# Patient Record
Sex: Female | Born: 1950
Health system: Southern US, Community
[De-identification: ages and names within clinical notes are randomized; demographics above are authoritative.]

## PROBLEM LIST (undated history)

## (undated) DIAGNOSIS — E7800 Pure hypercholesterolemia, unspecified: Secondary | ICD-10-CM

## (undated) DIAGNOSIS — M869 Osteomyelitis, unspecified: Secondary | ICD-10-CM

## (undated) DIAGNOSIS — Z9289 Personal history of other medical treatment: Secondary | ICD-10-CM

## (undated) DIAGNOSIS — R188 Other ascites: Secondary | ICD-10-CM

## (undated) DIAGNOSIS — I219 Acute myocardial infarction, unspecified: Secondary | ICD-10-CM

## (undated) DIAGNOSIS — B958 Unspecified staphylococcus as the cause of diseases classified elsewhere: Secondary | ICD-10-CM

## (undated) DIAGNOSIS — I739 Peripheral vascular disease, unspecified: Secondary | ICD-10-CM

## (undated) DIAGNOSIS — K219 Gastro-esophageal reflux disease without esophagitis: Secondary | ICD-10-CM

## (undated) DIAGNOSIS — I499 Cardiac arrhythmia, unspecified: Secondary | ICD-10-CM

## (undated) DIAGNOSIS — S42309A Unspecified fracture of shaft of humerus, unspecified arm, initial encounter for closed fracture: Secondary | ICD-10-CM

## (undated) DIAGNOSIS — M1990 Unspecified osteoarthritis, unspecified site: Secondary | ICD-10-CM

## (undated) DIAGNOSIS — K709 Alcoholic liver disease, unspecified: Secondary | ICD-10-CM

## (undated) DIAGNOSIS — K859 Acute pancreatitis without necrosis or infection, unspecified: Secondary | ICD-10-CM

## (undated) DIAGNOSIS — I10 Essential (primary) hypertension: Secondary | ICD-10-CM

## (undated) HISTORY — PX: ARTHROSCOPIC REPAIR ACL: SUR80

## (undated) HISTORY — PX: AMPUTATION: SHX166

## (undated) HISTORY — PX: LEG SURGERY: SHX1003

## (undated) HISTORY — PX: BUNIONECTOMY: SHX129

## (undated) HISTORY — DX: Unspecified fracture of shaft of humerus, unspecified arm, initial encounter for closed fracture: S42.309A

## (undated) HISTORY — DX: Acute pancreatitis without necrosis or infection, unspecified: K85.90

## (undated) HISTORY — PX: JOINT REPLACEMENT: SHX530

## (undated) HISTORY — PX: TOTAL HIP ARTHROPLASTY: SHX124

---

## 1978-04-18 HISTORY — PX: TUBAL LIGATION: SHX77

## 2000-04-20 ENCOUNTER — Emergency Department (HOSPITAL_COMMUNITY): Admission: EM | Admit: 2000-04-20 | Discharge: 2000-04-20 | Payer: Self-pay | Admitting: Emergency Medicine

## 2001-11-06 ENCOUNTER — Emergency Department (HOSPITAL_COMMUNITY): Admission: EM | Admit: 2001-11-06 | Discharge: 2001-11-07 | Payer: Self-pay | Admitting: Emergency Medicine

## 2002-08-16 ENCOUNTER — Encounter: Payer: Self-pay | Admitting: Orthopedic Surgery

## 2002-08-21 ENCOUNTER — Encounter: Payer: Self-pay | Admitting: Orthopedic Surgery

## 2002-08-21 ENCOUNTER — Inpatient Hospital Stay (HOSPITAL_COMMUNITY): Admission: RE | Admit: 2002-08-21 | Discharge: 2002-08-30 | Payer: Self-pay | Admitting: Orthopedic Surgery

## 2003-07-10 ENCOUNTER — Encounter: Admission: RE | Admit: 2003-07-10 | Discharge: 2003-07-10 | Payer: Self-pay | Admitting: *Deleted

## 2004-03-25 ENCOUNTER — Ambulatory Visit (HOSPITAL_BASED_OUTPATIENT_CLINIC_OR_DEPARTMENT_OTHER): Admission: RE | Admit: 2004-03-25 | Discharge: 2004-03-25 | Payer: Self-pay | Admitting: Orthopedic Surgery

## 2004-03-25 ENCOUNTER — Ambulatory Visit (HOSPITAL_COMMUNITY): Admission: RE | Admit: 2004-03-25 | Discharge: 2004-03-25 | Payer: Self-pay | Admitting: Orthopedic Surgery

## 2004-04-22 ENCOUNTER — Emergency Department (HOSPITAL_COMMUNITY): Admission: EM | Admit: 2004-04-22 | Discharge: 2004-04-22 | Payer: Self-pay | Admitting: Family Medicine

## 2005-09-05 ENCOUNTER — Ambulatory Visit: Payer: Self-pay | Admitting: Internal Medicine

## 2005-09-08 ENCOUNTER — Ambulatory Visit: Payer: Self-pay | Admitting: Internal Medicine

## 2005-09-08 ENCOUNTER — Other Ambulatory Visit: Admission: RE | Admit: 2005-09-08 | Discharge: 2005-09-08 | Payer: Self-pay | Admitting: Internal Medicine

## 2005-09-08 ENCOUNTER — Encounter: Payer: Self-pay | Admitting: Internal Medicine

## 2005-09-21 ENCOUNTER — Ambulatory Visit: Payer: Self-pay | Admitting: Gastroenterology

## 2005-10-03 ENCOUNTER — Ambulatory Visit: Payer: Self-pay

## 2005-10-03 ENCOUNTER — Encounter: Payer: Self-pay | Admitting: Cardiology

## 2005-10-03 ENCOUNTER — Ambulatory Visit: Payer: Self-pay | Admitting: Internal Medicine

## 2005-10-04 ENCOUNTER — Encounter: Payer: Self-pay | Admitting: Internal Medicine

## 2005-10-05 ENCOUNTER — Ambulatory Visit: Payer: Self-pay | Admitting: Gastroenterology

## 2005-10-05 ENCOUNTER — Encounter (INDEPENDENT_AMBULATORY_CARE_PROVIDER_SITE_OTHER): Payer: Self-pay | Admitting: Specialist

## 2005-10-05 ENCOUNTER — Encounter: Payer: Self-pay | Admitting: Internal Medicine

## 2005-11-23 ENCOUNTER — Encounter: Admission: RE | Admit: 2005-11-23 | Discharge: 2005-11-23 | Payer: Self-pay | Admitting: Orthopedic Surgery

## 2006-05-09 ENCOUNTER — Encounter: Admission: RE | Admit: 2006-05-09 | Discharge: 2006-05-09 | Payer: Self-pay | Admitting: Orthopedic Surgery

## 2006-06-19 ENCOUNTER — Encounter: Admission: RE | Admit: 2006-06-19 | Discharge: 2006-06-19 | Payer: Self-pay | Admitting: Orthopedic Surgery

## 2006-08-10 ENCOUNTER — Ambulatory Visit: Payer: Self-pay | Admitting: Internal Medicine

## 2007-01-02 ENCOUNTER — Encounter: Payer: Self-pay | Admitting: Internal Medicine

## 2007-01-05 ENCOUNTER — Encounter: Payer: Self-pay | Admitting: Internal Medicine

## 2007-01-23 ENCOUNTER — Encounter: Payer: Self-pay | Admitting: Internal Medicine

## 2007-02-07 ENCOUNTER — Ambulatory Visit: Payer: Self-pay | Admitting: Internal Medicine

## 2007-02-07 DIAGNOSIS — M1990 Unspecified osteoarthritis, unspecified site: Secondary | ICD-10-CM | POA: Insufficient documentation

## 2007-02-07 DIAGNOSIS — M810 Age-related osteoporosis without current pathological fracture: Secondary | ICD-10-CM

## 2007-02-07 DIAGNOSIS — E785 Hyperlipidemia, unspecified: Secondary | ICD-10-CM

## 2007-02-07 DIAGNOSIS — K219 Gastro-esophageal reflux disease without esophagitis: Secondary | ICD-10-CM

## 2007-02-07 DIAGNOSIS — R945 Abnormal results of liver function studies: Secondary | ICD-10-CM

## 2007-02-13 ENCOUNTER — Encounter: Payer: Self-pay | Admitting: Internal Medicine

## 2007-02-21 ENCOUNTER — Other Ambulatory Visit: Admission: RE | Admit: 2007-02-21 | Discharge: 2007-02-21 | Payer: Self-pay | Admitting: Internal Medicine

## 2007-02-21 ENCOUNTER — Encounter: Payer: Self-pay | Admitting: Internal Medicine

## 2007-02-21 ENCOUNTER — Ambulatory Visit: Payer: Self-pay | Admitting: Internal Medicine

## 2007-02-22 ENCOUNTER — Ambulatory Visit: Payer: Self-pay | Admitting: Cardiology

## 2007-03-01 LAB — CONVERTED CEMR LAB: Calcium, Total (PTH): 9.8 mg/dL (ref 8.4–10.5)

## 2007-03-02 ENCOUNTER — Encounter (INDEPENDENT_AMBULATORY_CARE_PROVIDER_SITE_OTHER): Payer: Self-pay | Admitting: *Deleted

## 2007-03-02 LAB — CONVERTED CEMR LAB
ALT: 51 units/L — ABNORMAL HIGH (ref 0–35)
AST: 178 units/L — ABNORMAL HIGH (ref 0–37)
BUN: 9 mg/dL (ref 6–23)
Basophils Relative: 0.3 % (ref 0.0–1.0)
CO2: 30 meq/L (ref 19–32)
Chloride: 102 meq/L (ref 96–112)
Creatinine, Ser: 0.6 mg/dL (ref 0.4–1.2)
Eosinophils Absolute: 0.1 10*3/uL (ref 0.0–0.6)
Eosinophils Relative: 2.2 % (ref 0.0–5.0)
GFR calc Af Amer: 134 mL/min
HCT: 42.1 % (ref 36.0–46.0)
HDL: 128.6 mg/dL (ref >39.0)
Lymphocytes Relative: 16.4 % (ref 12.0–46.0)
MCV: 104.3 fL — ABNORMAL HIGH (ref 78.0–100.0)
Monocytes Relative: 9.1 % (ref 3.0–11.0)
Neutrophils Relative %: 72 % (ref 43.0–77.0)
Platelets: 143 10*3/uL — ABNORMAL LOW (ref 150–400)
RBC: 4.04 M/uL (ref 3.87–5.11)
Sodium: 143 meq/L (ref 135–145)
Total CHOL/HDL Ratio: 2.7
WBC: 4.5 10*3/uL (ref 4.5–10.5)

## 2007-03-06 ENCOUNTER — Encounter: Payer: Self-pay | Admitting: Cardiology

## 2007-03-06 ENCOUNTER — Ambulatory Visit: Payer: Self-pay

## 2007-03-12 ENCOUNTER — Ambulatory Visit: Payer: Self-pay | Admitting: Cardiology

## 2007-03-20 ENCOUNTER — Ambulatory Visit: Payer: Self-pay

## 2007-03-20 LAB — CONVERTED CEMR LAB
AST: 60 units/L — ABNORMAL HIGH (ref 0–37)
Albumin: 3.8 g/dL (ref 3.5–5.2)
Alkaline Phosphatase: 99 units/L (ref 39–117)
Bilirubin, Direct: 0.1 mg/dL (ref 0.0–0.3)
Total Bilirubin: 0.7 mg/dL (ref 0.3–1.2)

## 2007-03-23 ENCOUNTER — Ambulatory Visit: Payer: Self-pay | Admitting: Cardiology

## 2007-03-23 LAB — CONVERTED CEMR LAB
ALT: 42 units/L — ABNORMAL HIGH (ref 0–35)
AST: 76 units/L — ABNORMAL HIGH (ref 0–37)
Alkaline Phosphatase: 115 units/L (ref 39–117)
Total Bilirubin: 0.7 mg/dL (ref 0.3–1.2)
Total Protein: 7.9 g/dL (ref 6.0–8.3)

## 2007-06-14 ENCOUNTER — Telehealth: Payer: Self-pay | Admitting: Internal Medicine

## 2007-07-13 ENCOUNTER — Encounter: Payer: Self-pay | Admitting: Internal Medicine

## 2007-11-07 ENCOUNTER — Telehealth: Payer: Self-pay | Admitting: Internal Medicine

## 2008-01-26 ENCOUNTER — Inpatient Hospital Stay (HOSPITAL_COMMUNITY): Admission: EM | Admit: 2008-01-26 | Discharge: 2008-01-30 | Payer: Self-pay | Admitting: Emergency Medicine

## 2008-01-26 ENCOUNTER — Ambulatory Visit: Payer: Self-pay | Admitting: Critical Care Medicine

## 2008-01-29 ENCOUNTER — Ambulatory Visit: Payer: Self-pay | Admitting: Infectious Diseases

## 2008-02-08 ENCOUNTER — Encounter: Payer: Self-pay | Admitting: Internal Medicine

## 2008-02-12 ENCOUNTER — Ambulatory Visit: Payer: Self-pay | Admitting: Internal Medicine

## 2008-02-13 ENCOUNTER — Ambulatory Visit: Payer: Self-pay | Admitting: Pulmonary Disease

## 2008-02-13 ENCOUNTER — Ambulatory Visit: Payer: Self-pay | Admitting: Internal Medicine

## 2008-02-19 ENCOUNTER — Telehealth (INDEPENDENT_AMBULATORY_CARE_PROVIDER_SITE_OTHER): Payer: Self-pay | Admitting: *Deleted

## 2008-02-19 LAB — CONVERTED CEMR LAB
Basophils Absolute: 0.1 10*3/uL (ref 0.0–0.1)
Basophils Relative: 2 % (ref 0.0–3.0)
Eosinophils Absolute: 0.1 10*3/uL (ref 0.0–0.7)
Lymphocytes Relative: 25.5 % (ref 12.0–46.0)
Monocytes Absolute: 2.5 10*3/uL — ABNORMAL HIGH (ref 0.1–1.0)
Monocytes Relative: 55.1 % — ABNORMAL HIGH (ref 3.0–12.0)
RBC: 3.67 M/uL — ABNORMAL LOW (ref 3.87–5.11)
RDW: 13.2 % (ref 11.5–14.6)

## 2008-03-10 ENCOUNTER — Ambulatory Visit: Payer: Self-pay | Admitting: Internal Medicine

## 2008-03-10 LAB — CONVERTED CEMR LAB
Bilirubin Urine: NEGATIVE
Glucose, Urine, Semiquant: NEGATIVE
Protein, U semiquant: 100
Specific Gravity, Urine: 1.015
Urobilinogen, UA: >=8
pH: 6.5

## 2008-03-11 ENCOUNTER — Ambulatory Visit: Payer: Self-pay | Admitting: Internal Medicine

## 2008-03-11 ENCOUNTER — Telehealth (INDEPENDENT_AMBULATORY_CARE_PROVIDER_SITE_OTHER): Payer: Self-pay | Admitting: *Deleted

## 2008-03-11 LAB — CONVERTED CEMR LAB
ALT: 16 units/L (ref 0–35)
Albumin: 3.7 g/dL (ref 3.5–5.2)
Amylase: 418 units/L — ABNORMAL HIGH (ref 27–131)
BUN: 14 mg/dL (ref 6–23)
Basophils Relative: 0 % (ref 0.0–3.0)
Eosinophils Absolute: 0 10*3/uL (ref 0.0–0.7)
Eosinophils Relative: 0.5 % (ref 0.0–5.0)
HCT: 39.8 % (ref 36.0–46.0)
Hemoglobin: 13.4 g/dL (ref 12.0–15.0)
Lipase: 204 units/L — ABNORMAL HIGH (ref 11.0–59.0)
MCV: 107.9 fL — ABNORMAL HIGH (ref 78.0–100.0)
Monocytes Absolute: 0.8 10*3/uL (ref 0.1–1.0)
Monocytes Relative: 9.2 % (ref 3.0–12.0)
Neutro Abs: 6.4 10*3/uL (ref 1.4–7.7)
Platelets: 141 10*3/uL — ABNORMAL LOW (ref 150–400)
Total Protein: 8.4 g/dL — ABNORMAL HIGH (ref 6.0–8.3)
WBC: 8.2 10*3/uL (ref 4.5–10.5)

## 2008-03-12 ENCOUNTER — Ambulatory Visit: Payer: Self-pay | Admitting: Cardiovascular Disease

## 2008-03-12 ENCOUNTER — Ambulatory Visit (HOSPITAL_COMMUNITY): Admission: RE | Admit: 2008-03-12 | Discharge: 2008-03-12 | Payer: Self-pay | Admitting: Internal Medicine

## 2008-03-12 ENCOUNTER — Telehealth: Payer: Self-pay | Admitting: Internal Medicine

## 2008-03-17 ENCOUNTER — Telehealth: Payer: Self-pay | Admitting: Internal Medicine

## 2008-03-17 ENCOUNTER — Ambulatory Visit: Payer: Self-pay | Admitting: Internal Medicine

## 2008-03-17 ENCOUNTER — Telehealth (INDEPENDENT_AMBULATORY_CARE_PROVIDER_SITE_OTHER): Payer: Self-pay | Admitting: *Deleted

## 2008-03-17 DIAGNOSIS — K859 Acute pancreatitis without necrosis or infection, unspecified: Secondary | ICD-10-CM | POA: Insufficient documentation

## 2008-03-17 LAB — CONVERTED CEMR LAB
Bilirubin Urine: NEGATIVE
Nitrite: NEGATIVE
Protein, U semiquant: NEGATIVE
Urobilinogen, UA: NEGATIVE

## 2008-03-18 ENCOUNTER — Encounter: Payer: Self-pay | Admitting: Internal Medicine

## 2008-03-18 ENCOUNTER — Encounter (INDEPENDENT_AMBULATORY_CARE_PROVIDER_SITE_OTHER): Payer: Self-pay | Admitting: *Deleted

## 2008-03-18 LAB — CONVERTED CEMR LAB
Bilirubin Urine: NEGATIVE
Hemoglobin, Urine: NEGATIVE
Ketones, ur: NEGATIVE mg/dL
Specific Gravity, Urine: 1.022 (ref 1.005–1.03)
Urobilinogen, UA: 1 (ref 0.0–1.0)

## 2008-03-21 ENCOUNTER — Telehealth (INDEPENDENT_AMBULATORY_CARE_PROVIDER_SITE_OTHER): Payer: Self-pay | Admitting: *Deleted

## 2008-03-21 LAB — CONVERTED CEMR LAB
ALT: 25 units/L (ref 0–35)
AST: 69 units/L — ABNORMAL HIGH (ref 0–37)
Amylase: 202 units/L — ABNORMAL HIGH (ref 27–131)
Total Bilirubin: 0.5 mg/dL (ref 0.3–1.2)
Total Protein: 7.5 g/dL (ref 6.0–8.3)

## 2008-04-03 ENCOUNTER — Ambulatory Visit: Payer: Self-pay | Admitting: Gastroenterology

## 2008-04-04 LAB — CONVERTED CEMR LAB
AST: 113 units/L — ABNORMAL HIGH (ref 0–37)
Albumin: 4 g/dL (ref 3.5–5.2)
Alkaline Phosphatase: 104 units/L (ref 39–117)
Cholesterol: 309 mg/dL (ref 0–200)
Direct LDL: 178 mg/dL
Total Protein: 8.1 g/dL (ref 6.0–8.3)
Triglycerides: 145 mg/dL (ref 0–149)
VLDL: 29 mg/dL (ref 0–40)

## 2008-04-08 ENCOUNTER — Telehealth (INDEPENDENT_AMBULATORY_CARE_PROVIDER_SITE_OTHER): Payer: Self-pay | Admitting: *Deleted

## 2008-04-25 ENCOUNTER — Telehealth: Payer: Self-pay | Admitting: Internal Medicine

## 2008-04-26 ENCOUNTER — Ambulatory Visit: Payer: Self-pay | Admitting: Internal Medicine

## 2008-04-26 ENCOUNTER — Inpatient Hospital Stay (HOSPITAL_COMMUNITY): Admission: EM | Admit: 2008-04-26 | Discharge: 2008-05-02 | Payer: Self-pay | Admitting: Emergency Medicine

## 2008-05-06 ENCOUNTER — Ambulatory Visit: Payer: Self-pay | Admitting: Internal Medicine

## 2008-05-20 ENCOUNTER — Ambulatory Visit: Payer: Self-pay | Admitting: Gastroenterology

## 2008-06-05 ENCOUNTER — Telehealth: Payer: Self-pay | Admitting: Gastroenterology

## 2008-06-10 ENCOUNTER — Telehealth (INDEPENDENT_AMBULATORY_CARE_PROVIDER_SITE_OTHER): Payer: Self-pay | Admitting: *Deleted

## 2008-06-12 ENCOUNTER — Ambulatory Visit: Payer: Self-pay | Admitting: Internal Medicine

## 2008-06-12 DIAGNOSIS — G5690 Unspecified mononeuropathy of unspecified upper limb: Secondary | ICD-10-CM | POA: Insufficient documentation

## 2008-06-17 ENCOUNTER — Telehealth (INDEPENDENT_AMBULATORY_CARE_PROVIDER_SITE_OTHER): Payer: Self-pay | Admitting: *Deleted

## 2008-06-17 LAB — CONVERTED CEMR LAB
ALT: 60 units/L — ABNORMAL HIGH (ref 0–35)
AST: 90 units/L — ABNORMAL HIGH (ref 0–37)
Alkaline Phosphatase: 108 units/L (ref 39–117)
Bilirubin, Direct: 0.1 mg/dL (ref 0.0–0.3)
Total Bilirubin: 0.9 mg/dL (ref 0.3–1.2)

## 2008-09-03 ENCOUNTER — Encounter: Payer: Self-pay | Admitting: Internal Medicine

## 2009-03-03 ENCOUNTER — Telehealth (INDEPENDENT_AMBULATORY_CARE_PROVIDER_SITE_OTHER): Payer: Self-pay | Admitting: *Deleted

## 2009-06-11 ENCOUNTER — Encounter (INDEPENDENT_AMBULATORY_CARE_PROVIDER_SITE_OTHER): Payer: Self-pay | Admitting: *Deleted

## 2009-07-23 ENCOUNTER — Telehealth (INDEPENDENT_AMBULATORY_CARE_PROVIDER_SITE_OTHER): Payer: Self-pay | Admitting: *Deleted

## 2009-08-17 ENCOUNTER — Ambulatory Visit: Payer: Self-pay | Admitting: Internal Medicine

## 2009-08-17 DIAGNOSIS — M4120 Other idiopathic scoliosis, site unspecified: Secondary | ICD-10-CM | POA: Insufficient documentation

## 2009-08-20 ENCOUNTER — Encounter: Payer: Self-pay | Admitting: Internal Medicine

## 2009-09-16 ENCOUNTER — Other Ambulatory Visit: Admission: RE | Admit: 2009-09-16 | Discharge: 2009-09-16 | Payer: Self-pay | Admitting: Internal Medicine

## 2009-09-16 ENCOUNTER — Ambulatory Visit: Payer: Self-pay | Admitting: Internal Medicine

## 2009-09-16 DIAGNOSIS — I10 Essential (primary) hypertension: Secondary | ICD-10-CM | POA: Insufficient documentation

## 2009-09-16 LAB — CONVERTED CEMR LAB
ALT: 23 units/L (ref 0–35)
AST: 53 units/L — ABNORMAL HIGH (ref 0–37)
Albumin: 3.8 g/dL (ref 3.5–5.2)
Alkaline Phosphatase: 189 units/L — ABNORMAL HIGH (ref 39–117)
BUN: 10 mg/dL (ref 6–23)
Basophils Absolute: 0.1 10*3/uL (ref 0.0–0.1)
Basophils Relative: 1.9 % (ref 0.0–3.0)
Bilirubin, Direct: 0.5 mg/dL — ABNORMAL HIGH (ref 0.0–0.3)
CO2: 30 meq/L (ref 19–32)
Calcium: 9.1 mg/dL (ref 8.4–10.5)
Chloride: 103 meq/L (ref 96–112)
Cholesterol: 242 mg/dL — ABNORMAL HIGH (ref 0–200)
Creatinine, Ser: 0.5 mg/dL (ref 0.4–1.2)
Direct LDL: 148.6 mg/dL
Eosinophils Absolute: 0.1 10*3/uL (ref 0.0–0.7)
Eosinophils Relative: 1.6 % (ref 0.0–5.0)
GFR calc non Af Amer: 131.91 mL/min (ref >60)
Glucose, Bld: 84 mg/dL (ref 70–99)
HCT: 41.1 % (ref 36.0–46.0)
HDL: 90.1 mg/dL (ref >39.00)
Hemoglobin: 14.2 g/dL (ref 12.0–15.0)
Lymphocytes Relative: 27.2 % (ref 12.0–46.0)
Lymphs Abs: 1.4 10*3/uL (ref 0.7–4.0)
MCHC: 34.6 g/dL (ref 30.0–36.0)
MCV: 116 fL — ABNORMAL HIGH (ref 78.0–100.0)
Monocytes Absolute: 1.5 10*3/uL — ABNORMAL HIGH (ref 0.1–1.0)
Monocytes Relative: 29.5 % — ABNORMAL HIGH (ref 3.0–12.0)
Neutro Abs: 2.1 10*3/uL (ref 1.4–7.7)
Neutrophils Relative %: 39.8 % — ABNORMAL LOW (ref 43.0–77.0)
Platelets: 166 10*3/uL (ref 150.0–400.0)
Potassium: 2.9 meq/L — ABNORMAL LOW (ref 3.5–5.1)
RBC: 3.55 M/uL — ABNORMAL LOW (ref 3.87–5.11)
RDW: 13.5 % (ref 11.5–14.6)
Sodium: 141 meq/L (ref 135–145)
TSH: 1.63 microintl units/mL (ref 0.35–5.50)
Total Bilirubin: 1.7 mg/dL — ABNORMAL HIGH (ref 0.3–1.2)
Total CHOL/HDL Ratio: 3
Total Protein: 7.7 g/dL (ref 6.0–8.3)
Triglycerides: 109 mg/dL (ref 0.0–149.0)
VLDL: 21.8 mg/dL (ref 0.0–40.0)
WBC: 5.2 10*3/uL (ref 4.5–10.5)

## 2009-09-18 ENCOUNTER — Telehealth (INDEPENDENT_AMBULATORY_CARE_PROVIDER_SITE_OTHER): Payer: Self-pay | Admitting: *Deleted

## 2009-09-21 ENCOUNTER — Ambulatory Visit: Payer: Self-pay | Admitting: Internal Medicine

## 2009-09-22 ENCOUNTER — Telehealth (INDEPENDENT_AMBULATORY_CARE_PROVIDER_SITE_OTHER): Payer: Self-pay | Admitting: *Deleted

## 2009-09-23 ENCOUNTER — Encounter: Payer: Self-pay | Admitting: Internal Medicine

## 2009-09-25 ENCOUNTER — Encounter (INDEPENDENT_AMBULATORY_CARE_PROVIDER_SITE_OTHER): Payer: Self-pay | Admitting: *Deleted

## 2009-09-25 LAB — CONVERTED CEMR LAB: Pap Smear: NEGATIVE

## 2009-10-02 ENCOUNTER — Encounter: Admission: RE | Admit: 2009-10-02 | Discharge: 2009-10-02 | Payer: Self-pay | Admitting: Internal Medicine

## 2009-10-08 ENCOUNTER — Telehealth (INDEPENDENT_AMBULATORY_CARE_PROVIDER_SITE_OTHER): Payer: Self-pay | Admitting: *Deleted

## 2009-10-26 ENCOUNTER — Telehealth (INDEPENDENT_AMBULATORY_CARE_PROVIDER_SITE_OTHER): Payer: Self-pay | Admitting: *Deleted

## 2009-11-30 ENCOUNTER — Encounter: Payer: Self-pay | Admitting: Internal Medicine

## 2009-11-30 ENCOUNTER — Encounter (INDEPENDENT_AMBULATORY_CARE_PROVIDER_SITE_OTHER): Payer: Self-pay | Admitting: *Deleted

## 2009-11-30 LAB — CONVERTED CEMR LAB
Glucose, Bld: 97 mg/dL
MCV: 107.9 fL
Platelets: 104 10*3/uL
WBC, blood: 5.7 10*3/uL

## 2009-12-03 ENCOUNTER — Encounter: Payer: Self-pay | Admitting: Internal Medicine

## 2010-01-08 ENCOUNTER — Encounter: Payer: Self-pay | Admitting: Internal Medicine

## 2010-01-22 ENCOUNTER — Encounter (INDEPENDENT_AMBULATORY_CARE_PROVIDER_SITE_OTHER): Payer: Self-pay | Admitting: *Deleted

## 2010-05-20 NOTE — Progress Notes (Signed)
Summary: Paperwork Status  Phone Note Other Incoming   Caller: Wilhemina Cash from [employer] of Call: Aurea Graff is calling checking on papework status for Aariah's power wheel chair Initial call taken by: Barnie Mort,  July 23, 2009 1:49 PM  Follow-up for Phone Call        Spoke with Aurea Graff informed the status is' pt has to have office visit and is scheduled on 08/10/09 with Dr Drue Novel .Kandice Hams  July 23, 2009 3:38 PM  Follow-up by: Kandice Hams,  July 23, 2009 3:38 PM

## 2010-05-20 NOTE — Letter (Signed)
Summary: Appointment - Missed  [facility]     [place], [place]    Phone:   Fax:      June 11, 2009 MRN: [id number]   [facility] [address] [place], [place]  [address]   Dear Ms. Motyka,  Our records indicate you missed your appointment on  06-09-2009  with  Dr. Antoine Poche  It is very important that we reach you to reschedule this appointment. We look forward to participating in your health care needs. Please contact us at the number listed above at your earliest convenience to reschedule this appointment.     Sincerely,       Lorne Skeens  [facility] Scheduling Team

## 2010-05-20 NOTE — Letter (Signed)
Summary: Cancer Screening/[employer] Personalized Risk Profile  Cancer Screening/[employer] Personalized Risk Profile   Imported By: Lanelle Bal 09/24/2009 12:27:31  _____________________________________________________________________  External Attachment:    Type:   Image     Comment:   External Document

## 2010-05-20 NOTE — Progress Notes (Signed)
Summary: abnormal labs, low vitamin D. Needs MRI  Phone Note Outgoing Call   Summary of Call:   LFTs slightly elevated in a obstructive pattern , different from previous increased LFTs chart is reviewed, she had a CT 04/2008: IMPRESSION:  Inflammatory changes and stranding in the central abdomen and surrounding the pancreas.  Findings are most consistent with acute  pancreatitis.    Patchy areas of low density within the hepatic parenchyma, probably  representing geographic fat infiltration.  There are focal areas of low density with a tubular configuration in the right hepatic lobe  and adjacent to the gallbladder.  The findings could represent additional areas of fat infiltration or focal biliary duct  dilatation.  These findings are indeterminate.  These liver  findings could be better evaluated with MRI.  PLAN, after an informal discussion with GI, I recommend: --schedule a MRI of abdomen and a MRCP , dx increased LFTs --  add a " carbohydrate deficient transferrin(CDT

## 2010-05-20 NOTE — Letter (Signed)
Summary: [facility]  [facility]   Imported By: Sherian Rein 12/25/2009 07:23:11  _____________________________________________________________________  External Attachment:    Type:   Image     Comment:   External Document  Appended Document: [facility] osteoprosis-- needs agresive RX, Forteo? also has abnormal, they were supposed to be faxed to [place] please get labs    Appended Document: [facility] Left message with [facility] to have labs faxed to [place].  Appended Document: [facility]  Waiting for labs from [facility], do you want [place] to rx forteo now or wait until you review labs? Army Fossa CMA  January 06, 2010 9:53 AM  wait until we can see the  labs  [place] E. Paz MD  January 11, 2010 11:41 AM   I called a requested labs from [facility] again. Army Fossa CMA  January 11, 2010 12:08 PM Labs on your ledge to review. Army Fossa CMA  January 11, 2010 2:22 PM  Labs reviewed CBC essentially at baseline Continue with the slight increase alkaline phosphate, recent MRI of the abdomen showed Smooth narrowing of the common duct, Pancreas was normal. Recommend: Recheck LFTs in 3 months Forteo prescription per [facility] let patient know  Jose E. Paz MD  January 21, 2010 1:55 PM      New/Updated Medications: FORTEO 600 MCG/2.4ML SOLN (TERIPARATIDE (RECOMBINANT)) daily  Pt states that she has been on the [facility] for 5 weeks now. Army Fossa CMA  January 21, 2010 2:32 PM

## 2010-05-20 NOTE — Miscellaneous (Signed)
Summary: labs from [facility] ortho   Clinical Lists Changes  Observations: Added new observation of ALK PHOS: 167 units/L (11/30/2009 11:07) Added new observation of SGOT (AST): 45 units/L (11/30/2009 11:07) Added new observation of GLUCOSE SER: 97 mg/dL (04/54/0981 19:14) Added new observation of CREATININE: 0.50 mg/dL (78/29/5621 30:86) Added new observation of SODIUM: 134 mmol/L (11/30/2009 11:07) Added new observation of PLATELETS: 104 10*3/mm3 (11/30/2009 11:07) Added new observation of MCV: 107.9 fL (11/30/2009 11:07) Added new observation of HGB: 15.9 g/dL (57/84/6962 95:28) Added new observation of WBC: 5.7 10*3/mm3 (11/30/2009 11:07)

## 2010-05-20 NOTE — Progress Notes (Signed)
Summary: labs  Phone Note Outgoing Call   Summary of Call: potassium is low, she is not on any medication that may account for that. she is asymptomatic Please call patient:  come back Monday for  a BMP--- Dx  hypokalemia Jose E. Paz MD  September 18, 2009 5:05 PM   Follow-up for Phone Call        spoke w/ patient aware of labs and appt scheduled for recheck on mon. also informed to eat plenty of potassium rich foods over the week........Marland KitchenDoristine Devoid  September 18, 2009 5:10 PM

## 2010-05-20 NOTE — Letter (Signed)
Summary: Revised Paperwork for [employer]  Revised Paperwork for [employer]   Imported By: Lanelle Bal 08/25/2009 12:07:46  _____________________________________________________________________  External Attachment:    Type:   Image     Comment:   External Document

## 2010-05-20 NOTE — Assessment & Plan Note (Signed)
Summary: CPX ---WILL EAT BREAKFAST BY 6AM, SO NEEDS FASTING LABS////SPH   Vital Signs:  Patient profile:   60 year old female Height:      60.5 inches Weight:      112 pounds Temp:     99.0 degrees F oral Pulse rate:   87 / minute Resp:     14 per minute BP sitting:   140 / 90  (left arm)  Vitals Entered By: Jeremy Johann CMA (September 16, 2009 1:44 PM) CC: yearly Comments --Fasting REVIEWED MED LIST, PATIENT AGREED DOSE AND INSTRUCTION CORRECT    History of Present Illness: yearly  , chart reviewed   Osteoporosis-- chart reviewed, last DEXA 2008, not on actonel (d/c by ortho, patient reports was d/c b/c it didn't help her osteoporosis)  Osteoarthritis-- no major problems w/ pain, scooter helps a lot   Hyperlipidemia-- on diet only   hypertension-- off BP meds x 5 days   Allergies (verified): No Known Drug Allergies  Past History:  Past Medical History: Reviewed history from 08/17/2009 and no changes required. Scoliosis Osteoporosis Osteoarthritis Hyperlipidemia GERD Pasteurella sepsis 10-09 pancreatitis 11-09  and 04-2008, suspect due to ETOH hypertension  Past Surgical History: Reviewed history from 08/17/2009 and no changes required. Tubal ligation born w/ a "dislocated hip" R hip surgery at 60 y/o L knee surgery at age 89 Hip replacement 2003 R 2 toes amputattion L foot metacarpal removal bunion surgery B R knee arthroscopy 2010  Family History: DM--no CAD-- bro colon ca--no breast ca--no ETOH: bro Lung ca-- M liver ca--bro kidney ca-- bro skin ca-- F Prostate Cancer: Father Stomach Cancer: Father    Social History: daughter of Baker Kogler, one of my patients Married (x 34 years in 2008) 2 kids Patient has never smoked.  Alcohol Use - yes,  "a little" Illicit Drug Use - no Patient gets regular exercise (swimming)  Review of Systems CV:  Denies chest pain or discomfort and swelling of feet. Resp:  Denies cough and shortness of  breath. GI:  Denies bloody stools, nausea, and vomiting. GU:  does SBE---> neg no vag d/c or bleed . Psych:  (+) stress , mom ill (cancer).  Physical Exam  General:  alert, well-developed, and well-nourished.   Neck:  no masses and no thyromegaly.   Breasts:  No mass, nodules, thickening, tenderness, bulging, retraction, inflamation, nipple discharge or skin changes noted.  no axillary adenopathy Lungs:  normal respiratory effort, no intercostal retractions, no accessory muscle use, and normal breath sounds.   Heart:  normal rate, regular rhythm, and no murmur.   Abdomen:  soft, non-tender, no distention, no masses, no guarding, and no rigidity.   Genitalia:  Normal introitus for age, no external lesions, clitoris unchanged from last exam no vaginal discharge, mucosa pink and moist, no vaginal or cervical lesions, no vaginal atrophy, no friaility or hemorrhage, small uterus size , normal position, no adnexal masses or tenderness Extremities:  no lower extremity edema   Impression & Recommendations:  Problem # 1:  HEALTH SCREENING (ICD-V70.0) Td -- aprox 2006 per patient  Cscope 10-2005--- normal MMG 5-10, neg ; due for a MMG, will wait for her to be notified  PAP 2008 neg, clitoris had an abnormal appearence, was refered toi gyn, they check and was told she was ok sending a Pap smear today Recommend self breast exam monthly    Problem # 2:  OSTEOPOROSIS (ICD-733.00)  chart reviewed, last DEXA 2008 not on actonel (d/c by ortho,  patient reports was d/c b/c it didn't help her osteoporosis) plan: recheck a DEXA, referal to rheumatology? Her updated medication list for this problem includes:    Calcium 600/vitamin D 600-400 Mg-unit Tabs (Calcium carbonate-vitamin d) .Marland Kitchen... Take 1 tablet by mouth two times a day  Orders: TLB-TSH (Thyroid Stimulating Hormone) (84443-TSH) T-Vitamin D (25-Hydroxy) (52841-32440) Radiology Referral (Radiology)  Problem # 3:  HYPERLIPIDEMIA  (ICD-272.4) labs . diet discussed  Labs Reviewed: SGOT: 90 (06/12/2008)   SGPT: 60 (06/12/2008)   HDL:110.0 (04/03/2008), 128.6 (02/21/2007)  LDL:DEL (04/03/2008), DEL (02/21/2007)  Chol:309 (04/03/2008), 344 (02/21/2007)  Trig:145 (04/03/2008), 140 (02/21/2007)  Orders: TLB-Lipid Panel (80061-LIPID)  Problem # 4:  LIVER FUNCTION TESTS, ABNORMAL (ICD-794.8) Assessment: Unchanged recheck  Orders: Venipuncture ([address]272) TLB-Hepatic/Liver Function Pnl (80076-HEPATIC)  Problem # 5:  ESSENTIAL HYPERTENSION (ICD-401.9) run out of medications 5 days ago RF meds Her updated medication list for this problem includes:    Metoprolol Succinate 25 Mg Xr24h-tab (Metoprolol succinate) .Marland Kitchen... 1 by mouth daily  BP today: 140/90 Prior BP: 124/78 (08/17/2009)  Labs Reviewed: K+: 3.6 (02/12/2008) Creat: : 0.6 (03/11/2008)   Chol: 309 (04/03/2008)   HDL: 110.0 (04/03/2008)   LDL: DEL (04/03/2008)   TG: 145 (04/03/2008)  Orders: TLB-BMP (Basic Metabolic Panel-BMET) (80048-METABOL) TLB-CBC Platelet - w/Differential (85025-CBCD)  Complete Medication List: 1)  Metoprolol Succinate 25 Mg Xr24h-tab (Metoprolol succinate) .Marland Kitchen.. 1 by mouth daily 2)  Calcium 600/vitamin D 600-400 Mg-unit Tabs (Calcium carbonate-vitamin d) .... Take 1 tablet by mouth two times a day 3)  Methocarbamol 500 Mg Tabs (Methocarbamol) .... Take 1 tab every 6 to 8 hours as needed  Patient Instructions: 1)  Please schedule a follow-up appointment in 6 months .  Prescriptions: METOPROLOL SUCCINATE 25 MG XR24H-TAB (METOPROLOL SUCCINATE) 1 by mouth daily  #90 x 3   Entered and Authorized by:   Nolon Rod. Teran Knittle MD   Signed by:   Nolon Rod. Cadan Maggart MD on 09/16/2009   Method used:   Electronically to        [facility]  [address] [id number]       [address]       [place], [place]  [address]       Ph: [phone] or [phone]       Fax: [phone]   RxID:   [id number]

## 2010-05-20 NOTE — Letter (Signed)
Summary: [employer]   Imported By: Lanelle Bal 08/24/2009 14:08:55  _____________________________________________________________________  External Attachment:    Type:   Image     Comment:   External Document

## 2010-05-20 NOTE — Progress Notes (Signed)
Summary: need labs (no answer 7/11)  Phone Note Outgoing Call   Summary of Call: advised patient-- the patient is still need labs LFTs ( full LFTs not just AST ALT) and GGT  dx abnormal liver function tests  Jose E. Paz MD  October 26, 2009 8:40 AM    Follow-up for Phone Call        Tried calling pt,  no answer. Army Fossa CMA  October 26, 2009 10:41 AM   Additional Follow-up for Phone Call Additional follow up Details #1::        Tried to call patient but her voicemail can not receive messages Additional Follow-up by: Lavell Islam,  October 27, 2009 2:08 PM    Additional Follow-up for Phone Call Additional follow up Details #2::    mailed letter.Karoline Caldwell Negrete  October 30, 2009 8:59 AM

## 2010-05-20 NOTE — Letter (Signed)
Summary: Results Follow up Letter   at [place]  [address] [place], [place] [address]   Phone: [phone]  Fax: [phone]    09/25/2009 MRN: [id number]  [facility] [address] [place], [place]  [address]  Dear Ms. Distler,  The following are the results of your recent test(s):  Test         Result    Pap Smear:        Normal __X___  Not Normal _____ Comments: ______________________________________________________ Cholesterol: LDL(Bad cholesterol):         Your goal is less than:         HDL (Good cholesterol):       Your goal is more than: Comments:  ______________________________________________________ Mammogram:        Normal _____  Not Normal _____ Comments:  ___________________________________________________________________ Hemoccult:        Normal _____  Not normal _______ Comments:    _____________________________________________________________________ Other Tests:Please see attached normal pap smear    We routinely do not discuss normal results over the telephone.  If you desire a copy of the results, or you have any questions about this information we can discuss them at your next office visit.   Sincerely,

## 2010-05-20 NOTE — Progress Notes (Signed)
Summary: MRI result, repeat lab  Phone Note Outgoing Call   Call placed by: [employer] CMA,  October 08, 2009 5:30 PM Details for Reason: MRI discuss with radiology The narrowing of the common duct is very subtle, not really obstructive. no need for further MRIs unless something changes Plan: Advised patient, MRI is essentially okay please come back next week for a repeated LFTs ( full LFTs not just AST ALT) and GGT   Summary of Call: Tried to call pt mail box full tried work # was told wrong # will try home # again tomorrow......Marland KitchenFelecia Deloach CMA  October 08, 2009 5:32 PM   Follow-up for Phone Call        Advised pt of MRI result.  Lab appt scheduled for 10/12/09 @ 10:20.  Mervin Kung CMA  October 09, 2009 8:51 AM

## 2010-05-20 NOTE — Assessment & Plan Note (Signed)
Summary: ROB--TO GO PAPER WORK//PH   Vital Signs:  Patient profile:   60 year old female Height:      61 inches Weight:      113.4 pounds BMI:     21.50 Pulse rate:   64 / minute BP sitting:   124 / 78  Vitals Entered By: Shary Decamp (Aug 17, 2009 2:57 PM) CC: needs paperwork completed for mobility assistive equipment   History of Present Illness: needs paperwork for a scooter    Current Medications (verified): 1)  Metoprolol Succinate 25 Mg Xr24h-Tab (Metoprolol Succinate) .Marland Kitchen.. 1 By Mouth Daily 2)  Calcium 600/vitamin D 600-400 Mg-Unit Tabs (Calcium Carbonate-Vitamin D) .... Take 1 Tablet By Mouth Two Times A Day 3)  Hydrocodone .... Per Ortho 4)  Muscle Relaxer .... Per Ortho  Allergies (verified): No Known Drug Allergies  Past History:  Past Medical History: Scoliosis Osteoporosis Osteoarthritis Hyperlipidemia GERD Pasteurella sepsis 10-09 pancreatitis 11-09  and 04-2008, suspect due to ETOH hypertension  Past Surgical History: Tubal ligation born w/ a "dislocated hip" R hip surgery at 60 y/o L knee surgery at age 58 Hip replacement 2003 R 2 toes amputattion L foot metacarpal removal bunion surgery B R knee arthroscopy 2010  Social History: Reviewed history from 05/20/2008 and no changes required. Married (x 34 years in 2008) 2 kids Patient has never smoked.  Alcohol Use - yes  occasional social Illicit Drug Use - no Patient gets regular exercise.  Review of Systems       the patient has a long history for scoliosis, also OA, status-post multiple orthopedic procedures. Most recently she is having R knee pain and left leg numbness. She was prescribed a brace for her left leg but it  is very uncomfortable to use. until recently, her husband was helping her quite a bit but due to his own health issues he is not able to help her that much anymore.   She is unable to walk but a few steps before she is to stop due to pain,  this is limiting her  ADL  Physical Exam  General:  alert, well-developed, and well-nourished.   Lungs:  normal respiratory effort, no intercostal retractions, no accessory muscle use, and normal breath sounds.   Heart:  normal rate, regular rhythm, and no murmur.   Msk:  moderate to severe scoliosis noted, gait difficultted by scoliosis Extremities:  no edema   Impression & Recommendations:  Problem # 1:  SCOLIOSIS (ICD-737.30) I think she will benefit from a scooter due to her  scoliosis, OA paper work completed  Problem # 2:  OSTEOARTHRITIS (ICD-715.90) see #1  Problem # 3:   face-to-face more than 15  minutes completing a detailed  paperwork with the assistance of the patient  Complete Medication List: 1)  Metoprolol Succinate 25 Mg Xr24h-tab (Metoprolol succinate) .Marland Kitchen.. 1 by mouth daily 2)  Calcium 600/vitamin D 600-400 Mg-unit Tabs (Calcium carbonate-vitamin d) .... Take 1 tablet by mouth two times a day 3)  Hydrocodone  .... Per ortho 4)  Muscle Relaxer  .... Per ortho  Patient Instructions: 1)  came back for your physical at your convinience

## 2010-08-02 LAB — DIFFERENTIAL
Basophils Relative: 0 % (ref 0–1)
Eosinophils Absolute: 0 10*3/uL (ref 0.0–0.7)
Eosinophils Absolute: 0 10*3/uL (ref 0.0–0.7)
Eosinophils Relative: 0 % (ref 0–5)
Eosinophils Relative: 1 % (ref 0–5)
Lymphocytes Relative: 13 % (ref 12–46)
Lymphs Abs: 0.7 10*3/uL (ref 0.7–4.0)
Monocytes Absolute: 0.4 10*3/uL (ref 0.1–1.0)
Monocytes Relative: 8 % (ref 3–12)
Monocytes Relative: 9 % (ref 3–12)
Neutrophils Relative %: 80 % — ABNORMAL HIGH (ref 43–77)

## 2010-08-02 LAB — POCT I-STAT, CHEM 8
HCT: 43 % (ref 36.0–46.0)
Hemoglobin: 14.6 g/dL (ref 12.0–15.0)
Potassium: 3.2 mEq/L — ABNORMAL LOW (ref 3.5–5.1)
Sodium: 136 mEq/L (ref 135–145)
TCO2: 23 mmol/L (ref 0–100)

## 2010-08-02 LAB — COMPREHENSIVE METABOLIC PANEL
ALT: 20 U/L (ref 0–35)
AST: 44 U/L — ABNORMAL HIGH (ref 0–37)
Albumin: 2.7 g/dL — ABNORMAL LOW (ref 3.5–5.2)
Albumin: 3.3 g/dL — ABNORMAL LOW (ref 3.5–5.2)
Albumin: 3.5 g/dL (ref 3.5–5.2)
Alkaline Phosphatase: 86 U/L (ref 39–117)
BUN: 14 mg/dL (ref 6–23)
BUN: 7 mg/dL (ref 6–23)
CO2: 23 mEq/L (ref 19–32)
Calcium: 8.5 mg/dL (ref 8.4–10.5)
Calcium: 9 mg/dL (ref 8.4–10.5)
Calcium: 9.4 mg/dL (ref 8.4–10.5)
Chloride: 98 mEq/L (ref 96–112)
Creatinine, Ser: 0.57 mg/dL (ref 0.4–1.2)
Creatinine, Ser: 0.59 mg/dL (ref 0.4–1.2)
Creatinine, Ser: 0.64 mg/dL (ref 0.4–1.2)
GFR calc Af Amer: >60 mL/min (ref >60)
GFR calc non Af Amer: >60 mL/min (ref >60)
Glucose, Bld: 50 mg/dL — ABNORMAL LOW (ref 70–99)
Potassium: 3.7 mEq/L (ref 3.5–5.1)
Sodium: 138 mEq/L (ref 135–145)
Total Protein: 6 g/dL (ref 6.0–8.3)

## 2010-08-02 LAB — CBC
HCT: 36.2 % (ref 36.0–46.0)
HCT: 38.7 % (ref 36.0–46.0)
Hemoglobin: 12.4 g/dL (ref 12.0–15.0)
MCHC: 33.9 g/dL (ref 30.0–36.0)
MCHC: 34 g/dL (ref 30.0–36.0)
MCHC: 34.3 g/dL (ref 30.0–36.0)
MCV: 108.7 fL — ABNORMAL HIGH (ref 78.0–100.0)
MCV: 109.1 fL — ABNORMAL HIGH (ref 78.0–100.0)
Platelets: 80 10*3/uL — ABNORMAL LOW (ref 150–400)
Platelets: 83 10*3/uL — ABNORMAL LOW (ref 150–400)
Platelets: 92 10*3/uL — ABNORMAL LOW (ref 150–400)
RBC: 3.8 MIL/uL — ABNORMAL LOW (ref 3.87–5.11)
RDW: 17.7 % — ABNORMAL HIGH (ref 11.5–15.5)
WBC: 5.1 10*3/uL (ref 4.0–10.5)
WBC: 6.3 10*3/uL (ref 4.0–10.5)

## 2010-08-02 LAB — AMYLASE: Amylase: 149 U/L — ABNORMAL HIGH (ref 27–131)

## 2010-08-02 LAB — POCT CARDIAC MARKERS
CKMB, poc: <1 ng/mL — ABNORMAL LOW (ref 1.0–8.0)
Myoglobin, poc: 41 ng/mL (ref 12–200)

## 2010-08-02 LAB — LIPASE, BLOOD
Lipase: 1112 U/L — ABNORMAL HIGH (ref 11–59)
Lipase: 87 U/L — ABNORMAL HIGH (ref 11–59)

## 2010-08-31 NOTE — Discharge Summary (Signed)
[name], Kathryn [name]                   ACCOUNT NO.:  [id number]   MEDICAL RECORD NO.:  [id number]          PATIENT TYPE:  INP   LOCATION:  3705                         FACILITY:  [facility]   PHYSICIAN:  Valerie A. Felicity Coyer, MDDATE OF BIRTH:  07-27-1950   DATE OF ADMISSION:  04/26/2008  DATE OF DISCHARGE:  05/02/2008                               DISCHARGE SUMMARY   PRIMARY CARE PHYSICIAN:  Willow Ora, MD   DISCHARGE DIAGNOSIS:  Acute pancreatitis likely secondary to ethyl  alcohol use with no evidence of gallstones on CT.   HISTORY OF PRESENT ILLNESS:  [name] is a 60 year old white female  with past medical history of hypertension, hypertriglycerides, and daily  EtOH use who presented to [facility] on day of admission with  complaints of back pain, abdominal pain, and nausea and vomiting.  Of  note, the patient has been seen frequently by primary care physician of  recent due to similar symptoms and thought secondary to pancreatitis.  The patient does report she drinks approximately 2 drinks of vodka every  night.  Evaluation in the ER revealed a lipase of 1112.  The patient was  admitted at that time for further evaluation and treatment.   PAST MEDICAL HISTORY:  1. Hypertension.  2. 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  3. BTL in 1982.  4. Amputation of third left toe in 1998.  5. Daily EtOH use.  6. Hypertriglyceridemia.  7. Fatty liver disease.  8. Osteoporosis.  9. Osteoarthritis.  10.GERD.   COURSE OF HOSPITALIZATION:  Acute pancreatitis.  Again, the patient's  symptoms thought likely secondary to daily EtOH use, although the  patient does deny problem and refuses any help stating that it had been  a problem for her to quit drinking.  A CT of the abdomen and pelvis was  obtained consistent with acute pancreatitis, negative for free fluid in  the pelvis and negative for any evidence of gallstones.  The patient has  been advanced from clear  diet to regular diet without any recurrent  nausea or vomiting.  She does have some mild abdominal pain, however,  well controlled with narcotic pain medications.  The patient is to  continue PPI at the time of discharge as well as continue bland diet  until complete resolution of symptoms in regards to daily EtOH use.  The  patient has been instructed to abstain completely from alcohol.  She  showed no signs of withdrawal during this hospitalization.   MEDICATIONS AT THE TIME OF DISCHARGE:  1. Omeprazole 20 mg p.o. daily.  2. Oxycodone/APAP 5/325 one tablet p.o. q.6 h. p.r.n. pain.  3. Phenergan 12.5 mg 1 tablet p.o. q.8 h. p.r.n. nausea.  4. Metoprolol 12.5 mg p.o. daily.  5. Calcium 2 tablets p.o. daily.   DISPOSITION:  The patient felt medically stable for discharge home at  this time, as she is tolerating regular diet.  The patient is instructed  to follow up her primary care physician, Dr. Willow Ora on Tuesday May 06, 2008, at 10:40 a.m. for further discussion  on alcohol cessation as  well as possible referral to Gastroenterology.      Cordelia Pen, NP      Raenette Rover. Felicity Coyer, MD  Electronically Signed    LE/MEDQ  D:  05/02/2008  T:  05/02/2008  Job:  [id number]   cc:   Willow Ora, MD

## 2010-08-31 NOTE — Assessment & Plan Note (Signed)
[facility]                               LIPID CLINIC NOTE   NAME:[name], [name]                          MRN:          [id number]  DATE:03/12/2007                            DOB:          06-Mar-1951    The patient is seen in the lipid clinic for further evaluation and  medication titration associated with hyperlipidemia in the setting of  elevated liver enzymes.   PAST MEDICAL HISTORY:  1. Fatty liver disease.  2. Osteoporosis.  3. Osteoarthritis.  4. Hyperlipidemia.  5. Gastroesophageal reflux disease.   She recently received a prescription for simvastatin 40 mg one daily at  bedtime which she only took for one evening before she received a call  from Dr. Leta Jungling office that she should discontinue due to her increased  LFTs.  As a result currently her only medication is calcium with D 600  mg twice daily.   The patient has no known drug allergies.   SOCIAL HISTORY:  Pertinent for only 2nd hand smoke.  No previous use of  tobacco.  She states that she drinks on average 2 alcoholic beverages  each week to assist her with sleep.  Her diet is pretty good with  selections of lean meats that are often grilled, lots of salads, every  once in a while she will go to the [employer] for fettuccini with  cream sauce or fast food but she uses Smart Balance.  She uses oils only  sparingly and selects low fat milk, juice, or flavored water for  beverages.  She does some upper body exercise on a regular basis but has  been instructed by Dr. Drue Novel in the past to limit impact due to her hip  replacement and pain as well as her multiple lower extremity surgeries  and toe amputations.  She states that she does enjoy swimming but pool  access is limited and unaffordable at this time as she is on disability  and her husband is unemployed.  It appears that the patient's financial  barriers are her most significant impairment at this time.   FAMILY HISTORY:  With a  1st degree relative being her brother who had an  MI 27 years ago.   VITAL SIGNS:  Her weight today is 116 pounds.  Blood pressure is 146/82.  Her heart rate is 88.  Her respirations are 16.   LABORATORY:  On February 21, 2007, revealed a total cholesterol 344,  triglycerides 140, HDL 128.6, LDL 190.4.  ALT elevated at 51, AST  elevated at 178, alkaline phosphatase slightly elevated at 123.   It is of note that the patient has seen Dr. Jarold Motto in the past who  diagnosed fatty liver.   ASSESSMENT:  The patient is a 60 year old female referred for  hyperlipidemia in the setting of primary prevention.  Certainly she has  very significantly elevated AST and chronic liver abnormalities noted in  her standard panel.   PLAN:  1. I have spoken on the telephone with Dr. Vania Rea. Jarold Motto, who has  suggested that a thiazolidinedione such as Actos is appropriate in      this patient.  That there are studies that have been published most      recently in series in Gastroenterology that have demonstrated LFTs      improvement and histologic improvement associated with use of      thiazolidinedione in a fatty liver.  I have discussed this with the      patient and have agreed that I will obtain the citation, review it,      and then make a recommendation from there.  2. In the meantime, I have asked the patient to keep a 3-day food      diary, work to decrease her milk content from 2% to 1%, decrease      her breakfast meats, and switch from ground beef to low fat ground      [place].  3. I have asked her to discontinue her alcohol use completely in the      period as we begin to follow up on her liver function tests.  4. She will consider [employer] membership for pool access.  5. She will follow up with labs on December 1,2 008, and follow up      with me on March 23, 2007.   I appreciate the opportunity to see this pleasant patient.      Shelby Dubin, PharmD, BCPS, CPP  Electronically  Signed      Rollene Rotunda, MD, [facility]  Electronically Signed   MP/MedQ  DD: 03/13/2007  DT: 03/13/2007  Job #: [id number]   cc:   Willow Ora, MD  Rollene Rotunda, MD, [facility]

## 2010-08-31 NOTE — Discharge Summary (Signed)
NAMEIlean, Kathryn Kathryn                   ACCOUNT NO.:  [id number]   MEDICAL RECORD NO.:  [id number]          PATIENT TYPE:  INP   LOCATION:  5038                         FACILITY:  [facility]   PHYSICIAN:  Oretha Milch, MD      DATE OF BIRTH:  03-Sep-1950   DATE OF ADMISSION:  01/26/2008  DATE OF DISCHARGE:  01/30/2008                               DISCHARGE SUMMARY   DISCHARGE DIAGNOSES:  1. Pasturella bacteremia and uvulitis.  2. Hypokalemia.   HISTORY OF PRESENT ILLNESS:  Ms. Kathryn Kathryn is a 60 year old white female  who presented to the office and was seen by nurse practitioner, Tammy  Parrett, with sore throat and swollen uvula. She was admitted for  further evaluation and treatment of suspected uvulitis and rule out  retropharyngeal abscess.   LABORATORY DATA:  Is currently not available. Of note, her potassium was  noted to be 3.3, and she required potassium supplement prior to  discharge.   HOSPITAL COURSE BY DISCHARGE DIAGNOSES:  1. Pasturella bacteremia. Pasturella bacteremia was on blood cultures.      She had an ID consult with Dr. Sampson Goon. She was treated      initially with IV antibiotics and was changed to ciprofloxacin 500      mg b.i.d. at discharge to complete 10 days of antimicrobial      therapy. Her uvulitis improved. Her neck area was thoroughly      examined by Dr. Lazarus Salines with no further interventions. He will      follow up with the outpatient dose. She did have a second set of      blood cultures drawn prior to discharge.  2. Hypokalemia. It was repleted with 40 mEq prior to discharge.   DISCHARGE MEDICATIONS:  1. Metoprolol 12.5 mg 2 times a day.  2. Ibuprofen 800 mg q.i.d. p.r.n.  3. Ciprofloxacin 500 mg 1 tablet 2 times a day until gone.   She has got a follow-up appointment with Rubye Oaks, nurse  practitioner, on October 28 at 10 a.m. She was also instructed to follow  up with Dr. Lazarus Salines to continue examination of her throat. She should be  on a  heart health regular diet. She is being discharged in improved  condition.      Devra Dopp, MSN, ACNP      Oretha Milch, MD  Electronically Signed    SM/MEDQ  D:  01/31/2008  T:  01/31/2008  Job:  [id number]   cc:   Gloris Manchester. Lazarus Salines, M.D.  Mick Sell, MD

## 2010-08-31 NOTE — H&P (Signed)
NAMEMalaijah, [name]                   ACCOUNT NO.:  [id number]   MEDICAL RECORD NO.:  [id number]          PATIENT TYPE:  INP   LOCATION:  1827                         FACILITY:  [facility]   PHYSICIAN:  Donalynn Furlong, MD      DATE OF BIRTH:  07/13/1950   DATE OF ADMISSION:  04/26/2008  DATE OF DISCHARGE:                              HISTORY & PHYSICAL   PRIMARY CARE Aubryana Vittorio:  Dr. Willow Ora with  HealthCare   CHIEF COMPLAINT:  Nausea, vomiting, abdominal pain in epigastric region  with radiation to the back.   HISTORY OF PRESENT ILLNESS:  Ms. Burrough is a 60 year old Caucasian female  who lives in [place] with her husband.  She presented to [facility] tonight with a complaint of back pain which started about 2  days ago in the thoracic vertebral region.  She also complained that  initial back pain converted into the abdominal pain starting from her  lower chest wall to upper part of her abdomen in the epigastric region.  This abdominal pain is constant.  It is deep and associated with nausea  and vomiting which started today in the morning at 3:00 a.m.  It was  persistent for about 6 hours; then, the patient went to sleep, woke up  at around noontime.  She had food which again she threw up.  She decided  to present to the ER because she continued to have these symptoms.  She  denied any fever, chills, diarrhea, constipation, blood in the stool,  cough, sputum production, chest pain.  No history of coronary artery  disease or any sore throat, malaise, headache, urinary complaints at  this time.  Interestingly, the patient does have a history of alcohol  use every night.  She drinks 2 drinks of vodka every night.  She does  not smoke, and she does not have any illicit drug use.   PAST MEDICAL HISTORY:  1. Controlled hypertension on metoprolol.  2. Right hip replacement due to a history of congenital dysplasia of      right hip.  3. Bilateral tubal ligation in 1982.  4.  Amputation of third left toe in 1998.  5. Left knee surgery in 1965.  6. Left lower extremity neuropathy.  7. Daily alcohol use.  8. Hypertriglyceridemia.  9. Fatty liver disease.  10.Osteoporosis.  11.Osteoarthritis.  12.Gastroesophageal reflux disease.   PAST SURGICAL HISTORY:  As above.   FAMILY HISTORY:  She lives with her husband, and she does have daily  alcohol use, no tobacco use, but she is a second-hand smoker.  She has 2  grandsons, and she is disabled due to her hip dysplasia.   FAMILY HISTORY:  Father died at age 54 due to gastric cancer with  metastasis.  She has a brother who died at age 65 due to liver and  kidney cancer.   REVIEW OF SYSTEMS:  Positive as per HPI, otherwise negative.  Review of  systems done for 14 systems.   PHYSICAL EXAMINATION:  VITAL SIGNS:  Blood pressure 122/89, pulse 96,  respiration  15, temperature 98.7 on presentation in the ER with normal  oxygen saturation on room air.  GENERAL EXAM:  Alert, oriented x3, not in acute distress, sitting up in  the bed.  CARDIOVASCULAR:  S1-S2 regular.  No murmur, rub or gallop.  LUNGS:  Clear to auscultation bilaterally.  No wheezing, rhonchi,  crackles.  ABDOMEN:  There is severe tenderness in epigastric and bilateral upper  quadrants and in the periumbilical area.  She does have some tenderness  over the back also on both sides.  Due to tenderness, I could not  palpate any deep organs, no rebound or guarding, no abdominal muscle  rigidity noted on exam.  EXTREMITIES:  No clubbing, cyanosis or edema.  Pulses palpable in all 4  extremities.  HIPS:  The patient does have a right hip arthroplasty incision and  healed wound present.  LOWER EXTREMITIES:  She does have missing toes on her left foot due to  the previous surgery.  HEAD:  Normocephalic, nontraumatic.  Eyes:  Pupils are equally reactive  to light and accommodation.  Extraocular muscles are intact.  No  jaundice.  NECK:  No thyromegaly or  JVD.  ORAL CAVITY:  Oral mucosa moist.  No thrush noted.  The patient does  have dental caries.  No sore throat noted.  SKIN:  No rash or bruits.  NEUROLOGICAL:  Exam shows intact cranial nerves, muscular strength and  sensation.   LABORATORIES:  EKG with lateral T-wave abnormalities.  One set of  troponin is negative.  Chest x-ray, portable, is without any remarkable  cardiopulmonary disease.  Lipase 1112.  Comprehensive metabolic panel  normal except potassium 3.2, chloride 99, glucose 104, albumin 44, total  bilirubin 2.2.  CBC with differential shows platelet of 92, hemoglobin  4.1, WBC 5.1.  CT scan of abdomen and pelvis with and without contrast  is pending.   ASSESSMENT AND PLAN:  1. Acute pancreatitis most likely secondary to daily alcohol use      and/or hypertriglyceridemia (on recent lipid profile) and less      likely gallstone.  2. Hypokalemia  3. Controlled hypertension.  4. History of daily alcohol use.  5. Hypertriglyceridemia.  6. Gastroesophageal reflux disease.  7. Osteoporosis.  8. Congenital hip dysplasia on right side with right total hip      arthroplasty.   PLAN:  Will admit the patient on telemetry bed with a diagnosis of acute  pancreatitis under Dr. Rene Paci.  Primary care Ramatoulaye Pack is Dr.  Willow Ora.  The patient is full code.  The patient will be n.p.o. except  water, ice chips and medicines.  Will check vitals, input/output every 8  hours.  The patient will get CBC with differential, CMP, amylase, lipase  in the morning.  Will start IV normal saline at 75 mL per hour.  We will  replace her potassium with IV potassium 40 mEq over 4 hours.  Will give  her p.r.n. IV morphine for pain, p.r.n. IV Zofran and p.o. Phenergan for  nausea, vomiting if needed.  The patient will get CT abdomen and pelvis  with contrast at this time with antinausea medicine prior to the  contrast drinking.  We will continue home metoprolol at 12.5 mg p.o.  b.i.d. doses.   The patient is advised to quit drinking alcohol as it may  exacerbate her pancreatitis due to the alcohol use.  The  patient may need gastroenterology workup, but right now, the patient is  stable, and we will  continue to follow her.  The patient does not  require any other interventions like antibiotics at this time.  We will  provide her GI prophylaxis with Nexium p.o. and SCDs on the legs for DVT  prophylaxis.      Donalynn Furlong, MD  Electronically Signed     TVP/MEDQ  D:  04/26/2008  T:  04/26/2008  Job:  [id number]   cc:   Vikki Ports A. Felicity Coyer, MD  Willow Ora, MD

## 2010-08-31 NOTE — H&P (Signed)
NAMEAnaid, Haney Lara                   ACCOUNT NO.:  [id number]   MEDICAL RECORD NO.:  [id number]          PATIENT TYPE:  INP   LOCATION:  1824                         FACILITY:  [facility]   PHYSICIAN:  Charlcie Cradle. Delford Field, MD, FCCPDATE OF BIRTH:  April 18, 1951   DATE OF ADMISSION:  01/26/2008  DATE OF DISCHARGE:                              HISTORY & PHYSICAL   CHIEF COMPLAINT:  Can't swallow.   HISTORY OF PRESENT ILLNESS:  This is a 60 year old white female, a  primary care patient of Dr. Drue Novel, with known history of degenerative  joint disease and controlled hypertension who presented to the emergency  room this morning for a sudden onset of severe sore throat and  difficulty swallowing.  The patient reports that she woke up suddenly  about 2 a.m. and had a severe sore throat with difficulty swallowing,  felt like that she has secretions that were pooling and has associated  nausea and has to frequently spit up foamy secretions.  The patient  reports she was in good state of health yesterday prior to going to bed  without any known illness.  She denied any recent fever, rash, neck  pain, stiff neck, or dyspnea.  The patient reports she did have a  pneumonia, which she was treated with outpatient antibiotics and reports  that she recovered nicely.  She has had no recent illness that she knows  of.  The patient's husband drove her to the emergency room this morning.  The patient was found to have a substantially swollen uvula.  Critical  Care was called for consultation due to the patient's potential for  upper airway involvement.  She will be admitted to ICU for further  evaluation.   Past medical history is severe degenerative joint disease with previous  dysplastic hip with leg length discrepancy.  She did undergo surgery in  1955, with previous revisions in the past.  Also had a left mid toe  amputation secondary to osteomyelitis in 1998.  Reports she has some  left leg neuropathy secondary  to a previous hip surgeries and is  currently disabled due to this.  She has had a tubal ligation.  Some  mild anemia.  Hypertension with some palpitations and tachycardia,  controlled on metoprolol.   CURRENT MEDICATIONS:  Metoprolol 25 mg twice daily.   DRUG ALLERGIES:  No known drug allergies.   SOCIAL HISTORY:  The patient is a never smoker.  She drinks alcohol  daily.  Two vodka drinks.  She is married.  She has two grandsons.  She  is currently disabled at home due to her hip dysplasia.   FAMILY HISTORY:  Father died at age 57 due to gastric cancer with  metastatic disease.  She had a brother who died at age 48 due to liver  and kidney cancer.   REVIEW OF SYSTEMS:  Negative for rash, neck stiffness, recent illness,  abdominal pain, chest pain, or leg swelling.  No recent travel.  No  known exposing to flu virus and last antibiotic was 3 months ago for  pneumonia.  PHYSICAL EXAMINATION:  GENERAL:  The patient is a well-developed, well-  nourished female.  VITAL SIGNS:  Temperature 101.4, heart rate 100, blood pressure 174/98,  and O2 saturation 100% on room air.  HEENT:  Normocephalic and nontraumatic.  PERRLA.  TMs are normal.  EACs  are clear.  Nasal mucosa is pale with some clear nasal discharge.  Posterior pharynx has a large edematous, erythematous uvula with tilt to  the left.  Breath has a foul odor.  NECK:  Supple with some bilateral lymphadenopathy.  LUNGS:  Sounds are clear to auscultation bilaterally.  CARDIAC:  S1 and S2 without murmur, rub, or gallop.  ABDOMEN:  Soft and nontender.  Bowel sounds are positive throughout all  four quadrants.  No guarding or rebound noted.  EXTREMITIES:  Warm  without any calf tenderness, cyanosis, clubbing, or edema.   LABORATORY DATA:  Blood cultures are pending.  Soft tissue of neck shows  a mild-to-moderate prevertebral soft tissue swelling with mild amount of  ill-defined lucency with suspected probable tonsillitis and  possible  retropharyngeal abscess.   IMPRESSION AND PLAN:  Uvulitis suspected infectious source.  Questionable epiglottitis versus tonsillitis versus retropharyngeal  abscess.  Currently, the patient's airway is stable and the patient is  able to handle her secretions.  We will begin broad-spectrum antibiotics  with clindamycin and Rocephin IV.  She may have Zofran for nausea.  ENT  has been contacted and has agreed to evaluate this patient.  Her labs  with blood cultures are pending.      Rubye Oaks, NP      Charlcie Cradle Delford Field, MD, [facility]  Electronically Signed    TP/MEDQ  D:  01/26/2008  T:  01/26/2008  Job:  [id number]

## 2010-08-31 NOTE — Assessment & Plan Note (Signed)
[facility]                            CARDIOLOGY OFFICE NOTE   NAME:Kathryn [name], Kathryn [name]                          MRN:          [id number]  DATE:02/22/2007                            DOB:          [birth year]    REASON FOR CONSULTATION:  Evaluate patient with tachycardia.   HISTORY OF PRESENT ILLNESS:  The patient is a pleasant, 60 year old  white female without a prior cardiac history.  She was noted, over the  last year, to have rapid heart rate.  This has apparently been sinus  tachycardia.  She does not think that prior to this she had this.  She  has had multiple surgeries and was never told during any of those that  she had tachycardia.  She does not feel this.  In particular, she does  not have palpitations, presyncope, or syncope.  She is active.  She can  swim.  She has not been doing walking because of orthopedic problems.  However, with her level of activity which includes lots of swimming in  the summer, she has not noticed any decline in her exercise tolerance,  excessive heart rate, palpitations, presyncope, chest discomfort, neck  or arm discomfort.  She does not have any shortness of breath.  She  denies any PND or orthopnea.  She did have labs drawn recently.  These  are still pending.   Of note, the patient did have an echocardiogram in 2007 that  demonstrated no significant abnormalities except some mild focal basal  septal hypertrophy.   PAST MEDICAL HISTORY:  1. Multiple orthopedic surgeries (hip replacement, toe amputation,      bunion surgery, metacarpal bone removal).  2. Hyperlipidemia (an LDL drawn yesterday was 190.4).   ALLERGIES:  None.   MEDICATIONS:  1. Actonel.  2. Calcium.  3. Ibuprofen.   SOCIAL HISTORY:  The patient is disabled.  She has been married for 34  years.  She has 2 children, 2 grandchildren, and 1 on the way.  She does  not smoke cigarettes or drink alcohol.   FAMILY HISTORY:  Contributory for a  brother with a myocardial infarction  at age 4.  He had exacerbating factors, apparently.  He died at 68 of  cancer.   REVIEW OF SYSTEMS:  As stated in the HPI and otherwise negative for  other systems except for chronic hip, leg, and foot pain.   PHYSICAL EXAMINATION:  The patient is thin and in no distress.  Blood pressure 140/100, heart rate 103 and regular, weight 115 pounds,  body mass index 22.  HEENT:  Eyelids unremarkable, pupils are equal, round, and reactive to  light, fundi not visualized, there is some questionable exophthalmos.  Oral mucosa unremarkable.  NECK:  No jugular venous distension at 45 degrees, carotid upstroke  brisk and symmetrical.  No bruit.  No thyromegaly.  LYMPHATICS:  No cervical, axillary, inguinal adenopathy.  LUNGS:  Clear to auscultation bilaterally.  BACK:  No costovertebral angle tenderness.  CHEST:  Unremarkable.  HEART:  PMI not displaced or sustained.  S1 and S2 are within normal  limits.  No S3, no S4.  No clicks, no rubs, no murmurs.  ABDOMEN:  Flat, positive bowel sounds, normal in frequency and pitch.  No bruits, no rebound, no guarding.  No midline pulsatile mass.  No  hepatomegaly, no splenomegaly.  SKIN:  No rashes, no nodules.  EXTREMITIES:  2+ pulses throughout, no edema.  No cyanosis, no clubbing.  NEURO:  Oriented to person, place, and time.  Cranial nerves II-XII  grossly intact, motor grossly intact.   EKG sinus rhythm, right axis deviation, early transitional lead, V1 and  V2, anterior T-wave inversions consistent with ischemia but no old EKGs  for comparison, mild QT prolongation.   ASSESSMENT AND PLAN:  1. Tachycardia:  The patient does have a sinus tachycardia.  However,      she is not having any symptoms related to this.  She does not have      any symptoms consistent with hyperthyroidism, but she may have some      exophthalmos.  Her TSH is pending and I will await these results.      I am going to get an  echocardiogram to make sure she has a      structurally normal heart which I suspect by her physical exam.  I      am going to apply a 24-hour Holter monitor to make sure she has a      normal diurnal variation with her heart rate.  2. Hypertension:  Blood pressure is borderline.  She is going to get a      blood pressure cuff and I encouraged this.  She will keep a blood      pressure diary and I instructed her on this.  3. Dyslipidemia.  I have taken the liberty of prescribing simvastatin      40 mg daily.  She will need liver and lipids in 8 weeks.  We      discussed diet and she does think she keeps a pretty reasonable      diet.  4. Followup:  I will see the patient back after the results of the      above.     Rollene Rotunda, MD, [facility]  Electronically Signed    JH/MedQ  DD: 02/22/2007  DT: 02/23/2007  Job #: [id number]   cc:   Willow Ora, MD

## 2010-08-31 NOTE — Assessment & Plan Note (Signed)
[facility]                            CARDIOLOGY OFFICE NOTE   NAME:[name], [name]                          MRN:          [id number]  DATE:03/23/2007                            DOB:          March 19, 1951    PRIMARY CARE PHYSICIAN:  Willow Ora, M.D.   REASON FOR PRESENTATION:  Evaluate patient with tachycardia.   HISTORY OF PRESENT ILLNESS:  This is the second appointment for this  patient with tachycardia.  She wore a Holter monitor and was noted to  have a persistent tachycardia with rates of about 100.  She also had  some episodes of a premature ectopic complex bigeminy, but predominantly  seemed to be just a persistent tachycardia that did not demonstrate the  normal diurnal variation.  Her baseline EKG shows a mildly abnormal P-  wave axis.  It is not entirely clear that this is a sinus tachycardia or  perhaps a re-entrant tachycardia.  I discussed it with Dr. Graciela Husbands.   The patient does not notice any tachy palpitations.  She knows her heart  rate is high.  She does not have any shortness of breath, denies any PND  or orthopnea.  She has no chest discomfort, neck or arm discomfort.  Of  note, she did have an echocardiogram, which demonstrated some mild focal  septal hypertrophy, but was otherwise normal.   Patient has had some mildly elevated liver enzymes.  She was being  followed by the lipid clinic and was told to stop her lipid therapy.  She does drink alcohol and was actually told to stop this, as well.  She  is going to have liver enzymes repeated.   PAST MEDICAL HISTORY:  Multiple orthopedic surgeries (hip replacement,  toe amputation, bunion surgery, metacarpal bone removal), mildly  elevated liver enzymes, dyslipidemia (LDL was 190.4), tachycardia.   ALLERGIES:  None.   CURRENT MEDICATIONS:  Calcium, Actonel.   REVIEW OF SYSTEMS:  As stated in the HPI, and otherwise negative for  other systems.   PHYSICAL EXAMINATION:  The patient is  in no acute distress.  Blood  pressure 144/105, heart rate 94 and regular.  Weight 115 pounds, body  mass index 22.  HEENT:  Eyelids unremarkable.  Pupils equal, round and reactive to  light.  Fundi not visualized.  Oral mucosa unremarkable.  NECK:  No jugular venous distention, wave form within normal limits.  Carotid upstroke brisk and symmetric.  No bruits, no thyromegaly.  LYMPHATICS:  No adenopathy.  LUNGS:  Clear to auscultation bilaterally.  BACK:  No costovertebral angle tenderness.  CHEST:  Unremarkable.  HEART:  PMI not displaced or sustained.  S1 and S2 within normal limits.  No S3, no S4, no clicks, no rubs, no murmurs.  ABDOMEN:  Flat, positive bowel sounds, normal in frequency and pitch.  No bruits, no rebound, no guarding, no midline pulsatile mass, no  hepatomegaly, no splenomegaly.  SKIN:  No rashes, no nodules.  EXTREMITIES:  Two-plus pulses, no edema, no cyanosis, no clubbing.  NEUROLOGIC:  Oriented to person, place and time.  Cranial nerves  II  through XII grossly intact.  Motor grossly intact.   ASSESSMENT AND PLAN:  1. Tachycardia:  Today, I hooked her up and did a rhythm strip with      deep, exaggerated respiration.  She does have respiratory      variation, which would argue against this being a re-entrant      arrhythmia.  At this point, I think we can watch this and, again, I      have discussed this with Dr. Graciela Husbands.  We will draw a TSH, as we have      not gotten one from Dr. Drue Novel.  We will be on the look-out for      tachycardia-induced cardiomyopathy, however, though I do not      suspect this will be a problem.  2. Hypertension:  Today I am going to tell her she has hypertension,      as two readings have been elevated.  I am going to give her      actually Toprol 25 mg daily, extended release.  This will help with      her heart rate a little bit, as well as her blood pressure.  3. Dyslipidemia:  She is off the statin right now.  We are going to       follow liver enzymes and reinitiate therapy when we are able.  4. Followup:  We will see her back in six months, at which point I      will probably get another echocardiogram.  She will also continue      to be followed in the lipid clinic.     Rollene Rotunda, MD, [facility]  Electronically Signed    JH/MedQ  DD: 03/23/2007  DT: 03/23/2007  Job #: [id number]   cc:   Willow Ora, MD

## 2010-08-31 NOTE — Consult Note (Signed)
NAMEJoyell, Emami Dajanay                   ACCOUNT NO.:  [id number]   MEDICAL RECORD NO.:  [id number]          PATIENT TYPE:  INP   LOCATION:  1824                         FACILITY:  [facility]   PHYSICIAN:  Karol T. Lazarus Salines, M.D. DATE OF BIRTH:  [birth year]   DATE OF CONSULTATION:  01/26/2008  DATE OF DISCHARGE:                                 CONSULTATION   CHIEF COMPLAINT:  Severe sore throat.   HISTORY:  A 60 year old white female felt fine yesterday.  She awakened  in the wee hours this morning with fever, severe sore throat, and then  developed a sense of something in her throat and strong gagging.  She  threw up multiple times.  She was brought to the emergency room for  evaluation.  Dr. Oletta Lamas diagnosed uvulitis and began Rocephin and  clindamycin.  She has never had anything like this before.  She is not  taking ACE inhibitors.  She is unable to swallow because of the gag.  She is breathing okay thus far and her voice is also okay.  She does not  smoke.   In the emergency room, white count was 6400.  A lateral soft tissue of  the neck was read as showing swollen uvula, possible retropharyngeal  abscess.  I reviewed these films and the uvula is definitely swollen.  The epiglottis is normal.  She has significant cervical osteophytes.  She has some thickening of the prevertebral shadow below the level of  the larynx which I feel is consistent with the osteophytes.  She has  essentially no significant swelling in the upper retropharyngeal space.  She has normal cervical lordosis.   PAST MEDICAL HISTORY:  No known allergies.  She takes metoprolol for  blood pressure.  She has significant osteoarthritis.   SOCIAL HISTORY:  Not reviewed.  She does not smoke, but does drink  alcohol.   FAMILY HISTORY:  Noncontributory.   REVIEW OF SYSTEMS:  No chest pain.  No diarrhea.  She has not been  exposed to anyone with flu to her knowledge.  She has had her  appropriate adult vaccinations.   PHYSICAL EXAMINATION:  GENERAL:  This is a thin, middle-aged white  female appearing older than the stated age of 36.  She is slightly  distressed appearing.  She is gagging and coughing/vomiting with some  frequency.  Mental status is otherwise basically appropriate.  She hears  well in conversational speech.  Voice is phonatory and clear.  RESPIRATORY:  Respirations unlabored through the nose and the mouth.  HEENT:  The head is atraumatic and neck supple.  Cranial nerves intact.  Ear canals are clear with normal aerated drums.  Anterior nose is moist  and non-congested.  Oral cavity is moist with teeth in fair-to-good  repair and no lesions.  Oropharynx shows a fiery red uvula which is  substantially swollen.  No evidence of abscess or asymmetry.  The  remainder of the oropharynx does not look to be bulging or with exudate  or erythema.  NECK:  Tender in the left jugulodigastric region, but no discrete  adenopathy.   Following 5 mL of 1% viscous Xylocaine to the nose, the flexible  laryngoscope was introduced through the left side.  The nasopharynx is  clear with normal eustachian tori.  Oropharynx shows the posterior  surface of the uvula to be once again fiery red, but no evidence of  abscess or exudate.  The base of tongue is clear.  The supraglottic  larynx including epiglottis shows slight swelling in the left arytenoid  region.  No pooling in valleculae or piriformis.  Vocal cords are normal  and mobile.  Airway is good.   IMPRESSION:  Acute uvulitis.  This is not epiglottitis.  I do not think  she has a retropharyngeal abscess.  Given the rapid onset, this could be  Haemophilus influenzae with a lesser possibility of streptococcus or  staphylococcus.  I am not sufficiently familiar with H1N1 to comment as  to whether this could be a possible etiology.  Incidentally, her MCV on  admission is 107, and she may be B12 deficient.   PLAN:  I discussed this with Ms. Clent Ridges, nurse  practitioner on the  critical care team.  I sent a throat smear for culture and Gram stain.  I agree with the coverage with Rocephin and clindamycin.  I will give  her a single dose of Decadron 8 mg.  I think she is okay to have an  occasional ice chip, and we will also use Chloraseptic Spray to attempt  to blunt the gag reflex.  I anticipate that she will have a fairly quick  clinical recovery.  I agree with admission to the stepdown unit or the  intensive care unit for observation for possible airway deterioration,  although I doubt this will occur.      Gloris Manchester. Lazarus Salines, M.D.  Electronically Signed     KTW/MEDQ  D:  01/26/2008  T:  01/26/2008  Job:  [id number]   cc:   Charlcie Cradle. Delford Field, MD, FCCP

## 2010-09-03 NOTE — Op Note (Signed)
NAME:  Kathryn [name], Kathryn [name]                             ACCOUNT NO.:  [id number]   MEDICAL RECORD NO.:  [id number]                   PATIENT TYPE:  INP   LOCATION:  5040                                 FACILITY:  [facility]   PHYSICIAN:  Thera Flake., M.D.             DATE OF BIRTH:  1950-06-14   DATE OF PROCEDURE:  08/21/2002  DATE OF DISCHARGE:                                 OPERATIVE REPORT   PREOPERATIVE DIAGNOSES:  1. Severe osteoarthritis of right hip with previous history of congenital     hip dislocation.  2. Flexion contracture of hip.   POSTOPERATIVE DIAGNOSES:  1. Severe osteoarthritis of right hip with previous history of congenital     hip dislocation.  2. Flexion contracture of hip.   PROCEDURE:  1. Right total hip replacement (S-ROM stem 16 x 11 with 30 standard neck,     16B large proximal stem, acetabulum with the DePuy Pinnacle 48 mm     acetabulum with a 28 mm head size, a 10 degree liner, Pinnacle).  2. Release of capsule and iliopsoas tendon.   SURGEON:  Dyke Brackett, M.D.   ASSISTANTS:  Jerolyn Shin. Tresa Res, M.D., and Legrand Pitts. Duffy, P.A.   ESTIMATED BLOOD LOSS:  Approximately 750.   DESCRIPTION OF PROCEDURE:  A posterior approach to the hip was made with  careful protection of the sciatic nerve.  The patient had a significant  contracture relative to previous congenital hip problems.  Care was made not  to over-lengthen, really the minimal amount of lengthening was done to be  able to allow the prosthesis to be placed.  The native acetabulum was  deficient, particularly anteriorly, with a very large oblong acetabulum and  a very malformed head.  The head was essentially cut at the level of the  lesser trochanter with care being made to find the central area of the  canal, the canal progressively reamed up initially for an AML stem and then  changed due to anteversion issues to the S-ROM.  The acetabulum was exposed  and deepened.  An attempt was made to place  the acetabular orientation in a  little bit more normal anatomic alignment relative to anteversion.  Fortunately, the posterior wall was not deficient.  A very thick inner  table, and it required reaming up to a 47 mm reamer to accept a 48 mm cup.  The trial cup was placed, followed by the real cup with the 10 degree liner.  Attention was next directed at the femur.  It was obvious due to length  issues that we would have to use the S-ROM system as well as the  anteversion.  The shortest standard neck length of 30 was used with the 16  stem and a 16B large proximal sleeve that was placed just short of the  uppermost portion of the femur.  Trial reduction was  carried out with about  25 degrees of anteversion.  The stability was deemed to be excellent.  There  was some slight tendency for impingement anteriorly, so the anterior bone  along the acetabular edge was trimmed back, which relieved the impingement  nicely.  The hip was moderately tight due to the congential issues, and this  required extensive capsulectomy as well as complete release of the  iliopsoas.  The iliopsoas was exerting a tethering band influence relative  to keeping the hip flexed.  There was probably about a 25-30 degree flexion  contracture.  Again, the final components were inserted with the appropriate  amount of anteversion referenced above.  Stability was deemed to be  excellent and copious  irrigation then carried out.  Closure of the capsule was effected with #1  Ethibond, #1 running Ethibond and a follow-up on the closure of the  subcutaneous tissue with 0 and 2-0 Vicryl, skin clips, Marcaine with  epinephrine in the skin, a lightly sterile dressing applied.  Back to the  recovery room.                                               Thera Flake., M.D.    WDC/MEDQ  D:  08/21/2002  T:  08/21/2002  Job:  [id number]

## 2010-09-03 NOTE — Discharge Summary (Signed)
NAME:  Kathryn [name], Kathryn [name]                             ACCOUNT NO.:  [id number]   MEDICAL RECORD NO.:  [id number]                   PATIENT TYPE:  INP   LOCATION:  5040                                 FACILITY:  [facility]   PHYSICIAN:  Dyke Brackett, M.D.                 DATE OF BIRTH:  [birth year]   DATE OF ADMISSION:  08/21/2002  DATE OF DISCHARGE:  08/30/2002                                 DISCHARGE SUMMARY   ADMISSION DIAGNOSES:  1. Right hip dysplasia with osteoarthritis.  2. History of left middle toe amputation due to osteomyelitis.   DISCHARGE DIAGNOSES:  1. Osteoarthritis due to right hip dysplasia status post right total hip     arthroplasty.  2. Acute blood loss anemia secondary to surgery.  3. Hematoma/cellulitis right hip.  4. Constipation, now resolved.  5. History of left middle toe amputation due to osteomyelitis.   SURGICAL PROCEDURE:  On Aug 21, 2002 she underwent a right total hip  arthroplasty by Dr. Marcie Mowers, assisted by Dr. Vear Clock and  Arnoldo Morale, P.A.-C.  She had a Pinnacle 100-series acetabular cup placed  size 48 mm; a Pinnacle acetabular lip liner internal diameter 28 mm, outside  diameter 48 mm; an Apex full eliminator; a femoral stem size standard, 16 x  11 x 150, 30 standard neck; an S-ROM femoral head 28 mm + 0 1113 cone; and  then an S-ROM total hip system 16B large porous coated, fits a 16 x 11 stem.   COMPLICATIONS:  None.   CONSULTS:  1. Pharmacy consult for Coumadin therapy Aug 21, 2002.  2. Physical therapy, occupational therapy consult, and case management     consult Aug 22, 2002.   HISTORY OF PRESENT ILLNESS:  This 60 year old white female patient was born  with a dysplastic right hip.  She did have surgery for it in 1955.  She did  well until five years prior to admission when she developed pain in her hip.  The pain is now constant and pressure-like and is getting progressively  worse.  The hip pops and clicks, sometimes catches  and causes her to fall.  She cannot walk any long distances or climb stairs.  Conservative measures  have failed to relieve her pain and because of that she is presenting for a  right hip replacement.   HOSPITAL COURSE:  The patient tolerated her surgical procedure well without  immediate postoperative complications.  She was subsequent transferred to  5000.  On postoperative day #1 complained of mild lightheadedness.  Tmax was  101.1, pulse 108, blood pressure 90/55.  Her hemoglobin was 7.4 with  hematocrit of 21.1 and she was subsequent transfused with 2 units of packed  red blood cells with Lasix between the units.  She had received 2 units of  blood intraoperatively and postoperatively due to a large EBL.  The patient  was started on therapy per protocol.   Postoperative day #2, Tmax was 102.1, vital signs were stable.  Right hip  incision well approximated with staples.  Her hemoglobin improved to 10.2  with hematocrit of 29.  PT was 20.3 with an INR of 1.9.  She was switched to  p.o. pain medications.  Mild hypokalemia was corrected with p.o. potassium  and this was monitored.   Postoperative day #3, Tmax was 100.3.  Hemoglobin 9.2, hematocrit 27.  She  did have a large amount of serosanguinous drainage from her right hip wound;  also, tape blisters were noted.  This was treated with Neosporin, K-pad to  the hip, and she was started on Keflex due to the increased drainage and  development of a hematoma.   Over the next several days she continued to have a large amount of drainage  from her hip.  The area was very red.  She was continued on antibiotics and  her discharge was held to monitor this.  Her Coumadin was also held for  several days because her INR got up to 3.  It was felt this might be  contributing to the hematoma.   She did extremely well with therapy over the next several days.  Her  drainage and temperature seemed to decrease.  Finally, on Aug 30, 2002 it is   decreased to just a minimal amount of serous drainage.  Tmax was 98.8 and  vital signs are stable.  She is ready for discharge home and will be  discharged home later today.   DIET:  She can resume her regular prehospitalization diet.   MEDICATIONS:  She can resume her preoperative medications with the exception  of the Vicodin.  This includes ferrous sulfate 325 mg one tablet p.o. [name].i.d.   Additional medications include:  1. Coumadin - she is to take as directed by the home healthcare pharmacy.  2. Keflex 500 mg one tablet p.o. q.i.d. for five days #20 with no refill.  3. OxyContin 10 mg two tablets p.o. q.12h. for five days and then one tablet     p.o. q.12h. for seven days, #34 with no refill.  4. Percocet 5/325 mg one to two p.o. q.4h. p.r.n. for pain, #45 with no     refill.  5. Robaxin 500 mg one to two p.o. q.6h. p.r.n. for spasms, #40 with no     refill.   ACTIVITY:  She is to be out of bed, touchdown weightbearing on the right leg  with the use of the walker.  This is to be continued for four to six weeks.  Please see the blue total hip replacement posterior discharge instruction  sheet that was sent home with the patient for further directions on  activity.   WOUND CARE:  She needs to have a dry dressing change to that right hip daily  and p.r.n. any drainage from the hip.  It can be cleansed with Betadine once  a day.  Home health nurse will teach her and her husband how to do that.   SPECIAL INSTRUCTIONS:  She is to notify Dr. Madelon Lips of temperature greater  than 101.5, chills, pain unrelieved by pain medicine, or foul-smelling  drainage from the wound.   FOLLOW-UP:  She needs to follow up with Dr. Madelon Lips in our office on  Tuesday, Sep 03, 2002 and needs to call [phone] for that appointment.   Please see the blue preprinted discharge sheet for any further discharge  instructions.  LABORATORY DATA:  Chest x-ray taken August 16, 2002 showed no evidence of acute  disease.  X-ray taken of her pelvis and right hip on May 5 showed good  position and alignment following right hip replacement.   On April 30 hemoglobin was 12.7, hematocrit 36.9.  On May 5 at 11:30  hemoglobin was 7, hematocrit 20.  On May 6 hemoglobin was 7.4, hematocrit  21.1, platelets 112.  On May 7 hemoglobin was 10.2, hematocrit 29, platelets  125.  On May 8 hemoglobin was 9.2, hematocrit 27, and platelets 128.  On May  13 hemoglobin was 9.8, hematocrit 29, and platelets 320.   On April 30 PT was 12.1, INR 0.8.  On May 10 PT was 26.6, INR 3.0.  On May  13 PT was 20.4, INR 1.9.  On May 14 PT was 19.2, INR 1.8.   May 5 potassium was 3.2 with glucose 117.  May 6 BUN was 4, creatinine 0.7,  calcium 7.8.  On May 7 sodium was 134, potassium 3.2, glucose 118, BUN 4,  creatinine 0.6, calcium 8.2.  On May 8 glucose was 121, BUN 3, creatinine  0.6, calcium 8.  All other laboratory studies were within normal limits.     Legrand Pitts Marshall Cork, M.D.    KED/MEDQ  D:  08/30/2002  T:  08/31/2002  Job:  [id number]

## 2010-09-03 NOTE — Op Note (Signed)
NAMEHanako [name], Kathryn [name]                   ACCOUNT NO.:  [id number]   MEDICAL RECORD NO.:  [id number]          PATIENT TYPE:  AMB   LOCATION:  DSC                          FACILITY:  [facility]   PHYSICIAN:  Nadara Mustard, MD     DATE OF BIRTH:  [birth year]   DATE OF PROCEDURE:  03/25/2004  DATE OF DISCHARGE:                                 OPERATIVE REPORT   PREOPERATIVE DIAGNOSIS:  1.  Clawing of the left foot second and fourth toes.  2.  Hallux valgus deformity, left great toe.   POSTOPERATIVE DIAGNOSIS:  1.  Clawing of the left foot second and fourth toes.  2.  Hallux valgus deformity, left great toe.   PROCEDURE PERFORMED:  1.  Chevron osteotomy, first metatarsal.  2.  Aiken osteotomy, first proximal phalanx.  3.  Wilde osteotomy, second metatarsal.  4.  Wilde osteotomy, four metatarsal.   SURGEON:  Nadara Mustard, MD   ANESTHESIA:  Popliteal block plus LMA.   ESTIMATED BLOOD LOSS:  Minimal.   ANTIBIOTICS:  One gram Kefzol.   TOURNIQUET TIME:  Esmarch at the ankle for approximately 45 minutes.   DISPOSITION:  PACU in stable condition.   INDICATIONS FOR PROCEDURE:  The patient is a 60 year old woman who is status  post a third toe amputation, has had fixed clawing of the second and fourth  toes.  She has also developed a progressive hallux valgus deformity and  presents at this time for surgical intervention after failure of  conservative care.  The risks and benefits were discussed including  infection, neurovascular injury, persistent pain, recurrence of deformity,  need for additional surgery.  The patient states he understands and wishes  to proceed at this time.   PROCEDURE IN DETAIL:  The patient was brought to OR room 1 after undergoing  a popliteal block.  The patient then underwent an LMA general anesthetic.  After adequate level of anesthesia was obtained the patient's left lower  extremity was prepped using DuraPrep and draped into a sterile field.  The  leg was  elevated and the Esmarch was wrapped around the ankle for tourniquet  control.  A longitudinal midline incision was made over the great toe  MCP  joint.  This was carried down through the retinaculum in a football shape of  the retinaculum with ellipse out plantarly.  An ostectomy was performed off  the medial imminence.  A Chevron osteotomy was then performed and the  metatarsal head was shifted laterally 4 mm.  This construct was stable, was  held secure with a 0.625 K-wire and again, the medial eminence was resected.  The patient still had persistent valgus deformity of the proximal phalanx  and a Aiken osteotomy was performed at the base of the proximal phalanx.  This straightened the toe and this was again held stable with a 0.625 K-  wire.  The wound was irrigated and the retinaculum was closed using running  2-0 Vicryl.   Attention was then focused on the second toe.  A dorsal incision was made.  The extensor tendons  were cut.  A Wilde osteotomy was performed and this was  held stable with a 0.265 K-wire.  The patient still had persistent  dislocation of the proximal phalanx dorsal to the metatarsal head of the  second toe and the base of the second toe proximal phalanx was resected.  This stabilized and aligned the second toe.   Attention was then focused on the fourth toe.  A dorsal incision was made  over the MTP joint of the fourth toe.  This was carried down and the  extensor tendons were released.  A Wilde osteotomy was performed and both  Wilde osteotomies were held stable with a 12 mm spin screw.  Both the second  toe and fourth toe had good alignment after the Wilde osteotomies.  The  space at the third toe amputation site was closed down and there was no over-  lapping of the toes.  The Esmarch was released after 45 minutes.  Hemostasis  was obtained.  The skin was then closed using a 3-0 nylon vertical mattress  suture.  There was no tension on the skin.  The wounds were  covered with  Adaptic orthopedic sponges, sterile Webril and Coban dressing.  Pin caps  were applied on the two 0.625 K-wires.   The patient was taken to PACU in stable condition.  Plans for postoperative  shoe, crutches, nonweight-bearing on the left.  Prescription for Vicodin.  Followup in the office in two weeks.      [name]   MVD/MEDQ  D:  03/25/2004  T:  03/26/2004  Job:  [id number]

## 2010-09-03 NOTE — H&P (Signed)
NAME:  Kathryn [name], Kathryn [name]                             ACCOUNT NO.:  [id number]   MEDICAL RECORD NO.:  [id number]                   PATIENT TYPE:  INP   LOCATION:  NA                                   FACILITY:  [facility]   PHYSICIAN:  Dyke Brackett, M.D.                 DATE OF BIRTH:  27-Jan-1951   DATE OF ADMISSION:  08/21/2002  DATE OF DISCHARGE:                                HISTORY & PHYSICAL   CHIEF COMPLAINT:  Right hip pain.   HISTORY OF PRESENT ILLNESS:  The patient is a 60 year old white female born  with dysplastic right hip.  The patient did very well with right hip until  five years ago when she developed pain in the hips.  Is now with constant  pain that is pressure-like in nature.  The pain is getting worse.  She has  popping and clicking sensation in the hip.  Sometimes the hip catches and  causes her to fall because it gives way.  She is unable to walk long  distance or climb stairs due to her hip pain.  When she sits for a long  period of time she has a great deal of pain.  Presently she is taking  Vicodin and some ibuprofen which gives her moderate pain relief.  She has  used a heel lift on the right in the past that makes up for a 1-3/4 inch leg  length discrepancy.  Now she walks on her tiptoes to compensate for this  discrepancy.  X-rays show severe degenerative changes and a dysplastic hip.   ALLERGIES:  No known drug allergies.   CURRENT MEDICATIONS:  1. Ferrous sulfate, enteric coated, 325 mg two daily.  2. Hydrocodone/APAP 750/7.5 mg one to two tablets q.4-6h. p.r.n. pain.  3. __________  q.h.s.   PAST MEDICAL HISTORY:  Right hip dysplasia.  Third left toe amputation due  to osteomyelitis.   PAST SURGICAL HISTORY:  1. Right hip surgery to try to correct a dysplastic hip in 1955.  2. Left knee surgery to stop growth in left leg to help compensate for leg     length discrepancy in 1965.  3. Tubal ligation 1982.  4. Left middle toe amputation due to infection,  osteomyelitis in 1998.     Patient states she had no problems with anesthesia with any of the above     procedures and she required no blood transfusions.   SOCIAL HISTORY:  The patient denies any tobacco use.  She drinks socially.  She is married and has two grown children.  She lives in a one story home to  which there is a ramp.  She is currently disabled due to her right hip  degenerative changes and dysplasia.  She has no primary care physician  assigned, however, she is currently seen at the [facility] in  [place].   FAMILY  HISTORY:  Mother is alive at age 76 and the only known health problem  is thyroid disease.  Her father is deceased age 64 due to gastric cancer  with metastatic disease.  The patient has two living brothers ages 41 and 15  and her brothers are in good health.  Patient has one brother who died at  the age 6 due to liver and kidney cancer.   REVIEW OF SYMPTOMS:  The patient has a partial of the right medial upper  incisor.  She wears glasses.  She denies any shortness of breath, paroxysmal  nocturnal dyspnea, orthopnea or angina.  Her review of systems is otherwise  negative or noncontributory except for right hip pain as indicated in the  history of present illness.   PHYSICAL EXAMINATION:  GENERAL:  Well-developed, well-nourished female who  walks with a limp on the right and antalgic gait.  She actually walks on her  toes to make up for her leg length discrepancy.  The patient's mood and  affect are appropriate.  She talks easily with the examiner.  VITAL SIGNS: Height 5', 1.5.  Weight 108 pounds.  Temperature 98.2, pulse  64, respiratory rate 16, blood pressure 120/80.  CARDIAC:  Regular rate and rhythm, no murmurs, rubs or gallops noted.  RESPIRATORY:  Lungs clear to auscultation bilaterally . No wheezing and no  rhonchi noted.  ABDOMEN:  Positive bowel sounds in all four quadrants.  The abdomen is soft  and nontender.  No hepatomegaly,  splenomegaly noted.  HEENT:  Normocephalic, atraumatic without frontal or maxillary tenderness to  palpation.  Conjunctiva are pink bilaterally.  PERRLA. Extraocular muscles  intact bilaterally.  There are no physical external ear deformities.  Tympanic membranes are pearly and gray bilaterally.  Nose and nasal septum  midline.  Nasal mucosa moist and pink with no polyps.  Buccal mucosa is pink  and moist.  Good dentition.  Pharynx without erythema or exudate.  Tongue  and uvula are midline.  NECK:  Trachea midline.  No lymphadenopathy noted.  Carotids are 2+  bilaterally without bruits.  There is no cervical spine tenderness.  She has  full range of motion of the cervical spine.  BACK:  Thoracic and lumbar spine are nontender to palpation.  The patient  has full range of motion of the lower back.  BREASTS/GENITOURINARY/RECTAL:  Examinations are deferred at this time.  NEUROLOGICAL:  Alert and oriented X3. Cranial nerves II-XII grossly intact.  Upper and lower extremity strength 5/5 bilaterally.  Deep tendon reflexes 2+  bilaterally upper and lower extremities except for on the left lower  extremity, reflex is not elicited.  This dates back to patient having  decreased sensation in the left lower extremity from her left knee surgery  at 60 years old.  MUSCULOSKELETAL:  Upper extremities have full range of motion, 2+ radial  pulses bilaterally.  Lower extremities- right hip internal and external  rotation very limited due to pain.  Left hip internal and external rotation,  full range of motion.  Straight leg raise on the left is negative.  Straight  leg raise on the right is not done due to the patient's pain.  Left and  right knee have full range of motion, no crepitus appreciated.  McMurray's  is negative on the left and on the right is deferred due to pain.  Pedal  pulses are bilaterally 1+, equal and symmetric.  There is no lower extremity edema noted.  EHL and FHL intact bilaterally.  Hammer toe of the left second  toe.  The patient has bilateral bunions.  The left middle toe is amputated  due to osteomyelitis, well healed.   PLAN:  Patient will be admitted to [facility] on Aug 21, 2002.  At that time she will undergo a total hip arthroplasty and possible  leg lengthening on the right.  The patient has given autologous blood if any  blood transfusion is required.     Richardean Canal, Arnetha Courser, M.D.    GC/MEDQ  D:  08/13/2002  T:  08/13/2002  Job:  [id number]

## 2010-09-03 NOTE — Consult Note (Signed)
[place]. [facility]  Patient:    ROSALIND, GUIDO Visit Number: [id number] MRN: [id number]          Service Type: EMS Location: [facility] Attending Physician:  Cathren Laine Dictated by:   Georgia Lopes, D.M.D. Proc. Date: 11/06/01 Admit Date:  11/06/2001 Discharge Date: 11/07/2001                            Consultation Report  DATE OF PROCEDURE:  November 06, 2001.  HISTORY OF PRESENT ILLNESS:  The patient is a 60 year old white female who slipped and fell earlier this evening and injured her mouth and avulsed one of her front teeth.  She had no loss of consciousness and [facility] and was then transferred to [facility] Emergency Room for evaluation and treatment.  DENTAL EXAMINATION:  Minor laceration of the upper gingiva at tooth #9.  The tooth was avulsed.  There was moderate periodontitis throughout the mouth but tooth #7, #8, #10, and #11, the surrounding teeth, were not level.  IMPRESSION:  Avulsed tooth #9.  PLAN:  Reimplant the tooth and stabilize.  PROCEDURE:  Lidocaine 2% 1:100,000 epinephrine was administrated with infiltration technique buccally and palatal a total of 3.6 cc.  The tooth was then replaced in the socket however, it was not stable and continued to pop out.  There was by palpation lack of buccal bone.  Attempt was made with a curette to explore the socket and verify the integrity of the socket but it felt as though some fragments of bone were displaced onto the palatal wall of the socket.  These fragments were repositioned buccally and the tooth was again replaced in the socket and acid etch composite splint was then placed from teeth #7-10 and a wire was luted into place with the composite material. The tooth was then stable and in satisfactory position.  There was a normal diastema which had been present preoperatively between her anterior teeth and adjacent to the other front teeth.  DISCHARGE DISPOSITION AND  INSTRUCTIONS:  The patient was discharged to home, given a prescription for Pen-Vee K 500 mg one q.6h. for 7 days and Vicodin 5/500 a total of 20 tabs one to two every 4-6 hours p.r.n. pain.  She was told to come to my office in 1 week for followup. Dictated by:   Georgia Lopes, D.M.D. Attending Physician:  Cathren Laine DD:  11/06/01 TD:  11/10/01 Job: [id number]

## 2010-09-23 ENCOUNTER — Other Ambulatory Visit: Payer: Self-pay | Admitting: Internal Medicine

## 2010-12-12 ENCOUNTER — Other Ambulatory Visit: Payer: Self-pay | Admitting: Internal Medicine

## 2011-01-17 LAB — ETHANOL: Alcohol, Ethyl (B): <5

## 2011-01-17 LAB — COMPREHENSIVE METABOLIC PANEL
ALT: 13
BUN: 6
CO2: 21
CO2: 26
Calcium: 7.4 — ABNORMAL LOW
Calcium: 8.8
Chloride: 106
Creatinine, Ser: 0.71
Creatinine, Ser: 0.75
GFR calc non Af Amer: >60
GFR calc non Af Amer: >60
Glucose, Bld: 91
Sodium: 137
Total Bilirubin: 0.9
Total Protein: 6.3

## 2011-01-17 LAB — COMPREHENSIVE METABOLIC PANEL WITH GFR
ALT: 20
AST: 55 — ABNORMAL HIGH
Albumin: 3 — ABNORMAL LOW
Alkaline Phosphatase: 86
GFR calc Af Amer: >60
Glucose, Bld: 116 — ABNORMAL HIGH
Potassium: 3.1 — ABNORMAL LOW
Sodium: 141
Total Protein: 6.4

## 2011-01-17 LAB — GLUCOSE, CAPILLARY
Glucose-Capillary: 157 — ABNORMAL HIGH
Glucose-Capillary: 93

## 2011-01-17 LAB — BASIC METABOLIC PANEL
CO2: 23
Calcium: 7.1 — ABNORMAL LOW
Calcium: 7.4 — ABNORMAL LOW
Calcium: 8.1 — ABNORMAL LOW
Chloride: 102
Chloride: 111
Creatinine, Ser: 0.48
Creatinine, Ser: 0.53
GFR calc Af Amer: >60
GFR calc Af Amer: >60
GFR calc Af Amer: >60
GFR calc Af Amer: >60
GFR calc non Af Amer: >60
GFR calc non Af Amer: >60
Glucose, Bld: 112 — ABNORMAL HIGH
Glucose, Bld: 92
Potassium: 3 — ABNORMAL LOW
Potassium: 3.1 — ABNORMAL LOW
Potassium: 3.9
Sodium: 135
Sodium: 136
Sodium: 141
Sodium: 141

## 2011-01-17 LAB — CULTURE, RESPIRATORY W GRAM STAIN
Culture: NORMAL
Gram Stain: NONE SEEN

## 2011-01-17 LAB — URINALYSIS, ROUTINE W REFLEX MICROSCOPIC
Bilirubin Urine: NEGATIVE
Glucose, UA: NEGATIVE
Hgb urine dipstick: NEGATIVE
Ketones, ur: NEGATIVE
Nitrite: NEGATIVE
Protein, ur: NEGATIVE
Specific Gravity, Urine: 1.016
Urobilinogen, UA: 0.2
pH: 6.5

## 2011-01-17 LAB — CULTURE, BLOOD (ROUTINE X 2)

## 2011-01-17 LAB — DIFFERENTIAL
Basophils Absolute: 0
Basophils Relative: 0
Eosinophils Absolute: 0
Eosinophils Absolute: 0
Eosinophils Relative: 0
Lymphocytes Relative: 5 — ABNORMAL LOW
Lymphocytes Relative: 9 — ABNORMAL LOW
Lymphs Abs: 0.3 — ABNORMAL LOW
Lymphs Abs: 0.8
Monocytes Absolute: 0.3
Monocytes Relative: 4
Monocytes Relative: 5
Neutro Abs: 5.8
Neutro Abs: 7.8 — ABNORMAL HIGH
Neutrophils Relative %: 87 — ABNORMAL HIGH
Neutrophils Relative %: 90 — ABNORMAL HIGH

## 2011-01-17 LAB — BASIC METABOLIC PANEL WITH GFR
BUN: 7
Calcium: 9.3
GFR calc non Af Amer: >60
Glucose, Bld: 150 — ABNORMAL HIGH

## 2011-01-17 LAB — FOLATE: Folate: 4.8

## 2011-01-17 LAB — MAGNESIUM
Magnesium: 0.6 — CL
Magnesium: 1.7
Magnesium: 2.1
Magnesium: 3.2 — ABNORMAL HIGH

## 2011-01-17 LAB — CBC
HCT: 36
Hemoglobin: 11.6 — ABNORMAL LOW
Hemoglobin: 12.3
MCHC: 33.8
MCHC: 34.1
MCV: 107.3 — ABNORMAL HIGH
MCV: 108.8 — ABNORMAL HIGH
Platelets: 109 — ABNORMAL LOW
RBC: 3.36 — ABNORMAL LOW
RDW: 14.6
RDW: 14.8
WBC: 6.4

## 2011-01-17 LAB — VITAMIN B12: Vitamin B-12: 304 (ref 211–911)

## 2011-01-17 LAB — RAPID STREP SCREEN (MED CTR MEBANE ONLY): Streptococcus, Group A Screen (Direct): NEGATIVE

## 2011-01-17 LAB — PHOSPHORUS
Phosphorus: 2 — ABNORMAL LOW
Phosphorus: 2.5

## 2011-01-17 LAB — H1N1 SCREEN (PCR): H1N1 Virus Scrn: NOT DETECTED

## 2011-01-17 LAB — CULTURE, BLOOD (SINGLE)

## 2011-02-04 ENCOUNTER — Encounter: Payer: Self-pay | Admitting: Internal Medicine

## 2011-03-11 ENCOUNTER — Other Ambulatory Visit: Payer: Self-pay | Admitting: Internal Medicine

## 2011-03-13 NOTE — Telephone Encounter (Signed)
No further RF w/o OV

## 2011-04-19 HISTORY — PX: FRACTURE SURGERY: SHX138

## 2011-06-11 ENCOUNTER — Other Ambulatory Visit: Payer: Self-pay | Admitting: Internal Medicine

## 2011-06-13 NOTE — Telephone Encounter (Signed)
Refill request for Toprol-XL 25mg . Pt has not been seen recently. OK to refill?

## 2011-06-29 NOTE — Telephone Encounter (Signed)
Refill: Metoprolol succ er 25mg  tab. #60. Take 1 tablet by mouth daily. *Need office visit for more*

## 2011-06-29 NOTE — Telephone Encounter (Signed)
Phoned pt to inform her that she needs an OV before she can receive more refills but did not get an answer.

## 2011-07-18 DIAGNOSIS — Z9289 Personal history of other medical treatment: Secondary | ICD-10-CM

## 2011-07-18 DIAGNOSIS — S42309A Unspecified fracture of shaft of humerus, unspecified arm, initial encounter for closed fracture: Secondary | ICD-10-CM

## 2011-07-18 HISTORY — DX: Unspecified fracture of shaft of humerus, unspecified arm, initial encounter for closed fracture: S42.309A

## 2011-07-18 HISTORY — DX: Personal history of other medical treatment: Z92.89

## 2011-07-23 ENCOUNTER — Emergency Department (HOSPITAL_COMMUNITY): Payer: Self-pay

## 2011-07-23 ENCOUNTER — Encounter (HOSPITAL_COMMUNITY): Payer: Self-pay | Admitting: *Deleted

## 2011-07-23 ENCOUNTER — Emergency Department (HOSPITAL_COMMUNITY)
Admission: EM | Admit: 2011-07-23 | Discharge: 2011-07-23 | Disposition: A | Discharge status: Home / Self Care | Payer: Self-pay | Attending: Emergency Medicine | Admitting: Emergency Medicine

## 2011-07-23 DIAGNOSIS — M79609 Pain in unspecified limb: Secondary | ICD-10-CM | POA: Insufficient documentation

## 2011-07-23 DIAGNOSIS — I10 Essential (primary) hypertension: Secondary | ICD-10-CM | POA: Insufficient documentation

## 2011-07-23 DIAGNOSIS — Y92009 Unspecified place in unspecified non-institutional (private) residence as the place of occurrence of the external cause: Secondary | ICD-10-CM | POA: Insufficient documentation

## 2011-07-23 DIAGNOSIS — S42309A Unspecified fracture of shaft of humerus, unspecified arm, initial encounter for closed fracture: Secondary | ICD-10-CM

## 2011-07-23 DIAGNOSIS — S42413A Displaced simple supracondylar fracture without intercondylar fracture of unspecified humerus, initial encounter for closed fracture: Secondary | ICD-10-CM | POA: Insufficient documentation

## 2011-07-23 DIAGNOSIS — Z79899 Other long term (current) drug therapy: Secondary | ICD-10-CM | POA: Insufficient documentation

## 2011-07-23 DIAGNOSIS — M25529 Pain in unspecified elbow: Secondary | ICD-10-CM | POA: Insufficient documentation

## 2011-07-23 DIAGNOSIS — M25429 Effusion, unspecified elbow: Secondary | ICD-10-CM | POA: Insufficient documentation

## 2011-07-23 DIAGNOSIS — W010XXA Fall on same level from slipping, tripping and stumbling without subsequent striking against object, initial encounter: Secondary | ICD-10-CM | POA: Insufficient documentation

## 2011-07-23 HISTORY — DX: Essential (primary) hypertension: I10

## 2011-07-23 LAB — COMPREHENSIVE METABOLIC PANEL
ALT: 31 U/L (ref 0–35)
AST: 176 U/L — ABNORMAL HIGH (ref 0–37)
CO2: 21 mEq/L (ref 19–32)
Chloride: 98 mEq/L (ref 96–112)
Creatinine, Ser: 0.36 mg/dL — ABNORMAL LOW (ref 0.50–1.10)
GFR calc non Af Amer: >90 mL/min (ref >90)
Glucose, Bld: 162 mg/dL — ABNORMAL HIGH (ref 70–99)
Sodium: 138 mEq/L (ref 135–145)
Total Bilirubin: 2.6 mg/dL — ABNORMAL HIGH (ref 0.3–1.2)

## 2011-07-23 LAB — DIFFERENTIAL
Basophils Absolute: 0 10*3/uL (ref 0.0–0.1)
Lymphocytes Relative: 7 % — ABNORMAL LOW (ref 12–46)
Lymphs Abs: 0.7 10*3/uL (ref 0.7–4.0)
Monocytes Absolute: 0.9 10*3/uL (ref 0.1–1.0)
Neutro Abs: 8.7 10*3/uL — ABNORMAL HIGH (ref 1.7–7.7)

## 2011-07-23 LAB — CBC
HCT: 30 % — ABNORMAL LOW (ref 36.0–46.0)
RBC: 2.7 MIL/uL — ABNORMAL LOW (ref 3.87–5.11)
RDW: 13.7 % (ref 11.5–15.5)
WBC: 10.4 10*3/uL (ref 4.0–10.5)

## 2011-07-23 MED ORDER — ONDANSETRON HCL 4 MG/2ML IJ SOLN
4.0000 mg | Freq: Once | INTRAMUSCULAR | Status: AC
  Administered 2011-07-23: 4 mg via INTRAVENOUS
  Filled 2011-07-23: qty 2

## 2011-07-23 MED ORDER — HYDROMORPHONE HCL PF 1 MG/ML IJ SOLN
1.0000 mg | Freq: Once | INTRAMUSCULAR | Status: AC
  Administered 2011-07-23: 1 mg via INTRAVENOUS
  Filled 2011-07-23: qty 1

## 2011-07-23 MED ORDER — ONDANSETRON 4 MG PO TBDP: ORAL_TABLET | ORAL | Status: DC

## 2011-07-23 MED ORDER — OXYCODONE-ACETAMINOPHEN 5-325 MG PO TABS: 2.0000 | ORAL_TABLET | ORAL | Status: DC | PRN

## 2011-07-23 NOTE — ED Notes (Signed)
EDP reporting pt can drink, pt provided ginger ale

## 2011-07-23 NOTE — ED Notes (Signed)
Patient waiting on ortho tech to apply splint.

## 2011-07-23 NOTE — ED Notes (Signed)
To ed for eval after falling last night. Right arm/elbow with pain and swelling. Right radial pulse strong and regular and cap refill wnl.

## 2011-07-23 NOTE — ED Notes (Signed)
Lab tubes were handed to Irma, phlebotomist.

## 2011-07-23 NOTE — Discharge Instructions (Signed)
Follow up with dr. Christian Mate Monday.  Return if problems

## 2011-07-23 NOTE — ED Provider Notes (Signed)
History     CSN: [id number]  Arrival date & time 07/23/11  1008   First MD Initiated Contact with Patient 07/23/11 1042      Chief Complaint  Patient presents with  . Arm Pain  . Fall    (Consider location/radiation/quality/duration/timing/severity/associated sxs/prior treatment) Patient is a 61 y.o. female presenting with arm pain and fall. The history is provided by the patient (The patient states that she fell last night on the right arm. Complains of pain in the elbow.). No language interpreter was used.  Arm Pain This is a new problem. The current episode started 6 to 12 hours ago. The problem occurs constantly. The problem has not changed since onset.Pertinent negatives include no chest pain, no abdominal pain and no headaches. The symptoms are aggravated by twisting. The symptoms are relieved by nothing. She has tried nothing for the symptoms. The treatment provided no relief.  Fall Pertinent negatives include no abdominal pain, no hematuria and no headaches.    Past Medical History  Diagnosis Date  . Hypertension     Past Surgical History  Procedure Date  . Joint replacement   . Fracture surgery     History reviewed. No pertinent family history.  History  Substance Use Topics  . Smoking status: Never Smoker   . Smokeless tobacco: Not on file  . Alcohol Use: Yes    OB History    Grav Para Term Preterm Abortions TAB SAB Ect Mult Living                  Review of Systems  Constitutional: Negative for fatigue.  HENT: Negative for congestion, sinus pressure and ear discharge.   Eyes: Negative for discharge.  Respiratory: Negative for cough.   Cardiovascular: Negative for chest pain.  Gastrointestinal: Negative for abdominal pain and diarrhea.  Genitourinary: Negative for frequency and hematuria.  Musculoskeletal: Negative for back pain.       Right arm pain  Skin: Negative for rash.  Neurological: Negative for seizures and headaches.  Hematological:  Negative.   Psychiatric/Behavioral: Negative for hallucinations.    Allergies  Review of patient's allergies indicates no known allergies.  Home Medications   Current Outpatient Rx  Name Route Sig Dispense Refill  . CALCIUM PO Oral Take 1,600 mg by mouth 2 (two) times daily.    Marland Kitchen METOPROLOL SUCCINATE ER 25 MG PO TB24 Oral Take 25 mg by mouth every morning.    . OXYCODONE-ACETAMINOPHEN 5-325 MG PO TABS Oral Take 2 tablets by mouth every 4 (four) hours as needed for pain. 20 tablet 0    BP 119/85  Pulse 94  Temp(Src) 98.8 F (37.1 C) (Oral)  Resp 20  SpO2 98%  Physical Exam  Constitutional: She is oriented to person, place, and time. She appears well-developed.  HENT:  Head: Normocephalic.  Eyes: Conjunctivae are normal.  Neck: No tracheal deviation present.  Cardiovascular:  No murmur heard. Musculoskeletal: She exhibits tenderness.       Tender swollen right elbow. Patient has normal radial pulses and neuro vascular exam normal  Neurological: She is oriented to person, place, and time.  Skin: Skin is warm.  Psychiatric: She has a normal mood and affect.    ED Course  Procedures (including critical care time)  Labs Reviewed  CBC - Abnormal; Notable for the following:    RBC 2.70 (*)    Hemoglobin 10.5 (*)    HCT 30.0 (*)    MCV 111.1 (*)    [facility]  38.9 (*)    All other components within normal limits  DIFFERENTIAL - Abnormal; Notable for the following:    Neutrophils Relative 84 (*)    Neutro Abs 8.7 (*)    Lymphocytes Relative 7 (*)    All other components within normal limits  COMPREHENSIVE METABOLIC PANEL - Abnormal; Notable for the following:    Glucose, Bld 162 (*)    Creatinine, Ser 0.36 (*)    Albumin 3.2 (*)    AST 176 (*)    Alkaline Phosphatase 236 (*)    Total Bilirubin 2.6 (*)    All other components within normal limits   Dg Elbow 2 Views Right  07/23/2011  *RADIOLOGY REPORT*  Clinical Data: Pain post fall  RIGHT ELBOW - 2 VIEW  Comparison:  None.  Findings: Two views of the right elbow submitted.  There is oblique displaced supracondylar fracture in distal right humerus. Posterior fat pad sign is noted probable due to joint effusion.  IMPRESSION: Oblique displaced fracture in distal right humerus.  Original Report Authenticated By: Natasha Mead, M.D.     1. Humerus fracture      I spoke with Dr. Madelon Lips. He stated to be arm and a long arm splint. He will see the patient Monday. Exam of the hand and arm after the splint was applied. Patient was neuro vascularly intact. I talked to the patient about her liver enzymes being elevated and she stated that is not new and she will followup about that MDM  Humerus fx        Benny Lennert, MD 07/23/11 1310

## 2011-07-23 NOTE — ED Notes (Signed)
Patient reports she fell last night in her bedroom after accidentally tripping on the bed sheets.  Patient fell on right arm which is currently swollen and possibly deformed.  Radial pulses strong, right arm is not cyanotic or cool to touch; patient unable to move arm without severe pain.

## 2011-07-25 ENCOUNTER — Other Ambulatory Visit: Payer: Self-pay | Admitting: Orthopedic Surgery

## 2011-07-25 ENCOUNTER — Encounter (HOSPITAL_COMMUNITY): Payer: Self-pay

## 2011-07-25 ENCOUNTER — Ambulatory Visit
Admission: RE | Admit: 2011-07-25 | Discharge: 2011-07-25 | Disposition: A | Discharge status: Home / Self Care | Payer: Medicare Other | Source: Ambulatory Visit | Attending: Orthopedic Surgery | Admitting: Orthopedic Surgery

## 2011-07-25 DIAGNOSIS — T148XXA Other injury of unspecified body region, initial encounter: Secondary | ICD-10-CM

## 2011-07-25 DIAGNOSIS — R52 Pain, unspecified: Secondary | ICD-10-CM

## 2011-07-25 NOTE — Progress Notes (Signed)
Left voice message with Dr. Magdalene Patricia office, requesting preop orders for surgery 07/26/11

## 2011-07-25 NOTE — H&P (Signed)
[facility] Chief Complaint: Fall with R distal humerus fracture HPI: 61 y/o RHD  Female sustained fall on 07/22/2011, presented to ED on 07/23/2011 and was found to have comminuted R distal humerus fracture. Pt initially f/u with Dr. Madelon Lips today on 07/25/2011 but due to the complexity of the case she was referred to the OTS for definitive treatment. Dr. Madelon Lips felt that this particular injury would be best managed by a fellowship trained orthopaedic traumatologist.  As such we were able to see the pt in the office today, 07/25/2011, for initial evaluation.  After review of the plain films and extensive discussion with the pt, all parties involved agreed that surgery was the best option.    Past Medical History  Diagnosis Date  . Hypertension   . Dysrhythmia   . Arthritis   . Osteoporosis     Past Surgical History  Procedure Date  . Fracture surgery   . Amputation     two toes on left foot  . Bunionectomy     both feet  . Arthroscopic repair acl     right knee  . Joint replacement     right hip   . Tubal ligation     History reviewed. No pertinent family history. Social History:  reports that she has never smoked. She does not have any smokeless tobacco history on file. She reports that she drinks alcohol. She reports that she does not use illicit drugs. Pt states that she drinks one drink a day  Allergies: No Known Allergies  No current facility-administered medications on file as of .   Medications Prior to Admission  Medication Sig Dispense Refill  . CALCIUM PO Take 1,600 mg by mouth 2 (two) times daily.      . metoprolol succinate (TOPROL-XL) 25 MG 24 hr tablet Take 25 mg by mouth every morning.      . ondansetron (ZOFRAN ODT) 4 MG disintegrating tablet One every 6 hours for nauseau  20 tablet  0  . oxyCODONE-acetaminophen (PERCOCET) 5-325 MG per tablet Take 2 tablets by mouth every 4 (four) hours as needed for pain.  20 tablet  0  pt has also been on forteo  for 18 months for osteoporosis but has been off for several weeks now  BMET    Component Value Date/Time   NA 138 07/23/2011 1040   K 3.9 07/23/2011 1040   CL 98 07/23/2011 1040   CO2 21 07/23/2011 1040   GLUCOSE 162* 07/23/2011 1040   GLUCOSE 97 11/30/2009   BUN 9 07/23/2011 1040   CREATININE 0.36* 07/23/2011 1040   CALCIUM 8.6 07/23/2011 1040   CALCIUM 9.8 02/21/2007 2239   GFRNONAA >90 07/23/2011 1040   GFRAA >90 07/23/2011 1040   CBC    Component Value Date/Time   WBC 10.4 07/23/2011 1040   RBC 2.70* 07/23/2011 1040   HGB 10.5* 07/23/2011 1040   HCT 30.0* 07/23/2011 1040   PLT 185 07/23/2011 1040   MCV 111.1* 07/23/2011 1040   MCH 38.9* 07/23/2011 1040   MCHC 35.0 07/23/2011 1040   RDW 13.7 07/23/2011 1040   LYMPHSABS 0.7 07/23/2011 1040   MONOABS 0.9 07/23/2011 1040   EOSABS 0.0 07/23/2011 1040   BASOSABS 0.0 07/23/2011 1040      Review of Systems  Constitutional: Negative for fever and chills.  HENT: Negative for hearing loss and sore throat.   Eyes: Negative for blurred vision and pain.  Respiratory: Negative for cough and shortness of breath.  Cardiovascular: Negative for chest pain, palpitations and PND.  Gastrointestinal: Negative for nausea, vomiting and abdominal pain.  Genitourinary: Negative for dysuria.  Musculoskeletal:       R elbow pain  Neurological: Negative for dizziness, tingling and headaches.    Height 5\' 1"  (1.549 m), weight 48.988 kg (108 lb). Physical Exam  Constitutional: She is oriented to person, place, and time. She is cooperative. No distress.       Older appearing female, NAD, pleasant, carrying personal belongings in [place] bag  HENT:  Head: Normocephalic and atraumatic.  Eyes: Pupils are equal, round, and reactive to light.  Neck: Normal range of motion.  Cardiovascular: Regular rhythm, S1 normal and S2 normal.  Tachycardia present.   No murmur heard. Respiratory: Effort normal. She has decreased breath sounds in the right lower field and the left lower field.  She has no wheezes. She has no rhonchi.  GI: Soft. Bowel sounds are normal. She exhibits no distension. There is no tenderness.  Musculoskeletal:       Right Upper Extremity  Arm in splint + swelling distally Splint not taken down R/U/M/Ax sensation intact AIN, PIN motor intact Proximal R/U/M motor intact Extremity is warm, brisk cap refill    Neurological: She is alert and oriented to person, place, and time.  Skin: Skin is warm.  Psychiatric: She has a normal mood and affect.     Assessment/Plan 61 y/o female s/p fall with R distal humerus fracture  1. R distal humerus fracture  Plan for OR for ORIF   Splint immobilization post op for 2-3 days then remove splint to begin gentle passive motion  Plan for 2-3 day hospital stay for pain control and therapies  2. Elevated AST, alk phos and total bilirubin  Will recheck LFT's  Check GGT  Check bone specific fraction of alk phos. Possible that forteo is causing this elevation but does would not explain elevated bili  Will check amylase and lipase as well, eval for evidence of pancreatitis  Suspect pt not completely forthcoming with degree of etoh intake  Check hepatitis panel as well 3. dispo  OR for R distal humerus  F/u labs  Will also check bone profile labs as well  Will try to avoid APAP if possible    Mearl Latin, PA-C [facility] [phone] (P) 07/25/2011, 3:31 PM

## 2011-07-26 ENCOUNTER — Encounter (HOSPITAL_COMMUNITY): Payer: Self-pay | Admitting: Pharmacy Technician

## 2011-07-26 ENCOUNTER — Ambulatory Visit (HOSPITAL_COMMUNITY): Payer: Medicare Other | Admitting: Certified Registered Nurse Anesthetist

## 2011-07-26 ENCOUNTER — Inpatient Hospital Stay (HOSPITAL_COMMUNITY)
Admission: RE | Admit: 2011-07-26 | Discharge: 2011-07-29 | DRG: 493 | Disposition: A | Discharge status: Home Health | Payer: Medicare Other | Source: Ambulatory Visit | Attending: Orthopedic Surgery | Admitting: Orthopedic Surgery

## 2011-07-26 ENCOUNTER — Encounter (HOSPITAL_COMMUNITY): Payer: Self-pay | Admitting: *Deleted

## 2011-07-26 ENCOUNTER — Encounter (HOSPITAL_COMMUNITY): Payer: Self-pay | Admitting: Certified Registered Nurse Anesthetist

## 2011-07-26 ENCOUNTER — Encounter (HOSPITAL_COMMUNITY)
Admission: RE | Disposition: A | Discharge status: Home Health | Payer: Self-pay | Source: Ambulatory Visit | Attending: Orthopedic Surgery

## 2011-07-26 ENCOUNTER — Ambulatory Visit (HOSPITAL_COMMUNITY): Payer: Medicare Other

## 2011-07-26 ENCOUNTER — Encounter (HOSPITAL_COMMUNITY): Payer: Medicare Other

## 2011-07-26 DIAGNOSIS — K219 Gastro-esophageal reflux disease without esophagitis: Secondary | ICD-10-CM | POA: Insufficient documentation

## 2011-07-26 DIAGNOSIS — S42413A Displaced simple supracondylar fracture without intercondylar fracture of unspecified humerus, initial encounter for closed fracture: Principal | ICD-10-CM | POA: Diagnosis present

## 2011-07-26 DIAGNOSIS — M1990 Unspecified osteoarthritis, unspecified site: Secondary | ICD-10-CM

## 2011-07-26 DIAGNOSIS — G5690 Unspecified mononeuropathy of unspecified upper limb: Secondary | ICD-10-CM

## 2011-07-26 DIAGNOSIS — S42401A Unspecified fracture of lower end of right humerus, initial encounter for closed fracture: Secondary | ICD-10-CM

## 2011-07-26 DIAGNOSIS — M4120 Other idiopathic scoliosis, site unspecified: Secondary | ICD-10-CM

## 2011-07-26 DIAGNOSIS — K700 Alcoholic fatty liver: Secondary | ICD-10-CM | POA: Diagnosis present

## 2011-07-26 DIAGNOSIS — Y998 Other external cause status: Secondary | ICD-10-CM

## 2011-07-26 DIAGNOSIS — I10 Essential (primary) hypertension: Secondary | ICD-10-CM | POA: Insufficient documentation

## 2011-07-26 DIAGNOSIS — S98139A Complete traumatic amputation of one unspecified lesser toe, initial encounter: Secondary | ICD-10-CM

## 2011-07-26 DIAGNOSIS — K760 Fatty (change of) liver, not elsewhere classified: Secondary | ICD-10-CM

## 2011-07-26 DIAGNOSIS — M810 Age-related osteoporosis without current pathological fracture: Secondary | ICD-10-CM | POA: Insufficient documentation

## 2011-07-26 DIAGNOSIS — Z96649 Presence of unspecified artificial hip joint: Secondary | ICD-10-CM

## 2011-07-26 DIAGNOSIS — R296 Repeated falls: Secondary | ICD-10-CM | POA: Diagnosis present

## 2011-07-26 DIAGNOSIS — E871 Hypo-osmolality and hyponatremia: Secondary | ICD-10-CM

## 2011-07-26 DIAGNOSIS — F1020 Alcohol dependence, uncomplicated: Secondary | ICD-10-CM

## 2011-07-26 DIAGNOSIS — K859 Acute pancreatitis without necrosis or infection, unspecified: Secondary | ICD-10-CM

## 2011-07-26 DIAGNOSIS — D62 Acute posthemorrhagic anemia: Secondary | ICD-10-CM

## 2011-07-26 DIAGNOSIS — D696 Thrombocytopenia, unspecified: Secondary | ICD-10-CM | POA: Diagnosis present

## 2011-07-26 DIAGNOSIS — R945 Abnormal results of liver function studies: Secondary | ICD-10-CM | POA: Insufficient documentation

## 2011-07-26 DIAGNOSIS — E785 Hyperlipidemia, unspecified: Secondary | ICD-10-CM

## 2011-07-26 HISTORY — DX: Cardiac arrhythmia, unspecified: I49.9

## 2011-07-26 HISTORY — DX: Unspecified osteoarthritis, unspecified site: M19.90

## 2011-07-26 HISTORY — PX: ORIF HUMERUS FRACTURE: SHX2126

## 2011-07-26 LAB — URINALYSIS, ROUTINE W REFLEX MICROSCOPIC
Glucose, UA: NEGATIVE mg/dL
Hgb urine dipstick: NEGATIVE
Ketones, ur: 15 mg/dL — AB
Protein, ur: 30 mg/dL — AB
pH: 7 (ref 5.0–8.0)

## 2011-07-26 LAB — CBC
HCT: 32 % — ABNORMAL LOW (ref 36.0–46.0)
Hemoglobin: 10.6 g/dL — ABNORMAL LOW (ref 12.0–15.0)
MCHC: 33.1 g/dL (ref 30.0–36.0)
RBC: 2.77 MIL/uL — ABNORMAL LOW (ref 3.87–5.11)

## 2011-07-26 LAB — SURGICAL PCR SCREEN: Staphylococcus aureus: NEGATIVE

## 2011-07-26 LAB — COMPREHENSIVE METABOLIC PANEL
ALT: 23 U/L (ref 0–35)
Alkaline Phosphatase: 219 U/L — ABNORMAL HIGH (ref 39–117)
BUN: 10 mg/dL (ref 6–23)
CO2: 28 mEq/L (ref 19–32)
Chloride: 96 mEq/L (ref 96–112)
GFR calc Af Amer: >90 mL/min (ref >90)
GFR calc non Af Amer: >90 mL/min (ref >90)
Glucose, Bld: 118 mg/dL — ABNORMAL HIGH (ref 70–99)
Potassium: 3 mEq/L — ABNORMAL LOW (ref 3.5–5.1)
Total Bilirubin: 3.6 mg/dL — ABNORMAL HIGH (ref 0.3–1.2)

## 2011-07-26 LAB — URINE MICROSCOPIC-ADD ON

## 2011-07-26 LAB — DIFFERENTIAL
Basophils Relative: 0 % (ref 0–1)
Eosinophils Absolute: 0 10*3/uL (ref 0.0–0.7)
Eosinophils Relative: 0 % (ref 0–5)
Lymphocytes Relative: 6 % — ABNORMAL LOW (ref 12–46)
Monocytes Relative: 8 % (ref 3–12)
Neutro Abs: 9.4 10*3/uL — ABNORMAL HIGH (ref 1.7–7.7)

## 2011-07-26 LAB — HEMOGLOBIN A1C: Hgb A1c MFr Bld: 4.9 % (ref <5.7)

## 2011-07-26 LAB — LIPASE, BLOOD: Lipase: 13 U/L (ref 11–59)

## 2011-07-26 LAB — CALCIUM, IONIZED: Calcium, Ion: 1.08 mmol/L — ABNORMAL LOW (ref 1.12–1.32)

## 2011-07-26 LAB — PROTIME-INR: Prothrombin Time: 14.2 seconds (ref 11.6–15.2)

## 2011-07-26 SURGERY — OPEN REDUCTION INTERNAL FIXATION (ORIF) DISTAL HUMERUS FRACTURE
Anesthesia: General | Laterality: Right | Wound class: Clean

## 2011-07-26 MED ORDER — PHENYLEPHRINE HCL 10 MG/ML IJ SOLN
INTRAMUSCULAR | Status: DC | PRN
  Administered 2011-07-26: 80 ug via INTRAVENOUS
  Administered 2011-07-26: 40 ug via INTRAVENOUS
  Administered 2011-07-26: 80 ug via INTRAVENOUS
  Administered 2011-07-26: 40 ug via INTRAVENOUS
  Administered 2011-07-26 (×2): 80 ug via INTRAVENOUS
  Administered 2011-07-26 (×2): 40 ug via INTRAVENOUS
  Administered 2011-07-26 (×2): 80 ug via INTRAVENOUS

## 2011-07-26 MED ORDER — CEFAZOLIN SODIUM 1-5 GM-% IV SOLN
INTRAVENOUS | Status: AC
  Filled 2011-07-26: qty 50

## 2011-07-26 MED ORDER — ALBUMIN HUMAN 5 % IV SOLN
INTRAVENOUS | Status: DC | PRN
  Administered 2011-07-26 (×2): via INTRAVENOUS

## 2011-07-26 MED ORDER — PROPOFOL 10 MG/ML IV EMUL
INTRAVENOUS | Status: DC | PRN
  Administered 2011-07-26: 200 mg via INTRAVENOUS

## 2011-07-26 MED ORDER — MUPIROCIN 2 % EX OINT
TOPICAL_OINTMENT | Freq: Two times a day (BID) | CUTANEOUS | Status: DC
  Administered 2011-07-26: 1 via NASAL
  Administered 2011-07-27 – 2011-07-29 (×4): via NASAL
  Filled 2011-07-26 (×2): qty 22

## 2011-07-26 MED ORDER — ONDANSETRON HCL 4 MG/2ML IJ SOLN
INTRAMUSCULAR | Status: DC | PRN
  Administered 2011-07-26: 4 mg via INTRAVENOUS

## 2011-07-26 MED ORDER — EPHEDRINE SULFATE 50 MG/ML IJ SOLN
INTRAMUSCULAR | Status: DC | PRN
  Administered 2011-07-26 (×4): 10 mg via INTRAVENOUS

## 2011-07-26 MED ORDER — CEFAZOLIN SODIUM 1-5 GM-% IV SOLN
1.0000 g | INTRAVENOUS | Status: AC
  Administered 2011-07-26: 1 g via INTRAVENOUS

## 2011-07-26 MED ORDER — LACTATED RINGERS IV SOLN
INTRAVENOUS | Status: DC | PRN
  Administered 2011-07-26 (×3): via INTRAVENOUS

## 2011-07-26 MED ORDER — BUPIVACAINE-EPINEPHRINE PF 0.5-1:200000 % IJ SOLN
INTRAMUSCULAR | Status: DC | PRN
  Administered 2011-07-26: 10 mL

## 2011-07-26 MED ORDER — LIDOCAINE HCL 4 % MT SOLN
OROMUCOSAL | Status: DC | PRN
  Administered 2011-07-26: 4 mL via TOPICAL

## 2011-07-26 MED ORDER — LIDOCAINE HCL (CARDIAC) 20 MG/ML IV SOLN
INTRAVENOUS | Status: DC | PRN
  Administered 2011-07-26: 60 mg via INTRAVENOUS

## 2011-07-26 MED ORDER — FENTANYL CITRATE 0.05 MG/ML IJ SOLN
INTRAMUSCULAR | Status: DC | PRN
  Administered 2011-07-26 (×2): 50 ug via INTRAVENOUS
  Administered 2011-07-26: 25 ug via INTRAVENOUS

## 2011-07-26 MED ORDER — MUPIROCIN 2 % EX OINT
TOPICAL_OINTMENT | CUTANEOUS | Status: AC
  Administered 2011-07-26: 1 via NASAL
  Filled 2011-07-26: qty 22

## 2011-07-26 MED ORDER — METOPROLOL SUCCINATE ER 25 MG PO TB24
25.0000 mg | ORAL_TABLET | ORAL | Status: AC
  Administered 2011-07-26: 25 mg via ORAL
  Filled 2011-07-26: qty 1

## 2011-07-26 MED ORDER — SODIUM CHLORIDE 0.9 % IV SOLN
INTRAVENOUS | Status: DC | PRN
  Administered 2011-07-26: 23:00:00 via INTRAVENOUS

## 2011-07-26 MED ORDER — LACTATED RINGERS IV SOLN: INTRAVENOUS | Status: DC

## 2011-07-26 MED ORDER — MIDAZOLAM HCL 5 MG/5ML IJ SOLN
INTRAMUSCULAR | Status: DC | PRN
  Administered 2011-07-26: 1 mg via INTRAVENOUS

## 2011-07-26 MED ORDER — ROCURONIUM BROMIDE 100 MG/10ML IV SOLN
INTRAVENOUS | Status: DC | PRN
  Administered 2011-07-26: 30 mg via INTRAVENOUS

## 2011-07-26 SURGICAL SUPPLY — 89 items
2.7 Metaphyseal Screw 20mm ×2 IMPLANT
2.7 VA locking screw 26mm ×2 IMPLANT
2.7 VA locking screw 28mm ×2 IMPLANT
2.7 VA locking screw 34mm ×2 IMPLANT
2.7 VA locking screw 54mm ×2 IMPLANT
BANDAGE ELASTIC 4 VELCRO ST LF (GAUZE/BANDAGES/DRESSINGS) ×4 IMPLANT
BANDAGE GAUZE ELAST BULKY 4 IN (GAUZE/BANDAGES/DRESSINGS) ×2 IMPLANT
BENZOIN TINCTURE PRP APPL 2/3 (GAUZE/BANDAGES/DRESSINGS) ×4 IMPLANT
BIT DRILL 2.5X110 QC LCP DISP (BIT) ×2 IMPLANT
BIT DRILL 2.8 (BIT) ×1
BIT DRILL CANN QC 2.8X165 (BIT) ×1 IMPLANT
BIT DRILL LCP QC 2X140 (BIT) ×2 IMPLANT
BIT DRILL QC 3.5X110 (BIT) ×2 IMPLANT
BLADE AVERAGE 25X9 (BLADE) ×2 IMPLANT
BNDG ESMARK 4X9 LF (GAUZE/BANDAGES/DRESSINGS) ×2 IMPLANT
BRUSH SCRUB DISP (MISCELLANEOUS) ×4 IMPLANT
CLOTH BEACON ORANGE TIMEOUT ST (SAFETY) ×2 IMPLANT
CORDS BIPOLAR (ELECTRODE) ×2 IMPLANT
COVER SURGICAL LIGHT HANDLE (MISCELLANEOUS) ×4 IMPLANT
DRAIN PENROSE 1/4X12 LTX STRL (WOUND CARE) ×2 IMPLANT
DRAPE C-ARM 42X72 X-RAY (DRAPES) ×2 IMPLANT
DRAPE C-ARMOR (DRAPES) IMPLANT
DRAPE EXTREMITY T 121X128X90 (DRAPE) ×2 IMPLANT
DRAPE INCISE IOBAN 66X45 STRL (DRAPES) IMPLANT
DRAPE SURG 17X11 SM STRL (DRAPES) ×4 IMPLANT
DRAPE U-SHAPE 47X51 STRL (DRAPES) ×4 IMPLANT
DRILL BIT 2.8MM (BIT) ×1
DRSG ADAPTIC 3X8 NADH LF (GAUZE/BANDAGES/DRESSINGS) ×4 IMPLANT
DRSG PAD ABDOMINAL 8X10 ST (GAUZE/BANDAGES/DRESSINGS) ×4 IMPLANT
ELECT CAUTERY BLADE 6.4 (BLADE) ×2 IMPLANT
ELECT REM PT RETURN 9FT ADLT (ELECTROSURGICAL) ×2
ELECTRODE REM PT RTRN 9FT ADLT (ELECTROSURGICAL) ×1 IMPLANT
EVACUATOR 1/8 PVC DRAIN (DRAIN) IMPLANT
GLOVE BIO SURGEON STRL SZ7.5 (GLOVE) ×2 IMPLANT
GLOVE BIO SURGEON STRL SZ8 (GLOVE) ×2 IMPLANT
GLOVE BIOGEL PI IND STRL 7.5 (GLOVE) ×1 IMPLANT
GLOVE BIOGEL PI IND STRL 8 (GLOVE) ×1 IMPLANT
GLOVE BIOGEL PI INDICATOR 7.5 (GLOVE) ×1
GLOVE BIOGEL PI INDICATOR 8 (GLOVE) ×1
GOWN PREVENTION PLUS XLARGE (GOWN DISPOSABLE) ×2 IMPLANT
GOWN STRL NON-REIN LRG LVL3 (GOWN DISPOSABLE) ×4 IMPLANT
K-WIRE 1.6X150 (WIRE) ×4
KIT BASIN OR (CUSTOM PROCEDURE TRAY) ×2 IMPLANT
KIT ROOM TURNOVER OR (KITS) ×2 IMPLANT
KWIRE 1.6X150 (WIRE) ×2 IMPLANT
MANIFOLD NEPTUNE II (INSTRUMENTS) ×2 IMPLANT
NEEDLE HYPO 25X1 1.5 SAFETY (NEEDLE) ×2 IMPLANT
NS IRRIG 1000ML POUR BTL (IV SOLUTION) ×2 IMPLANT
PACK TOTAL JOINT (CUSTOM PROCEDURE TRAY) ×2 IMPLANT
PAD ARMBOARD 7.5X6 YLW CONV (MISCELLANEOUS) ×4 IMPLANT
PAD CAST 4YDX4 CTTN HI CHSV (CAST SUPPLIES) ×2 IMPLANT
PADDING CAST COTTON 4X4 STRL (CAST SUPPLIES) ×2
PLATE HUMERUS MED DISTAL (Plate) ×2 IMPLANT
Postlateral Distal Humerus Plate ×2 IMPLANT
SCREW CORTEX 3.5 20MM (Screw) ×3 IMPLANT
SCREW CORTEX 3.5 22MM (Screw) ×1 IMPLANT
SCREW CORTEX 3.5 24MM (Screw) ×2 IMPLANT
SCREW CORTEX 3.5 55MM (Screw) ×2 IMPLANT
SCREW LOCK CORT ST 3.5X20 (Screw) ×3 IMPLANT
SCREW LOCK CORT ST 3.5X22 (Screw) ×1 IMPLANT
SCREW LOCK CORT ST 3.5X24 (Screw) ×2 IMPLANT
SCREW LOCK T15 FT 20X3.5XST (Screw) ×1 IMPLANT
SCREW LOCK VA ST 2.7X14 (Screw) ×4 IMPLANT
SCREW LOCKING 3.5X20 (Screw) ×1 IMPLANT
SCREW LOCKING VA 2.7X30MM (Screw) ×2 IMPLANT
SCREW METAPHYSCAL 34MM (Screw) ×2 IMPLANT
SCREW METAPHYSCAL 46MM (Screw) ×2 IMPLANT
SPONGE GAUZE 4X4 12PLY (GAUZE/BANDAGES/DRESSINGS) ×2 IMPLANT
SPONGE LAP 18X18 X RAY DECT (DISPOSABLE) IMPLANT
STAPLER VISISTAT 35W (STAPLE) ×4 IMPLANT
STOCKINETTE IMPERVIOUS 9X36 MD (GAUZE/BANDAGES/DRESSINGS) IMPLANT
STRYKER SAGITTAL NARROW THIN EXTRA SHORTBLADE ×2 IMPLANT
SUCTION FRAZIER TIP 10 FR DISP (SUCTIONS) ×2 IMPLANT
SUT ETHIBOND 5 LR DA (SUTURE) ×2 IMPLANT
SUT PROLENE 1 CT (SUTURE) ×2 IMPLANT
SUT PROLENE 2 0 CT2 30 (SUTURE) ×2 IMPLANT
SUT VIC AB 0 CT1 27 (SUTURE) ×2
SUT VIC AB 0 CT1 27XBRD ANBCTR (SUTURE) ×2 IMPLANT
SUT VIC AB 1 CT1 27 (SUTURE) ×1
SUT VIC AB 1 CT1 27XBRD ANBCTR (SUTURE) ×1 IMPLANT
SUT VIC AB 2-0 CT1 27 (SUTURE) ×3
SUT VIC AB 2-0 CT1 TAPERPNT 27 (SUTURE) ×3 IMPLANT
SYR 5ML LL (SYRINGE) IMPLANT
SYR CONTROL 10ML LL (SYRINGE) ×2 IMPLANT
TOWEL OR 17X24 6PK STRL BLUE (TOWEL DISPOSABLE) ×2 IMPLANT
TOWEL OR 17X26 10 PK STRL BLUE (TOWEL DISPOSABLE) ×6 IMPLANT
TRAY FOLEY CATH 14FR (SET/KITS/TRAYS/PACK) ×2 IMPLANT
WATER STERILE IRR 1000ML POUR (IV SOLUTION) ×2 IMPLANT
YANKAUER SUCT BULB TIP NO VENT (SUCTIONS) ×2 IMPLANT

## 2011-07-26 NOTE — Anesthesia Procedure Notes (Addendum)
Anesthesia Regional Block:  Axillary brachial plexus block  Pre-Anesthetic Checklist: ,, timeout performed, Correct Patient, Correct Site, Correct Laterality, Correct Procedure, Correct Position, site marked, Risks and benefits discussed,  Surgical consent,  Pre-op evaluation,  At surgeon's request and post-op pain management  Laterality: Right  Prep: Maximum Sterile Barrier Precautions used and chloraprep       Needles:  Injection technique: Single-shot  Needle Type: Stimulator Needle - 80        Needle insertion depth: 6 cm   Additional Needles:  Procedures: nerve stimulator Axillary brachial plexus block  Nerve Stimulator or Paresthesia:  Response: 0.5 mA, 0.1 ms, 6 cm  Additional Responses:   Narrative:  Start time: 07/26/2011 6:50 PM End time: 07/26/2011 6:57 PM Injection made incrementally with aspirations every 5 mL.  Performed by: Personally  Anesthesiologist: Maren Beach MD  Additional Notes: 10cc 0.5% Marcaine w/ epi w/o difficulty or discomfort  GES   Procedure Name: Intubation Date/Time: 07/26/2011 7:25 PM Performed by: Rogelia Boga Pre-anesthesia Checklist: Patient identified, Emergency Drugs available, Suction available, Patient being monitored and Timeout performed Patient Re-evaluated:Patient Re-evaluated prior to inductionOxygen Delivery Method: Circle system utilized Preoxygenation: Pre-oxygenation with 100% oxygen Intubation Type: IV induction Ventilation: Mask ventilation without difficulty Laryngoscope Size: Mac and 4 Grade View: Grade I Tube type: Oral Tube size: 7.0 mm Number of attempts: 1 Airway Equipment and Method: Stylet and LTA kit utilized Placement Confirmation: ETT inserted through vocal cords under direct vision,  positive ETCO2 and breath sounds checked- equal and bilateral Secured at: 21 cm Tube secured with: Tape Dental Injury: Teeth and Oropharynx as per pre-operative assessment        Narrative:    Anesthesia  Regional Block:   Narrative:

## 2011-07-26 NOTE — Preoperative (Signed)
Beta Blockers   Reason not to administer Beta Blockers:Not Applicable 

## 2011-07-26 NOTE — H&P (Signed)
I have seen and examined the patient. I agree with the findings above. I discussed with the patient the risks and benefits of surgery, including the possibility of infection, nerve injury, possibility of transposition, vessel injury, wound breakdown, arthritis, symptomatic hardware, DVT/ PE, loss of motion, and need for further surgery among others.  We also specifically discussed her elevated LFT's and their possible effects on surgery.  Dr. Ezequiel Essex felt that no additional benefit would be likely within a short time frame from futher testing or work up, but picture consistent with alcoholism and that should be targeted for long term rehabilitation. Patient understood these risks and wished to proceed.  Budd Palmer, MD 07/26/2011 6:24 PM

## 2011-07-26 NOTE — Anesthesia Preprocedure Evaluation (Addendum)
Anesthesia Evaluation  Patient identified by MRN, date of birth, ID band Patient awake    Reviewed: Allergy & Precautions, H&P , NPO status , Patient's Chart, lab work & pertinent test results  History of Anesthesia Complications Negative for: history of anesthetic complications  Airway Mallampati: I TM Distance: >3 FB Neck ROM: full    Dental  (+) Dental Advisory Given   Pulmonary          Cardiovascular hypertension, Pt. on medications and Pt. on home beta blockers - dysrhythmias Rhythm:regular Rate:Normal     Neuro/Psych  Neuromuscular disease    GI/Hepatic GERD-  Controlled,  Endo/Other    Renal/GU      Musculoskeletal   Abdominal   Peds  Hematology   Anesthesia Other Findings   Reproductive/Obstetrics                          Anesthesia Physical Anesthesia Plan  ASA: II  Anesthesia Plan:    Post-op Pain Management:    Induction: Intravenous  Airway Management Planned: Oral ETT  Additional Equipment:   Intra-op Plan:   Post-operative Plan: Extubation in OR  Informed Consent: I have reviewed the patients History and Physical, chart, labs and discussed the procedure including the risks, benefits and alternatives for the proposed anesthesia with the patient or authorized representative who has indicated his/her understanding and acceptance.   Dental advisory given  Plan Discussed with: CRNA, Surgeon and Anesthesiologist  Anesthesia Plan Comments:         Anesthesia Quick Evaluation

## 2011-07-26 NOTE — Brief Op Note (Signed)
07/26/2011  11:40 PM  PATIENT:  Kathryn [name]  61 y.o. female  PRE-OPERATIVE DIAGNOSIS:  RIGHT DISTAL HUMERUS T intercondylar FRACTURE  POST-OPERATIVE DIAGNOSIS:  RIGHT DISTAL T intercondylar HUMERUS FRACTURE  PROCEDURE:  Procedure(s) (LRB): OPEN REDUCTION INTERNAL FIXATION (ORIF) DISTAL T intercondylar HUMERUS FRACTURE (Right)  SURGEON:  Surgeon(s) and Role:    * Budd Palmer, MD - Primary  PHYSICIAN ASSISTANT: Montez Morita, [facility]  ASSISTANTS: PA student   ANESTHESIA:   general with regional block  EBL:  Total I/O In: 3500 [I.V.:3000; IV Piggyback:500] Out: 1050 [Urine:900; Blood:150]  BLOOD ADMINISTERED:none  DRAINS: none   LOCAL MEDICATIONS USED:  Regional block  SPECIMEN:  No Specimen  DISPOSITION OF SPECIMEN:  N/A  COUNTS:  YES  TOURNIQUET:  * Missing tourniquet times found for documented tourniquets in log:  [id number] *  DICTATION: .Other Dictation: Dictation Number [id number]  PLAN OF CARE: Admit to inpatient   PATIENT DISPOSITION:  PACU - hemodynamically stable.   Delay start of Pharmacological VTE agent (>24hrs) due to surgical blood loss or risk of bleeding: no

## 2011-07-26 NOTE — Progress Notes (Addendum)
Patient belongings sent with patient to PACU. She states there are no valuables other than a cell phone and glasses. Patient offered for them to be locked in security. She states they will be ok in her bag. She was informed hospital is not responsible for belongings.   Patient attempted to void at 1330. Unable to void.

## 2011-07-27 ENCOUNTER — Ambulatory Visit (HOSPITAL_COMMUNITY): Payer: Medicare Other

## 2011-07-27 ENCOUNTER — Encounter (HOSPITAL_COMMUNITY): Payer: Self-pay | Admitting: Orthopedic Surgery

## 2011-07-27 ENCOUNTER — Inpatient Hospital Stay (HOSPITAL_COMMUNITY): Payer: Medicare Other

## 2011-07-27 DIAGNOSIS — D62 Acute posthemorrhagic anemia: Secondary | ICD-10-CM

## 2011-07-27 DIAGNOSIS — S42401A Unspecified fracture of lower end of right humerus, initial encounter for closed fracture: Secondary | ICD-10-CM

## 2011-07-27 DIAGNOSIS — F1020 Alcohol dependence, uncomplicated: Secondary | ICD-10-CM

## 2011-07-27 LAB — ALKALINE PHOSPHATASE, ISOENZYMES
Alk Phos Bone Fract: 129 U/L
Alk Phos: 213 U/L — ABNORMAL HIGH (ref 39–117)

## 2011-07-27 LAB — VITAMIN D 25 HYDROXY (VIT D DEFICIENCY, FRACTURES): Vit D, 25-Hydroxy: 25 ng/mL — ABNORMAL LOW (ref 30–89)

## 2011-07-27 LAB — BILIRUBIN, FRACTIONATED(TOT/DIR/INDIR)
Bilirubin, Direct: 2.3 mg/dL — ABNORMAL HIGH (ref 0.0–0.3)
Indirect Bilirubin: 1.5 mg/dL — ABNORMAL HIGH (ref 0.3–0.9)
Total Bilirubin: 3.8 mg/dL — ABNORMAL HIGH (ref 0.3–1.2)

## 2011-07-27 LAB — RETICULOCYTES
RBC.: 1.83 MIL/uL — ABNORMAL LOW (ref 3.87–5.11)
Retic Count, Absolute: 98.8 10*3/uL (ref 19.0–186.0)
Retic Ct Pct: 5.4 % — ABNORMAL HIGH (ref 0.4–3.1)

## 2011-07-27 LAB — PREALBUMIN: Prealbumin: 16.1 mg/dL — ABNORMAL LOW (ref 17.0–34.0)

## 2011-07-27 LAB — BASIC METABOLIC PANEL
BUN: 6 mg/dL (ref 6–23)
CO2: 24 mEq/L (ref 19–32)
Chloride: 103 mEq/L (ref 96–112)
GFR calc Af Amer: >90 mL/min (ref >90)
Potassium: 3.2 mEq/L — ABNORMAL LOW (ref 3.5–5.1)

## 2011-07-27 LAB — FOLATE: Folate: 2.5 ng/mL — ABNORMAL LOW

## 2011-07-27 LAB — CBC
HCT: 22.2 % — ABNORMAL LOW (ref 36.0–46.0)
RBC: 1.92 MIL/uL — ABNORMAL LOW (ref 3.87–5.11)
RDW: 14.2 % (ref 11.5–15.5)
WBC: 7.4 10*3/uL (ref 4.0–10.5)

## 2011-07-27 LAB — VITAMIN B12: Vitamin B-12: 377 pg/mL (ref 211–911)

## 2011-07-27 LAB — IRON AND TIBC
Saturation Ratios: 11 % — ABNORMAL LOW (ref 20–55)
UIBC: 120 ug/dL — ABNORMAL LOW (ref 125–400)

## 2011-07-27 MED ORDER — HYDROMORPHONE HCL PF 1 MG/ML IJ SOLN
0.2500 mg | INTRAMUSCULAR | Status: DC | PRN
  Administered 2011-07-27 (×3): 0.5 mg via INTRAVENOUS

## 2011-07-27 MED ORDER — MAGNESIUM SULFATE 40 MG/ML IJ SOLN
4.0000 g | Freq: Once | INTRAMUSCULAR | Status: AC
  Administered 2011-07-27: 4 g via INTRAVENOUS
  Filled 2011-07-27: qty 100

## 2011-07-27 MED ORDER — DIPHENHYDRAMINE HCL 25 MG PO CAPS
25.0000 mg | ORAL_CAPSULE | Freq: Once | ORAL | Status: AC
  Administered 2011-07-27: 25 mg via ORAL
  Filled 2011-07-27 (×2): qty 1

## 2011-07-27 MED ORDER — METOCLOPRAMIDE HCL 10 MG PO TABS: 5.0000 mg | ORAL_TABLET | Freq: Three times a day (TID) | ORAL | Status: DC | PRN

## 2011-07-27 MED ORDER — CEFAZOLIN SODIUM 1-5 GM-% IV SOLN
1.0000 g | Freq: Four times a day (QID) | INTRAVENOUS | Status: AC
  Administered 2011-07-27 (×3): 1 g via INTRAVENOUS
  Filled 2011-07-27 (×4): qty 50

## 2011-07-27 MED ORDER — METOCLOPRAMIDE HCL 5 MG/ML IJ SOLN: 5.0000 mg | Freq: Three times a day (TID) | INTRAMUSCULAR | Status: DC | PRN

## 2011-07-27 MED ORDER — NALOXONE HCL 0.4 MG/ML IJ SOLN: 0.4000 mg | INTRAMUSCULAR | Status: DC | PRN

## 2011-07-27 MED ORDER — METHOCARBAMOL 100 MG/ML IJ SOLN: 500.0000 mg | Freq: Four times a day (QID) | INTRAVENOUS | Status: DC | PRN

## 2011-07-27 MED ORDER — DIPHENHYDRAMINE HCL 50 MG/ML IJ SOLN: 12.5000 mg | Freq: Four times a day (QID) | INTRAMUSCULAR | Status: DC | PRN

## 2011-07-27 MED ORDER — SODIUM CHLORIDE 0.9 % IJ SOLN: 9.0000 mL | INTRAMUSCULAR | Status: DC | PRN

## 2011-07-27 MED ORDER — MAGNESIUM CITRATE PO SOLN: 1.0000 | Freq: Once | ORAL | Status: AC | PRN

## 2011-07-27 MED ORDER — METHOCARBAMOL 500 MG PO TABS
500.0000 mg | ORAL_TABLET | Freq: Four times a day (QID) | ORAL | Status: DC | PRN
  Administered 2011-07-27 – 2011-07-29 (×7): 500 mg via ORAL
  Filled 2011-07-27 (×7): qty 1

## 2011-07-27 MED ORDER — MORPHINE SULFATE (PF) 1 MG/ML IV SOLN
INTRAVENOUS | Status: DC
  Administered 2011-07-27: 01:00:00 via INTRAVENOUS
  Administered 2011-07-27: 1.5 mg via INTRAVENOUS
  Administered 2011-07-27: 08:00:00 via INTRAVENOUS

## 2011-07-27 MED ORDER — MORPHINE SULFATE 2 MG/ML IJ SOLN: 2.0000 mg | INTRAMUSCULAR | Status: DC | PRN

## 2011-07-27 MED ORDER — ONDANSETRON HCL 4 MG/2ML IJ SOLN
4.0000 mg | Freq: Four times a day (QID) | INTRAMUSCULAR | Status: DC | PRN
  Administered 2011-07-27: 4 mg via INTRAVENOUS
  Filled 2011-07-27: qty 2

## 2011-07-27 MED ORDER — ENOXAPARIN SODIUM 40 MG/0.4ML ~~LOC~~ SOLN
40.0000 mg | Freq: Every day | SUBCUTANEOUS | Status: DC
  Administered 2011-07-27 – 2011-07-29 (×3): 40 mg via SUBCUTANEOUS
  Filled 2011-07-27 (×3): qty 0.4

## 2011-07-27 MED ORDER — MORPHINE SULFATE (PF) 1 MG/ML IV SOLN
INTRAVENOUS | Status: AC
  Filled 2011-07-27: qty 25

## 2011-07-27 MED ORDER — ACETAMINOPHEN 325 MG PO TABS
650.0000 mg | ORAL_TABLET | Freq: Once | ORAL | Status: AC
  Administered 2011-07-27: 650 mg via ORAL
  Filled 2011-07-27 (×2): qty 2

## 2011-07-27 MED ORDER — OXYCODONE HCL 5 MG PO TABS
5.0000 mg | ORAL_TABLET | ORAL | Status: DC | PRN
  Administered 2011-07-27: 10 mg via ORAL
  Administered 2011-07-28 (×2): 5 mg via ORAL
  Administered 2011-07-28 – 2011-07-29 (×2): 10 mg via ORAL
  Administered 2011-07-29: 5 mg via ORAL
  Administered 2011-07-29 (×3): 10 mg via ORAL
  Filled 2011-07-27: qty 2
  Filled 2011-07-27: qty 1
  Filled 2011-07-27 (×5): qty 2
  Filled 2011-07-27 (×2): qty 1

## 2011-07-27 MED ORDER — BISACODYL 10 MG RE SUPP: 10.0000 mg | Freq: Every day | RECTAL | Status: DC | PRN

## 2011-07-27 MED ORDER — POTASSIUM CHLORIDE IN NACL 20-0.9 MEQ/L-% IV SOLN
INTRAVENOUS | Status: DC
  Administered 2011-07-27 – 2011-07-28 (×2): via INTRAVENOUS
  Filled 2011-07-27 (×3): qty 1000

## 2011-07-27 MED ORDER — SPIRITUS FRUMENTI
1.0000 | ORAL | Status: DC | PRN
  Filled 2011-07-27: qty 1

## 2011-07-27 MED ORDER — DOCUSATE SODIUM 100 MG PO CAPS
100.0000 mg | ORAL_CAPSULE | Freq: Two times a day (BID) | ORAL | Status: DC
  Administered 2011-07-27 – 2011-07-29 (×3): 100 mg via ORAL
  Filled 2011-07-27 (×7): qty 1

## 2011-07-27 MED ORDER — POLYETHYLENE GLYCOL 3350 17 G PO PACK
17.0000 g | PACK | Freq: Every day | ORAL | Status: DC
  Administered 2011-07-27 – 2011-07-29 (×2): 17 g via ORAL
  Filled 2011-07-27 (×3): qty 1

## 2011-07-27 MED ORDER — ONDANSETRON HCL 4 MG/2ML IJ SOLN: 4.0000 mg | Freq: Once | INTRAMUSCULAR | Status: DC | PRN

## 2011-07-27 MED ORDER — METOPROLOL SUCCINATE ER 25 MG PO TB24
25.0000 mg | ORAL_TABLET | Freq: Every day | ORAL | Status: DC
  Administered 2011-07-28 – 2011-07-29 (×2): 25 mg via ORAL
  Filled 2011-07-27 (×3): qty 1

## 2011-07-27 MED ORDER — DIPHENHYDRAMINE HCL 12.5 MG/5ML PO ELIX: 12.5000 mg | ORAL_SOLUTION | ORAL | Status: DC | PRN

## 2011-07-27 MED ORDER — ONDANSETRON HCL 4 MG/2ML IJ SOLN: 4.0000 mg | Freq: Four times a day (QID) | INTRAMUSCULAR | Status: DC | PRN

## 2011-07-27 MED ORDER — FUROSEMIDE 10 MG/ML IJ SOLN
20.0000 mg | Freq: Once | INTRAMUSCULAR | Status: AC
  Administered 2011-07-27: 20 mg via INTRAVENOUS
  Filled 2011-07-27: qty 2

## 2011-07-27 MED ORDER — TRAMADOL HCL 50 MG PO TABS
50.0000 mg | ORAL_TABLET | Freq: Four times a day (QID) | ORAL | Status: DC | PRN
  Administered 2011-07-28: 50 mg via ORAL
  Filled 2011-07-27: qty 1

## 2011-07-27 MED ORDER — DIPHENHYDRAMINE HCL 12.5 MG/5ML PO ELIX: 12.5000 mg | ORAL_SOLUTION | Freq: Four times a day (QID) | ORAL | Status: DC | PRN

## 2011-07-27 MED ORDER — FUROSEMIDE 10 MG/ML IJ SOLN
20.0000 mg | Freq: Once | INTRAMUSCULAR | Status: DC
  Filled 2011-07-27: qty 2

## 2011-07-27 MED ORDER — ONDANSETRON HCL 4 MG PO TABS: 4.0000 mg | ORAL_TABLET | Freq: Four times a day (QID) | ORAL | Status: DC | PRN

## 2011-07-27 NOTE — Progress Notes (Signed)
Starting BP for patient receiving blood transfusion was 83/55. Subsequent BP 15 minutes later was 78/52. Dr. Carola Frost was notified and advised me to continue blood transfusion and hold all pain medications.

## 2011-07-27 NOTE — Progress Notes (Signed)
Orthopedic Tech Progress Note Patient Details:  Kathryn [name] 02/13/1951 [id number]  Patient ID: [name], female   DOB: 07-17-1950, 61 y.o.   MRN: [id number] Applied frame to patient bed.  [name] T 07/27/2011, 4:59 PM

## 2011-07-27 NOTE — Anesthesia Postprocedure Evaluation (Signed)
  Anesthesia Post-op Note  Patient: Kathryn [name]  Procedure(s) Performed: Procedure(s) (LRB): OPEN REDUCTION INTERNAL FIXATION (ORIF) DISTAL HUMERUS FRACTURE (Right)  Patient Location: PACU  Anesthesia Type: GA combined with regional for post-op pain  Level of Consciousness: awake, oriented and patient cooperative  Airway and Oxygen Therapy: Patient Spontanous Breathing and Patient connected to nasal cannula oxygen  Post-op Pain: mild  Post-op Assessment: Post-op Vital signs reviewed, Patient's Cardiovascular Status Stable, Respiratory Function Stable, Patent Airway, No signs of Nausea or vomiting and Pain level controlled  Post-op Vital Signs: stable  Complications: No apparent anesthesia complications

## 2011-07-27 NOTE — Consult Note (Signed)
Reason for Consult: Abnormal LFTs and alcohol abuse Referring Physician: [name] is an 61 y.o. female.   HPI: [name] with past history of alcohol abuse, hypertension, arthritis who is admitted to hospital for humerus fracture- and preop labs showed elevated bilirubin and GGT along with AST. Patient reports drinking 5 drinks of vodka every day for long time now. Has been evaluated and counseled multiple times by her PCP for alcohol cessation. She hasn't seen her regular doctor for last 18 months. Last MRI abdomen in June 2011 shows Heterogeneous fatty infiltration of the liver with marked enlargement left hepatic lobe-but no cirrhosis. Also spleen and pancreas were normal. Her total bilirubin yesterday is 3.6-and has been never this high before. Also her GGT was 1459. Alkaline phosphatase is 219 along with AST 102. PT/INR within normal limits and total protein 7.4.  Past Medical History  Diagnosis Date  . Hypertension   . Dysrhythmia   . Arthritis   . Osteoporosis     Past Surgical History  Procedure Date  . Fracture surgery   . Amputation     two toes on left foot  . Bunionectomy     both feet  . Arthroscopic repair acl     right knee  . Joint replacement     right hip   . Tubal ligation   . Orif humerus fracture 07/26/2011    Procedure: OPEN REDUCTION INTERNAL FIXATION (ORIF) DISTAL HUMERUS FRACTURE;  Surgeon: Budd Palmer, MD;  Location: [facility] OR;  Service: Orthopedics;  Laterality: Right;    History reviewed. No pertinent family history.  Social History:  reports that she has never smoked. She does not have any smokeless tobacco history on file. She reports that she drinks alcohol. She reports that she does not use illicit drugs.  Allergies: No Known Allergies  Medications: I have reviewed the patient's current medications.  Results for orders placed during the hospital encounter of 07/26/11 (from the past 48 hour(s))  URINALYSIS,  ROUTINE W REFLEX MICROSCOPIC     Status: Abnormal   Collection Time   07/26/11  1:20 PM      Component Value Range Comment   Color, Urine AMBER (*) YELLOW  BIOCHEMICALS MAY BE AFFECTED BY COLOR   APPearance HAZY (*) CLEAR     Specific Gravity, Urine 1.034 (*) 1.005 - 1.030     pH 7.0  5.0 - 8.0     Glucose, UA NEGATIVE  NEGATIVE (mg/dL)    Hgb urine dipstick NEGATIVE  NEGATIVE     Bilirubin Urine LARGE (*) NEGATIVE     Ketones, ur 15 (*) NEGATIVE (mg/dL)    Protein, ur 30 (*) NEGATIVE (mg/dL)    Urobilinogen, UA >9.1 (*) 0.0 - 1.0 (mg/dL)    Nitrite POSITIVE (*) NEGATIVE     Leukocytes, UA MODERATE (*) NEGATIVE    URINE MICROSCOPIC-ADD ON     Status: Abnormal   Collection Time   07/26/11  1:20 PM      Component Value Range Comment   Squamous Epithelial / LPF FEW (*) RARE     WBC, UA 7-10  <3 (WBC/hpf)    Bacteria, UA RARE  RARE     Urine-Other MUCOUS PRESENT     GAMMA GT     Status: Abnormal   Collection Time   07/26/11  1:29 PM      Component Value Range Comment   GGT 1459 (*) 7 - 51 (U/L)  HEMOGLOBIN A1C     Status: Normal   Collection Time   07/26/11  1:29 PM      Component Value Range Comment   Hemoglobin A1C 4.9  <5.7 (%)    Mean Plasma Glucose 94  <117 (mg/dL)   PREALBUMIN     Status: Abnormal   Collection Time   07/26/11  1:29 PM      Component Value Range Comment   Prealbumin 16.1 (*) 17.0 - 34.0 (mg/dL)   VITAMIN D 25 HYDROXY     Status: Abnormal   Collection Time   07/26/11  1:29 PM      Component Value Range Comment   Vit D, 25-Hydroxy 25 (*) 30 - 89 (ng/mL)   PTH, INTACT AND CALCIUM     Status: Normal   Collection Time   07/26/11  1:29 PM      Component Value Range Comment   PTH 60.2  14.0 - 72.0 (pg/mL)    Calcium, Total (PTH) 8.4  8.4 - 10.5 (mg/dL)   CALCIUM, IONIZED     Status: Abnormal   Collection Time   07/26/11  1:29 PM      Component Value Range Comment   Calcium, Ion 1.08 (*) 1.12 - 1.32 (mmol/L)   LIPASE, BLOOD     Status: Normal   Collection Time    07/26/11  1:29 PM      Component Value Range Comment   Lipase 13  11 - 59 (U/L)   AMYLASE     Status: Normal   Collection Time   07/26/11  1:29 PM      Component Value Range Comment   Amylase 43  0 - 105 (U/L)   ALKALINE PHOSPHATASE, ISOENZYMES     Status: Abnormal   Collection Time   07/26/11  1:29 PM      Component Value Range Comment   Alk Phos 213 (*) 39 - 117 (U/L)    Alk Phos Bone Fract 129      ALP, Heat Stable (Liver) 84      Alk Phos Liver Fract 39     CBC     Status: Abnormal   Collection Time   07/26/11  1:29 PM      Component Value Range Comment   WBC 11.0 (*) 4.0 - 10.5 (K/uL)    RBC 2.77 (*) 3.87 - 5.11 (MIL/uL)    Hemoglobin 10.6 (*) 12.0 - 15.0 (g/dL)    HCT 16.1 (*) 09.6 - 46.0 (%)    MCV 115.5 (*) 78.0 - 100.0 (fL)    MCH 38.3 (*) 26.0 - 34.0 (pg)    MCHC 33.1  30.0 - 36.0 (g/dL)    RDW 04.5  40.9 - 81.1 (%)    Platelets 181  150 - 400 (K/uL)   DIFFERENTIAL     Status: Abnormal   Collection Time   07/26/11  1:29 PM      Component Value Range Comment   Neutrophils Relative 86 (*) 43 - 77 (%)    Lymphocytes Relative 6 (*) 12 - 46 (%)    Monocytes Relative 8  3 - 12 (%)    Eosinophils Relative 0  0 - 5 (%)    Basophils Relative 0  0 - 1 (%)    Neutro Abs 9.4 (*) 1.7 - 7.7 (K/uL)    Lymphs Abs 0.7  0.7 - 4.0 (K/uL)    Monocytes Absolute 0.9  0.1 - 1.0 (K/uL)    Eosinophils Absolute 0.0  0.0 - 0.7 (K/uL)    Basophils Absolute 0.0  0.0 - 0.1 (K/uL)    RBC Morphology POLYCHROMASIA PRESENT     COMPREHENSIVE METABOLIC PANEL     Status: Abnormal   Collection Time   07/26/11  1:29 PM      Component Value Range Comment   Sodium 138  135 - 145 (mEq/L)    Potassium 3.0 (*) 3.5 - 5.1 (mEq/L)    Chloride 96  96 - 112 (mEq/L)    CO2 28  19 - 32 (mEq/L)    Glucose, Bld 118 (*) 70 - 99 (mg/dL)    BUN 10  6 - 23 (mg/dL)    Creatinine, Ser 1.61 (*) 0.50 - 1.10 (mg/dL)    Calcium 8.8  8.4 - 10.5 (mg/dL)    Total Protein 7.4  6.0 - 8.3 (g/dL)    Albumin 3.2 (*) 3.5 - 5.2  (g/dL)    AST 096 (*) 0 - 37 (U/L)    ALT 23  0 - 35 (U/L)    Alkaline Phosphatase 219 (*) 39 - 117 (U/L)    Total Bilirubin 3.6 (*) 0.3 - 1.2 (mg/dL)    GFR calc non Af Amer >90  >90 (mL/min)    GFR calc Af Amer >90  >90 (mL/min)   PROTIME-INR     Status: Normal   Collection Time   07/26/11  1:29 PM      Component Value Range Comment   Prothrombin Time 14.2  11.6 - 15.2 (seconds)    INR 1.08  0.00 - 1.49    APTT     Status: Normal   Collection Time   07/26/11  1:29 PM      Component Value Range Comment   aPTT 33  24 - 37 (seconds)   SURGICAL PCR SCREEN     Status: Normal   Collection Time   07/26/11  1:30 PM      Component Value Range Comment   MRSA, PCR NEGATIVE  NEGATIVE     Staphylococcus aureus NEGATIVE  NEGATIVE    CBC     Status: Abnormal   Collection Time   07/27/11  6:05 AM      Component Value Range Comment   WBC 7.4  4.0 - 10.5 (K/uL)    RBC 1.92 (*) 3.87 - 5.11 (MIL/uL)    Hemoglobin 7.5 (*) 12.0 - 15.0 (g/dL)    HCT 04.5 (*) 40.9 - 46.0 (%)    MCV 115.6 (*) 78.0 - 100.0 (fL)    MCH 39.1 (*) 26.0 - 34.0 (pg)    MCHC 33.8  30.0 - 36.0 (g/dL)    RDW 81.1  91.4 - 78.2 (%)    Platelets 115 (*) 150 - 400 (K/uL)   BASIC METABOLIC PANEL     Status: Abnormal   Collection Time   07/27/11  6:05 AM      Component Value Range Comment   Sodium 138  135 - 145 (mEq/L)    Potassium 3.2 (*) 3.5 - 5.1 (mEq/L)    Chloride 103  96 - 112 (mEq/L)    CO2 24  19 - 32 (mEq/L)    Glucose, Bld 109 (*) 70 - 99 (mg/dL)    BUN 6  6 - 23 (mg/dL)    Creatinine, Ser 9.56 (*) 0.50 - 1.10 (mg/dL)    Calcium 7.5 (*) 8.4 - 10.5 (mg/dL)    GFR calc non Af Amer >90  >90 (mL/min)    GFR calc Af  Amer >90  >90 (mL/min)   TYPE AND SCREEN     Status: Normal   Collection Time   07/27/11 12:28 PM      Component Value Range Comment   ABO/RH(D) A NEG      Antibody Screen POS      Sample Expiration 07/30/2011      DAT, IgG NEG      Antibody Identification ANTI-K      PT AG Type NEGATIVE FOR KELL  ANTIGEN     PREPARE RBC (CROSSMATCH)     Status: Normal   Collection Time   07/27/11 12:28 PM      Component Value Range Comment   Order Confirmation ORDER PROCESSED BY BLOOD BANK       Dg Chest 2 View  07/26/2011  *RADIOLOGY REPORT*  Clinical Data: Preoperative chest radiograph.  CHEST - 2 VIEW  Comparison: 04/26/2008.  Findings: Artifact from sling projects over the left chest. Cardiopericardial silhouette and mediastinal contours are within normal limits.  No displaced rib fractures are identified.  Distal right humerus fracture is again noted.  Old left-sided rib deformity is present.  IMPRESSION: No active cardiopulmonary disease.  Old left-sided rib fractures and recent distal humerus fracture.  Original Report Authenticated By: Andreas Newport, M.D.   Dg Elbow 2 Views Right  07/27/2011  *RADIOLOGY REPORT*  Clinical Data: Postop.  RIGHT ELBOW - 2 VIEW  Comparison: 1 day prior  Findings: Plate screw fixation of the radial and ulnar side of the distal humerus.  Improved alignment of the distal humerus fracture. Plate screw fixation of the the olecranon. No acute hardware complication.  IMPRESSION: Interval fixation of distal humerus and olecranon.  Improved alignment, without acute hardware complication.  Original Report Authenticated By: Consuello Bossier, M.D.   Dg Elbow Complete Right  07/27/2011  *RADIOLOGY REPORT*  Clinical Data: ORIF right elbow fracture.  RIGHT ELBOW - COMPLETE 3+ VIEW  Comparison: 07/25/2011  Findings: Multiple intraoperative spot images demonstrate plate and screw fixation of the distal humerus and proximal ulna.  Near anatomic alignment.  No complicating feature.  IMPRESSION: ORIF distal humeral and proximal ulnar fractures.  Original Report Authenticated By: Cyndie Chime, M.D.   Ct Elbow Right W/o Cm  07/25/2011  *RADIOLOGY REPORT*  Clinical Data: Fall 07/23/2011.  Comminuted fracture of the elbow. Operation 07/23/2011.  Three-dimensional reconstructions requested.  CT OF  THE RIGHT ELBOW WITHOUT CONTRAST,3-DIMENSIONAL CT IMAGE RENDERING ON INDEPENDENT WORKSTATION  Technique:  Multidetector CT imaging was performed according to the standard protocol. Multiplanar CT image reconstructions were also generated.  Comparison: 07/23/2011.  Findings: Comminuted fracture of the distal humerus is present with intra-articular extension.  The olecranon and radius appear intact. Diffuse swelling is present around the elbow.  The major fracture plane is transversely oriented in the distal radial metaphysis.  There is a T-shaped fracture of the distal humerus, extending into the ulnar margin of the capitellum. Capitellar fragment is minimally displaced from the trochlear fragment.  There is a transverse/axially oriented fracture through the capitellum and trochlea.  There is there is 1.5 shaft width posterior displacement of the trochlea relative to the distal humeral metaphysis.  Comminution fragments are present within the posterior joint. Radiocapitellar joint is located at the time of imaging.  Common extensor origin appears remain intact.  Common flexor origin is difficult to evaluate due to soft tissue swelling around the epicondyle.  The transversely oriented fracture plane through the ulnar aspect of the epicondyle extends through the expected location  of the common flexor origin.  Grossly, triceps tendon appears to remain intact.  Biceps tendon also grossly appears intact.  IMPRESSION: Complex intra-articular distal humerus fracture with multiple fracture planes.  Posterior displacement of trochlear and capitellar fragment.  3-DIMENSIONAL CT IMAGE RENDERING AT INDEPENDENT WORKSTATION:  Technique:  3-dimensional CT images were rendered by post- processing of the original CT data at independent workstation.  The 3-dimensional CT images were interpreted, and findings were reported in the accompanying complete CT report for this study.  Findings:  Three-dimensional reconstructions confirm above  findings.  IMPRESSION: As above.  Original Report Authenticated By: Andreas Newport, M.D.   Ct 3d Independent Wkst  07/25/2011  *RADIOLOGY REPORT*  Clinical Data: Fall 07/23/2011.  Comminuted fracture of the elbow. Operation 07/23/2011.  Three-dimensional reconstructions requested.  CT OF THE RIGHT ELBOW WITHOUT CONTRAST,3-DIMENSIONAL CT IMAGE RENDERING ON INDEPENDENT WORKSTATION  Technique:  Multidetector CT imaging was performed according to the standard protocol. Multiplanar CT image reconstructions were also generated.  Comparison: 07/23/2011.  Findings: Comminuted fracture of the distal humerus is present with intra-articular extension.  The olecranon and radius appear intact. Diffuse swelling is present around the elbow.  The major fracture plane is transversely oriented in the distal radial metaphysis.  There is a T-shaped fracture of the distal humerus, extending into the ulnar margin of the capitellum. Capitellar fragment is minimally displaced from the trochlear fragment.  There is a transverse/axially oriented fracture through the capitellum and trochlea.  There is there is 1.5 shaft width posterior displacement of the trochlea relative to the distal humeral metaphysis.  Comminution fragments are present within the posterior joint. Radiocapitellar joint is located at the time of imaging.  Common extensor origin appears remain intact.  Common flexor origin is difficult to evaluate due to soft tissue swelling around the epicondyle.  The transversely oriented fracture plane through the ulnar aspect of the epicondyle extends through the expected location of the common flexor origin.  Grossly, triceps tendon appears to remain intact.  Biceps tendon also grossly appears intact.  IMPRESSION: Complex intra-articular distal humerus fracture with multiple fracture planes.  Posterior displacement of trochlear and capitellar fragment.  3-DIMENSIONAL CT IMAGE RENDERING AT INDEPENDENT WORKSTATION:  Technique:   3-dimensional CT images were rendered by post- processing of the original CT data at independent workstation.  The 3-dimensional CT images were interpreted, and findings were reported in the accompanying complete CT report for this study.  Findings:  Three-dimensional reconstructions confirm above findings.  IMPRESSION: As above.  Original Report Authenticated By: Andreas Newport, M.D.   Abdominal ultrasound reveals fatty infiltration of the liver.   ROS:  As stated above in the HPI otherwise negative.  Blood pressure 97/64, pulse 104, temperature 100.7 F (38.2 C), temperature source Oral, resp. rate 16, height 5\' 1"  (1.549 m), weight 48.988 kg (108 lb), SpO2 93.00%.  PE: Gen: NAD, Alert and Oriented HEENT:  Kathryn [name] /AT, EOMI Neck: Supple, no LAD Lungs: CTA Bilaterally CV: RRR without M/G/R ABM: Soft, NTND, +BS Ext:  No edema   Assessment/Plan:  # Abnormal liver enzymes, Fatty liver and alcoholism: Patient did not have cirrhosis in 2011. Is a heavy drinker for many years. Also has thrombocytopenia, macrocytic anemia and hypokalemia.Although did not follow up with PCP for past 18 months or so. Did not have any abdominal ultrasound in past. - Followup hepatitis panel. - Followup abdominal ultrasound results to start evaluation of liver damage from alcohol. - Will check fractionated bilirubin and magnesium. Unlikely to be gallstone/CBD pathology. -  Patient says will quit alcohol from now- which will be the key in further management of her liver disease.  # Thrombocytopenia: Most likely alcohol related. Unclear if patient has cirrhosis.  # Macrocytic anemia: We'll check anemia panel to exclude underlying iron deficiency anemia. Hb is 7.5 gm/dl-receiving PRB'c today. Last vitamin B 12 level 212 in February 2010. - Most likely has vitamin B 12 deficiency- will need replacement after anemia panel results. Patient seen and discussed with Dr. Lyn Hollingshead.  PATEL,RAVI 07/27/2011, 3:43 PM

## 2011-07-27 NOTE — Progress Notes (Signed)
Critical Lab Value: Magnesium: 0.9  Dr. Loreta Ave on call for Dr. Allena Katz with GI residency was notifed. I was advised to call pharmacy and consult about possible meds and administration needed to replete magnesium level. Dr. Loreta Ave was called back and told about pharmacy suggestion of 4g IV magnesium. Verbal order for med was placed.  Mauro Kaufmann, RN

## 2011-07-27 NOTE — Op Note (Signed)
NAMEMertha, Clyatt [name]                   ACCOUNT NO.:  [id number]  MEDICAL RECORD NO.:  [id number]  LOCATION:  5034                         FACILITY:  [facility]  PHYSICIAN:  Doralee Albino. Carola Frost, M.D. DATE OF BIRTH:  Jun 15, 1950  DATE OF PROCEDURE:  07/26/2011 DATE OF DISCHARGE:                              OPERATIVE REPORT   PREOPERATIVE DIAGNOSIS:  Right supracondylar intercondylar distal humerus fracture.  POSTOPERATIVE DIAGNOSIS:  Right supracondylar intercondylar distal humerus fracture.  PROCEDURE:  Open reduction and internal fixation of T-type intercondylar distal humerus fracture.  SURGEON:  Doralee Albino. Carola Frost, MD  ASSISTANT:  Mearl Latin, PA  ANESTHESIA:  General.  COMPLICATIONS:  None.  TOTAL TOURNIQUET TIME:  2 hours and 2 minutes.  ESTIMATED BLOOD LOSS:  200.  DISPOSITION:  To PACU.  CONDITION:  Stable.  BRIEF SUMMARY AND INDICATION OF PROCEDURE:  Kathryn [name] is a 61 year old female, right-hand dominant, who fell at a ground level, sustained direct impact, and injury to her right elbow.  Plain film and CT scan demonstrated a T-type intercondylar supracondylar humerus fracture. LFTs were elevated preoperatively and we did discuss these with the anesthesiologist as well as the patient who felt that she was safe to proceed as there was no significant deviation from previous test, but it did show a trend toward worsening liver function and that her alcoholism should be addressed from a medical perspective.  I did discuss this very frankly with the patient and her husband, who at this time do not wish to undertake any sort of behavioral change, but are willing to discuss the matter further with others postoperatively.  She understood the risks of surgery to include nerve injury, infection, loss of motion, symptomatic hardware, DVT, PE, heart attack, stroke, nonunion, malunion, and multiple others and did wish to proceed.  BRIEF DESCRIPTION OF PROCEDURE:  The patient was  taken to the operating room, where general anesthesia was induced.  She was positioned with the right side up and a bump under her chest.  Her right arm was brought across a radiolucent foam, and a standard posterior approach made after exsanguinating the extremity and elevating the arm.  The tourniquet was inflated to 250 mmHg.  The ulnar nerve was identified and retracted safely out of the cubital fossa for reconstruction of the distal humerus.  The triceps was identified and released medially and laterally to find the bare spot of the olecranon articular surface.  This was cut with a microsagittal saw followed by completion of the osteotomy with a osteotome.  Triceps was reflected.  Radial nerve protected.  The fracture sites cleaned with curettage.  Lag screw placed from lateral to medial to secure the intercondylar split.  This would also be backed up later with medial-to-lateral fixation.  Because of coronal shear fractures of both the medial and lateral columns, I was unable to use mini frag fixation to secure the reduction initially despite my best efforts to do so.  This greatly increased the difficulty of the procedure.  This was followed by use of plates applied to either side and then teasing the fracture together sequentially with use of 2 dental picks and the Baker.  Ultimately, I was able to secure an excellent reduction with a posterolateral plate and a medial plate from [employer]. I did check all screws to make sure that they were of proper trajectory and length with multiple C-arm images.  The osteotomy was repaired with an Acumed Spike plate.  Montez Morita, PA-C, assisted me throughout and was absolutely necessary for the safe and effective completion of the case. Tourniquet was deflated after the fracture reduction was completed, 2 hours and 2 minutes.  I did add additional screws after that point, but again the reduction had been completed.  Triceps was reapproximated  on the lateral side with a running and interrupted figure-of-eight #1 Vicryl, then 2-0 Vicryl, and then staples for the skin.  Sterile gently compressive dressing and splint was applied.  The patient awakened from anesthesia and transported to the PACU in stable condition.  PROGNOSIS:  [name] will be in a splint for comfort for the next 24-48 hours,  at which time we will remove this and begin her with motion using occupational and physical therapy.  We will plan to see her back in the office for a range of motion check within a week or so of discharge, and again, we will consult substance abuse counselors if available to discuss with Ms. Kagel and to arrange followup if she is at all willing to do so.  She does not wish to take medications at this time and would prefer to stay on alcohol consequently contrary to our expressed recommendations, but within our allowances, she has opted for alcohol with lunch and dinner.  She will continue to be monitored, however, for withdrawal symptoms regardless.  Because of her substance abuse, she is certainly at increased risk for multiple complications including repeat injury.     Doralee Albino. Carola Frost, M.D.     [facility]  D:  07/26/2011  T:  07/27/2011  Job:  [id number]

## 2011-07-27 NOTE — Progress Notes (Signed)
Subjective: 1 Day Post-Op Procedure(s) (LRB): OPEN REDUCTION INTERNAL FIXATION (ORIF) DISTAL HUMERUS FRACTURE (Right)  Pt doing well this am Pain is 4/10 when arm is still, increases to 7 with movement Pain meds help Pt denies CP, SOB No N,V, D Denies numbness or tingling in R arm Pt willing to talk to SW re drinking problem but I question if pt ready to quit  Objective: Current Vitals Blood pressure 110/73, pulse 99, temperature 98.9 F (37.2 C), temperature source Oral, resp. rate 20, height 5\' 1"  (1.549 m), weight 48.988 kg (108 lb), SpO2 99.00%. Vital signs in last 24 hours: Temp:  [96.8 F (36 C)-99.2 F (37.3 C)] 98.9 F (37.2 C) (04/10 0523) Pulse Rate:  [96-110] 99  (04/10 0523) Resp:  [17-26] 20  (04/10 0523) BP: (106-124)/(71-82) 110/73 mmHg (04/10 0523) SpO2:  [93 %-100 %] 99 % (04/10 0523)  Intake/Output from previous day: 04/09 0701 - 04/10 0700 In: 4570 [P.O.:120; I.V.:3950; IV Piggyback:500] Out: 1100 [Urine:950; Blood:150] Intake/Output      04/09 0701 - 04/10 0700 04/10 0701 - 04/11 0700   P.O. 120    I.V. (mL/kg) 3950 (80.6)    IV Piggyback 500    Total Intake(mL/kg) 4570 (93.3)    Urine (mL/kg/hr) 950 (0.8)    Blood 150    Total Output 1100    Net +3470           LABS  Basename 07/27/11 0605 07/26/11 1329  HGB 7.5* 10.6*    Basename 07/27/11 0605 07/26/11 1329  WBC 7.4 11.0*  RBC 1.92* 2.77*  HCT 22.2* 32.0*  PLT 115* 181    Basename 07/27/11 0605 07/26/11 1329  NA 138 138  K 3.2* 3.0*  CL 103 96  CO2 24 28  BUN 6 10  CREATININE 0.38* 0.44*  GLUCOSE 109* 118*  CALCIUM 7.5* 8.8    Basename 07/26/11 1329  LABPT --  INR 1.08    Physical Exam  ZOX:WRUEA, alert, NAD Lungs:clear anterior fields, decreased at bases, occ scattered crackles Cardiac:s1 and s2 Abd:+ BS, NT Ext: Right Upper Extremity  Splint fitting well  Swelling stable  R/U/M/Ax sensation intact  Radial, ulnar, median, AIN, PIN motor intact  Brisk cap refill  noted  Good active motion of digits   Assessment/Plan: 1 Day Post-Op Procedure(s) (LRB): OPEN REDUCTION INTERNAL FIXATION (ORIF) DISTAL HUMERUS FRACTURE (Right)  61 y/o female s/p ground level fall with R distal humerus fracture and abnormal liver enzymes on admission  1. R distal humerus fracture s/p ORIF POD #1   Continue with splint for another day  Will d/c upon discharge  Will need to arrange for HHOT/PT  Digit and shoulder motion as tolerated  Will be allowed active flexion and passive flexion/extension  No active extension due to olecranon osteotomy and repair  NWB thru R elbow  Continue with ice and elevation  2. HTN  Stable  Continue to monitor 3. ABL anemia  Think pt is tachy as she is on beta-blocker and HR is 99  Will give 2 units of PRBC's today 4. FEN  Continue IVF  Suspect pt dry on admission and poor PO intake given social history  Diet as tolerated 5. Abnormal LFT's  Pt admitted to drinking nearly everyday when she was in OR holding area  After initial consult in offices we were concerned that the pt was not completely forthcoming with amount of etoh use.  Upon review of labs from ED stay, I was concerned for acute liver  process.  Further labs were obtained pre-op to eval.  Labs look to be chronic in nature and it was felt to be safe to proceed with the ortho procedure.    Will obtain GI consult so pt can have long term follow up and eval true etiology of liver enzyme elevation 6. EtOH dependence  Will get SW consult  Pt doesn't have the best support at home in terms of quitting as husband is an alcoholic and pt doesn't have a drivers license so getting to and from meetings may be difficult  I am not completely convinced that the pt is ready to quit  Pt does not want pharmacologic withdrawal protocol implemented, therefore pt allowed 1 drink prn with meals 7. Metabolic bone disease/osteoporosis  Related to number 6, which likely contributes to poor diet 8.  DVT/PE prophylaxis  Lovenox while inpt  No pharmacologics at discharge 9. Pain  Transition to PO meds   D/c pca 10. Dispo   Therapies today  GI consult and SW consult  Possible d/c home tomorrow   Mearl Latin, PA-C [facility] [phone] (P) 07/27/2011, 9:37 AM

## 2011-07-27 NOTE — Transfer of Care (Signed)
Immediate Anesthesia Transfer of Care Note  Patient: Kathryn [name]  Procedure(s) Performed: Procedure(s) (LRB): OPEN REDUCTION INTERNAL FIXATION (ORIF) DISTAL HUMERUS FRACTURE (Right)  Patient Location: PACU  Anesthesia Type: General  Level of Consciousness: awake, alert , oriented and patient cooperative  Airway & Oxygen Therapy: Patient Spontanous Breathing and Patient connected to nasal cannula oxygen  Post-op Assessment: Report given to PACU RN, Post -op Vital signs reviewed and stable and Patient moving all extremities  Post vital signs: Reviewed and stable  Complications: No apparent anesthesia complications
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
## 2011-07-27 NOTE — Progress Notes (Signed)
CARE MANAGEMENT NOTE 07/27/2011   Patient:  Kathryn [name], Kathryn [name]   Account Number:  [id number]  Date Initiated:  07/27/2011  Documentation initiated by:  Vance Peper  Subjective/Objective Assessment:   61 yr old female s/p ORIF right distal femur fracture     Action/Plan:   Spoke with patient regarding Home Health needs. [facility] offered.   Anticipated DC Date:  07/28/2011   Anticipated DC Plan:  HOME W HOME HEALTH SERVICES      DC Planning Services  CM consult  CM consult      PAC Choice  DURABLE MEDICAL EQUIPMENT  HOME HEALTH   Choice offered to / List presented to:  C-1 Patient   DME arranged  [facility]        HH arranged  HH-3 OT  HH-2 PT      HH agency  [facility]   Status of service:  Completed, signed off   Discharge Disposition:  HOME W HOME HEALTH SERVICES
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
## 2011-07-28 ENCOUNTER — Encounter (HOSPITAL_COMMUNITY): Payer: Self-pay

## 2011-07-28 DIAGNOSIS — K760 Fatty (change of) liver, not elsewhere classified: Secondary | ICD-10-CM

## 2011-07-28 DIAGNOSIS — E871 Hypo-osmolality and hyponatremia: Secondary | ICD-10-CM

## 2011-07-28 LAB — TYPE AND SCREEN
ABO/RH(D): A NEG
Antibody Screen: POSITIVE
DAT, IgG: NEGATIVE
Donor AG Type: NEGATIVE
Unit division: 0

## 2011-07-28 LAB — CBC
Hemoglobin: 10.4 g/dL — ABNORMAL LOW (ref 12.0–15.0)
Hemoglobin: 10.6 g/dL — ABNORMAL LOW (ref 12.0–15.0)
MCH: 32.7 pg (ref 26.0–34.0)
MCHC: 35.1 g/dL (ref 30.0–36.0)
Platelets: 123 10*3/uL — ABNORMAL LOW (ref 150–400)
Platelets: 137 10*3/uL — ABNORMAL LOW (ref 150–400)
RBC: 3.18 MIL/uL — ABNORMAL LOW (ref 3.87–5.11)
RBC: 3.26 MIL/uL — ABNORMAL LOW (ref 3.87–5.11)
WBC: 11 10*3/uL — ABNORMAL HIGH (ref 4.0–10.5)

## 2011-07-28 LAB — BASIC METABOLIC PANEL
Calcium: 7.5 mg/dL — ABNORMAL LOW (ref 8.4–10.5)
GFR calc non Af Amer: >90 mL/min (ref >90)
Glucose, Bld: 117 mg/dL — ABNORMAL HIGH (ref 70–99)
Potassium: 3.2 mEq/L — ABNORMAL LOW (ref 3.5–5.1)
Sodium: 132 mEq/L — ABNORMAL LOW (ref 135–145)

## 2011-07-28 LAB — HEPATITIS PANEL, ACUTE: Hep B C IgM: NEGATIVE

## 2011-07-28 MED ORDER — TRAMADOL HCL 50 MG PO TABS: 50.0000 mg | ORAL_TABLET | Freq: Four times a day (QID) | ORAL | Status: AC | PRN

## 2011-07-28 MED ORDER — OXYCODONE HCL 5 MG PO TABS: 5.0000 mg | ORAL_TABLET | ORAL | Status: AC | PRN

## 2011-07-28 MED ORDER — FOLIC ACID 1 MG PO TABS
1.0000 mg | ORAL_TABLET | Freq: Every day | ORAL | Status: DC
  Administered 2011-07-28 – 2011-07-29 (×2): 1 mg via ORAL
  Filled 2011-07-28 (×2): qty 1

## 2011-07-28 NOTE — Progress Notes (Signed)
Physical Therapy Treatment Patient Details Name: Kathryn [name] MRN: [id number] DOB: 1950/11/17 Today's Date: 12-20-2011  PT Assessment/Plan  PT - Assessment/Plan Comments on Treatment Session: Pt admitted s/p fall with right elbow fracture.  Pt able to increase ambulation distance and independence today with hemi-walker on left.  Motivated. PT Plan: Discharge plan remains appropriate;Frequency remains appropriate PT Frequency: Min 5X/week Follow Up Recommendations: Home health PT Equipment Recommended: Other (comment) (Hemi-walker) PT Goals  Acute Rehab PT Goals PT Goal Formulation: With patient Time For Goal Achievement: 7 days PT Goal: Supine/Side to Sit - Progress: Progressing toward goal PT Goal: Sit to Supine/Side - Progress: Progressing toward goal PT Goal: Sit to Stand - Progress: Progressing toward goal PT Goal: Stand to Sit - Progress: Progressing toward goal PT Goal: Ambulate - Progress: Progressing toward goal  PT Treatment Precautions/Restrictions  Precautions Precautions: Fall Required Braces or Orthoses DO NOT USE: Yes Required Braces or Orthoses: Other Brace/Splint Other Brace/Splint: Right UE sling. Restrictions Weight Bearing Restrictions: Yes RUE Weight Bearing: Non weight bearing Other Position/Activity Restrictions: No active right elbow extension.  Passive right elbow extension allowed.  Active/Passive elbow flexion allowed. Pain 8/10 with session in right elbow.  Pt premedicated and repositioned. Mobility (including Balance) Bed Mobility Bed Mobility: Yes Supine to Sit: 5: Supervision;HOB flat Supine to Sit Details (indicate cue type and reason): Verbal cues for sequence for pt to maintain NWBing right UE. Sit to Supine: 5: Supervision;HOB flat Sit to Supine - Details (indicate cue type and reason): Verbal cues for sequence to pt to maintain NWBing right UE. Transfers Transfers: Yes Sit to Stand: 5: Supervision;With upper extremity assist;From bed Sit  to Stand Details (indicate cue type and reason): Verbal cues for safest hand placement. Stand to Sit: 5: Supervision;With upper extremity assist;To bed Stand to Sit Details: Verbal cues for safest hand placement. Ambulation/Gait Ambulation/Gait: Yes Ambulation/Gait Assistance: 4: Min assist (Min (guard)) Ambulation/Gait Assistance Details (indicate cue type and reason): Guarding for balance with cues for safe sequence with hemi-walker. Ambulation Distance (Feet): 80 Feet Assistive device: Hemi-walker Gait Pattern: Step-to pattern;Decreased stride length;Trunk flexed Gait velocity: Quicker, yet still guarded cadence. Stairs: No Wheelchair Mobility Wheelchair Mobility: No  Posture/Postural Control Posture/Postural Control: No significant limitations Balance Balance Assessed: No End of Session PT - End of Session Equipment Utilized During Treatment: Gait belt Activity Tolerance: Patient tolerated treatment well Patient left: in bed;with call bell in reach;with bed alarm set Nurse Communication: Mobility status for transfers;Mobility status for ambulation;Weight bearing status General Behavior During Session: [facility] for tasks performed Cognition: [facility] for tasks performed  Cephus Shelling 12-20-2011, 12:57 PM  12-20-2011 Cephus Shelling, PT, DPT [phone]

## 2011-07-28 NOTE — Progress Notes (Signed)
The patient has alcoholic liver disease.  Her macrocytic anemia and thrmobocytopenia are consistent with ETOH abuse.  The liver is infiltrated with fat.  No overt signs of cirrhosis.  Her AP elevation with the elevated GGT is from her ETOH liver disease.  The best thing for her to do is to stop drinking ETOH.  No further GI recommendations at this time.  Signing off.

## 2011-07-28 NOTE — Progress Notes (Signed)
Subjective: Since we last evaluated the patient she is doing fine. Has no new complaints. Denies any CP, SOB. Hb is stable after transfusion. Mag, K, and folate replaced.   Objective: Vital signs in last 24 hours: Temp:  [98.7 F (37.1 C)-100.9 F (38.3 C)] 99.6 F (37.6 C) (04/11 1300) Pulse Rate:  [93-105] 93  (04/11 1300) Resp:  [14-18] 18  (04/11 1300) BP: (78-112)/(52-78) 107/73 mmHg (04/11 1300) SpO2:  [94 %-97 %] 97 % (04/11 1300) Last BM Date: 07/25/11  Intake/Output from previous day: 04/10 0701 - 04/11 0700 In: 778.3 [I.V.:250; Blood:528.3] Out: 550 [Urine:550] Intake/Output this shift:   PE: Gen: NAD, Alert and Oriented  HEENT: [place]/AT, EOMI  Neck: Supple, no LAD  Lungs: CTA Bilaterally  CV: RRR without M/G/R  ABM: Soft, NTND, +BS  Ext: No edema      Lab Results:  Basename 07/28/11 1016 07/28/11 0655 07/27/11 0605  WBC 11.0* 10.2 7.4  HGB 10.6* 10.4* 7.5*  HCT 30.5* 29.6* 22.2*  PLT 137* 123* 115*   BMET  Basename 07/28/11 0655 07/27/11 0605 07/26/11 1329  NA 132* 138 138  K 3.2* 3.2* 3.0*  CL 100 103 96  CO2 24 24 28   GLUCOSE 117* 109* 118*  BUN 6 6 10   CREATININE 0.38* 0.38* 0.44*  CALCIUM 7.5* 7.5* 8.48.8   LFT  Basename 07/27/11 1709 07/26/11 1329  PROT -- 7.4  ALBUMIN -- 3.2*  AST -- 102*  ALT -- 23  ALKPHOS -- 219*  BILITOT 3.8* --  BILIDIR 2.3* --  IBILI 1.5* --   PT/INR  Basename 07/26/11 1329  LABPROT 14.2  INR 1.08   Hepatitis Panel  Basename 07/26/11 1329  HEPBSAG NEGATIVE  HCVAB NEGATIVE  HEPAIGM NEGATIVE  HEPBIGM NEGATIVE    Studies/Results: Dg Elbow 2 Views Right  07/27/2011  *RADIOLOGY REPORT*  Clinical Data: Postop.  RIGHT ELBOW - 2 VIEW  Comparison: 1 day prior  Findings: Plate screw fixation of the radial and ulnar side of the distal humerus.  Improved alignment of the distal humerus fracture. Plate screw fixation of the the olecranon. No acute hardware complication.  IMPRESSION: Interval fixation of  distal humerus and olecranon.  Improved alignment, without acute hardware complication.  Original Report Authenticated By: Consuello Bossier, M.D.   Dg Elbow Complete Right  07/27/2011  *RADIOLOGY REPORT*  Clinical Data: ORIF right elbow fracture.  RIGHT ELBOW - COMPLETE 3+ VIEW  Comparison: 07/25/2011  Findings: Multiple intraoperative spot images demonstrate plate and screw fixation of the distal humerus and proximal ulna.  Near anatomic alignment.  No complicating feature.  IMPRESSION: ORIF distal humeral and proximal ulnar fractures.  Original Report Authenticated By: Cyndie Chime, M.D.   US Abdomen Complete  07/27/2011  *RADIOLOGY REPORT*  Clinical Data:  Alcoholic liver disease.  COMPLETE ABDOMINAL ULTRASOUND  Comparison:  MRI of the abdomen 10/02/2009.  Findings:  Gallbladder:  Minimal layering sludge is present within the gallbladder at the neck.  No stones are present.  There is no significant wall thickening.  There is no sonographic Murphy's sign.  Common bile duct:  Normal in caliber. No biliary ductal dilation. The maximal diameter is 3.4 mm.  Liver:  Diffuse increased echogenicity is present throughout the liver.  There is loss of normal internal echotexture.  No focal lesions are evident.  Minimal fluid is suggested around the liver.  IVC:  Not visualized due to overlying bowel gas.  Pancreas:  Not visualized due to overlying bowel gas.  Spleen:  Normal size and echotexture without focal parenchymal abnormality. The maximal length is 8.4 cm.  Right Kidney:  No hydronephrosis.  Well-preserved cortex.  Normal size and parenchymal echotexture without focal abnormalities.  The maximal length is 10.3 cm, within normal limits.  Left Kidney:  No hydronephrosis.  Well-preserved cortex.  Normal size and parenchymal echotexture without focal abnormalities.  The maximal length is 10.9 cm.  Abdominal aorta:  The distal aorta is not visualized.  The more proximal aorta is within normal limits.  IMPRESSION:   1.  Diffuse fatty infiltration of liver. 2.  Minimal fluid surrounds the liver.  This may represent hepatic congestion.  No periportal edema is evident.  Original Report Authenticated By: Jamesetta Orleans. MATTERN, M.D.    Medications: I have reviewed the patient's current medications.  Assessment/Plan:  # Abnormal liver enzymes, Fatty liver and alcoholism: Patient doesn't have cirrhosis per [place]. Also Hepatitis panel negative. Has increased direct bilirubin- likely from intrahepatic congestion 2/2 fatty infiltration. No Gallstones or CBD dilation. - Recommend aggressive Alcohol cessation counseling. - Patient says will quit alcohol from now- which will be the key in further management of her liver disease.   # Thrombocytopenia: Most likely alcohol related. Unclear if patient has cirrhosis.   # Macrocytic anemia: We'll check anemia panel to exclude underlying iron deficiency anemia. Hb 10.6< 10.4< 7.5 s/p PRBC. Vit B12 normal. Low Folate- will start replacement- atleast 4 months- 1mg /day    LOS: 2 days   Kathryn [name] 07/28/2011, 3:28 PM

## 2011-07-28 NOTE — Progress Notes (Signed)
Subjective: 2 Days Post-Op Procedure(s) (LRB): OPEN REDUCTION INTERNAL FIXATION (ORIF) DISTAL HUMERUS FRACTURE (Right)  Doing okay Pain right arm Denies any chest pain, shortness of breath No nausea, vomiting, diarrhea No numbness or tingling Voiding well Tolerating diet Patient was seen and evaluated by GI yesterday for elevated liver enzymes Abdominal ultrasound was performed this showed diffuse fatty infiltration of the liver The patient received 2 units of packed red cells yesterday for acute blood loss anemia tolerated this well. Had some hypotension yesterday as well pain medicine was held but her pressures have responded and are in good parameters today. Patient also received magnesium placement due to low magnesium levels this is presumably chronic due to her alcoholism  Objective: Current Vitals Blood pressure 111/78, pulse 95, temperature 100 F (37.8 C), temperature source Oral, resp. rate 16, height 5\' 1"  (1.549 m), weight 48.988 kg (108 lb), SpO2 97.00%. Vital signs in last 24 hours: Temp:  [98.7 F (37.1 C)-100.9 F (38.3 C)] 100 F (37.8 C) (04/11 0506) Pulse Rate:  [95-105] 95  (04/11 1013) Resp:  [14-18] 16  (04/11 0506) BP: (78-112)/(52-78) 111/78 mmHg (04/11 1013) SpO2:  [93 %-97 %] 97 % (04/11 0506)  Intake/Output from previous day: 04/10 0701 - 04/11 0700 In: 778.3 [I.V.:250; Blood:528.3] Out: 550 [Urine:550] Intake/Output      04/10 0701 - 04/11 0700 04/11 0701 - 04/12 0700   P.O.     I.V. (mL/kg) 250 (5.1)    Blood 528.3    IV Piggyback     Total Intake(mL/kg) 778.3 (15.9)    Urine (mL/kg/hr) 550 (0.5)    Blood     Total Output 550    Net +228.3         Urine Occurrence 5 x       LABS  Basename 07/28/11 0655 07/27/11 0605 07/26/11 1329  HGB 10.4* 7.5* 10.6*    Basename 07/28/11 0655 07/27/11 0605  WBC 10.2 7.4  RBC 3.18* 1.92*  HCT 29.6* 22.2*  PLT 123* 115*    Basename 07/28/11 0655 07/27/11 0605  NA 132* 138  K 3.2* 3.2*  CL  100 103  CO2 24 24  BUN 6 6  CREATININE 0.38* 0.38*  GLUCOSE 117* 109*  CALCIUM 7.5* 7.5*    Basename 07/26/11 1329  LABPT --  INR 1.08    Magnesium: 0.9  Physical Exam  Gen: Appears well resting comfortably in bed Lungs: Decreased at bases otherwise clear Cardiac: Regular, S1 and S2 Abd: Positive bowel sounds nontender Ext: Right upper extremity  Dressing and splint were removed  Incision looks fantastic it is clean dry and intact  Signs of infection  Swelling is controlled, swelling of the hand noted  Radial, ulnar, median sensory functions are intact  Radial, ulnar, median, AI and, PIN motor function is intact  Extremity is warm with a palpable radial pulse  Patient demonstrates good passive shoulder motion as well as good active hand and wrist motion.     Assessment/Plan: 2 Days Post-Op Procedure(s) (LRB): OPEN REDUCTION INTERNAL FIXATION (ORIF) DISTAL HUMERUS FRACTURE (Right)  61 y/o female s/p ground level fall with R distal humerus fracture and abnormal liver enzymes on admission   1. R distal humerus fracture s/p ORIF POD #2   Dressing change and splint discontinued   Will order home health OT and PT   Digit and shoulder motion as tolerated   Will be allowed active flexion and passive flexion/extension   No active extension due to olecranon osteotomy and  repair  Shoulder pendulums   NWB thru R elbow   Continue with ice and elevation  2. HTN   Patient actually with bouts of hypotension but currently stable, responded well to 2 units of packed red cells.   Continue to monitor  3. ABL anemia  Good response to packed red cells  Also looks as if patient has chronic anemia based on her anemia panel and does have some iron deficiency. Vitamin B12 levels are normal.   4. FEN   Normal saline lock  Diet as tolerated  Patient with some hypomagnesemia and hyponatremia, currently being corrected  5. Abnormal LFT's   Appreciate GI consult for management to  them 6. EtOH dependence  Will get SW consult  Pt doesn't have the best support at home in terms of quitting as husband is an alcoholic and pt doesn't have a drivers license so getting to and from meetings may be difficult  I am not completely convinced that the pt is ready to quit  Pt does not want pharmacologic withdrawal protocol implemented, therefore pt allowed 1 drink prn with meals   7. Metabolic bone disease/osteoporosis   Related to number 6, which likely contributes to poor diet  8. DVT/PE prophylaxis   Lovenox while inpt   No pharmacologics at discharge  9. Pain    PO meds   Continue to monitor blood pressures  10. Dispo   Therapies today   Plan for DC home tomorrow  Mearl Latin, PA-C [facility] [phone] (P) 2020-11-2111, 10:24 AM
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
## 2011-07-29 NOTE — Progress Notes (Signed)
Occupational Therapy Treatment\ Late Entry for 07/28/11 - note did not populate Patient Details Name: LETANYA FROH MRN: [id number] DOB: 1950/07/11 Today's Date: 07/29/2011  OT Assessment/Plan  Pt was instructed in Pendulum exercises, shoulder and hand AROM.  Shoulder flexion WFL actively, ER limited to ~35 degrees.  Gentle PROM elbow flex/ext. Performed with pt. Achieving ~35 degree arc of motion - flex ~20; ext ~15 (starting at 90).   Pt. Husband not present due to "having to move stuff into apartment".   Pt. States he will be available 07/29/11 for education.  Pt now planning to go to sister in laws at discharge.  Recommend HHOT.   OT Goals  See Doc flow sheets for goal status and goals addressed 07/28/11  OT Treatment  Pendulum exercises; AROM shoulder and hand, Gentle PROM Rt. Elbow performed.  Reinforced need for elevation and icing for edema management Precautions/Restrictions  Restrictions:  AROM Rt. Shoulder and hand:  Gentle ROM elbow - NO ACTIVE EXTENSION; Pendulums Weight Bearing Restrictions: Yes RUE Weight Bearing: Non weight bearing   ADL   Mobility   Min guard assist for ambulation   Exercises    End of Session  Pt. Left in bed with call bell in reach and bed alarm set.  Jeani Hawking M  07/29/2011, 10:53 AM

## 2011-07-29 NOTE — Discharge Instructions (Signed)
Home Health to be provided by [facility] [phone] 

## 2011-07-29 NOTE — Progress Notes (Signed)
Physical Therapy Treatment Patient Details Name: Kathryn [name] MRN: [id number] DOB: 1950/07/29 Today's Date: 07/29/2011  PT Assessment/Plan  PT - Assessment/Plan Comments on Treatment Session: Pt admitted s/p fall with right elbow fracture.  S/p ORIF to distal humerus.  She was better able to mobilize with less safety cues and less assist needed overall.  She did well on the stairs with a rail.   PT Plan: Discharge plan remains appropriate;Frequency remains appropriate PT Frequency: Min 5X/week Follow Up Recommendations: Home health PT Equipment Recommended:  (patient has already had hemi walker delivered to room) PT Goals  Acute Rehab PT Goals Pt will go Supine/Side to Sit: with modified independence;with HOB 0 degrees PT Goal: Supine/Side to Sit - Progress: Updated due to goal met Pt will go Sit to Supine/Side: with modified independence;with HOB 0 degrees PT Goal: Sit to Supine/Side - Progress: Updated due to goal met PT Goal: Sit to Stand - Progress: Progressing toward goal PT Goal: Stand to Sit - Progress: Progressing toward goal PT Goal: Ambulate - Progress: Progressing toward goal  PT Treatment Precautions/Restrictions  Precautions Precautions: Fall Required Braces or Orthoses DO NOT USE: Yes Required Braces or Orthoses: Other Brace/Splint Other Brace/Splint: Right UE sling. Restrictions Weight Bearing Restrictions: Yes RUE Weight Bearing: Non weight bearing Other Position/Activity Restrictions: Gentle Flexion and extension of elbow.  NO Active extension.  Pendulums.  shoulder ROM Mobility (including Balance) Bed Mobility Supine to Sit: 6: Modified independent (Device/Increase time);HOB elevated (Comment degrees);With rails (HOB 45 degrees) Sit to Supine: 6: Modified independent (Device/Increase time);HOB elevated (comment degrees);With rail (45 degrees) Transfers Sit to Stand: 6: Modified independent (Device/Increase time);From bed Stand to Sit: 6: Modified independent  (Device/Increase time);To bed;With upper extremity assist Ambulation/Gait Ambulation/Gait Assistance: 5: Supervision Ambulation/Gait Assistance Details (indicate cue type and reason): supervision for safety with hemiwalker.  Verbal cues for safe use of hemiwalker.   Ambulation Distance (Feet): 150 Feet Assistive device: Hemi-walker Gait Pattern: Step-to pattern;Decreased stride length;Trunk flexed Stairs: Yes Stairs Assistance: 5: Supervision Stairs Assistance Details (indicate cue type and reason): supervision for safety Stair Management Technique: One rail Left;Forwards Number of Stairs: 6  Height of Stairs: 4     End of Session PT - End of Session Equipment Utilized During Treatment:  (hemi walker) Activity Tolerance: Patient tolerated treatment well Patient left: in bed;with call bell in reach General Behavior During Session: [facility] for tasks performed Cognition: [facility] for tasks performed  Chelsey Redondo B. Makynna Manocchio, PT, DPT [phone] 07/29/2011, 4:57 PM

## 2011-07-29 NOTE — Progress Notes (Addendum)
Patient doing well; unable to go home secondary to hotel change and husband's night blindness  VSS  RUEx R/M/U sens and motor intact; drsg intact, swelling decreasing  D/C to home now F/u in 7-14 days Continue motion of the elbow, sling when not moving

## 2011-07-29 NOTE — Progress Notes (Signed)
Occupational Therapy Treatment Patient Details Name: Kathryn [name] MRN: [id number] DOB: [birth year] Today's Date: 07/29/2011  OT Assessment/Plan OT Assessment/Plan Comments on Treatment Session: Husband did not come for education again this date.  Pt. with severe edema Rt. hand upon therapist's enterance.  Reinforced need for elevation above level of the heart.  Retrograde massage performed.  Reinforced no active elbow extension.  Pt. feels confident she and husband can manage fine.  Recommend continued HHOT at discharge OT Plan: Discharge plan remains appropriate OT Frequency: Min 2X/week Follow Up Recommendations: Home health OT;Supervision/Assistance - 24 hour Equipment Recommended: None recommended by OT OT Goals ADL Goals ADL Goal: Additional Goal #2 - Progress: Progressing toward goals Arm Goals Arm Goal: Additional Goal #1 - Progress: Progressing toward goals  OT Treatment Precautions/Restrictions  Precautions Precautions: Fall Required Braces or Orthoses: Other Brace/Splint Other Brace/Splint: Right UE sling. Restrictions Weight Bearing Restrictions: Yes RUE Weight Bearing: Non weight bearing Other Position/Activity Restrictions: Gentle Flexion and extension of elbow.  NO Active extension.  Pendulums.  shoulder ROM   ADL ADL Eating/Feeding: Simulated;Modified independent Where Assessed - Eating/Feeding: Bed level ADL Comments: Pt's husband not present for education due to having to stay with the dog.  Pt. was instructed and reiterated the need for elevation of Rt. UE above the level of the heart.  Rt. hand with severe edema this date.  Retrograde massage performed, and pt. was instructed in massage.  Pt. able to verbalize safe/appropriate techniques for UB ADLs, and sling application.  Pt. deferred practice with sling Mobility    Exercises General Exercises - Upper Extremity Shoulder Flexion: AROM;10 reps;Supine;Left Shoulder ABduction: AROM;10 reps;Supine (135) Elbow  Flexion: PROM;10 reps;Right;Supine (~ 30; ~ 90-110) Elbow Extension: PROM;Right;10 reps;Supine (~25 degree arc of movement  ~90 - 65) Wrist Flexion: AROM;10 reps;Right;Supine Wrist Extension: AROM;10 reps;Right;Supine Digit Composite Flexion: AROM;Right;15 reps;Supine Composite Extension: AROM;Right;15 reps;Supine Shoulder Exercises Shoulder External Rotation: AROM;10 reps;Right;Supine (WFL)  End of Session OT - End of Session Equipment Utilized During Treatment: Other (comment) Activity Tolerance: Patient tolerated treatment well Patient left: in bed;with call bell in reach;with bed alarm set Nurse Communication: Other (comment) (Need for pain meds) General Behavior During Session: [facility] for tasks performed Cognition: [facility] for tasks performed  Kathryn [name]  07/29/2011, 10:18 PM

## 2011-07-30 LAB — DRUG SCREEN PANEL (SERUM)

## 2011-07-30 NOTE — Progress Notes (Signed)
Pt discharged to home. Brother in law transported pt to her sisters home. Dressing changed prior to discharge. Incision is clean and approximated. No signs of infection noted. Reviewed with pt how to keep bandage clean and to change if necessary. Has appointment with dr. Carola Frost in one week. Reviewed home care and s&s of infection. Pt left with two prescriptions for oxycodone and ultram.

## 2011-08-05 ENCOUNTER — Emergency Department (INDEPENDENT_AMBULATORY_CARE_PROVIDER_SITE_OTHER): Payer: Medicare Other

## 2011-08-05 ENCOUNTER — Emergency Department (HOSPITAL_BASED_OUTPATIENT_CLINIC_OR_DEPARTMENT_OTHER)
Admission: EM | Admit: 2011-08-05 | Discharge: 2011-08-05 | Disposition: A | Discharge status: Home / Self Care | Payer: Medicare Other | Attending: Emergency Medicine | Admitting: Emergency Medicine

## 2011-08-05 ENCOUNTER — Encounter (HOSPITAL_BASED_OUTPATIENT_CLINIC_OR_DEPARTMENT_OTHER): Payer: Self-pay

## 2011-08-05 DIAGNOSIS — Z981 Arthrodesis status: Secondary | ICD-10-CM | POA: Insufficient documentation

## 2011-08-05 DIAGNOSIS — S0003XA Contusion of scalp, initial encounter: Secondary | ICD-10-CM | POA: Insufficient documentation

## 2011-08-05 DIAGNOSIS — S5000XA Contusion of unspecified elbow, initial encounter: Secondary | ICD-10-CM | POA: Insufficient documentation

## 2011-08-05 DIAGNOSIS — W19XXXA Unspecified fall, initial encounter: Secondary | ICD-10-CM

## 2011-08-05 DIAGNOSIS — S1093XA Contusion of unspecified part of neck, initial encounter: Secondary | ICD-10-CM | POA: Insufficient documentation

## 2011-08-05 DIAGNOSIS — W010XXA Fall on same level from slipping, tripping and stumbling without subsequent striking against object, initial encounter: Secondary | ICD-10-CM | POA: Insufficient documentation

## 2011-08-05 DIAGNOSIS — M25529 Pain in unspecified elbow: Secondary | ICD-10-CM | POA: Insufficient documentation

## 2011-08-05 DIAGNOSIS — Z9889 Other specified postprocedural states: Secondary | ICD-10-CM

## 2011-08-05 DIAGNOSIS — I10 Essential (primary) hypertension: Secondary | ICD-10-CM | POA: Insufficient documentation

## 2011-08-05 DIAGNOSIS — Z043 Encounter for examination and observation following other accident: Secondary | ICD-10-CM

## 2011-08-05 DIAGNOSIS — S5001XA Contusion of right elbow, initial encounter: Secondary | ICD-10-CM

## 2011-08-05 NOTE — Discharge Instructions (Signed)
X-rays of right elbow show no change in her postoperative status, with no change in bone position or hardware.  Reassured and released.   Contusion A contusion is a deep bruise. Contusions happen when an injury causes bleeding under the skin. Signs of bruising include pain, puffiness (swelling), and discolored skin. The contusion may turn blue, purple, or yellow. HOME CARE   Put ice on the injured area.   Put ice in a plastic bag.   Place a towel between your skin and the bag.   Leave the ice on for 15 to 20 minutes, 3 to 4 times a day.   Only take medicine as told by your doctor.   Rest the injured area.   If possible, raise (elevate) the injured area to lessen puffiness.  GET HELP RIGHT AWAY IF:   You have more bruising or puffiness.   You have pain that is getting worse.   Your puffiness or pain is not helped by medicine.  MAKE SURE YOU:   Understand these instructions.   Will watch your condition.   Will get help right away if you are not doing well or get worse.  Document Released: 09/21/2007 Document Revised: 03/24/2011 Document Reviewed: 02/07/2011 [facility] Patient Information 2012 [place], [place].

## 2011-08-05 NOTE — ED Notes (Signed)
Pt states that she fell last night at hit her head on the hardwood floor, recently fell and had broke her R elbow, s/p surgical repair.  Dr Carola Frost would like for [place] to re-xray the arm to see if it has been damaged in the fall.  No loc.minor forehead swelling.

## 2011-08-05 NOTE — ED Notes (Signed)
Per Dr. Ignacia Palma, patient permitted to take her prescribed pain medication for break through pain. 5 mg oxycodone (Oxy IR/ roxicodone) taken at 1815.

## 2011-08-05 NOTE — ED Provider Notes (Signed)
History     CSN: [id number]  Arrival date & time 08/05/11  1658   First MD Initiated Contact with Patient 08/05/11 1728      Chief Complaint  Patient presents with  . Fall  . Head Injury    (Consider location/radiation/quality/duration/timing/severity/associated sxs/prior treatment) HPI Comments: Patient is a 61 year old woman who had recently had a fall, and suffered a complex fracture of her right distal humerus. This had been repaired by Myrene Galas M.D.,, orthopedist. Last night she was walking with noted slippers. She was walking on a hardwood floor. Her Cipro Scott on a nail in the floor and was tripped her and she fell, striking the point of her right elbow where she had her surgery, as well as bumping her for head on the floor. She was not rendered unconscious. There was no bleeding. She had a visit with the visiting nurse today who called her orthopedist, Dr. Carola Frost. He advised patient to go to the ED for HEENT examination x-ray of her right elbow to make sure that she had harmed her surgical repair.  Patient is a 61 y.o. female presenting with fall and head injury. The history is provided by the patient and medical records. No language interpreter was used.  Fall The accident occurred 12 to 24 hours ago. The fall occurred while walking. She fell from a height of 1 to 2 ft. She landed on a hard floor. The point of impact was the head and right elbow. The pain is present in the right elbow. The pain is at a severity of 5/10. The pain is moderate. She was ambulatory at the scene. There was no entrapment after the fall. There was no drug use involved in the accident. There was no alcohol use involved in the accident. Pertinent negatives include no fever. Exacerbated by: Nothing. Prehospitalization: No treatment.  Head Injury     Past Medical History  Diagnosis Date  . Hypertension   . Dysrhythmia   . Arthritis   . Osteoporosis     Past Surgical History  Procedure Date  .  Fracture surgery   . Amputation     two toes on left foot  . Bunionectomy     both feet  . Arthroscopic repair acl     right knee  . Joint replacement     right hip   . Tubal ligation   . Orif humerus fracture 07/26/2011    Procedure: OPEN REDUCTION INTERNAL FIXATION (ORIF) DISTAL HUMERUS FRACTURE;  Surgeon: Budd Palmer, MD;  Location: [facility] OR;  Service: Orthopedics;  Laterality: Right;    History reviewed. No pertinent family history.  History  Substance Use Topics  . Smoking status: Never Smoker   . Smokeless tobacco: Not on file  . Alcohol Use: Yes    OB History    Grav Para Term Preterm Abortions TAB SAB Ect Mult Living                  Review of Systems  Constitutional: Negative.  Negative for fever and chills.  HENT: Negative.   Eyes: Negative.   Respiratory: Negative.   Cardiovascular: Negative.   Gastrointestinal: Positive for constipation.  Genitourinary: Negative.   Musculoskeletal:       Pain in right elbow.  Skin: Negative.   Neurological: Negative.   Psychiatric/Behavioral: Negative.     Allergies  Review of patient's allergies indicates no known allergies.  Home Medications   Current Outpatient Rx  Name Route Sig Dispense Refill  .  CALCIUM + D PO Oral Take 1,600 mg by mouth daily.    Marland Kitchen METOPROLOL SUCCINATE ER 25 MG PO TB24 Oral Take 25 mg by mouth every morning.    . OXYCODONE HCL 5 MG PO TABS Oral Take 1-2 tablets (5-10 mg total) by mouth every 4 (four) hours as needed (BREAKTHROUGH PAIN ONLY). 50 tablet 0  . TRAMADOL HCL 50 MG PO TABS Oral Take 1-2 tablets (50-100 mg total) by mouth every 6 (six) hours as needed for pain. 80 tablet 0    BP 123/71  Pulse 108  Temp(Src) 97.8 F (36.6 C) (Oral)  Resp 20  Ht 5\' 1"  (1.549 m)  Wt 110 lb (49.896 kg)  BMI 20.78 kg/m2  SpO2 94%  Physical Exam  Nursing note and vitals reviewed. Constitutional: She is oriented to person, place, and time.       Slender late middle-aged woman in no distress.    HENT:  Head: Normocephalic.  Right Ear: External ear normal.  Left Ear: External ear normal.  Mouth/Throat: Oropharynx is clear and moist.       She has a 1 cm contusion at the margin of the hairline in the center for for head. There is no palpable bony deformity of the skull and no scalp laceration.  Eyes: Conjunctivae and EOM are normal. Pupils are equal, round, and reactive to light.  Neck: Normal range of motion. Neck supple.  Cardiovascular: Normal rate, regular rhythm and normal heart sounds.   Pulmonary/Chest: Effort normal and breath sounds normal.  Abdominal: Soft.  Musculoskeletal:       She has a long incision from the mid humerus over the olecranon process and down 10 cm onto her right forearm. The wound is stapled. There is contusion which appears old around the area of incision. At the point of the elbow there is a superficial abrasion but no actual opening of the wound. There is no palpable bony deformity of her elbow. She has intact pulses sensation and tendon function in the right hand.  Neurological: She is alert and oriented to person, place, and time.       No sensory or motor deficit.  Skin: Skin is warm and dry.  Psychiatric: She has a normal mood and affect. Her behavior is normal.    ED Course  Procedures (including critical care time)   5:47 PM Pt seen --> physical exam performed, including undressing pt's elbow to inspect her wound.  X-rays of right elbow ordered.  6:59 PM Results for orders placed during the hospital encounter of 07/26/11  GAMMA GT      Component Value Range   GGT 1459 (*) 7 - 51 (U/L)  HEPATITIS PANEL, ACUTE      Component Value Range   Hepatitis B Surface Ag NEGATIVE  NEGATIVE    HCV Ab NEGATIVE  NEGATIVE    Hep A IgM NEGATIVE  NEGATIVE    Hep B C IgM NEGATIVE  NEGATIVE   HEMOGLOBIN A1C      Component Value Range   Hemoglobin A1C 4.9  <5.7 (%)   Mean Plasma Glucose 94  <117 (mg/dL)  DRUG SCREEN PANEL (SERUM)      Component  Value Range   Drug Abuse Screen, Serum REPORT    PREALBUMIN      Component Value Range   Prealbumin 16.1 (*) 17.0 - 34.0 (mg/dL)  VITAMIN D 1,61 DIHYDROXY      Component Value Range   Vitamin D 1, 25 (OH) Total 74 (*)  18 - 72 (pg/mL)   Vitamin D3 1, 25 (OH) 74     Vitamin D2 1, 25 (OH) <8    VITAMIN D 25 HYDROXY      Component Value Range   Vit D, 25-Hydroxy 25 (*) 30 - 89 (ng/mL)  PTH, INTACT AND CALCIUM      Component Value Range   PTH 60.2  14.0 - 72.0 (pg/mL)   Calcium, Total (PTH) 8.4  8.4 - 10.5 (mg/dL)  CALCIUM, IONIZED      Component Value Range   Calcium, Ion 1.08 (*) 1.12 - 1.32 (mmol/L)  LIPASE, BLOOD      Component Value Range   Lipase 13  11 - 59 (U/L)  AMYLASE      Component Value Range   Amylase 43  0 - 105 (U/L)  ALKALINE PHOSPHATASE, ISOENZYMES      Component Value Range   Alk Phos 213 (*) 39 - 117 (U/L)   Alk Phos Bone Fract 129     ALP, Heat Stable (Liver) 84     Alk Phos Liver Fract 39    SURGICAL PCR SCREEN      Component Value Range   MRSA, PCR NEGATIVE  NEGATIVE    Staphylococcus aureus NEGATIVE  NEGATIVE   CBC      Component Value Range   WBC 11.0 (*) 4.0 - 10.5 (K/uL)   RBC 2.77 (*) 3.87 - 5.11 (MIL/uL)   Hemoglobin 10.6 (*) 12.0 - 15.0 (g/dL)   HCT 40.9 (*) 81.1 - 46.0 (%)   MCV 115.5 (*) 78.0 - 100.0 (fL)   MCH 38.3 (*) 26.0 - 34.0 (pg)   MCHC 33.1  30.0 - 36.0 (g/dL)   RDW 91.4  78.2 - 95.6 (%)   Platelets 181  150 - 400 (K/uL)  DIFFERENTIAL      Component Value Range   Neutrophils Relative 86 (*) 43 - 77 (%)   Lymphocytes Relative 6 (*) 12 - 46 (%)   Monocytes Relative 8  3 - 12 (%)   Eosinophils Relative 0  0 - 5 (%)   Basophils Relative 0  0 - 1 (%)   Neutro Abs 9.4 (*) 1.7 - 7.7 (K/uL)   Lymphs Abs 0.7  0.7 - 4.0 (K/uL)   Monocytes Absolute 0.9  0.1 - 1.0 (K/uL)   Eosinophils Absolute 0.0  0.0 - 0.7 (K/uL)   Basophils Absolute 0.0  0.0 - 0.1 (K/uL)   RBC Morphology POLYCHROMASIA PRESENT    COMPREHENSIVE METABOLIC PANEL       Component Value Range   Sodium 138  135 - 145 (mEq/L)   Potassium 3.0 (*) 3.5 - 5.1 (mEq/L)   Chloride 96  96 - 112 (mEq/L)   CO2 28  19 - 32 (mEq/L)   Glucose, Bld 118 (*) 70 - 99 (mg/dL)   BUN 10  6 - 23 (mg/dL)   Creatinine, Ser 2.13 (*) 0.50 - 1.10 (mg/dL)   Calcium 8.8  8.4 - 08.6 (mg/dL)   Total Protein 7.4  6.0 - 8.3 (g/dL)   Albumin 3.2 (*) 3.5 - 5.2 (g/dL)   AST 578 (*) 0 - 37 (U/L)   ALT 23  0 - 35 (U/L)   Alkaline Phosphatase 219 (*) 39 - 117 (U/L)   Total Bilirubin 3.6 (*) 0.3 - 1.2 (mg/dL)   GFR calc non Af Amer >90  >90 (mL/min)   GFR calc Af Amer >90  >90 (mL/min)  PROTIME-INR      Component  Value Range   Prothrombin Time 14.2  11.6 - 15.2 (seconds)   INR 1.08  0.00 - 1.49   APTT      Component Value Range   aPTT 33  24 - 37 (seconds)  URINALYSIS, ROUTINE W REFLEX MICROSCOPIC      Component Value Range   Color, Urine AMBER (*) YELLOW    APPearance HAZY (*) CLEAR    Specific Gravity, Urine 1.034 (*) 1.005 - 1.030    pH 7.0  5.0 - 8.0    Glucose, UA NEGATIVE  NEGATIVE (mg/dL)   Hgb urine dipstick NEGATIVE  NEGATIVE    Bilirubin Urine LARGE (*) NEGATIVE    Ketones, ur 15 (*) NEGATIVE (mg/dL)   Protein, ur 30 (*) NEGATIVE (mg/dL)   Urobilinogen, UA >0.4 (*) 0.0 - 1.0 (mg/dL)   Nitrite POSITIVE (*) NEGATIVE    Leukocytes, UA MODERATE (*) NEGATIVE   URINE MICROSCOPIC-ADD ON      Component Value Range   Squamous Epithelial / LPF FEW (*) RARE    WBC, UA 7-10  <3 (WBC/hpf)   Bacteria, UA RARE  RARE    Urine-Other MUCOUS PRESENT    CBC      Component Value Range   WBC 7.4  4.0 - 10.5 (K/uL)   RBC 1.92 (*) 3.87 - 5.11 (MIL/uL)   Hemoglobin 7.5 (*) 12.0 - 15.0 (g/dL)   HCT 54.0 (*) 98.1 - 46.0 (%)   MCV 115.6 (*) 78.0 - 100.0 (fL)   MCH 39.1 (*) 26.0 - 34.0 (pg)   MCHC 33.8  30.0 - 36.0 (g/dL)   RDW 19.1  47.8 - 29.5 (%)   Platelets 115 (*) 150 - 400 (K/uL)  BASIC METABOLIC PANEL      Component Value Range   Sodium 138  135 - 145 (mEq/L)   Potassium 3.2  (*) 3.5 - 5.1 (mEq/L)   Chloride 103  96 - 112 (mEq/L)   CO2 24  19 - 32 (mEq/L)   Glucose, Bld 109 (*) 70 - 99 (mg/dL)   BUN 6  6 - 23 (mg/dL)   Creatinine, Ser 6.21 (*) 0.50 - 1.10 (mg/dL)   Calcium 7.5 (*) 8.4 - 10.5 (mg/dL)   GFR calc non Af Amer >90  >90 (mL/min)   GFR calc Af Amer >90  >90 (mL/min)  TYPE AND SCREEN      Component Value Range   ABO/RH(D) A NEG     Antibody Screen POS     Sample Expiration 07/30/2011     DAT, IgG NEG     Antibody Identification ANTI-K     PT AG Type NEGATIVE FOR KELL ANTIGEN     Unit Number [id number]     Blood Component Type RBC LR PHER1     Unit division 00     Status of Unit ISSUED,FINAL     Donor AG Type NEGATIVE FOR KELL ANTIGEN     Transfusion Status OK TO TRANSFUSE     Crossmatch Result COMPATIBLE     Unit Number [id number]     Blood Component Type RED CELLS,LR     Unit division 00     Status of Unit ISSUED,FINAL     Donor AG Type NEGATIVE FOR KELL ANTIGEN     Transfusion Status OK TO TRANSFUSE     Crossmatch Result COMPATIBLE    PREPARE RBC (CROSSMATCH)      Component Value Range   Order Confirmation ORDER PROCESSED BY BLOOD BANK    BILIRUBIN, FRACTIONATED(TOT/DIR/INDIR)  Component Value Range   Total Bilirubin 3.8 (*) 0.3 - 1.2 (mg/dL)   Bilirubin, Direct 2.3 (*) 0.0 - 0.3 (mg/dL)   Indirect Bilirubin 1.5 (*) 0.3 - 0.9 (mg/dL)  MAGNESIUM      Component Value Range   Magnesium 0.9 (*) 1.5 - 2.5 (mg/dL)  VITAMIN Z61      Component Value Range   Vitamin B-12 377  211 - 911 (pg/mL)  FOLATE      Component Value Range   Folate 2.5 (*)   IRON AND TIBC      Component Value Range   Iron 15 (*) 42 - 135 (ug/dL)   TIBC 096 (*) 045 - 409 (ug/dL)   Saturation Ratios 11 (*) 20 - 55 (%)   UIBC 120 (*) 125 - 400 (ug/dL)  FERRITIN      Component Value Range   Ferritin 652 (*) 10 - 291 (ng/mL)  RETICULOCYTES      Component Value Range   Retic Ct Pct 5.4 (*) 0.4 - 3.1 (%)   RBC. 1.83 (*) 3.87 - 5.11 (MIL/uL)   Retic Count,  Manual 98.8  19.0 - 186.0 (K/uL)  CBC      Component Value Range   WBC 10.2  4.0 - 10.5 (K/uL)   RBC 3.18 (*) 3.87 - 5.11 (MIL/uL)   Hemoglobin 10.4 (*) 12.0 - 15.0 (g/dL)   HCT 81.1 (*) 91.4 - 46.0 (%)   MCV 93.1  78.0 - 100.0 (fL)   MCH 32.7  26.0 - 34.0 (pg)   MCHC 35.1  30.0 - 36.0 (g/dL)   RDW NOT CALCULATED  11.5 - 15.5 (%)   Platelets 123 (*) 150 - 400 (K/uL)  BASIC METABOLIC PANEL      Component Value Range   Sodium 132 (*) 135 - 145 (mEq/L)   Potassium 3.2 (*) 3.5 - 5.1 (mEq/L)   Chloride 100  96 - 112 (mEq/L)   CO2 24  19 - 32 (mEq/L)   Glucose, Bld 117 (*) 70 - 99 (mg/dL)   BUN 6  6 - 23 (mg/dL)   Creatinine, Ser 7.82 (*) 0.50 - 1.10 (mg/dL)   Calcium 7.5 (*) 8.4 - 10.5 (mg/dL)   GFR calc non Af Amer >90  >90 (mL/min)   GFR calc Af Amer >90  >90 (mL/min)  CBC      Component Value Range   WBC 11.0 (*) 4.0 - 10.5 (K/uL)   RBC 3.26 (*) 3.87 - 5.11 (MIL/uL)   Hemoglobin 10.6 (*) 12.0 - 15.0 (g/dL)   HCT 95.6 (*) 21.3 - 46.0 (%)   MCV 93.6  78.0 - 100.0 (fL)   MCH 32.5  26.0 - 34.0 (pg)   MCHC 34.8  30.0 - 36.0 (g/dL)   RDW NOT CALCULATED  11.5 - 15.5 (%)   Platelets 137 (*) 150 - 400 (K/uL)    Dg Elbow 2 Views Right  08/05/2011  *RADIOLOGY REPORT*  Clinical Data: History of fracture.  Status post fixation 07/27/2011.  RIGHT ELBOW - 2 VIEW  Comparison: Plain films 07/27/2011.  Findings: Medial lateral plate and screws fixate complex distal humerus fracture.  Plate and screws fix an ulnar osteotomy. Position and alignment of the patient's fracture appear near anatomic.  Hardware is intact.  No acute abnormality is identified.  IMPRESSION: Status post ORIF distal humerus fracture.  Postoperative change of ulnar osteotomy also noted.  No acute finding or new abnormality.  Original Report Authenticated By: Bernadene Bell. Maricela Curet, M.D.   Dg  Elbow 2 Views Right  07/27/2011  *RADIOLOGY REPORT*  Clinical Data: Postop.  RIGHT ELBOW - 2 VIEW  Comparison: 1 day prior  Findings: Plate  screw fixation of the radial and ulnar side of the distal humerus.  Improved alignment of the distal humerus fracture. Plate screw fixation of the the olecranon. No acute hardware complication.  IMPRESSION: Interval fixation of distal humerus and olecranon.  Improved alignment, without acute hardware complication.  Original Report Authenticated By: Consuello Bossier, M.D.   Dg Elbow 2 Views Right  07/23/2011  *RADIOLOGY REPORT*  Clinical Data: Pain post fall  RIGHT ELBOW - 2 VIEW  Comparison: None.  Findings: Two views of the right elbow submitted.  There is oblique displaced supracondylar fracture in distal right humerus. Posterior fat pad sign is noted probable due to joint effusion.  IMPRESSION: Oblique displaced fracture in distal right humerus.  Original Report Authenticated By: Natasha Mead, M.D.   Dg Elbow Complete Right  07/27/2011  *RADIOLOGY REPORT*  Clinical Data: ORIF right elbow fracture.  RIGHT ELBOW - COMPLETE 3+ VIEW  Comparison: 07/25/2011  Findings: Multiple intraoperative spot images demonstrate plate and screw fixation of the distal humerus and proximal ulna.  Near anatomic alignment.  No complicating feature.  IMPRESSION: ORIF distal humeral and proximal ulnar fractures.  Original Report Authenticated By: Cyndie Chime, M.D.    Ct Elbow Right W/o Cm  07/25/2011  *RADIOLOGY REPORT*  Clinical Data: Fall 07/23/2011.  Comminuted fracture of the elbow. Operation 07/23/2011.  Three-dimensional reconstructions requested.  CT OF THE RIGHT ELBOW WITHOUT CONTRAST,3-DIMENSIONAL CT IMAGE RENDERING ON INDEPENDENT WORKSTATION  Technique:  Multidetector CT imaging was performed according to the standard protocol. Multiplanar CT image reconstructions were also generated.  Comparison: 07/23/2011.  Findings: Comminuted fracture of the distal humerus is present with intra-articular extension.  The olecranon and radius appear intact. Diffuse swelling is present around the elbow.  The major fracture plane is  transversely oriented in the distal radial metaphysis.  There is a T-shaped fracture of the distal humerus, extending into the ulnar margin of the capitellum. Capitellar fragment is minimally displaced from the trochlear fragment.  There is a transverse/axially oriented fracture through the capitellum and trochlea.  There is there is 1.5 shaft width posterior displacement of the trochlea relative to the distal humeral metaphysis.  Comminution fragments are present within the posterior joint. Radiocapitellar joint is located at the time of imaging.  Common extensor origin appears remain intact.  Common flexor origin is difficult to evaluate due to soft tissue swelling around the epicondyle.  The transversely oriented fracture plane through the ulnar aspect of the epicondyle extends through the expected location of the common flexor origin.  Grossly, triceps tendon appears to remain intact.  Biceps tendon also grossly appears intact.  IMPRESSION: Complex intra-articular distal humerus fracture with multiple fracture planes.  Posterior displacement of trochlear and capitellar fragment.  3-DIMENSIONAL CT IMAGE RENDERING AT INDEPENDENT WORKSTATION:  Technique:  3-dimensional CT images were rendered by post- processing of the original CT data at independent workstation.  The 3-dimensional CT images were interpreted, and findings were reported in the accompanying complete CT report for this study.  Findings:  Three-dimensional reconstructions confirm above findings.  IMPRESSION: As above.  Original Report Authenticated By: Andreas Newport, M.D.   Ct 3d Independent Wkst  07/25/2011  *RADIOLOGY REPORT*  Clinical Data: Fall 07/23/2011.  Comminuted fracture of the elbow. Operation 07/23/2011.  Three-dimensional reconstructions requested.  CT OF THE RIGHT ELBOW WITHOUT CONTRAST,3-DIMENSIONAL CT IMAGE  RENDERING ON INDEPENDENT WORKSTATION  Technique:  Multidetector CT imaging was performed according to the standard protocol.  Multiplanar CT image reconstructions were also generated.  Comparison: 07/23/2011.  Findings: Comminuted fracture of the distal humerus is present with intra-articular extension.  The olecranon and radius appear intact. Diffuse swelling is present around the elbow.  The major fracture plane is transversely oriented in the distal radial metaphysis.  There is a T-shaped fracture of the distal humerus, extending into the ulnar margin of the capitellum. Capitellar fragment is minimally displaced from the trochlear fragment.  There is a transverse/axially oriented fracture through the capitellum and trochlea.  There is there is 1.5 shaft width posterior displacement of the trochlea relative to the distal humeral metaphysis.  Comminution fragments are present within the posterior joint. Radiocapitellar joint is located at the time of imaging.  Common extensor origin appears remain intact.  Common flexor origin is difficult to evaluate due to soft tissue swelling around the epicondyle.  The transversely oriented fracture plane through the ulnar aspect of the epicondyle extends through the expected location of the common flexor origin.  Grossly, triceps tendon appears to remain intact.  Biceps tendon also grossly appears intact.  IMPRESSION: Complex intra-articular distal humerus fracture with multiple fracture planes.  Posterior displacement of trochlear and capitellar fragment.  3-DIMENSIONAL CT IMAGE RENDERING AT INDEPENDENT WORKSTATION:  Technique:  3-dimensional CT images were rendered by post- processing of the original CT data at independent workstation.  The 3-dimensional CT images were interpreted, and findings were reported in the accompanying complete CT report for this study.  Findings:  Three-dimensional reconstructions confirm above findings.  IMPRESSION: As above.  Original Report Authenticated By: Andreas Newport, M.D.    7:01 PM X-rays of right elbow show no change in her postoperative status, with no  change in bone position or hardware.  Reassured and released.     1. Fall   2. Contusion of right elbow              Carleene Cooper III, MD 08/06/11 1229

## 2011-08-05 NOTE — ED Notes (Signed)
Pt right forearm purplish/reddish in color due to surgery x 2 weeks ago. Skin warm and dry, capillary refill <3 sec, radial pulse strong and present.

## 2011-08-05 NOTE — ED Notes (Signed)
Patient states that had surgery on her right arm. Pt was wearing knitted slippers and caught them on a nail in the floor and went down, hit her head on hardwood floor. Denies dizziness, loss of consciousness, dime shape bruise/slight swelling at hairline of forehead.

## 2011-08-25 NOTE — Discharge Summary (Signed)
ORTHOPAEDIC TRAUMA SERVICE DISCHARGE SUMMARY  Patient ID: Kathryn [name] MRN: [id number] DOB/AGE: Aug 05, 1950 61 y.o.  Admit date: 07/26/2011 Discharge date: 07/29/2011   Admission Diagnoses: R distal humerus fracture EtOH dependence Nicotine dependence  HTN GERN  Discharge Diagnoses:  Principal Problem:  *Fracture of humerus, distal, right, closed Active Problems:  ESSENTIAL HYPERTENSION  GERD  OSTEOPOROSIS  LIVER FUNCTION TESTS, ABNORMAL  EtOH dependence  Acute blood loss anemia  Fatty liver  Hypomagnesemia  Hyponatremia   Discharged Condition: good  Hospital Course:  61 y/o RHD female referred to OTS due to complex R distal humerus fracture due to fall from standing height.  xrays and CT scans reviewed with pt and tx options discussed.  After discussion pt agreed to proceed with ORIF.  Given mechanism of injury a complete metabolic bone profile was ordered to evaluate for additional causes of decreased bone density besides alcohol use and nicotine use.  Pt was found to have severely elevated liver enzymes.  After discussion with anesthesia, pt was felt to be stable enough to proceed with surgery.  Pt was taken to the OR on 07/26/2011 for ORIF of the R distal humerus. Pt tolerated the procedure well without issue.  After surgery pt was brought to the PACU for recovery from anesthesia she was transferred to the ortho floor for continued observation, pain control and to begin therapies.  Pt was placed in a posterior long arm splint as well after surgery to provide some additional protection as the pt recovered from general anesthesia.  On POD 1 the pt was doing ok.  She did complain of pain in her R arm but o/w was doing well. We did contact GI for consultation regarding elevated LFT's on POD 1 as well. Pt started with therapies on pod 1. The pts h/h was decreased on pod 1 and given the fact that she was slightly tachycardic, HR of 99 while on BB, it was felt that the pt could benefit from 2  units of PRBC's which she did.  Pt was seen and evaluated by GI and an abdominal U/S was performed which showed diffuse fatty infiltration of the liver.  Pt also experienced some hypomagnesemia and hyponatremia which were corrected as well. On POD 2 pts splint was removed and dressings changed so she could begin gentle passive elbow motion with therapy.  Pain was gradually getting better as well. Ultimately on POD 3 pt was deemed stable for discharge.  Elevated alk phos, ast, alt and GGT were felt to be secondary to alcoholic liver dz.   Consults: GI  Significant Diagnostic Studies: Abdominal U/S: Diffuse fatty infiltration of liver.  Treatments: IV hydration, antibiotics: Ancef, analgesia: Morphine and ultram, oxy IR, anticoagulation: Lovenox, therapies: PT, OT, RN and SW and surgery: 07/26/2011: ORIF R distal humerus  Discharge Exam: Blood pressure 106/74, pulse 97, temperature 100.6 F (38.1 C), temperature source Oral, resp. rate 18, height 5\' 1"  (1.549 m), weight 48.988 kg (108 lb), SpO2 95.00%.  Patient doing well; unable to go home secondary to hotel change and husband's night blindness   VSS  RUEx R/M/U sens and motor intact; drsg intact, swelling decreasing   D/C to home now  F/u in 7-14 days  Continue motion of the elbow, sling when not moving      Disposition: 01-Home or Self Care  Discharge Orders    Future Appointments: Provider: Department: Dept Phone: Center:   08/31/2011 2:45 PM Marcello Moores, PT [facility] [phone] [facility]  Future Orders Please Complete By Expires   Discharge instructions      Comments:   Continue with active motion of hand, passive elbow extension, active flexion   Discharge patient      Comments:   Tomorrow am after nurse dressing change     Medication List  As of 08/25/2011  1:24 PM   STOP taking these medications         oxyCODONE-acetaminophen 5-325 MG per tablet         TAKE these medications         CALCIUM + D PO   Take  1,600 mg by mouth daily.      metoprolol succinate 25 MG 24 hr tablet   Commonly known as: TOPROL-XL   Take 25 mg by mouth every morning.           Discharge instructions and Plan:  NWB R elbow Gentle passive elbow flexion and extension, no active extension due to olecranon osteotomy HHOT/PT Limit/quit alcohol and nicotine Follow up in 10-14 days with ortho and follow up with GI in 3-4 weeks as well Concerned for noncompliance and impaired ability to heal  Signed:  Mearl Latin, PA-C [facility] [phone] (P) 08/25/2011, 1:24 PM

## 2011-08-31 ENCOUNTER — Ambulatory Visit: Payer: Medicare Other | Attending: Orthopedic Surgery | Admitting: Physical Therapy

## 2011-08-31 DIAGNOSIS — M6281 Muscle weakness (generalized): Secondary | ICD-10-CM | POA: Insufficient documentation

## 2011-08-31 DIAGNOSIS — IMO0001 Reserved for inherently not codable concepts without codable children: Secondary | ICD-10-CM | POA: Insufficient documentation

## 2011-08-31 DIAGNOSIS — M25539 Pain in unspecified wrist: Secondary | ICD-10-CM | POA: Insufficient documentation

## 2011-08-31 DIAGNOSIS — M2560 Stiffness of unspecified joint, not elsewhere classified: Secondary | ICD-10-CM | POA: Insufficient documentation

## 2011-09-22 ENCOUNTER — Inpatient Hospital Stay (HOSPITAL_COMMUNITY): Payer: Medicare Other

## 2011-09-22 ENCOUNTER — Encounter (HOSPITAL_COMMUNITY): Payer: Self-pay | Admitting: *Deleted

## 2011-09-22 ENCOUNTER — Emergency Department (HOSPITAL_COMMUNITY): Payer: Medicare Other

## 2011-09-22 ENCOUNTER — Inpatient Hospital Stay (HOSPITAL_COMMUNITY)
Admission: EM | Admit: 2011-09-22 | Discharge: 2011-09-27 | DRG: 433 | Disposition: A | Discharge status: Home / Self Care | Payer: Medicare Other | Attending: Internal Medicine | Admitting: Internal Medicine

## 2011-09-22 DIAGNOSIS — R188 Other ascites: Secondary | ICD-10-CM | POA: Diagnosis present

## 2011-09-22 DIAGNOSIS — E876 Hypokalemia: Secondary | ICD-10-CM | POA: Diagnosis present

## 2011-09-22 DIAGNOSIS — R0609 Other forms of dyspnea: Secondary | ICD-10-CM | POA: Diagnosis present

## 2011-09-22 DIAGNOSIS — R0989 Other specified symptoms and signs involving the circulatory and respiratory systems: Secondary | ICD-10-CM | POA: Diagnosis present

## 2011-09-22 DIAGNOSIS — B3781 Candidal esophagitis: Secondary | ICD-10-CM | POA: Diagnosis present

## 2011-09-22 DIAGNOSIS — R109 Unspecified abdominal pain: Secondary | ICD-10-CM

## 2011-09-22 DIAGNOSIS — M810 Age-related osteoporosis without current pathological fracture: Secondary | ICD-10-CM | POA: Diagnosis present

## 2011-09-22 DIAGNOSIS — F1020 Alcohol dependence, uncomplicated: Secondary | ICD-10-CM | POA: Diagnosis present

## 2011-09-22 DIAGNOSIS — K319 Disease of stomach and duodenum, unspecified: Secondary | ICD-10-CM | POA: Diagnosis present

## 2011-09-22 DIAGNOSIS — K7030 Alcoholic cirrhosis of liver without ascites: Secondary | ICD-10-CM

## 2011-09-22 DIAGNOSIS — K921 Melena: Secondary | ICD-10-CM

## 2011-09-22 DIAGNOSIS — K208 Other esophagitis without bleeding: Secondary | ICD-10-CM | POA: Diagnosis present

## 2011-09-22 DIAGNOSIS — I10 Essential (primary) hypertension: Secondary | ICD-10-CM | POA: Diagnosis present

## 2011-09-22 DIAGNOSIS — K709 Alcoholic liver disease, unspecified: Secondary | ICD-10-CM | POA: Diagnosis present

## 2011-09-22 DIAGNOSIS — E873 Alkalosis: Secondary | ICD-10-CM

## 2011-09-22 DIAGNOSIS — S98139A Complete traumatic amputation of one unspecified lesser toe, initial encounter: Secondary | ICD-10-CM

## 2011-09-22 DIAGNOSIS — Z96649 Presence of unspecified artificial hip joint: Secondary | ICD-10-CM

## 2011-09-22 DIAGNOSIS — K766 Portal hypertension: Secondary | ICD-10-CM | POA: Diagnosis present

## 2011-09-22 DIAGNOSIS — K2210 Ulcer of esophagus without bleeding: Secondary | ICD-10-CM | POA: Diagnosis present

## 2011-09-22 DIAGNOSIS — M129 Arthropathy, unspecified: Secondary | ICD-10-CM | POA: Diagnosis present

## 2011-09-22 DIAGNOSIS — K920 Hematemesis: Secondary | ICD-10-CM | POA: Diagnosis present

## 2011-09-22 DIAGNOSIS — R17 Unspecified jaundice: Secondary | ICD-10-CM

## 2011-09-22 HISTORY — DX: Other ascites: R18.8

## 2011-09-22 HISTORY — PX: PARACENTESIS: SHX844

## 2011-09-22 HISTORY — DX: Personal history of other medical treatment: Z92.89

## 2011-09-22 HISTORY — DX: Peripheral vascular disease, unspecified: I73.9

## 2011-09-22 HISTORY — DX: Alcoholic liver disease, unspecified: K70.9

## 2011-09-22 HISTORY — DX: Pure hypercholesterolemia, unspecified: E78.00

## 2011-09-22 LAB — URINALYSIS, ROUTINE W REFLEX MICROSCOPIC
Glucose, UA: NEGATIVE mg/dL
Hgb urine dipstick: NEGATIVE
Hgb urine dipstick: NEGATIVE
Ketones, ur: 15 mg/dL — AB
Protein, ur: NEGATIVE mg/dL
Specific Gravity, Urine: 1.022 (ref 1.005–1.030)
Urobilinogen, UA: 4 mg/dL — ABNORMAL HIGH (ref 0.0–1.0)
Urobilinogen, UA: 2 mg/dL — ABNORMAL HIGH (ref 0.0–1.0)

## 2011-09-22 LAB — HEPATIC FUNCTION PANEL
Bilirubin, Direct: 1.2 mg/dL — ABNORMAL HIGH (ref 0.0–0.3)
Indirect Bilirubin: 1.6 mg/dL — ABNORMAL HIGH (ref 0.3–0.9)
Total Bilirubin: 2.8 mg/dL — ABNORMAL HIGH (ref 0.3–1.2)

## 2011-09-22 LAB — LACTATE DEHYDROGENASE, PLEURAL OR PERITONEAL FLUID

## 2011-09-22 LAB — AFP TUMOR MARKER: AFP-Tumor Marker: 3.8 ng/mL (ref 0.0–8.0)

## 2011-09-22 LAB — CBC
HCT: 30.8 % — ABNORMAL LOW (ref 36.0–46.0)
Hemoglobin: 10.6 g/dL — ABNORMAL LOW (ref 12.0–15.0)
MCH: 35.3 pg — ABNORMAL HIGH (ref 26.0–34.0)
MCHC: 34.4 g/dL (ref 30.0–36.0)
MCV: 102.7 fL — ABNORMAL HIGH (ref 78.0–100.0)

## 2011-09-22 LAB — URINE MICROSCOPIC-ADD ON

## 2011-09-22 LAB — BODY FLUID CELL COUNT WITH DIFFERENTIAL
Eos, Fluid: 0 %
Lymphs, Fluid: 54 %
Other Cells, Fluid: 0 %

## 2011-09-22 LAB — BASIC METABOLIC PANEL
BUN: 11 mg/dL (ref 6–23)
Creatinine, Ser: 0.68 mg/dL (ref 0.50–1.10)
GFR calc Af Amer: >90 mL/min (ref >90)
GFR calc non Af Amer: >90 mL/min (ref >90)
Glucose, Bld: 108 mg/dL — ABNORMAL HIGH (ref 70–99)

## 2011-09-22 LAB — DIFFERENTIAL
Basophils Relative: 0 % (ref 0–1)
Eosinophils Absolute: 0 10*3/uL (ref 0.0–0.7)
Eosinophils Relative: 0 % (ref 0–5)
Monocytes Absolute: 1.1 10*3/uL — ABNORMAL HIGH (ref 0.1–1.0)
Monocytes Relative: 10 % (ref 3–12)

## 2011-09-22 LAB — ACETAMINOPHEN LEVEL: Acetaminophen (Tylenol), Serum: <15 ug/mL (ref 10–30)

## 2011-09-22 LAB — GLUCOSE, PERITONEAL FLUID: Glucose, Peritoneal Fluid: 110 mg/dL

## 2011-09-22 MED ORDER — SODIUM CHLORIDE 0.9 % IV SOLN
INTRAVENOUS | Status: DC
  Administered 2011-09-22 – 2011-09-23 (×2): via INTRAVENOUS

## 2011-09-22 MED ORDER — HYDROMORPHONE HCL PF 1 MG/ML IJ SOLN
1.0000 mg | Freq: Once | INTRAMUSCULAR | Status: AC
  Administered 2011-09-22: 1 mg via INTRAVENOUS
  Filled 2011-09-22: qty 1

## 2011-09-22 MED ORDER — HYDROCODONE-ACETAMINOPHEN 5-325 MG PO TABS
1.0000 | ORAL_TABLET | Freq: Four times a day (QID) | ORAL | Status: DC | PRN
  Administered 2011-09-22 – 2011-09-25 (×8): 1 via ORAL
  Filled 2011-09-22 (×8): qty 1

## 2011-09-22 MED ORDER — ONDANSETRON HCL 4 MG/2ML IJ SOLN: 4.0000 mg | Freq: Four times a day (QID) | INTRAMUSCULAR | Status: DC | PRN

## 2011-09-22 MED ORDER — POTASSIUM CHLORIDE 10 MEQ/100ML IV SOLN
10.0000 meq | Freq: Once | INTRAVENOUS | Status: AC
  Administered 2011-09-22: 10 meq via INTRAVENOUS
  Filled 2011-09-22: qty 100

## 2011-09-22 MED ORDER — CIPROFLOXACIN IN D5W 400 MG/200ML IV SOLN
400.0000 mg | Freq: Three times a day (TID) | INTRAVENOUS | Status: DC
  Administered 2011-09-22 – 2011-09-24 (×5): 400 mg via INTRAVENOUS
  Filled 2011-09-22 (×7): qty 200

## 2011-09-22 MED ORDER — ONDANSETRON HCL 4 MG/2ML IJ SOLN
4.0000 mg | Freq: Once | INTRAMUSCULAR | Status: AC
  Administered 2011-09-22: 4 mg via INTRAVENOUS
  Filled 2011-09-22: qty 2

## 2011-09-22 MED ORDER — METOPROLOL SUCCINATE ER 25 MG PO TB24
25.0000 mg | ORAL_TABLET | Freq: Every morning | ORAL | Status: DC
  Filled 2011-09-22: qty 1

## 2011-09-22 MED ORDER — POTASSIUM CHLORIDE 10 MEQ/100ML IV SOLN
10.0000 meq | Freq: Once | INTRAVENOUS | Status: AC
  Administered 2011-09-22: 10 meq via INTRAVENOUS
  Filled 2011-09-22 (×2): qty 100

## 2011-09-22 MED ORDER — ONDANSETRON HCL 4 MG PO TABS: 4.0000 mg | ORAL_TABLET | Freq: Four times a day (QID) | ORAL | Status: DC | PRN

## 2011-09-22 MED ORDER — IOHEXOL 300 MG/ML  SOLN
75.0000 mL | Freq: Once | INTRAMUSCULAR | Status: AC | PRN
  Administered 2011-09-22: 75 mL via INTRAVENOUS

## 2011-09-22 MED ORDER — PNEUMOCOCCAL VAC POLYVALENT 25 MCG/0.5ML IJ INJ
0.5000 mL | INJECTION | INTRAMUSCULAR | Status: AC
  Filled 2011-09-22 (×2): qty 0.5

## 2011-09-22 MED ORDER — POTASSIUM CHLORIDE CRYS ER 20 MEQ PO TBCR
40.0000 meq | EXTENDED_RELEASE_TABLET | Freq: Once | ORAL | Status: AC
  Administered 2011-09-22: 40 meq via ORAL
  Filled 2011-09-22 (×2): qty 2

## 2011-09-22 MED ORDER — IOHEXOL 300 MG/ML  SOLN
20.0000 mL | INTRAMUSCULAR | Status: AC
Start: 2011-09-22 — End: 2011-09-22
  Administered 2011-09-22 (×2): 20 mL via ORAL

## 2011-09-22 MED ORDER — PANTOPRAZOLE SODIUM 40 MG IV SOLR
40.0000 mg | Freq: Two times a day (BID) | INTRAVENOUS | Status: DC
  Administered 2011-09-22 – 2011-09-24 (×3): 40 mg via INTRAVENOUS
  Filled 2011-09-22 (×7): qty 40

## 2011-09-22 MED ORDER — PANTOPRAZOLE SODIUM 40 MG PO TBEC
40.0000 mg | DELAYED_RELEASE_TABLET | Freq: Every day | ORAL | Status: DC
  Administered 2011-09-22: 40 mg via ORAL
  Filled 2011-09-22: qty 1

## 2011-09-22 NOTE — ED Notes (Signed)
Patient reports she was told that her liver enzymes were elevated in April.  Patient reports she was told to stop drinking.   Patient stopped drinking etoh in April.  She has noted abd swelling and pain that has increased over the past 2 weeks.  Patient reports n/v since 0300.  Patient states she had diarrhea yesterday.  Patient also reports she is feeling sob due to swelling in her abd

## 2011-09-22 NOTE — Consult Note (Signed)
Referring Provider: Dr. Jomarie Longs Primary Care Physician:  Willow Ora, MD, MD Primary Gastroenterologist:  Gentry Fitz  Reason for Consultation:  Cirrhosis; Melena  HPI: Kathryn [name] is a 61 y.o. female with a history of alcoholic cirrhosis who is admitted due to worsening ascites. Reports 3 weeks of worsening ascites and recurrent nausea and vomiting. Reports not being able to keep down hardly any of what she eats. Yesterday had an episode of black diarrhea. Reports two episodes of coffee grounds emesis today. Denies any bright red blood in her vomitus and denies any BRBPR. +F/C. Reports EGD/colonoscopy 6 years ago by [facility] but results not available at this time. 2.2 Liters of ascitic fluid removed today. Abdominal pain is diffuse and constant and has occurred since the distention. Denies NSAIDs. Used to drink 2-3 mixed drinks 3-4 days per week and states no alcohol since her elbow surgery on 07/26/11.  Past Medical History  Diagnosis Date  . Hypertension   . Dysrhythmia   . Arthritis   . Osteoporosis   . Alcoholic liver disease     Past Surgical History  Procedure Date  . Fracture surgery   . Amputation     two toes on left foot  . Bunionectomy     both feet  . Arthroscopic repair acl     right knee  . Joint replacement     right hip   . Tubal ligation   . Orif humerus fracture 07/26/2011    Procedure: OPEN REDUCTION INTERNAL FIXATION (ORIF) DISTAL HUMERUS FRACTURE;  Surgeon: Budd Palmer, MD;  Location: [facility] OR;  Service: Orthopedics;  Laterality: Right;    Prior to Admission medications   Medication Sig Start Date End Date Taking? Authorizing Provider  Calcium Carbonate-Vitamin D (CALCIUM + D PO) Take 1,600 mg by mouth daily.   Yes Historical Provider, MD  HYDROcodone-acetaminophen (NORCO) 5-325 MG per tablet Take 1 tablet by mouth every 6 (six) hours as needed. For pain   Yes Historical Provider, MD  metoprolol succinate (TOPROL-XL) 25 MG 24 hr tablet Take 25 mg by mouth every  morning.   Yes Historical Provider, MD    Scheduled Meds:   . HYDROmorphone  1 mg Intravenous Once  . iohexol  20 mL Oral Q1 Hr x 2  . ondansetron (ZOFRAN) IV  4 mg Intravenous Once  . pantoprazole  40 mg Oral Q1200  . potassium chloride  10 mEq Intravenous Once  . potassium chloride  10 mEq Intravenous Once  . potassium chloride  40 mEq Oral Once  . DISCONTD: metoprolol succinate  25 mg Oral q morning - 10a   Continuous Infusions:   . sodium chloride 75 mL/hr at 09/22/11 1636   PRN Meds:.HYDROcodone-acetaminophen, iohexol, ondansetron (ZOFRAN) IV, ondansetron  Allergies as of 09/22/2011  . (No Known Allergies)    No family history on file.  History   Social History  . Marital Status: Married    Spouse Name: N/A    Number of Children: N/A  . Years of Education: N/A   Occupational History  . Not on file.   Social History Main Topics  . Smoking status: Never Smoker   . Smokeless tobacco: Not on file  . Alcohol Use: Yes     stopped in april  . Drug Use: No  . Sexually Active:    Other Topics Concern  . Not on file   Social History Narrative  . No narrative on file    Review of Systems: All negative  except as stated above in HPI.  Physical Exam: Vital signs: Filed Vitals:   09/22/11 1627  BP: 84/64  Pulse: 89  Temp: 98.0  Resp: 20     General:   Thin, alert, oriented X 3, pleasant and cooperative in NAD Lungs:  Clear throughout to auscultation Heart:  Regular rate and rhythm; no murmurs, clicks, rubs,  or gallops. Abdomen: Distention with diffuse tenderness with guarding, +BS, no hepatomegaly appreciated   Rectal:  Deferred Ext: no edema Neuro: no asterixis  GI:  Lab Results:  Basename 09/22/11 0859  WBC 11.9*  HGB 10.6*  HCT 30.8*  PLT 177   BMET  Basename 09/22/11 0859  NA 133*  K 2.2*  CL 82*  CO2 41*  GLUCOSE 108*  BUN 11  CREATININE 0.68  CALCIUM 8.8   LFT  Basename 09/22/11 0859  PROT 6.1  ALBUMIN 2.1*  AST 34  ALT 5   ALKPHOS 343*  BILITOT 2.8*  BILIDIR 1.2*  IBILI 1.6*   PT/INR  Basename 09/22/11 0859  LABPROT 16.8*  INR 1.34     Studies/Results: Ct Abdomen Pelvis W Contrast  09/22/2011  *RADIOLOGY REPORT*  Clinical Data: Abdominal pain.  Elevated liver function tests. Abdominal swelling.  History of pancreatitis.  Ethanol dependence.  CT ABDOMEN AND PELVIS WITH CONTRAST  Technique:  Multidetector CT imaging of the abdomen and pelvis was performed following the standard protocol during bolus administration of intravenous contrast.  Contrast: 75mL OMNIPAQUE IOHEXOL 300 MG/ML  SOLN  Comparison: Abdominal ultrasound of 07/27/2011.  Abdominal MRI 10/02/2009.  CT of 04/27/2008.  Findings: Right base atelectasis.  Left base collapse / consolidative change.  Mild cardiomegaly with small bilateral pleural effusions.  Small hiatal hernia with distal esophageal wall thickening; image 88.  Moderate volume abdominal pelvic ascites.  Moderate progressive Cirrhosis. Right hepatic atrophy and left lobe enlargement. Geographic hypoattenuation involving nearly the entire left lobe of the liver.  This is similar configuration to 09/22/2009.  The portal veins and hepatic veins are patent.  No well-defined mass is seen.  Normal spleen, distal stomach.  Mild pancreatic atrophy.  Mucosal hyperenhancement involving the gallbladder is nonspecific.  No biliary ductal dilatation.  Normal adrenal glands.  Punctate left renal calculus.  Normal right kidney.  Porta hepatis nodes are enlarged and slightly increased since the prior.  Example node measures 1.1 cm on image 27 of series 2. Likely reactive.  No retrocrural adenopathy.  A recanalized paraumbilical vein.  Beam hardening artifact degrades evaluation the pelvis (secondary to right hip arthroplasty). Normal colon, appendix, and terminal ileum.  Small bowel normal in caliber.  Mild interloop mesenteric fluid.  No pelvic adenopathy.  Normal urinary bladder.  Anterior uterine fibroid on  image 72.  No adnexal mass.  Large amount of free pelvic fluid.  Right hip arthroplasty.  Avascular necrosis of the left femoral head on image 80.  Mild osteopenia.  Remote left rib fractures.  IMPRESSION:  1.  Moderate cirrhosis and enlargement of the left lobe. Geographic hypoattenuation throughout the majority of the left lobe is favored to be related to heterogeneous steatosis and is grossly similar to 09/22/2009.  Correlation with AFP suggested. Consideration of outpatient pre post contrast abdominal MRI, to exclude otherwise underlying hepatocellular carcinoma recommended. 2.  Ascites with recanalized paraumbilical vein, suggesting portal venous hypertension. 3.  Small hiatal hernia with right base atelectasis and left base collapse / consolidative change.  This could represent atelectasis or infection. 4.  Moderate hiatal hernia with suspicion of  distal esophagitis. 5.  Uterine fibroid. 6.  Right hip arthroplasty with left femoral head avascular necrosis. 7.  Non specific mucosal hyperenhancement within the gallbladder.  Original Report Authenticated By: Consuello Bossier, M.D.    Impression/Plan: 60yo with decompensated cirrhosis with worsening ascites and abdominal pain concerning for spontaneous bacterial peritonitis. Also, with evidence of a recent GI bleed. No encephalopathy. Start empiric antibiotics due to risk of bacterial translocation in a cirrhotic patient with a GI bleed. Change to IV PPI. Doubt variceal bleed. F/U on ascites studies to r/o SBP. EGD planned for the AM. Supportive care. NPO p MN.    LOS: 0 days   Glenis Musolf C.  09/22/2011, 5:16 PM

## 2011-09-22 NOTE — H&P (Addendum)
PCP: None currently  Chief Complaint:  Abdominal pain  HPI: Kathryn [name] is a 60/F diagnosed with Alcoholic liver disease in April after which she quit drinking presents to ER with increasing abdominal pain and distension for 3 weeks. She describes the abdominal pain as diffuse, constant, associated with subjective fevers and chills off and on for 2 weeks. In addition also reports intermittent nausea and vomiting for several weeks, non bloody or bilious. Yesterday she had a loose stool that was black.  Allergies:  No Known Allergies    Past Medical History  Diagnosis Date  . Hypertension   . Dysrhythmia   . Arthritis   . Osteoporosis   . Alcoholic liver disease     Past Surgical History  Procedure Date  . Fracture surgery   . Amputation     two toes on left foot  . Bunionectomy     both feet  . Arthroscopic repair acl     right knee  . Joint replacement     right hip   . Tubal ligation   . Orif humerus fracture 07/26/2011    Procedure: OPEN REDUCTION INTERNAL FIXATION (ORIF) DISTAL HUMERUS FRACTURE;  Surgeon: Budd Palmer, MD;  Location: [facility] OR;  Service: Orthopedics;  Laterality: Right;    Prior to Admission medications   Medication Sig Start Date End Date Taking? Authorizing Provider  Calcium Carbonate-Vitamin D (CALCIUM + D PO) Take 1,600 mg by mouth daily.   Yes Historical Provider, MD  HYDROcodone-acetaminophen (NORCO) 5-325 MG per tablet Take 1 tablet by mouth every 6 (six) hours as needed. For pain   Yes Historical Provider, MD  metoprolol succinate (TOPROL-XL) 25 MG 24 hr tablet Take 25 mg by mouth every morning.   Yes Historical Provider, MD    Social History: Married, lives at home with her husband, denies smoking, quit alcohol in April, based on prior admission, mention of drinking 5drinks/day, now to me reports few drinks in a week  No family history on file. No h/o liver disease  Review of Systems:  Constitutional: Denies fever, chills, diaphoresis,  appetite change and fatigue.  HEENT: Denies photophobia, eye pain, redness, hearing loss, ear pain, congestion, sore throat, rhinorrhea, sneezing, mouth sores, trouble swallowing, neck pain, neck stiffness and tinnitus.   Respiratory: Denies SOB, DOE, cough, chest tightness,  and wheezing.   Cardiovascular: Denies chest pain, palpitations and leg swelling.  Gastrointestinal: Denies nausea, vomiting, abdominal pain, diarrhea, constipation, blood in stool and abdominal distention.  Genitourinary: Denies dysuria, urgency, frequency, hematuria, flank pain and difficulty urinating.  Musculoskeletal: Denies myalgias, back pain, joint swelling, arthralgias and gait problem.  Skin: Denies pallor, rash and wound.  Neurological: Denies dizziness, seizures, syncope, weakness, light-headedness, numbness and headaches.  Hematological: Denies adenopathy. Easy bruising, personal or family bleeding history  Psychiatric/Behavioral: Denies suicidal ideation, mood changes, confusion, nervousness, sleep disturbance and agitation   Physical Exam: Blood pressure 80/56, pulse 89, temperature 98 F (36.7 C), temperature source Oral, resp. rate 20, height 5' (1.524 m), SpO2 95.00%. Gen: AAOx3,  HEENT: PERRLA, scleral icterus, mild pallor CVS: S1S2/RRR, no m/r/g Lungs: CTAB Abd: firm, distended, mild diffuse tenderness, no rebound, fluid thrill, positive bowel sounds Ext: positive edema, c/c Neuro: moves all ext, no localizing signs   Labs on Admission:  Results for orders placed during the hospital encounter of 09/22/11 (from the past 48 hour(s))  URINALYSIS, ROUTINE W REFLEX MICROSCOPIC     Status: Abnormal   Collection Time   09/22/11  8:25 AM  Component Value Range Comment   Color, Urine ORANGE (*) YELLOW  BIOCHEMICALS MAY BE AFFECTED BY COLOR   APPearance CLOUDY (*) CLEAR     Specific Gravity, Urine 1.023  1.005 - 1.030     pH 6.5  5.0 - 8.0     Glucose, UA NEGATIVE  NEGATIVE (mg/dL)    Hgb urine  dipstick NEGATIVE  NEGATIVE     Bilirubin Urine MODERATE (*) NEGATIVE     Ketones, ur 15 (*) NEGATIVE (mg/dL)    Protein, ur NEGATIVE  NEGATIVE (mg/dL)    Urobilinogen, UA 4.0 (*) 0.0 - 1.0 (mg/dL)    Nitrite POSITIVE (*) NEGATIVE     Leukocytes, UA MODERATE (*) NEGATIVE    URINE MICROSCOPIC-ADD ON     Status: Abnormal   Collection Time   09/22/11  8:25 AM      Component Value Range Comment   Squamous Epithelial / LPF MANY (*) RARE     WBC, UA 3-6  <3 (WBC/hpf)    Bacteria, UA MANY (*) RARE     Urine-Other MUCOUS PRESENT     CBC     Status: Abnormal   Collection Time   09/22/11  8:59 AM      Component Value Range Comment   WBC 11.9 (*) 4.0 - 10.5 (K/uL)    RBC 3.00 (*) 3.87 - 5.11 (MIL/uL)    Hemoglobin 10.6 (*) 12.0 - 15.0 (g/dL)    HCT 16.1 (*) 09.6 - 46.0 (%)    MCV 102.7 (*) 78.0 - 100.0 (fL)    MCH 35.3 (*) 26.0 - 34.0 (pg)    MCHC 34.4  30.0 - 36.0 (g/dL)    RDW 04.5  40.9 - 81.1 (%)    Platelets 177  150 - 400 (K/uL)   DIFFERENTIAL     Status: Abnormal   Collection Time   09/22/11  8:59 AM      Component Value Range Comment   Neutrophils Relative 72  43 - 77 (%)    Neutro Abs 8.6 (*) 1.7 - 7.7 (K/uL)    Lymphocytes Relative 18  12 - 46 (%)    Lymphs Abs 2.1  0.7 - 4.0 (K/uL)    Monocytes Relative 10  3 - 12 (%)    Monocytes Absolute 1.1 (*) 0.1 - 1.0 (K/uL)    Eosinophils Relative 0  0 - 5 (%)    Eosinophils Absolute 0.0  0.0 - 0.7 (K/uL)    Basophils Relative 0  0 - 1 (%)    Basophils Absolute 0.0  0.0 - 0.1 (K/uL)   BASIC METABOLIC PANEL     Status: Abnormal   Collection Time   09/22/11  8:59 AM      Component Value Range Comment   Sodium 133 (*) 135 - 145 (mEq/L)    Potassium 2.2 (*) 3.5 - 5.1 (mEq/L)    Chloride 82 (*) 96 - 112 (mEq/L)    CO2 41 (*) 19 - 32 (mEq/L)    Glucose, Bld 108 (*) 70 - 99 (mg/dL)    BUN 11  6 - 23 (mg/dL)    Creatinine, Ser 9.14  0.50 - 1.10 (mg/dL)    Calcium 8.8  8.4 - 10.5 (mg/dL)    GFR calc non Af Amer >90  >90 (mL/min)    GFR  calc Af Amer >90  >90 (mL/min)   HEPATIC FUNCTION PANEL     Status: Abnormal   Collection Time   09/22/11  8:59 AM  Component Value Range Comment   Total Protein 6.1  6.0 - 8.3 (g/dL)    Albumin 2.1 (*) 3.5 - 5.2 (g/dL)    AST 34  0 - 37 (U/L)    ALT 5  0 - 35 (U/L)    Alkaline Phosphatase 343 (*) 39 - 117 (U/L)    Total Bilirubin 2.8 (*) 0.3 - 1.2 (mg/dL)    Bilirubin, Direct 1.2 (*) 0.0 - 0.3 (mg/dL)    Indirect Bilirubin 1.6 (*) 0.3 - 0.9 (mg/dL)   GAMMA GT     Status: Abnormal   Collection Time   09/22/11  8:59 AM      Component Value Range Comment   GGT 441 (*) 7 - 51 (U/L)   ACETAMINOPHEN LEVEL     Status: Normal   Collection Time   09/22/11  8:59 AM      Component Value Range Comment   Acetaminophen (Tylenol), Serum <15.0  10 - 30 (ug/mL)   PROTIME-INR     Status: Abnormal   Collection Time   09/22/11  8:59 AM      Component Value Range Comment   Prothrombin Time 16.8 (*) 11.6 - 15.2 (seconds)    INR 1.34  0.00 - 1.49    URINALYSIS, ROUTINE W REFLEX MICROSCOPIC     Status: Abnormal   Collection Time   09/22/11  9:46 AM      Component Value Range Comment   Color, Urine ORANGE (*) YELLOW  BIOCHEMICALS MAY BE AFFECTED BY COLOR   APPearance CLEAR  CLEAR     Specific Gravity, Urine 1.022  1.005 - 1.030     pH 7.5  5.0 - 8.0     Glucose, UA NEGATIVE  NEGATIVE (mg/dL)    Hgb urine dipstick NEGATIVE  NEGATIVE     Bilirubin Urine MODERATE (*) NEGATIVE     Ketones, ur 15 (*) NEGATIVE (mg/dL)    Protein, ur NEGATIVE  NEGATIVE (mg/dL)    Urobilinogen, UA 2.0 (*) 0.0 - 1.0 (mg/dL)    Nitrite POSITIVE (*) NEGATIVE     Leukocytes, UA SMALL (*) NEGATIVE    URINE MICROSCOPIC-ADD ON     Status: Abnormal   Collection Time   09/22/11  9:46 AM      Component Value Range Comment   WBC, UA 3-6  <3 (WBC/hpf)    Bacteria, UA FEW (*) RARE     Casts HYALINE CASTS (*) NEGATIVE     Urine-Other MUCOUS PRESENT       Radiological Exams on Admission: Ct Abdomen Pelvis W Contrast  09/22/2011   *RADIOLOGY REPORT*  Clinical Data: Abdominal pain.  Elevated liver function tests. Abdominal swelling.  History of pancreatitis.  Ethanol dependence.  CT ABDOMEN AND PELVIS WITH CONTRAST  Technique:  Multidetector CT imaging of the abdomen and pelvis was performed following the standard protocol during bolus administration of intravenous contrast.  Contrast: 75mL OMNIPAQUE IOHEXOL 300 MG/ML  SOLN  Comparison: Abdominal ultrasound of 07/27/2011.  Abdominal MRI 10/02/2009.  CT of 04/27/2008.  Findings: Right base atelectasis.  Left base collapse / consolidative change.  Mild cardiomegaly with small bilateral pleural effusions.  Small hiatal hernia with distal esophageal wall thickening; image 88.  Moderate volume abdominal pelvic ascites.  Moderate progressive Cirrhosis. Right hepatic atrophy and left lobe enlargement. Geographic hypoattenuation involving nearly the entire left lobe of the liver.  This is similar configuration to 09/22/2009.  The portal veins and hepatic veins are patent.  No well-defined mass is seen.  Normal spleen, distal stomach.  Mild pancreatic atrophy.  Mucosal hyperenhancement involving the gallbladder is nonspecific.  No biliary ductal dilatation.  Normal adrenal glands.  Punctate left renal calculus.  Normal right kidney.  Porta hepatis nodes are enlarged and slightly increased since the prior.  Example node measures 1.1 cm on image 27 of series 2. Likely reactive.  No retrocrural adenopathy.  A recanalized paraumbilical vein.  Beam hardening artifact degrades evaluation the pelvis (secondary to right hip arthroplasty). Normal colon, appendix, and terminal ileum.  Small bowel normal in caliber.  Mild interloop mesenteric fluid.  No pelvic adenopathy.  Normal urinary bladder.  Anterior uterine fibroid on image 72.  No adnexal mass.  Large amount of free pelvic fluid.  Right hip arthroplasty.  Avascular necrosis of the left femoral head on image 80.  Mild osteopenia.  Remote left rib  fractures.  IMPRESSION:  1.  Moderate cirrhosis and enlargement of the left lobe. Geographic hypoattenuation throughout the majority of the left lobe is favored to be related to heterogeneous steatosis and is grossly similar to 09/22/2009.  Correlation with AFP suggested. Consideration of outpatient pre post contrast abdominal MRI, to exclude otherwise underlying hepatocellular carcinoma recommended. 2.  Ascites with recanalized paraumbilical vein, suggesting portal venous hypertension. 3.  Small hiatal hernia with right base atelectasis and left base collapse / consolidative change.  This could represent atelectasis or infection. 4.  Moderate hiatal hernia with suspicion of distal esophagitis. 5.  Uterine fibroid. 6.  Right hip arthroplasty with left femoral head avascular necrosis. 7.  Non specific mucosal hyperenhancement within the gallbladder.  Original Report Authenticated By: Consuello Bossier, M.D.    Assessment/Plan 1. Decompensated cirrhosis with  Ascites Recent quit ETOH, 2 months ago Hepatitis panel negative in April CT with evidence of portal hypertension Will get diagnostic and therapeutic paracentesis to r/o SBP, and SAAG Check AFP Will request GI eval(unassigned) Type and screen Also reports 1 episode of melena yesterday Add PPI 2. Alcoholic liver disease 3. Hypokalemia-replace, monitor on Tele today 4. Hypercarbia: suspect contraction alkalosis from third spacing 5. DVT prophylaxis: SCDS    Time Spent on Admission:  Lyvonne Cassell [facility] Pager: [phone] 09/22/2011, 3:52 PM

## 2011-09-22 NOTE — ED Provider Notes (Signed)
History     CSN: [id number]  Arrival date & time 09/22/11  8469   First MD Initiated Contact with Patient 09/22/11 [phone]      Chief Complaint  Patient presents with  . Abdominal Pain    (Consider location/radiation/quality/duration/timing/severity/associated sxs/prior treatment) Patient is a 61 y.o. female presenting with abdominal pain. The history is provided by the patient.  Abdominal Pain The primary symptoms of the illness include abdominal pain.  She has noticed abdominal distention associated with abdominal pain and nausea and vomiting for the last 3 weeks. She states during that time that she has not been able to think down. She denies diarrhea. She has chronic constipation related to her chronic narcotic use which has not changed. No for pain related to a recent humerus fracture and she states she takes 2-3 tablets a day and denies taking any over-the-counter acetaminophen. She has had subjective fever and chills but has not taken her temperature. Abdominal pain is diffuse and without radiation. Current pain is 6/10 but pain has been as severe as 10 over 10. She was hospitalized in April and told that she had liver disease secondary to alcohol and was told to stop drinking she states she has not had any alcohol since then.  Past Medical History  Diagnosis Date  . Hypertension   . Dysrhythmia   . Arthritis   . Osteoporosis     Past Surgical History  Procedure Date  . Fracture surgery   . Amputation     two toes on left foot  . Bunionectomy     both feet  . Arthroscopic repair acl     right knee  . Joint replacement     right hip   . Tubal ligation   . Orif humerus fracture 07/26/2011    Procedure: OPEN REDUCTION INTERNAL FIXATION (ORIF) DISTAL HUMERUS FRACTURE;  Surgeon: Budd Palmer, MD;  Location: [facility] OR;  Service: Orthopedics;  Laterality: Right;    No family history on file.  History  Substance Use Topics  . Smoking status: Never Smoker   . Smokeless  tobacco: Not on file  . Alcohol Use: Yes     stopped in april    OB History    Grav Para Term Preterm Abortions TAB SAB Ect Mult Living                  Review of Systems  Gastrointestinal: Positive for abdominal pain.  All other systems reviewed and are negative.    Allergies  Review of patient's allergies indicates no known allergies.  Home Medications   Current Outpatient Rx  Name Route Sig Dispense Refill  . CALCIUM + D PO Oral Take 1,600 mg by mouth daily.    Marland Kitchen METOPROLOL SUCCINATE ER 25 MG PO TB24 Oral Take 25 mg by mouth every morning.      BP 94/78  Pulse 118  Temp(Src) 98.8 F (37.1 C) (Oral)  Ht 5' (1.524 m)  SpO2 97%  Physical Exam  Nursing note and vitals reviewed.  39-year-old female who is resting comfortably and in no acute distress. Vital signs are significant for tachycardia with heart rate of 118. Oxygen saturation is 97% which is normal. Head is normocephalic and atraumatic. PERRLA, EOMI. Moderate scleral icterus is present. Oropharynx is clear. Neck is nontender and supple. Back is nontender. Lungs are clear without rales, wheezes, or rhonchi. Heart has regular rate and rhythm without murmur. Abdomen is distended, soft, with mild tenderness diffusely. Marked ascites is  present with a positive fluid wave. Peristalsis is normal active. Extremities have no cyanosis or edema. Skin is warm and dry without rash. She is somewhat pale.Marland Kitchen Neurologic: Mental status is normal, cranial nerves are intact, there are no motor or sensory deficits.  ED Course  Procedures (including critical care time)  Results for orders placed during the hospital encounter of 09/22/11  CBC      Component Value Range   WBC 11.9 (*) 4.0 - 10.5 (K/uL)   RBC 3.00 (*) 3.87 - 5.11 (MIL/uL)   Hemoglobin 10.6 (*) 12.0 - 15.0 (g/dL)   HCT 14.7 (*) 82.9 - 46.0 (%)   MCV 102.7 (*) 78.0 - 100.0 (fL)   MCH 35.3 (*) 26.0 - 34.0 (pg)   MCHC 34.4  30.0 - 36.0 (g/dL)   RDW 56.2  13.0 - 86.5 (%)     Platelets 177  150 - 400 (K/uL)  DIFFERENTIAL      Component Value Range   Neutrophils Relative 72  43 - 77 (%)   Neutro Abs 8.6 (*) 1.7 - 7.7 (K/uL)   Lymphocytes Relative 18  12 - 46 (%)   Lymphs Abs 2.1  0.7 - 4.0 (K/uL)   Monocytes Relative 10  3 - 12 (%)   Monocytes Absolute 1.1 (*) 0.1 - 1.0 (K/uL)   Eosinophils Relative 0  0 - 5 (%)   Eosinophils Absolute 0.0  0.0 - 0.7 (K/uL)   Basophils Relative 0  0 - 1 (%)   Basophils Absolute 0.0  0.0 - 0.1 (K/uL)  BASIC METABOLIC PANEL      Component Value Range   Sodium 133 (*) 135 - 145 (mEq/L)   Potassium 2.2 (*) 3.5 - 5.1 (mEq/L)   Chloride 82 (*) 96 - 112 (mEq/L)   CO2 41 (*) 19 - 32 (mEq/L)   Glucose, Bld 108 (*) 70 - 99 (mg/dL)   BUN 11  6 - 23 (mg/dL)   Creatinine, Ser 7.84  0.50 - 1.10 (mg/dL)   Calcium 8.8  8.4 - 69.6 (mg/dL)   GFR calc non Af Amer >90  >90 (mL/min)   GFR calc Af Amer >90  >90 (mL/min)  HEPATIC FUNCTION PANEL      Component Value Range   Total Protein 6.1  6.0 - 8.3 (g/dL)   Albumin 2.1 (*) 3.5 - 5.2 (g/dL)   AST 34  0 - 37 (U/L)   ALT 5  0 - 35 (U/L)   Alkaline Phosphatase 343 (*) 39 - 117 (U/L)   Total Bilirubin 2.8 (*) 0.3 - 1.2 (mg/dL)   Bilirubin, Direct 1.2 (*) 0.0 - 0.3 (mg/dL)   Indirect Bilirubin 1.6 (*) 0.3 - 0.9 (mg/dL)  GAMMA GT      Component Value Range   GGT 441 (*) 7 - 51 (U/L)  URINALYSIS, ROUTINE W REFLEX MICROSCOPIC      Component Value Range   Color, Urine ORANGE (*) YELLOW    APPearance CLOUDY (*) CLEAR    Specific Gravity, Urine 1.023  1.005 - 1.030    pH 6.5  5.0 - 8.0    Glucose, UA NEGATIVE  NEGATIVE (mg/dL)   Hgb urine dipstick NEGATIVE  NEGATIVE    Bilirubin Urine MODERATE (*) NEGATIVE    Ketones, ur 15 (*) NEGATIVE (mg/dL)   Protein, ur NEGATIVE  NEGATIVE (mg/dL)   Urobilinogen, UA 4.0 (*) 0.0 - 1.0 (mg/dL)   Nitrite POSITIVE (*) NEGATIVE    Leukocytes, UA MODERATE (*) NEGATIVE   ACETAMINOPHEN LEVEL  Component Value Range   Acetaminophen (Tylenol), Serum  <15.0  10 - 30 (ug/mL)  PROTIME-INR      Component Value Range   Prothrombin Time 16.8 (*) 11.6 - 15.2 (seconds)   INR 1.34  0.00 - 1.49   URINE MICROSCOPIC-ADD ON      Component Value Range   Squamous Epithelial / LPF MANY (*) RARE    WBC, UA 3-6  <3 (WBC/hpf)   Bacteria, UA MANY (*) RARE    Urine-Other MUCOUS PRESENT    URINALYSIS, ROUTINE W REFLEX MICROSCOPIC      Component Value Range   Color, Urine ORANGE (*) YELLOW    APPearance CLEAR  CLEAR    Specific Gravity, Urine 1.022  1.005 - 1.030    pH 7.5  5.0 - 8.0    Glucose, UA NEGATIVE  NEGATIVE (mg/dL)   Hgb urine dipstick NEGATIVE  NEGATIVE    Bilirubin Urine MODERATE (*) NEGATIVE    Ketones, ur 15 (*) NEGATIVE (mg/dL)   Protein, ur NEGATIVE  NEGATIVE (mg/dL)   Urobilinogen, UA 2.0 (*) 0.0 - 1.0 (mg/dL)   Nitrite POSITIVE (*) NEGATIVE    Leukocytes, UA SMALL (*) NEGATIVE   URINE MICROSCOPIC-ADD ON      Component Value Range   WBC, UA 3-6  <3 (WBC/hpf)   Bacteria, UA FEW (*) RARE    Casts HYALINE CASTS (*) NEGATIVE    Urine-Other MUCOUS PRESENT     Ct Abdomen Pelvis W Contrast  09/22/2011  *RADIOLOGY REPORT*  Clinical Data: Abdominal pain.  Elevated liver function tests. Abdominal swelling.  History of pancreatitis.  Ethanol dependence.  CT ABDOMEN AND PELVIS WITH CONTRAST  Technique:  Multidetector CT imaging of the abdomen and pelvis was performed following the standard protocol during bolus administration of intravenous contrast.  Contrast: 75mL OMNIPAQUE IOHEXOL 300 MG/ML  SOLN  Comparison: Abdominal ultrasound of 07/27/2011.  Abdominal MRI 10/02/2009.  CT of 04/27/2008.  Findings: Right base atelectasis.  Left base collapse / consolidative change.  Mild cardiomegaly with small bilateral pleural effusions.  Small hiatal hernia with distal esophageal wall thickening; image 88.  Moderate volume abdominal pelvic ascites.  Moderate progressive Cirrhosis. Right hepatic atrophy and left lobe enlargement. Geographic hypoattenuation  involving nearly the entire left lobe of the liver.  This is similar configuration to 09/22/2009.  The portal veins and hepatic veins are patent.  No well-defined mass is seen.  Normal spleen, distal stomach.  Mild pancreatic atrophy.  Mucosal hyperenhancement involving the gallbladder is nonspecific.  No biliary ductal dilatation.  Normal adrenal glands.  Punctate left renal calculus.  Normal right kidney.  Porta hepatis nodes are enlarged and slightly increased since the prior.  Example node measures 1.1 cm on image 27 of series 2. Likely reactive.  No retrocrural adenopathy.  A recanalized paraumbilical vein.  Beam hardening artifact degrades evaluation the pelvis (secondary to right hip arthroplasty). Normal colon, appendix, and terminal ileum.  Small bowel normal in caliber.  Mild interloop mesenteric fluid.  No pelvic adenopathy.  Normal urinary bladder.  Anterior uterine fibroid on image 72.  No adnexal mass.  Large amount of free pelvic fluid.  Right hip arthroplasty.  Avascular necrosis of the left femoral head on image 80.  Mild osteopenia.  Remote left rib fractures.  IMPRESSION:  1.  Moderate cirrhosis and enlargement of the left lobe. Geographic hypoattenuation throughout the majority of the left lobe is favored to be related to heterogeneous steatosis and is grossly similar to 09/22/2009.  Correlation  with AFP suggested. Consideration of outpatient pre post contrast abdominal MRI, to exclude otherwise underlying hepatocellular carcinoma recommended. 2.  Ascites with recanalized paraumbilical vein, suggesting portal venous hypertension. 3.  Small hiatal hernia with right base atelectasis and left base collapse / consolidative change.  This could represent atelectasis or infection. 4.  Moderate hiatal hernia with suspicion of distal esophagitis. 5.  Uterine fibroid. 6.  Right hip arthroplasty with left femoral head avascular necrosis. 7.  Non specific mucosal hyperenhancement within the gallbladder.   Original Report Authenticated By: Consuello Bossier, M.D.      Date: 09/22/2011  Rate: 103  Rhythm: sinus tachycardia  QRS Axis: normal  Intervals: normal  ST/T Wave abnormalities: nonspecific T wave changes  Conduction Disutrbances:none  Narrative Interpretation: Sinus tachycardia, Q waves in the inferior leads consistent with old inferior wall myocardial infarction, loss of R waves in the anterolateral leads consistent with old anterolateral myocardial infarction, nonspecific T wave flattening. When compared with ECG of 07/26/2011, no significant changes are seen.  Old EKG Reviewed: unchanged    1. Ascites   2. Abdominal pain   3. Jaundice   4. Hypokalemia   5. Metabolic alkalosis     CRITICAL CARE Performed by: Dione Booze   Total critical care time: 40 minutes  Critical care time was exclusive of separately billable procedures and treating other patients.  Critical care was necessary to treat or prevent imminent or life-threatening deterioration.  Critical care was time spent personally by me on the following activities: development of treatment plan with patient and/or surrogate as well as nursing, discussions with consultants, evaluation of patient's response to treatment, examination of patient, obtaining history from patient or surrogate, ordering and performing treatments and interventions, ordering and review of laboratory studies, ordering and review of radiographic studies, pulse oximetry and re-evaluation of patient's condition.   MDM  Old records were reviewed. Hospitalization for humerus fracture uncovered significant abnormalities of liver function and the patient was advised to stop drinking. Of note, ultrasound showed evidence of fatty liver but no ascites. She has developed significant ascites in the interim. Obtained to further evaluate her abdominal pain and her liver. Acetaminophen and level will also be checked given the fact that she has been taking narcotic  chronic pain medication which contains acetaminophen and acetaminophen toxicity may be superimposed on underlying alcoholic liver disease.  Urinalysis has come back as a contaminated specimen with many epithelial cells. Positive nitrite may be due to color interference from bilirubin in the urine. Therefore, catheterized urine specimen will be obtained and urine sent for culture. Clinically, she has no evidence of urinary tract infection.  CT scan is read by radiologist as showing evidence of hepatic steatosis and I have reviewed the images. Potassium has come back severely low and she's given IV and oral potassium. Serum chloride has come back very high. This reportedly represents a volume contraction alkalosis with lying contraction related to her ascites. Her alkaline phosphatase has increased over baseline but transaminases have decreased. GGT has decreased over baseline. Case is been discussed with Dr. Jomarie Longs of [facility] who agrees to that the patient. Because of abdominal pain, history of fever, and new onset ascites, paracentesis will be done to rule out spontaneous bacterial peritonitis. She may require additional workup to evaluate why her ascites occurred so suddenly.      Dione Booze, MD 09/22/11 [phone]

## 2011-09-22 NOTE — Progress Notes (Signed)
Received pt. From ED.pt. Is from home with husband,alert and oriented.pt. Uses walker,and wheelchair at home.pt's skin is intact without pressure sore.Md. Was call when pt. Arrived to floor due to low BP:80's/50's.orders were received.keep monitoring pt. Closely.

## 2011-09-22 NOTE — Procedures (Signed)
US guided RLQ para  2.2 liters- yellow Pt tolerated well BP stable  180 cc sent to lab per MD

## 2011-09-22 NOTE — Progress Notes (Signed)
MD notified due to patients BP being 70/52 automatic and 76/49 manuel.  Patient had a paracentesis earlier today and it was at that time that her BP was noted to have dropped.  MD on morning shift ordered a low dose of IV fluids at 75 ml/hr and night MD stated to continue to monitor her and if needed over in the night, we would give a bolus.   Patient is resting and arousable showing no signs and symptoms of distress. Will continue to monitor.

## 2011-09-22 NOTE — ED Notes (Signed)
Dr.Glick notified of critical lab values.

## 2011-09-22 NOTE — ED Notes (Signed)
MD in room at this time.

## 2011-09-23 ENCOUNTER — Encounter (HOSPITAL_COMMUNITY): Payer: Self-pay | Admitting: *Deleted

## 2011-09-23 ENCOUNTER — Encounter (HOSPITAL_COMMUNITY)
Admission: EM | Disposition: A | Discharge status: Home / Self Care | Payer: Self-pay | Source: Home / Self Care | Attending: Internal Medicine

## 2011-09-23 DIAGNOSIS — E876 Hypokalemia: Secondary | ICD-10-CM

## 2011-09-23 DIAGNOSIS — K7030 Alcoholic cirrhosis of liver without ascites: Secondary | ICD-10-CM

## 2011-09-23 DIAGNOSIS — K921 Melena: Secondary | ICD-10-CM

## 2011-09-23 DIAGNOSIS — R188 Other ascites: Secondary | ICD-10-CM

## 2011-09-23 HISTORY — PX: ESOPHAGOGASTRODUODENOSCOPY: SHX5428

## 2011-09-23 LAB — URINE CULTURE: Culture: NO GROWTH

## 2011-09-23 LAB — COMPREHENSIVE METABOLIC PANEL
ALT: <5 U/L (ref 0–35)
AST: 25 U/L (ref 0–37)
Albumin: 1.6 g/dL — ABNORMAL LOW (ref 3.5–5.2)
Alkaline Phosphatase: 247 U/L — ABNORMAL HIGH (ref 39–117)
Chloride: 88 mEq/L — ABNORMAL LOW (ref 96–112)
Potassium: 2.6 mEq/L — CL (ref 3.5–5.1)
Sodium: 131 mEq/L — ABNORMAL LOW (ref 135–145)
Total Bilirubin: 1.9 mg/dL — ABNORMAL HIGH (ref 0.3–1.2)
Total Protein: 5 g/dL — ABNORMAL LOW (ref 6.0–8.3)

## 2011-09-23 LAB — CBC
Hemoglobin: 9.2 g/dL — ABNORMAL LOW (ref 12.0–15.0)
MCH: 34.2 pg — ABNORMAL HIGH (ref 26.0–34.0)
MCHC: 33.7 g/dL (ref 30.0–36.0)
Platelets: 151 10*3/uL (ref 150–400)
RDW: 15.1 % (ref 11.5–15.5)

## 2011-09-23 SURGERY — EGD (ESOPHAGOGASTRODUODENOSCOPY)
Anesthesia: Moderate Sedation

## 2011-09-23 MED ORDER — BOOST / RESOURCE BREEZE PO LIQD
1.0000 | Freq: Two times a day (BID) | ORAL | Status: DC
  Administered 2011-09-23 – 2011-09-27 (×8): 1 via ORAL

## 2011-09-23 MED ORDER — MIDAZOLAM HCL 10 MG/2ML IJ SOLN
INTRAMUSCULAR | Status: DC | PRN
  Administered 2011-09-23 (×2): 1 mg via INTRAVENOUS

## 2011-09-23 MED ORDER — MIDAZOLAM HCL 10 MG/2ML IJ SOLN
INTRAMUSCULAR | Status: AC
  Filled 2011-09-23: qty 4

## 2011-09-23 MED ORDER — BUTAMBEN-TETRACAINE-BENZOCAINE 2-2-14 % EX AERO
INHALATION_SPRAY | CUTANEOUS | Status: DC | PRN
  Administered 2011-09-23 (×2): 1 via TOPICAL

## 2011-09-23 MED ORDER — POTASSIUM CHLORIDE 10 MEQ/100ML IV SOLN
10.0000 meq | INTRAVENOUS | Status: AC
  Administered 2011-09-23 (×6): 10 meq via INTRAVENOUS
  Filled 2011-09-23 (×6): qty 100

## 2011-09-23 MED ORDER — DIPHENHYDRAMINE HCL 50 MG/ML IJ SOLN
INTRAMUSCULAR | Status: AC
  Filled 2011-09-23: qty 1

## 2011-09-23 MED ORDER — FENTANYL CITRATE 0.05 MG/ML IJ SOLN
INTRAMUSCULAR | Status: AC
  Filled 2011-09-23: qty 2

## 2011-09-23 MED ORDER — FENTANYL CITRATE 0.05 MG/ML IJ SOLN
INTRAMUSCULAR | Status: AC
  Filled 2011-09-23: qty 4

## 2011-09-23 MED ORDER — SODIUM CHLORIDE 0.9 % IV SOLN: Freq: Once | INTRAVENOUS | Status: DC

## 2011-09-23 MED ORDER — MIDAZOLAM HCL 10 MG/2ML IJ SOLN
INTRAMUSCULAR | Status: AC
  Filled 2011-09-23: qty 2

## 2011-09-23 MED ORDER — NYSTATIN 100000 UNIT/ML MT SUSP
5.0000 mL | Freq: Four times a day (QID) | OROMUCOSAL | Status: DC
  Administered 2011-09-23 – 2011-09-27 (×16): 500000 [IU] via ORAL
  Filled 2011-09-23 (×20): qty 5

## 2011-09-23 MED ORDER — FENTANYL NICU IV SYRINGE 50 MCG/ML
INJECTION | INTRAMUSCULAR | Status: DC | PRN
  Administered 2011-09-23 (×2): 12.5 ug via INTRAVENOUS

## 2011-09-23 MED ORDER — HYDROMORPHONE HCL PF 1 MG/ML IJ SOLN
0.5000 mg | INTRAMUSCULAR | Status: DC | PRN
  Administered 2011-09-23 – 2011-09-24 (×6): 0.5 mg via INTRAVENOUS
  Filled 2011-09-23 (×7): qty 1

## 2011-09-23 NOTE — Interval H&P Note (Signed)
History and Physical Interval Note:  09/23/2011 11:42 AM  Kathryn [name]  has presented today for surgery, with the diagnosis of GI bleed  The various methods of treatment have been discussed with the patient and family. After consideration of risks, benefits and other options for treatment, the patient has consented to  Procedure(s) (LRB): ESOPHAGOGASTRODUODENOSCOPY (EGD) (N/A) as a surgical intervention .  The patients' history has been reviewed, patient examined, no change in status, stable for surgery.  I have reviewed the patients' chart and labs.  Questions were answered to the patient's satisfaction.     Elbridge Magowan C.

## 2011-09-23 NOTE — Progress Notes (Signed)
INITIAL ADULT NUTRITION ASSESSMENT Date: 09/23/2011   Time: 1:13 PM  Reason for Assessment: Nutrition Risk Report  ASSESSMENT: Female 61 y.o.  Dx: ascites  Hx:  Past Medical History  Diagnosis Date  . Hypertension   . Osteoporosis   . Alcoholic liver disease   . High cholesterol   . Peripheral vascular disease   . Shortness of breath 09/22/11    "because of all the fluid that's built up in my stomach"  . Ascites 09/22/11    "first time for me"  . Anemia   . History of blood transfusion 07/2011    "when I had elbow surgery"  . Arthritis     "all over"  . Dysrhythmia     "irregular"   Past Surgical History  Procedure Date  . Fracture surgery   . Amputation     two toes on left foot  . Bunionectomy     both feet  . Arthroscopic repair acl     right knee  . Joint replacement     right hip   . Orif humerus fracture 07/26/2011    Procedure: OPEN REDUCTION INTERNAL FIXATION (ORIF) DISTAL HUMERUS FRACTURE;  Surgeon: Budd Palmer, MD;  Location: [facility] OR;  Service: Orthopedics;  Laterality: Right;  . Total hip arthroplasty ~ 2003    right  . Tubal ligation 1980  . Paracentesis 09/22/11    2.2L   Related Meds:     . ciprofloxacin  400 mg Intravenous Q8H  . iohexol  20 mL Oral Q1 Hr x 2  . nystatin  5 mL Oral QID  . pantoprazole (PROTONIX) IV  40 mg Intravenous Q12H  . pneumococcal 23 valent vaccine  0.5 mL Intramuscular Tomorrow-1000  . potassium chloride  10 mEq Intravenous Once  . potassium chloride  10 mEq Intravenous Q1 Hr x 6  . potassium chloride  40 mEq Oral Once  . DISCONTD: sodium chloride   Intravenous Once  . DISCONTD: metoprolol succinate  25 mg Oral q morning - 10a  . DISCONTD: pantoprazole  40 mg Oral Q1200   Ht: 5' (152.4 cm)  Wt: 104 lb 15 oz (47.6 kg)  Ideal Wt: 45.5 kg % Ideal Wt: 105%  Wt Readings from Last 15 Encounters:  09/22/11 104 lb 15 oz (47.6 kg)  09/22/11 104 lb 15 oz (47.6 kg)  08/05/11 110 lb (49.896 kg)  07/25/11 108 lb (48.988 kg)   07/25/11 108 lb (48.988 kg)  09/16/09 112 lb (50.803 kg)  08/17/09 113 lb 6.4 oz (51.438 kg)  06/12/08 120 lb 6.4 oz (54.613 kg)  05/20/08 117 lb 8 oz (53.298 kg)  05/06/08 120 lb 12.8 oz (54.795 kg)  04/03/08 119 lb (53.978 kg)  03/17/08 121 lb 6.4 oz (55.067 kg)  03/10/08 117 lb 3.2 oz (53.162 kg)  02/21/07 114 lb (51.71 kg)  02/07/07 112 lb 9.6 oz (51.075 kg)  Usual Wt: 105 - 110 lb per pt % Usual Wt: 96%  Body mass index is 20.49 kg/(m^2). Weight is WNL  Food/Nutrition Related Hx: pt reports poor intake x 3 weeks  Labs:  CMP     Component Value Date/Time   NA 131* 09/23/2011 0520   K 2.6* 09/23/2011 0520   CL 88* 09/23/2011 0520   CO2 35* 09/23/2011 0520   GLUCOSE 86 09/23/2011 0520   GLUCOSE 97 11/30/2009   BUN 9 09/23/2011 0520   CREATININE 0.64 09/23/2011 0520   CALCIUM 7.5* 09/23/2011 0520   CALCIUM 8.4 07/26/2011 1329  PROT 5.0* 09/23/2011 0520   ALBUMIN 1.6* 09/23/2011 0520   AST 25 09/23/2011 0520   ALT <5 09/23/2011 0520   ALKPHOS 247* 09/23/2011 0520   BILITOT 1.9* 09/23/2011 0520   GFRNONAA >90 09/23/2011 0520   GFRAA >90 09/23/2011 0520    Intake/Output Summary (Last 24 hours) at 09/23/11 1313 Last data filed at 09/23/11 1237  Gross per 24 hour  Intake    380 ml  Output      0 ml  Net    380 ml   Diet Order: Clear Liquid  Supplements/Tube Feeding: none  IVF:     sodium chloride Last Rate: 75 mL/hr at 09/23/11 0057    Estimated Nutritional Needs:   Kcal: 1450 - 1650 kcal Protein: at least 61 grams Fluid:  1 - 1.2 liters daily  Pt with alcoholic liver disease. Admitted for increasing abdominal pain and distention x 3 weeks. Intermittent n/v x several weeks. Unable to keep down foods, per pt. States that the last time she was able to consume more than a few bites of solids was at least 3 weeks ago.  Paracentesis yesterday. Endoscopy today revealed moderate erosive esophagitis.  Pt is currently on a clear liquid diet. States that she is tolerating okay. Agreeable to  trying Resource Breeze for additional protein and kcal.  Pt reports that her usual weight is 105 - 110 lb.  Pt meets criteria for moderate malnutrition in the context of acute illness 2/2 <75% of estimated energy requirement for at least 7 days, mild body fat and muscle mass loss in extremeties per pt report. Also, suspect some level of wt loss, however unable to determine weight loss now 2/2 ascites and abdominal fluid. Current albumin 1.6.  NUTRITION DIAGNOSIS: -Inadequate oral intake (NI-2.1).  Status: Ongoing  RELATED TO: GI distress  AS EVIDENCE BY: pt report of poor intake x 3 weeks  MONITORING/EVALUATION(Goals): Goal: Advance to low sodium diet as tolerated. Intake to meet at least 90% of estimated needs. Monitor: Weights, labs, PO intake, I/O's, diet advancement  EDUCATION NEEDS: -Education not appropriate at this time  INTERVENTION: 1. Resource Breeze PO BID for additional protein and kcal 2. RD to continue to follow   Dietitian #: [phone] October 08, 2644  DOCUMENTATION CODES Per approved criteria  -Non-severe (moderate) malnutrition in the context of acute illness or injury    Adair Laundry 09/23/2011, 1:13 PM

## 2011-09-23 NOTE — Op Note (Signed)
[facility] [address] [place], [place]  [address]  ENDOSCOPY PROCEDURE REPORT  PATIENT:  Kathryn, [name]  MR#:  [id number] BIRTHDATE:  Jul 09, 1950, 60 yrs. old  GENDER:  female  ENDOSCOPIST:  Charlott Rakes, MD Referred by:  PROCEDURE DATE:  09/23/2011 PROCEDURE:  EGD w/brushings ASA CLASS:  Class III INDICATIONS:  melena, hematemesis  MEDICATIONS:   Fentanyl 25 mcg IV, Versed 2 mg IV, Cetacaine spray x 2  TOPICAL ANESTHETIC:  DESCRIPTION OF PROCEDURE:   After the risks benefits and alternatives of the procedure were thoroughly explained, informed consent was obtained.  The Pentax Gastroscope [id number] endoscope was introduced through the mouth and advanced to the second portion of the duodenum, without limitations.  The instrument was slowly withdrawn as the mucosa was fully examined. <<PROCEDUREIMAGES>>  FINDINGS:  The endoscope was inserted into the oropharynx and esophagus was intubated.  There were linear ulcerations noted in the mid esophagus extending down to the gastroesophageal junction. These ulcerations were circumferential in the midesophagus and became confluent at the gastroesophageal junction. The esophagitis extended from 24 cm from the incisors to 34 cm from the incisors. The gastroesophageal junction was noted to be 34 cm from the incisors.  Endoscope was advanced into the stomach which revealed normal-appearing body and distal stomach retroflexion was done which revealed mild portal hypertensive gastropathy. A medium-sized hiatal hernia was noted. The endoscope was advanced to the duodenal bulb and second portion of duodenum which were unremarkable.  Esophageal brushing was taken of the inflamed mucosa and the linear white streaks appeared to be plaque-like from Candida.  COMPLICATIONS:  None  ENDOSCOPIC IMPRESSION:  Moderate erosive esophagitis - ?Candida -s/p brushing for cytology  RECOMMENDATIONS:  1. Start Nystatin 2.  Continue IV PPI Q 12 hours 3. Advance diet as tolerated to low sodium  REPEAT EXAM:  N/A  ______________________________ Charlott Rakes, MD  CC:  n. eSIGNEDCharlott Rakes at 09/23/2011 12:12 PM  [name], [phone]

## 2011-09-23 NOTE — Brief Op Note (Signed)
Erosive esophagitis likely due to Candida but reflux still possible. Will start Nystatin and continue IV PPI Q 12hours. Clears today and advance tomorrow as tolerated to low sodium diet. Esophageal brushing pending. Dr. Dulce Sellar available to see this weekend.

## 2011-09-23 NOTE — Progress Notes (Signed)
Received a phone call from the lab with critical potassium of 2.6.MD. Has been notified,orders were received,gastroenterologist has been page as well.keep monitoring pt. closely

## 2011-09-23 NOTE — Progress Notes (Signed)
This visit was in response to a spiritual care consult.  Pt, Kathryn [name],  was thankful that I followed up after her procedure.  I offered emotional and spiritual support.  Kimbly is grieving her loss of her ability to eat and her lack of knowledge of what is wrong.  She asked me to pray for her, and also indicated that a follow-up would be nice.  I plan to follow.  Please page me if I am needed or requested. Dellie Catholic  [phone] oncall pager   [phone]  Personal pager  09/23/11 1549  Clinical Encounter Type  Visited With Patient  Visit Type Follow-up  Referral From Nurse  Spiritual Encounters  Spiritual Needs Emotional  Stress Factors  Patient Stress Factors Loss of control;Health changes  Family Stress Factors Not reviewed

## 2011-09-24 DIAGNOSIS — K921 Melena: Secondary | ICD-10-CM

## 2011-09-24 DIAGNOSIS — E876 Hypokalemia: Secondary | ICD-10-CM

## 2011-09-24 DIAGNOSIS — R188 Other ascites: Secondary | ICD-10-CM

## 2011-09-24 DIAGNOSIS — K7030 Alcoholic cirrhosis of liver without ascites: Secondary | ICD-10-CM

## 2011-09-24 LAB — CBC
HCT: 30.1 % — ABNORMAL LOW (ref 36.0–46.0)
Hemoglobin: 10.2 g/dL — ABNORMAL LOW (ref 12.0–15.0)
MCH: 34.3 pg — ABNORMAL HIGH (ref 26.0–34.0)
MCV: 101.3 fL — ABNORMAL HIGH (ref 78.0–100.0)
RBC: 2.97 MIL/uL — ABNORMAL LOW (ref 3.87–5.11)

## 2011-09-24 LAB — BASIC METABOLIC PANEL
BUN: 5 mg/dL — ABNORMAL LOW (ref 6–23)
CO2: 31 mEq/L (ref 19–32)
Calcium: 7.4 mg/dL — ABNORMAL LOW (ref 8.4–10.5)
Glucose, Bld: 94 mg/dL (ref 70–99)
Sodium: 125 mEq/L — ABNORMAL LOW (ref 135–145)

## 2011-09-24 MED ORDER — PANTOPRAZOLE SODIUM 40 MG PO TBEC
40.0000 mg | DELAYED_RELEASE_TABLET | Freq: Two times a day (BID) | ORAL | Status: DC
  Administered 2011-09-24 – 2011-09-27 (×6): 40 mg via ORAL
  Filled 2011-09-24 (×6): qty 1

## 2011-09-24 MED ORDER — PNEUMOCOCCAL VAC POLYVALENT 25 MCG/0.5ML IJ INJ
0.5000 mL | INJECTION | INTRAMUSCULAR | Status: AC
  Administered 2011-09-24: 0.5 mL via INTRAMUSCULAR
  Filled 2011-09-24: qty 0.5

## 2011-09-24 NOTE — Progress Notes (Signed)
Subjective: Feels a little better, going for EGD this afternoon  Objective: Vital signs in last 24 hours: Temp:  [98 F (36.7 C)-99.1 F (37.3 C)] 98 F (36.7 C) (06/08 0549) Pulse Rate:  [90-107] 102  (06/08 0549) Resp:  [12-87] 18  (06/08 0549) BP: (78-113)/(33-74) 94/67 mmHg (06/08 0549) SpO2:  [88 %-99 %] 95 % (06/08 0549) Weight:  [47.6 kg (104 lb 15 oz)] 47.6 kg (104 lb 15 oz) (06/07 2026) Weight change: 0 kg (0 lb) Last BM Date: 09/22/11  Intake/Output from previous day: 06/07 0701 - 06/08 0700 In: 1320 [P.O.:120; I.V.:200; IV Piggyback:1000] Out: -      Physical Exam: Gen: AAOx3,  HEENT: PERRLA, scleral icterus, mild pallor  CVS: S1S2/RRR, no m/r/g  Lungs: CTAB  Abd: firm, less distended, mild diffuse tenderness, no rebound, fluid thrill, positive bowel sounds  Ext: positive edema, c/c  Neuro: moves all ext, no localizing signs     Lab Results: Basic Metabolic Panel:  Basename 09/23/11 0520 09/22/11 0859  NA 131* 133*  K 2.6* 2.2*  CL 88* 82*  CO2 35* 41*  GLUCOSE 86 108*  BUN 9 11  CREATININE 0.64 0.68  CALCIUM 7.5* 8.8  MG -- --  PHOS -- --   Liver Function Tests:  Basename 09/23/11 0520 09/22/11 0859  AST 25 34  ALT <5 5  ALKPHOS 247* 343*  BILITOT 1.9* 2.8*  PROT 5.0* 6.1  ALBUMIN 1.6* 2.1*   No results found for this basename: LIPASE:2,AMYLASE:2 in the last 72 hours No results found for this basename: AMMONIA:2 in the last 72 hours CBC:  Basename 09/23/11 0520 09/22/11 0859  WBC 9.1 11.9*  NEUTROABS -- 8.6*  HGB 9.2* 10.6*  HCT 27.3* 30.8*  MCV 101.5* 102.7*  PLT 151 177   Cardiac Enzymes: No results found for this basename: CKTOTAL:3,CKMB:3,CKMBINDEX:3,TROPONINI:3 in the last 72 hours BNP: No results found for this basename: PROBNP:3 in the last 72 hours D-Dimer: No results found for this basename: DDIMER:2 in the last 72 hours CBG: No results found for this basename: GLUCAP:6 in the last 72 hours Hemoglobin A1C: No  results found for this basename: HGBA1C in the last 72 hours Fasting Lipid Panel: No results found for this basename: CHOL,HDL,LDLCALC,TRIG,CHOLHDL,LDLDIRECT in the last 72 hours Thyroid Function Tests: No results found for this basename: TSH,T4TOTAL,FREET4,T3FREE,THYROIDAB in the last 72 hours Anemia Panel: No results found for this basename: VITAMINB12,FOLATE,FERRITIN,TIBC,IRON,RETICCTPCT in the last 72 hours Coagulation:  Basename 09/22/11 0859  LABPROT 16.8*  INR 1.34   Urine Drug Screen: Drugs of Abuse  No results found for this basename: labopia, cocainscrnur, labbenz, amphetmu, thcu, labbarb    Alcohol Level: No results found for this basename: ETH:2 in the last 72 hours Urinalysis:  Basename 09/22/11 0946 09/22/11 0825  COLORURINE ORANGE* ORANGE*  LABSPEC 1.022 1.023  PHURINE 7.5 6.5  GLUCOSEU NEGATIVE NEGATIVE  HGBUR NEGATIVE NEGATIVE  BILIRUBINUR MODERATE* MODERATE*  KETONESUR 15* 15*  PROTEINUR NEGATIVE NEGATIVE  UROBILINOGEN 2.0* 4.0*  NITRITE POSITIVE* POSITIVE*  LEUKOCYTESUR SMALL* MODERATE*    Recent Results (from the past 240 hour(s))  URINE CULTURE     Status: Normal   Collection Time   09/22/11  9:46 AM      Component Value Range Status Comment   Specimen Description URINE, CATHETERIZED   Final    Special Requests NONE   Final    Culture  Setup Time [id number]   Final    Colony Count NO GROWTH   Final  Culture NO GROWTH   Final    Report Status 09/23/2011 FINAL   Final   BODY FLUID CULTURE     Status: Normal (Preliminary result)   Collection Time   09/22/11  3:18 PM      Component Value Range Status Comment   Specimen Description PERITONEAL FLUID   Final    Special Requests Normal   Final    Gram Stain     Final    Value: FEW WBC PRESENT,BOTH PMN AND MONONUCLEAR     NO ORGANISMS SEEN   Culture NO GROWTH 1 DAY   Final    Report Status PENDING   Incomplete     Studies/Results: Ct Abdomen Pelvis W Contrast  09/22/2011  *RADIOLOGY REPORT*   Clinical Data: Abdominal pain.  Elevated liver function tests. Abdominal swelling.  History of pancreatitis.  Ethanol dependence.  CT ABDOMEN AND PELVIS WITH CONTRAST  Technique:  Multidetector CT imaging of the abdomen and pelvis was performed following the standard protocol during bolus administration of intravenous contrast.  Contrast: 75mL OMNIPAQUE IOHEXOL 300 MG/ML  SOLN  Comparison: Abdominal ultrasound of 07/27/2011.  Abdominal MRI 10/02/2009.  CT of 04/27/2008.  Findings: Right base atelectasis.  Left base collapse / consolidative change.  Mild cardiomegaly with small bilateral pleural effusions.  Small hiatal hernia with distal esophageal wall thickening; image 88.  Moderate volume abdominal pelvic ascites.  Moderate progressive Cirrhosis. Right hepatic atrophy and left lobe enlargement. Geographic hypoattenuation involving nearly the entire left lobe of the liver.  This is similar configuration to 09/22/2009.  The portal veins and hepatic veins are patent.  No well-defined mass is seen.  Normal spleen, distal stomach.  Mild pancreatic atrophy.  Mucosal hyperenhancement involving the gallbladder is nonspecific.  No biliary ductal dilatation.  Normal adrenal glands.  Punctate left renal calculus.  Normal right kidney.  Porta hepatis nodes are enlarged and slightly increased since the prior.  Example node measures 1.1 cm on image 27 of series 2. Likely reactive.  No retrocrural adenopathy.  A recanalized paraumbilical vein.  Beam hardening artifact degrades evaluation the pelvis (secondary to right hip arthroplasty). Normal colon, appendix, and terminal ileum.  Small bowel normal in caliber.  Mild interloop mesenteric fluid.  No pelvic adenopathy.  Normal urinary bladder.  Anterior uterine fibroid on image 72.  No adnexal mass.  Large amount of free pelvic fluid.  Right hip arthroplasty.  Avascular necrosis of the left femoral head on image 80.  Mild osteopenia.  Remote left rib fractures.  IMPRESSION:  1.   Moderate cirrhosis and enlargement of the left lobe. Geographic hypoattenuation throughout the majority of the left lobe is favored to be related to heterogeneous steatosis and is grossly similar to 09/22/2009.  Correlation with AFP suggested. Consideration of outpatient pre post contrast abdominal MRI, to exclude otherwise underlying hepatocellular carcinoma recommended. 2.  Ascites with recanalized paraumbilical vein, suggesting portal venous hypertension. 3.  Small hiatal hernia with right base atelectasis and left base collapse / consolidative change.  This could represent atelectasis or infection. 4.  Moderate hiatal hernia with suspicion of distal esophagitis. 5.  Uterine fibroid. 6.  Right hip arthroplasty with left femoral head avascular necrosis. 7.  Non specific mucosal hyperenhancement within the gallbladder.  Original Report Authenticated By: Consuello Bossier, M.D.   US Paracentesis  09/23/2011  *RADIOLOGY REPORT*  Clinical Data: Abdominal ascites  ULTRASOUND GUIDED PARACENTESIS  Comparison:  None  An ultrasound guided paracentesis was thoroughly discussed with the patient and questions  answered.  The benefits, risks, alternatives and complications were also discussed.  The patient understands and wishes to proceed with the procedure.  Written consent was obtained.  Ultrasound was performed to localize and mark an adequate pocket of fluid in the right lower quadrant of the abdomen.  The area was then prepped and draped in the normal sterile fashion.  1% Lidocaine was used for local anesthesia.  Under ultrasound guidance a 19 gauge Yueh catheter was introduced.  Paracentesis was performed.  The catheter was removed and a dressing applied.  Complications:  None  Findings:  A total of approximately 2.2 liters of yellow fluid was removed.  A fluid sample was sent for laboratory analysis.  IMPRESSION: Successful ultrasound guided paracentesis yielding 2.2 liters of ascites.  Read by: Ralene Muskrat, P.A.-C   Original Report Authenticated By: Reola Calkins, M.D.    Medications: Scheduled Meds:   . ciprofloxacin  400 mg Intravenous Q8H  . feeding supplement  1 Container Oral BID BM  . nystatin  5 mL Oral QID  . pantoprazole (PROTONIX) IV  40 mg Intravenous Q12H  . pneumococcal 23 valent vaccine  0.5 mL Intramuscular Tomorrow-1000  . potassium chloride  10 mEq Intravenous Q1 Hr x 6  . DISCONTD: sodium chloride   Intravenous Once   Continuous Infusions:   . sodium chloride Stopped (09/23/11 1520)   PRN Meds:.HYDROcodone-acetaminophen, HYDROmorphone (DILAUDID) injection, ondansetron (ZOFRAN) IV, ondansetron, DISCONTD: butamben-tetracaine-benzocaine, DISCONTD: fentaNYL, DISCONTD: midazolam  Assessment/Plan: 1. Decompensated cirrhosis with Ascites  Recent quit ETOH, 2 months ago  Hepatitis panel negative in April  CT with evidence of portal hypertension  S/p paracentesis yesterday, no e/o SBP could DC Antibiotics Check AFP  Appreciate Dr.Schooler's input 2. H/o melena: Type and screen  Continue PPI, EGD today 2. Alcoholic liver disease  3. Hypokalemia-replace, monitor on Tele today  4. Hypercarbia: suspect contraction alkalosis from third spacing  5. DVT prophylaxis: SCDS   LOS: 2 days   [facility] Pager: [phone] 09/24/2011, 8:14 AM

## 2011-09-24 NOTE — Progress Notes (Signed)
Subjective: Upset about abd distension/flid build up again  Objective: Vital signs in last 24 hours: Temp:  [98 F (36.7 C)-99.1 F (37.3 C)] 98 F (36.7 C) (06/08 0549) Pulse Rate:  [90-107] 102  (06/08 0549) Resp:  [12-87] 18  (06/08 0549) BP: (80-113)/(33-74) 94/67 mmHg (06/08 0549) SpO2:  [88 %-99 %] 95 % (06/08 0549) Weight:  [47.6 kg (104 lb 15 oz)] 47.6 kg (104 lb 15 oz) (06/07 2026) Weight change: 0 kg (0 lb) Last BM Date: 09/22/11  Intake/Output from previous day: 06/07 0701 - 06/08 0700 In: 1320 [P.O.:120; I.V.:200; IV Piggyback:1000] Out: -      Physical Exam: Gen: AAOx3, frail HEENT: PERRLA, scleral icterus, mild pallor  CVS: S1S2/RRR, no m/r/g  Lungs: CTAB  Abd: firm, less distended, non tender, no rebound, fluid thrill, positive bowel sounds  Ext: no  edema, c/c  Neuro: moves all ext, no localizing signs   Lab Results: Basic Metabolic Panel:  Basename 09/23/11 0520 09/22/11 0859  NA 131* 133*  K 2.6* 2.2*  CL 88* 82*  CO2 35* 41*  GLUCOSE 86 108*  BUN 9 11  CREATININE 0.64 0.68  CALCIUM 7.5* 8.8  MG -- --  PHOS -- --   Liver Function Tests:  Basename 09/23/11 0520 09/22/11 0859  AST 25 34  ALT <5 5  ALKPHOS 247* 343*  BILITOT 1.9* 2.8*  PROT 5.0* 6.1  ALBUMIN 1.6* 2.1*   No results found for this basename: LIPASE:2,AMYLASE:2 in the last 72 hours No results found for this basename: AMMONIA:2 in the last 72 hours CBC:  Basename 09/23/11 0520 09/22/11 0859  WBC 9.1 11.9*  NEUTROABS -- 8.6*  HGB 9.2* 10.6*  HCT 27.3* 30.8*  MCV 101.5* 102.7*  PLT 151 177   Cardiac Enzymes: No results found for this basename: CKTOTAL:3,CKMB:3,CKMBINDEX:3,TROPONINI:3 in the last 72 hours BNP: No results found for this basename: PROBNP:3 in the last 72 hours D-Dimer: No results found for this basename: DDIMER:2 in the last 72 hours CBG: No results found for this basename: GLUCAP:6 in the last 72 hours Hemoglobin A1C: No results found for this  basename: HGBA1C in the last 72 hours Fasting Lipid Panel: No results found for this basename: CHOL,HDL,LDLCALC,TRIG,CHOLHDL,LDLDIRECT in the last 72 hours Thyroid Function Tests: No results found for this basename: TSH,T4TOTAL,FREET4,T3FREE,THYROIDAB in the last 72 hours Anemia Panel: No results found for this basename: VITAMINB12,FOLATE,FERRITIN,TIBC,IRON,RETICCTPCT in the last 72 hours Coagulation:  Basename 09/22/11 0859  LABPROT 16.8*  INR 1.34   Urine Drug Screen: Drugs of Abuse  No results found for this basename: labopia,  cocainscrnur,  labbenz,  amphetmu,  thcu,  labbarb    Alcohol Level: No results found for this basename: ETH:2 in the last 72 hours Urinalysis:  Basename 09/22/11 0946 09/22/11 0825  COLORURINE ORANGE* ORANGE*  LABSPEC 1.022 1.023  PHURINE 7.5 6.5  GLUCOSEU NEGATIVE NEGATIVE  HGBUR NEGATIVE NEGATIVE  BILIRUBINUR MODERATE* MODERATE*  KETONESUR 15* 15*  PROTEINUR NEGATIVE NEGATIVE  UROBILINOGEN 2.0* 4.0*  NITRITE POSITIVE* POSITIVE*  LEUKOCYTESUR SMALL* MODERATE*    Recent Results (from the past 240 hour(s))  URINE CULTURE     Status: Normal   Collection Time   09/22/11  9:46 AM      Component Value Range Status Comment   Specimen Description URINE, CATHETERIZED   Final    Special Requests NONE   Final    Culture  Setup Time [id number]   Final    Colony Count NO GROWTH   Final  Culture NO GROWTH   Final    Report Status 09/23/2011 FINAL   Final   BODY FLUID CULTURE     Status: Normal (Preliminary result)   Collection Time   09/22/11  3:18 PM      Component Value Range Status Comment   Specimen Description PERITONEAL FLUID   Final    Special Requests Normal   Final    Gram Stain     Final    Value: FEW WBC PRESENT,BOTH PMN AND MONONUCLEAR     NO ORGANISMS SEEN   Culture NO GROWTH 1 DAY   Final    Report Status PENDING   Incomplete     Studies/Results: Ct Abdomen Pelvis W Contrast  09/22/2011  *RADIOLOGY REPORT*  Clinical Data:  Abdominal pain.  Elevated liver function tests. Abdominal swelling.  History of pancreatitis.  Ethanol dependence.  CT ABDOMEN AND PELVIS WITH CONTRAST  Technique:  Multidetector CT imaging of the abdomen and pelvis was performed following the standard protocol during bolus administration of intravenous contrast.  Contrast: 75mL OMNIPAQUE IOHEXOL 300 MG/ML  SOLN  Comparison: Abdominal ultrasound of 07/27/2011.  Abdominal MRI 10/02/2009.  CT of 04/27/2008.  Findings: Right base atelectasis.  Left base collapse / consolidative change.  Mild cardiomegaly with small bilateral pleural effusions.  Small hiatal hernia with distal esophageal wall thickening; image 88.  Moderate volume abdominal pelvic ascites.  Moderate progressive Cirrhosis. Right hepatic atrophy and left lobe enlargement. Geographic hypoattenuation involving nearly the entire left lobe of the liver.  This is similar configuration to 09/22/2009.  The portal veins and hepatic veins are patent.  No well-defined mass is seen.  Normal spleen, distal stomach.  Mild pancreatic atrophy.  Mucosal hyperenhancement involving the gallbladder is nonspecific.  No biliary ductal dilatation.  Normal adrenal glands.  Punctate left renal calculus.  Normal right kidney.  Porta hepatis nodes are enlarged and slightly increased since the prior.  Example node measures 1.1 cm on image 27 of series 2. Likely reactive.  No retrocrural adenopathy.  A recanalized paraumbilical vein.  Beam hardening artifact degrades evaluation the pelvis (secondary to right hip arthroplasty). Normal colon, appendix, and terminal ileum.  Small bowel normal in caliber.  Mild interloop mesenteric fluid.  No pelvic adenopathy.  Normal urinary bladder.  Anterior uterine fibroid on image 72.  No adnexal mass.  Large amount of free pelvic fluid.  Right hip arthroplasty.  Avascular necrosis of the left femoral head on image 80.  Mild osteopenia.  Remote left rib fractures.  IMPRESSION:  1.  Moderate  cirrhosis and enlargement of the left lobe. Geographic hypoattenuation throughout the majority of the left lobe is favored to be related to heterogeneous steatosis and is grossly similar to 09/22/2009.  Correlation with AFP suggested. Consideration of outpatient pre post contrast abdominal MRI, to exclude otherwise underlying hepatocellular carcinoma recommended. 2.  Ascites with recanalized paraumbilical vein, suggesting portal venous hypertension. 3.  Small hiatal hernia with right base atelectasis and left base collapse / consolidative change.  This could represent atelectasis or infection. 4.  Moderate hiatal hernia with suspicion of distal esophagitis. 5.  Uterine fibroid. 6.  Right hip arthroplasty with left femoral head avascular necrosis. 7.  Non specific mucosal hyperenhancement within the gallbladder.  Original Report Authenticated By: Consuello Bossier, M.D.   US Paracentesis  09/23/2011  *RADIOLOGY REPORT*  Clinical Data: Abdominal ascites  ULTRASOUND GUIDED PARACENTESIS  Comparison:  None  An ultrasound guided paracentesis was thoroughly discussed with the patient and questions  answered.  The benefits, risks, alternatives and complications were also discussed.  The patient understands and wishes to proceed with the procedure.  Written consent was obtained.  Ultrasound was performed to localize and mark an adequate pocket of fluid in the right lower quadrant of the abdomen.  The area was then prepped and draped in the normal sterile fashion.  1% Lidocaine was used for local anesthesia.  Under ultrasound guidance a 19 gauge Yueh catheter was introduced.  Paracentesis was performed.  The catheter was removed and a dressing applied.  Complications:  None  Findings:  A total of approximately 2.2 liters of yellow fluid was removed.  A fluid sample was sent for laboratory analysis.  IMPRESSION: Successful ultrasound guided paracentesis yielding 2.2 liters of ascites.  Read by: Ralene Muskrat, P.A.-C  Original  Report Authenticated By: Reola Calkins, M.D.    Medications: Scheduled Meds:    . feeding supplement  1 Container Oral BID BM  . nystatin  5 mL Oral QID  . pantoprazole (PROTONIX) IV  40 mg Intravenous Q12H  . pneumococcal 23 valent vaccine  0.5 mL Intramuscular Tomorrow-1000  . potassium chloride  10 mEq Intravenous Q1 Hr x 6  . DISCONTD: sodium chloride   Intravenous Once  . DISCONTD: ciprofloxacin  400 mg Intravenous Q8H   Continuous Infusions:    . DISCONTD: sodium chloride Stopped (09/23/11 1520)   PRN Meds:.HYDROcodone-acetaminophen, HYDROmorphone (DILAUDID) injection, ondansetron (ZOFRAN) IV, ondansetron, DISCONTD: butamben-tetracaine-benzocaine, DISCONTD: fentaNYL, DISCONTD: midazolam  Assessment/Plan: 1. Decompensated cirrhosis with Ascites  Recently quit ETOH, 2 months ago  Hepatitis panel negative in April  CT with evidence of portal hypertension  S/p paracentesis 6/6 with 2.2L fluid removal, no e/o SBP, Cultures negative so far, will DC Antibiotics AFP -previous order got cancelled, will re-order Appreciate Dr.Schooler's input Unfortunately unable to add Diuretics given soft BP Await Gi input regarding managing recurrent ascites, if AFP negative, may need frequent therapeutic paracentesis vs TIPS, especially since unable to add diuretics yest 2. Erosive esophagitis s/p EGD yesterday, continue Nystatin, FU biopsy, change PPI to PO 2. Alcoholic liver disease -quit ETOH in April 3. Hypokalemia-labs pending this am, monitor on Tele today  4. Hypercarbia: suspect contraction alkalosis from third spacing  5. DVT prophylaxis: SCDS   LOS: 2 days   [facility] Pager: [phone] 09/24/2011, 8:59 AM

## 2011-09-24 NOTE — Progress Notes (Signed)
Subjective: No further bleeding. Is having escalating ascites.  Objective: Vital signs in last 24 hours: Temp:  [98 F (36.7 C)-99.1 F (37.3 C)] 98.4 F (36.9 C) (06/08 0900) Pulse Rate:  [97-107] 97  (06/08 0900) Resp:  [18] 18  (06/08 0900) BP: (85-94)/(59-68) 87/63 mmHg (06/08 0900) SpO2:  [94 %-95 %] 95 % (06/08 0900) Weight:  [47.6 kg (104 lb 15 oz)] 47.6 kg (104 lb 15 oz) (06/07 2026) Weight change: 0 kg (0 lb) Last BM Date: 09/23/11  PE: GEN:  Cachectic-appearing with muscular atrophy ABD:  Distended, shifting dullness, caput medusae  Lab Results: CMP     Component Value Date/Time   NA 125* 09/24/2011 0836   K 3.2* 09/24/2011 0836   CL 85* 09/24/2011 0836   CO2 31 09/24/2011 0836   GLUCOSE 94 09/24/2011 0836   GLUCOSE 97 11/30/2009   BUN 5* 09/24/2011 0836   CREATININE 0.68 09/24/2011 0836   CALCIUM 7.4* 09/24/2011 0836   CALCIUM 8.4 07/26/2011 1329   PROT 5.0* 09/23/2011 0520   ALBUMIN 1.6* 09/23/2011 0520   AST 25 09/23/2011 0520   ALT <5 09/23/2011 0520   ALKPHOS 247* 09/23/2011 0520   BILITOT 1.9* 09/23/2011 0520   GFRNONAA >90 09/24/2011 0836   GFRAA >90 09/24/2011 0836   CBC    Component Value Date/Time   WBC 9.1 09/24/2011 0836   RBC 2.97* 09/24/2011 0836   HGB 10.2* 09/24/2011 0836   HCT 30.1* 09/24/2011 0836   PLT 171 09/24/2011 0836   MCV 101.3* 09/24/2011 0836   MCH 34.3* 09/24/2011 0836   MCHC 33.9 09/24/2011 0836   RDW 14.6 09/24/2011 0836   LYMPHSABS 2.1 09/22/2011 0859   MONOABS 1.1* 09/22/2011 0859   EOSABS 0.0 09/22/2011 0859   BASOSABS 0.0 09/22/2011 0859   Assessment:  1.  Alcohol-mediated cirrhosis.  Mild portal gastropathy, but no esophageal or gastric varices seen. 2.  Candida > reflux esophagitis on EGD yesterday. 3.  Ascites.  Plan:  1.  Agree with low sodium diet. 2.  Try to cut back on normal saline IVF, as this is likely contributing to her ascites. 3.  Agree to hold off on diuretics at this time, especially in light of her hyponatremia. 4.  Large volume paracentesis  Monday.  Pending her clinical course, perhaps she could go home after her paracentesis with close outpatient follow-up with me in the next couple weeks. 5.  Case discussed with Dr. Jomarie Longs.  6.  Will follow.     Freddy Jaksch 09/24/2011, 1:32 PM

## 2011-09-25 DIAGNOSIS — E876 Hypokalemia: Secondary | ICD-10-CM

## 2011-09-25 DIAGNOSIS — K7030 Alcoholic cirrhosis of liver without ascites: Secondary | ICD-10-CM

## 2011-09-25 DIAGNOSIS — K921 Melena: Secondary | ICD-10-CM

## 2011-09-25 DIAGNOSIS — R188 Other ascites: Secondary | ICD-10-CM

## 2011-09-25 LAB — COMPREHENSIVE METABOLIC PANEL
BUN: 4 mg/dL — ABNORMAL LOW (ref 6–23)
CO2: 31 mEq/L (ref 19–32)
Chloride: 92 mEq/L — ABNORMAL LOW (ref 96–112)
Creatinine, Ser: 0.56 mg/dL (ref 0.50–1.10)
GFR calc non Af Amer: >90 mL/min (ref >90)
Total Bilirubin: 1.5 mg/dL — ABNORMAL HIGH (ref 0.3–1.2)

## 2011-09-25 LAB — CBC
HCT: 27.1 % — ABNORMAL LOW (ref 36.0–46.0)
MCH: 34.6 pg — ABNORMAL HIGH (ref 26.0–34.0)
MCV: 100.7 fL — ABNORMAL HIGH (ref 78.0–100.0)
RBC: 2.69 MIL/uL — ABNORMAL LOW (ref 3.87–5.11)
RDW: 14.6 % (ref 11.5–15.5)
WBC: 8 10*3/uL (ref 4.0–10.5)

## 2011-09-25 MED ORDER — POTASSIUM CHLORIDE CRYS ER 20 MEQ PO TBCR
60.0000 meq | EXTENDED_RELEASE_TABLET | Freq: Once | ORAL | Status: AC
  Administered 2011-09-25: 60 meq via ORAL
  Filled 2011-09-25: qty 3

## 2011-09-25 MED ORDER — ZOLPIDEM TARTRATE 5 MG PO TABS
5.0000 mg | ORAL_TABLET | Freq: Every evening | ORAL | Status: DC | PRN
  Administered 2011-09-25 – 2011-09-26 (×3): 5 mg via ORAL
  Filled 2011-09-25 (×3): qty 1

## 2011-09-25 MED ORDER — FUROSEMIDE 20 MG PO TABS
20.0000 mg | ORAL_TABLET | Freq: Every day | ORAL | Status: DC
  Administered 2011-09-25 – 2011-09-27 (×3): 20 mg via ORAL
  Filled 2011-09-25 (×3): qty 1

## 2011-09-25 MED ORDER — POTASSIUM CHLORIDE CRYS ER 20 MEQ PO TBCR
40.0000 meq | EXTENDED_RELEASE_TABLET | Freq: Two times a day (BID) | ORAL | Status: DC
  Administered 2011-09-25 (×2): 40 meq via ORAL
  Filled 2011-09-25 (×4): qty 2

## 2011-09-25 MED ORDER — HYDROCODONE-ACETAMINOPHEN 5-325 MG PO TABS
1.0000 | ORAL_TABLET | ORAL | Status: DC | PRN
  Administered 2011-09-25 – 2011-09-27 (×10): 1 via ORAL
  Filled 2011-09-25 (×10): qty 1

## 2011-09-25 MED ORDER — POTASSIUM CHLORIDE CRYS ER 20 MEQ PO TBCR: 40.0000 meq | EXTENDED_RELEASE_TABLET | Freq: Every day | ORAL | Status: DC

## 2011-09-25 NOTE — Progress Notes (Signed)
Subjective: abd distention, slight discomfort, Hit R elbow  Objective: Vital signs in last 24 hours: Temp:  [98.4 F (36.9 C)-99.1 F (37.3 C)] 99.1 F (37.3 C) (06/09 0940) Pulse Rate:  [99-127] 127  (06/09 0940) Resp:  [16-19] 18  (06/09 0940) BP: (88-105)/(59-76) 88/64 mmHg (06/09 0940) SpO2:  [94 %-100 %] 100 % (06/09 0940) Weight:  [50.168 kg (110 lb 9.6 oz)] 50.168 kg (110 lb 9.6 oz) (06/08 2048) Weight change: 2.568 kg (5 lb 10.6 oz) Last BM Date: 09/23/11  Intake/Output from previous day: 06/08 0701 - 06/09 0700 In: 480 [P.O.:480] Out: -      Physical Exam: Gen: AAOx3, frail HEENT: PERRLA, scleral icterus, mild pallor  CVS: S1S2/RRR, no m/r/g  Lungs: CTAB  Abd: firm, distended, non tender, no rebound, fluid thrill, positive bowel sounds  Ext: no  edema, c/c  Neuro: moves all ext, no localizing signs   Lab Results: Basic Metabolic Panel:  Basename 09/25/11 0605 09/24/11 0836  NA 131* 125*  K 2.7* 3.2*  CL 92* 85*  CO2 31 31  GLUCOSE 108* 94  BUN 4* 5*  CREATININE 0.56 0.68  CALCIUM 7.4* 7.4*  MG -- --  PHOS -- --   Liver Function Tests:  Basename 09/25/11 0605 09/23/11 0520  AST 38* 25  ALT <5 <5  ALKPHOS 283* 247*  BILITOT 1.5* 1.9*  PROT 5.3* 5.0*  ALBUMIN 1.8* 1.6*   No results found for this basename: LIPASE:2,AMYLASE:2 in the last 72 hours No results found for this basename: AMMONIA:2 in the last 72 hours CBC:  Basename 09/25/11 0605 09/24/11 0836  WBC 8.0 9.1  NEUTROABS -- --  HGB 9.3* 10.2*  HCT 27.1* 30.1*  MCV 100.7* 101.3*  PLT 156 171   Cardiac Enzymes: No results found for this basename: CKTOTAL:3,CKMB:3,CKMBINDEX:3,TROPONINI:3 in the last 72 hours BNP: No results found for this basename: PROBNP:3 in the last 72 hours D-Dimer: No results found for this basename: DDIMER:2 in the last 72 hours CBG: No results found for this basename: GLUCAP:6 in the last 72 hours Hemoglobin A1C: No results found for this basename: HGBA1C  in the last 72 hours Fasting Lipid Panel: No results found for this basename: CHOL,HDL,LDLCALC,TRIG,CHOLHDL,LDLDIRECT in the last 72 hours Thyroid Function Tests: No results found for this basename: TSH,T4TOTAL,FREET4,T3FREE,THYROIDAB in the last 72 hours Anemia Panel: No results found for this basename: VITAMINB12,FOLATE,FERRITIN,TIBC,IRON,RETICCTPCT in the last 72 hours Coagulation: No results found for this basename: LABPROT:2,INR:2 in the last 72 hours Urine Drug Screen: Drugs of Abuse  No results found for this basename: labopia,  cocainscrnur,  labbenz,  amphetmu,  thcu,  labbarb    Alcohol Level: No results found for this basename: ETH:2 in the last 72 hours Urinalysis: No results found for this basename: COLORURINE:2,APPERANCEUR:2,LABSPEC:2,PHURINE:2,GLUCOSEU:2,HGBUR:2,BILIRUBINUR:2,KETONESUR:2,PROTEINUR:2,UROBILINOGEN:2,NITRITE:2,LEUKOCYTESUR:2 in the last 72 hours  Recent Results (from the past 240 hour(s))  URINE CULTURE     Status: Normal   Collection Time   09/22/11  9:46 AM      Component Value Range Status Comment   Specimen Description URINE, CATHETERIZED   Final    Special Requests NONE   Final    Culture  Setup Time [id number]   Final    Colony Count NO GROWTH   Final    Culture NO GROWTH   Final    Report Status 09/23/2011 FINAL   Final   BODY FLUID CULTURE     Status: Normal (Preliminary result)   Collection Time   09/22/11  3:18 PM  Component Value Range Status Comment   Specimen Description PERITONEAL FLUID   Final    Special Requests Normal   Final    Gram Stain     Final    Value: FEW WBC PRESENT,BOTH PMN AND MONONUCLEAR     NO ORGANISMS SEEN   Culture NO GROWTH 2 DAYS   Final    Report Status PENDING   Incomplete     Studies/Results: No results found.  Medications: Scheduled Meds:    . feeding supplement  1 Container Oral BID BM  . furosemide  20 mg Oral Daily  . nystatin  5 mL Oral QID  . pantoprazole  40 mg Oral BID AC  . pneumococcal  23 valent vaccine  0.5 mL Intramuscular Tomorrow-1000  . potassium chloride  40 mEq Oral BID  . potassium chloride  60 mEq Oral Once  . DISCONTD: potassium chloride  40 mEq Oral Daily   Continuous Infusions:   PRN Meds:.HYDROcodone-acetaminophen, ondansetron (ZOFRAN) IV, ondansetron, zolpidem, DISCONTD: HYDROcodone-acetaminophen, DISCONTD:  HYDROmorphone (DILAUDID) injection  Assessment/Plan: 1. Decompensated cirrhosis with Ascites  Recently quit ETOH, 2 months ago  Hepatitis panel negative in April  CT with evidence of portal hypertension  S/p paracentesis 6/6 with 2.2L fluid removal, no e/o SBP, Cultures negative so far,  DCed Antibiotics AFP -normal, 3.0 Appreciate Dr.Outlaw's input, will repeat Paracentesis tomorrow Will start low dose lasix today, watch BP closely 2. Erosive esophagitis s/p EGD 6/7, continue Nystatin, FU biopsy, changed PPI to PO 3. Alcoholic liver disease -quit ETOH in April 4. Hypokalemia-replace BID, labs in am  5. Hyponatremia: due to volume removal, fluid shifts, improved, watch closely with starting diuretic 6. Hypercarbia: suspect contraction alkalosis from third spacing improved  7. DVT prophylaxis: SCDS Dispo: home tomorrow if stable, pending paracentesis    LOS: 3 days   [facility] Pager: [phone] 09/25/2011, 9:59 AM

## 2011-09-25 NOTE — Progress Notes (Signed)
CRITICAL VALUE ALERT  Critical value received:  K+ 2.7  Date of notification:  09/25/11  Time of notification:  07:35  Critical value read back: yes  Nurse who received alert:  Sharen Heck  MD notified (1st page):  Dr. Jomarie Longs  Time of first page:  07:36  MD notified (2nd page):  Time of second page:  Responding MD:  Dr. Jomarie Longs  Time MD responded:  07:44

## 2011-09-25 NOTE — Progress Notes (Signed)
Subjective: No blood in stool. Awaiting paracentesis tomorrow.  Objective: Vital signs in last 24 hours: Temp:  [98.4 F (36.9 C)-99.1 F (37.3 C)] 99.1 F (37.3 C) (06/09 0940) Pulse Rate:  [99-127] 127  (06/09 0940) Resp:  [16-19] 18  (06/09 0940) BP: (88-105)/(59-76) 88/64 mmHg (06/09 0940) SpO2:  [94 %-100 %] 100 % (06/09 0940) Weight:  [50.168 kg (110 lb 9.6 oz)] 50.168 kg (110 lb 9.6 oz) (06/08 2048) Weight change: 2.568 kg (5 lb 10.6 oz) Last BM Date: 09/23/11  PE: GEN:  Cachectic with muscular atrophy, is in no acute distress ABD:  Distended with ascites  Lab Results: CBC    Component Value Date/Time   WBC 8.0 09/25/2011 0605   RBC 2.69* 09/25/2011 0605   HGB 9.3* 09/25/2011 0605   HCT 27.1* 09/25/2011 0605   PLT 156 09/25/2011 0605   MCV 100.7* 09/25/2011 0605   MCH 34.6* 09/25/2011 0605   MCHC 34.3 09/25/2011 0605   RDW 14.6 09/25/2011 0605   LYMPHSABS 2.1 09/22/2011 0859   MONOABS 1.1* 09/22/2011 0859   EOSABS 0.0 09/22/2011 0859   BASOSABS 0.0 09/22/2011 0859   CMP     Component Value Date/Time   NA 131* 09/25/2011 0605   K 2.7* 09/25/2011 0605   CL 92* 09/25/2011 0605   CO2 31 09/25/2011 0605   GLUCOSE 108* 09/25/2011 0605   GLUCOSE 97 11/30/2009   BUN 4* 09/25/2011 0605   CREATININE 0.56 09/25/2011 0605   CALCIUM 7.4* 09/25/2011 0605   CALCIUM 8.4 07/26/2011 1329   PROT 5.3* 09/25/2011 0605   ALBUMIN 1.8* 09/25/2011 0605   AST 38* 09/25/2011 0605   ALT <5 09/25/2011 0605   ALKPHOS 283* 09/25/2011 0605   BILITOT 1.5* 09/25/2011 0605   GFRNONAA >90 09/25/2011 0605   GFRAA >90 09/25/2011 1610   Assessment:  1.  Alcohol-mediated cirrhosis. 2.  Ascites. 3.  GI bleed, resolved.  Plan:  1.  PPI x 6 weeks, fluconazole x 10-14 days. 2.  Correct electrolytes, as you are doing. 3.  Low sodium diet. 4.  Paracentesis tomorrow. 5.  Pending electrolyte repletion, will ultimately need to start low-dose diuretics. 6.  Will follow.   Kathryn [name] 09/25/2011, 11:21 AM

## 2011-09-26 ENCOUNTER — Inpatient Hospital Stay (HOSPITAL_COMMUNITY): Payer: Medicare Other

## 2011-09-26 ENCOUNTER — Encounter (HOSPITAL_COMMUNITY): Payer: Self-pay | Admitting: Gastroenterology

## 2011-09-26 DIAGNOSIS — K921 Melena: Secondary | ICD-10-CM

## 2011-09-26 DIAGNOSIS — K7030 Alcoholic cirrhosis of liver without ascites: Secondary | ICD-10-CM

## 2011-09-26 DIAGNOSIS — R188 Other ascites: Secondary | ICD-10-CM

## 2011-09-26 DIAGNOSIS — E876 Hypokalemia: Secondary | ICD-10-CM

## 2011-09-26 LAB — BODY FLUID CULTURE
Culture: NO GROWTH
Special Requests: NORMAL

## 2011-09-26 LAB — TYPE AND SCREEN

## 2011-09-26 MED ORDER — SPIRONOLACTONE 50 MG PO TABS
50.0000 mg | ORAL_TABLET | Freq: Every day | ORAL | Status: DC
  Administered 2011-09-26 – 2011-09-27 (×2): 50 mg via ORAL
  Filled 2011-09-26 (×2): qty 1

## 2011-09-26 MED ORDER — POTASSIUM CHLORIDE CRYS ER 20 MEQ PO TBCR
60.0000 meq | EXTENDED_RELEASE_TABLET | Freq: Two times a day (BID) | ORAL | Status: DC
  Administered 2011-09-26 (×2): 60 meq via ORAL
  Filled 2011-09-26: qty 3
  Filled 2011-09-26: qty 2
  Filled 2011-09-26: qty 3

## 2011-09-26 NOTE — Progress Notes (Signed)
Subjective: abd distention, slight discomfort, Hit R elbow  Objective: Vital signs in last 24 hours: Temp:  [98.4 F (36.9 C)-98.9 F (37.2 C)] 98.4 F (36.9 C) (06/10 2131) Pulse Rate:  [101-117] 108  (06/10 2131) Resp:  [16-20] 18  (06/10 2131) BP: (79-115)/(54-75) 99/67 mmHg (06/10 2131) SpO2:  [94 %-98 %] 96 % (06/10 2131) Weight:  [50.2 kg (110 lb 10.7 oz)] 50.2 kg (110 lb 10.7 oz) (06/10 2131) Weight change: -0.045 kg (-1.6 oz) Last BM Date: 09/23/11 (Pt denies feeling constipated)  Intake/Output from previous day: 06/09 0701 - 06/10 0700 In: 720 [P.O.:720] Out: 700 [Urine:700] Total I/O In: 240 [P.O.:240] Out: -    Physical Exam: Gen: AAOx3, frail HEENT: PERRLA, scleral icterus, mild pallor  CVS: S1S2/RRR, no m/r/g  Lungs: CTAB  Abd: firm, distended, non tender, no rebound, fluid thrill, positive bowel sounds  Ext: no  edema, c/c  Neuro: moves all ext, no localizing signs   Lab Results: Basic Metabolic Panel:  Basename 09/25/11 0605 09/24/11 0836  NA 131* 125*  K 2.7* 3.2*  CL 92* 85*  CO2 31 31  GLUCOSE 108* 94  BUN 4* 5*  CREATININE 0.56 0.68  CALCIUM 7.4* 7.4*  MG -- --  PHOS -- --   Liver Function Tests:  [facility] 09/25/11 0605  AST 38*  ALT <5  ALKPHOS 283*  BILITOT 1.5*  PROT 5.3*  ALBUMIN 1.8*   No results found for this basename: LIPASE:2,AMYLASE:2 in the last 72 hours No results found for this basename: AMMONIA:2 in the last 72 hours CBC:  Basename 09/25/11 0605 09/24/11 0836  WBC 8.0 9.1  NEUTROABS -- --  HGB 9.3* 10.2*  HCT 27.1* 30.1*  MCV 100.7* 101.3*  PLT 156 171   Cardiac Enzymes: No results found for this basename: CKTOTAL:3,CKMB:3,CKMBINDEX:3,TROPONINI:3 in the last 72 hours BNP: No results found for this basename: PROBNP:3 in the last 72 hours D-Dimer: No results found for this basename: DDIMER:2 in the last 72 hours CBG: No results found for this basename: GLUCAP:6 in the last 72 hours Hemoglobin A1C: No  results found for this basename: HGBA1C in the last 72 hours Fasting Lipid Panel: No results found for this basename: CHOL,HDL,LDLCALC,TRIG,CHOLHDL,LDLDIRECT in the last 72 hours Thyroid Function Tests: No results found for this basename: TSH,T4TOTAL,FREET4,T3FREE,THYROIDAB in the last 72 hours Anemia Panel: No results found for this basename: VITAMINB12,FOLATE,FERRITIN,TIBC,IRON,RETICCTPCT in the last 72 hours Coagulation: No results found for this basename: LABPROT:2,INR:2 in the last 72 hours Urine Drug Screen: Drugs of Abuse  No results found for this basename: labopia,  cocainscrnur,  labbenz,  amphetmu,  thcu,  labbarb    Alcohol Level: No results found for this basename: ETH:2 in the last 72 hours Urinalysis: No results found for this basename: COLORURINE:2,APPERANCEUR:2,LABSPEC:2,PHURINE:2,GLUCOSEU:2,HGBUR:2,BILIRUBINUR:2,KETONESUR:2,PROTEINUR:2,UROBILINOGEN:2,NITRITE:2,LEUKOCYTESUR:2 in the last 72 hours  Recent Results (from the past 240 hour(s))  URINE CULTURE     Status: Normal   Collection Time   09/22/11  9:46 AM      Component Value Range Status Comment   Specimen Description URINE, CATHETERIZED   Final    Special Requests NONE   Final    Culture  Setup Time [id number]   Final    Colony Count NO GROWTH   Final    Culture NO GROWTH   Final    Report Status 09/23/2011 FINAL   Final   BODY FLUID CULTURE     Status: Normal   Collection Time   09/22/11  3:18 PM  Component Value Range Status Comment   Specimen Description PERITONEAL FLUID   Final    Special Requests Normal   Final    Gram Stain     Final    Value: FEW WBC PRESENT,BOTH PMN AND MONONUCLEAR     NO ORGANISMS SEEN   Culture NO GROWTH 3 DAYS   Final    Report Status 09/26/2011 FINAL   Final     Studies/Results: US Paracentesis  09/26/2011  *RADIOLOGY REPORT*  Clinical Data: Alcoholic cirrhosis, recurrent abdominal ascites  ULTRASOUND GUIDED PARACENTESIS  Comparison:  Previous paracentesis  An  ultrasound guided paracentesis was thoroughly discussed with the patient and questions answered.  The benefits, risks, alternatives and complications were also discussed.  The patient understands and wishes to proceed with the procedure.  Written consent was obtained.  Ultrasound was performed to localize and mark an adequate pocket of fluid in the left lower quadrant of the abdomen.  The area was then prepped and draped in the normal sterile fashion.  1% Lidocaine was used for local anesthesia.  Under ultrasound guidance a 19 gauge Yueh catheter was introduced.  Paracentesis was performed.  The catheter was removed and a dressing applied.  Complications:  None  Findings:  A total of approximately 3.2 liters of clear yellow fluid was removed.  A fluid sample was not sent for laboratory analysis.  IMPRESSION: Successful ultrasound guided paracentesis yielding 3.2 liters of ascites.  Read by Brayton El PA-C  Original Report Authenticated By: Waynard Reeds, M.D.    Medications: Scheduled Meds:    . feeding supplement  1 Container Oral BID BM  . furosemide  20 mg Oral Daily  . nystatin  5 mL Oral QID  . pantoprazole  40 mg Oral BID AC  . potassium chloride  60 mEq Oral BID  . spironolactone  50 mg Oral Daily  . DISCONTD: potassium chloride  40 mEq Oral BID   Continuous Infusions:   PRN Meds:.HYDROcodone-acetaminophen, ondansetron (ZOFRAN) IV, ondansetron, zolpidem  Assessment/Plan: 1. Decompensated cirrhosis with Ascites  Recently quit ETOH, 2 months ago  Hepatitis panel negative in April  CT with evidence of portal hypertension  S/p paracentesis 6/6 with 2.2L fluid removal, no e/o SBP, Cultures negative so far,  DCed Antibiotics AFP -normal, 3.0 Appreciate Dr.Outlaw's input, for repeat Paracentesis today Continue lasix today, watch BP closely 2. Erosive esophagitis s/p EGD 6/7, continue Nystatin, FU biopsy, changed PPI to PO 3. Alcoholic liver disease -quit ETOH in April 4.  Hypokalemia-replace BID, labs in am  5. Hyponatremia: due to volume removal, fluid shifts, improved, watch closely with starting diuretic 6. Hypercarbia: suspect contraction alkalosis from third spacing improved  7. DVT prophylaxis: SCDS Dispo: home tomorrow    LOS: 4 days   [facility] Pager: [phone] 09/26/2011, 9:51 PM

## 2011-09-26 NOTE — Progress Notes (Signed)
Subjective: Paracentesis 3 Liters today. No further bleeding.  Objective: Vital signs in last 24 hours: Temp:  [98.4 F (36.9 C)-99 F (37.2 C)] 98.5 F (36.9 C) (06/10 1346) Pulse Rate:  [101-111] 101  (06/10 1346) Resp:  [16-18] 18  (06/10 1346) BP: (79-106)/(53-80) 93/64 mmHg (06/10 1346) SpO2:  [94 %-98 %] 98 % (06/10 1346) Weight:  [50.122 kg (110 lb 8 oz)] 50.122 kg (110 lb 8 oz) (06/09 2137) Weight change: -0.045 kg (-1.6 oz) Last BM Date: 09/23/11  PE: GEN:  Chronically cachectic-appearing, NAD MSK:  Diffuse muscular atrophy  Lab Results: CMP     Component Value Date/Time   NA 131* 09/25/2011 0605   K 2.7* 09/25/2011 0605   CL 92* 09/25/2011 0605   CO2 31 09/25/2011 0605   GLUCOSE 108* 09/25/2011 0605   GLUCOSE 97 11/30/2009   BUN 4* 09/25/2011 0605   CREATININE 0.56 09/25/2011 0605   CALCIUM 7.4* 09/25/2011 0605   CALCIUM 8.4 07/26/2011 1329   PROT 5.3* 09/25/2011 0605   ALBUMIN 1.8* 09/25/2011 0605   AST 38* 09/25/2011 0605   ALT <5 09/25/2011 0605   ALKPHOS 283* 09/25/2011 0605   BILITOT 1.5* 09/25/2011 0605   GFRNONAA >90 09/25/2011 0605   GFRAA >90 09/25/2011 0605   CBC    Component Value Date/Time   WBC 8.0 09/25/2011 0605   RBC 2.69* 09/25/2011 0605   HGB 9.3* 09/25/2011 0605   HCT 27.1* 09/25/2011 0605   PLT 156 09/25/2011 0605   MCV 100.7* 09/25/2011 0605   MCH 34.6* 09/25/2011 0605   MCHC 34.3 09/25/2011 0605   RDW 14.6 09/25/2011 0605   LYMPHSABS 2.1 09/22/2011 0859   MONOABS 1.1* 09/22/2011 0859   EOSABS 0.0 09/22/2011 0859   BASOSABS 0.0 09/22/2011 0859   Assessment:  1.  Alcohol-mediated cirrhosis. 2.  Esophagitis, reflux >> candidal. 3.  Ascites. 4.  Hypokalemia.  On no diuretics.  Plan:  1.  Protonix for 6 weeks. 2.  On low dose furosemide, but would not increase until hypokalemia improves. 3.  Start spironolactone 50 mg once-a-day, for synergistic diuresis (and might help hypokalemia as well). 4.  Low sodium diet. 5.  Hopefully home in the next day or two. 6.  Case discussed  with Dr. Jomarie Longs [facility]).   Freddy Jaksch 09/26/2011, 2:12 PM

## 2011-09-26 NOTE — Progress Notes (Signed)
Nutrition Follow-up/Consult  Advanced to low sodium diet on 6/8. Eating well, 60 - 100% of meals. RD consulted for low sodium education.  Diet Order:  Low Sodium Supplement: Resource Breeze PO BID  Meds: Scheduled Meds:   . feeding supplement  1 Container Oral BID BM  . furosemide  20 mg Oral Daily  . nystatin  5 mL Oral QID  . pantoprazole  40 mg Oral BID AC  . potassium chloride  40 mEq Oral BID   Continuous Infusions:  PRN Meds:.HYDROcodone-acetaminophen, ondansetron (ZOFRAN) IV, ondansetron, zolpidem  Labs:  CMP     Component Value Date/Time   NA 131* 09/25/2011 0605   K 2.7* 09/25/2011 0605   CL 92* 09/25/2011 0605   CO2 31 09/25/2011 0605   GLUCOSE 108* 09/25/2011 0605   GLUCOSE 97 11/30/2009   BUN 4* 09/25/2011 0605   CREATININE 0.56 09/25/2011 0605   CALCIUM 7.4* 09/25/2011 0605   CALCIUM 8.4 07/26/2011 1329   PROT 5.3* 09/25/2011 0605   ALBUMIN 1.8* 09/25/2011 0605   AST 38* 09/25/2011 0605   ALT <5 09/25/2011 0605   ALKPHOS 283* 09/25/2011 0605   BILITOT 1.5* 09/25/2011 0605   GFRNONAA >90 09/25/2011 0605   GFRAA >90 09/25/2011 0605     Intake/Output Summary (Last 24 hours) at 09/26/11 1010 Last data filed at 09/26/11 0750  Gross per 24 hour  Intake    720 ml  Output    950 ml  Net   -230 ml    Weight Status: 110 lb/50.1 kg - wt trending up 2/2 fluid status  Re-estimated needs:  1450 - 1650 kcal, at least 61 grams protein daily  Nutrition Dx:  Inadequate oral intake r/t GI distress AEB pt report of poor intake x 3 weeks. Ongoing.  Secondary Nutrition Dx: Food- and nutrition-related knowledge deficit r/t limited prior diet education AEB pt report.  Goal:   1. Advance to low sodium diet as tolerated. Intake to meet at least 90% of estimated needs. 2. Verbalize understanding of low sodium diet. Met.  Intervention:   1. Provided low sodium diet education and handout. Pt verbalized understanding. 2. Continue supplement to help maximize PO intake 3. Recommend checking phosphorus and  magnesium, replete prn. Pt is at risk for refeeding syndrome given recent prolonged suboptimal PO intake and dx of malnutrition. 4. RD to continue to follow nutrition care plan  Monitor:  Weights, labs, PO intake, I/O's, diet advancement  Adair Laundry Pager #:  [phone]

## 2011-09-26 NOTE — Procedures (Signed)
Successful US guided paracentesis from LL[facility].  Yielded 3.2L of clear yellow fluid.  No immediate complications.  Pt tolerated well. BP stable  Specimen was not sent for labs.  Brayton El PA-C 09/26/2011 11:52 AM

## 2011-09-27 DIAGNOSIS — R188 Other ascites: Secondary | ICD-10-CM

## 2011-09-27 DIAGNOSIS — E876 Hypokalemia: Secondary | ICD-10-CM

## 2011-09-27 DIAGNOSIS — K921 Melena: Secondary | ICD-10-CM

## 2011-09-27 DIAGNOSIS — K7030 Alcoholic cirrhosis of liver without ascites: Secondary | ICD-10-CM

## 2011-09-27 LAB — BASIC METABOLIC PANEL WITH GFR
BUN: 7 mg/dL (ref 6–23)
CO2: 27 meq/L (ref 19–32)
Calcium: 7.7 mg/dL — ABNORMAL LOW (ref 8.4–10.5)
Chloride: 99 meq/L (ref 96–112)
Creatinine, Ser: 0.67 mg/dL (ref 0.50–1.10)
GFR calc Af Amer: >90 mL/min
GFR calc non Af Amer: >90 mL/min
Glucose, Bld: 101 mg/dL — ABNORMAL HIGH (ref 70–99)
Potassium: 5.2 meq/L — ABNORMAL HIGH (ref 3.5–5.1)
Sodium: 132 meq/L — ABNORMAL LOW (ref 135–145)

## 2011-09-27 MED ORDER — SPIRONOLACTONE 50 MG PO TABS: 50.0000 mg | ORAL_TABLET | Freq: Every day | ORAL | Status: DC

## 2011-09-27 MED ORDER — PANTOPRAZOLE SODIUM 40 MG PO TBEC: 40.0000 mg | DELAYED_RELEASE_TABLET | Freq: Two times a day (BID) | ORAL | Status: DC

## 2011-09-27 MED ORDER — HYDROCODONE-ACETAMINOPHEN 5-325 MG PO TABS: 1.0000 | ORAL_TABLET | Freq: Four times a day (QID) | ORAL | Status: DC | PRN

## 2011-09-27 MED ORDER — UNABLE TO FIND: Status: DC

## 2011-09-27 MED ORDER — FUROSEMIDE 20 MG PO TABS: 20.0000 mg | ORAL_TABLET | Freq: Every day | ORAL | Status: DC

## 2011-09-27 NOTE — Progress Notes (Signed)
Nutrition Note  RD paged by RN. Pt had questions regarding low sodium diet.  RD reviewed Low-Sodium Guidelines with pt and discussed ideas of how to decrease sodium in diet. Pt asked appropriate questions and verbalized understanding.  Pt to d/c today. RD provided contact information if further questions arise.  Adair Laundry Pager #: [phone]

## 2011-09-27 NOTE — Progress Notes (Signed)
Subjective: Arm still hurting. Less troubles with abdominal distention. Trouble sleeping and staying comfortable.  Objective: Vital signs in last 24 hours: Temp:  [97.8 F (36.6 C)-98.9 F (37.2 C)] 98.7 F (37.1 C) (06/11 0930) Pulse Rate:  [97-117] 111  (06/11 0930) Resp:  [18-20] 20  (06/11 0930) BP: (90-115)/(64-75) 101/72 mmHg (06/11 0930) SpO2:  [95 %-98 %] 98 % (06/11 0930) Weight:  [50.2 kg (110 lb 10.7 oz)] 50.2 kg (110 lb 10.7 oz) (06/10 2131) Weight change: 0.077 kg (2.7 oz) Last BM Date: 09/26/11  PE: GEN:  Chronically ill-appearing, is in NAD  Lab Results: CMP     Component Value Date/Time   NA 132* 09/27/2011 0500   K 5.2* 09/27/2011 0500   CL 99 09/27/2011 0500   CO2 27 09/27/2011 0500   GLUCOSE 101* 09/27/2011 0500   GLUCOSE 97 11/30/2009   BUN 7 09/27/2011 0500   CREATININE 0.67 09/27/2011 0500   CALCIUM 7.7* 09/27/2011 0500   CALCIUM 8.4 07/26/2011 1329   PROT 5.3* 09/25/2011 0605   ALBUMIN 1.8* 09/25/2011 0605   AST 38* 09/25/2011 0605   ALT <5 09/25/2011 0605   ALKPHOS 283* 09/25/2011 0605   BILITOT 1.5* 09/25/2011 0605   GFRNONAA >90 09/27/2011 0500   GFRAA >90 09/27/2011 0500   Assessment:  1.  Alcohol-mediated cirrhosis. 2.  Reflux > candida esophagitis. 3.  Ascites. 4.  Hypokalemia, resolved.  Plan:  1.  Needs PPI, furosemide 20 qd, spironolactone 50 qd upon discharge. 2.  Low sodium diet. 3.  Patient is being discharged today, and will have outpatient CMP checked by her PCP. 4.  Will sign-off; please call with questions; will arrange outpatient follow-up with [place] in the next couple weeks; thank you for the consultation.   Freddy Jaksch 09/27/2011, 11:08 AM

## 2011-10-05 NOTE — Discharge Summary (Signed)
Physician Discharge Summary  Patient ID: SHAQUERA ANSLEY MRN: [id number] DOB/AGE: 1950-07-11 61 y.o.  Admit date: 09/22/2011 Discharge date: 10/05/2011  Primary Care Physician:  Willow Ora, MD GI: Deboraha Sprang  Discharge Diagnoses:   1. Alcoholic cirrhosis 2. Portal hypertension 3. Ascites 4. Hypokalemia 5. Recent R humerus fracture 6. Esophagitis refllux vs Candida    Medication List  As of 10/05/2011 10:37 PM   STOP taking these medications         metoprolol succinate 25 MG 24 hr tablet         TAKE these medications         CALCIUM + D PO   Take 1,600 mg by mouth daily.      furosemide 20 MG tablet   Commonly known as: LASIX   Take 1 tablet (20 mg total) by mouth daily.      HYDROcodone-acetaminophen 5-325 MG per tablet   Commonly known as: NORCO   Take 1 tablet by mouth every 6 (six) hours as needed. For pain      pantoprazole 40 MG tablet   Commonly known as: PROTONIX   Take 1 tablet (40 mg total) by mouth 2 (two) times daily before a meal.      spironolactone 50 MG tablet   Commonly known as: ALDACTONE   Take 1 tablet (50 mg total) by mouth daily.      UNABLE TO FIND   Bmet in 2 days           Disposition and Follow-up:  PCP in 1 week [facility] in 2 weeks [facility] in 2 days  Consults: [facility] /Dr.Outlaw  Significant Diagnostic Studies:  EGD 6/7: erosive esophagitis  Brief H and P: Ms. Hansman is a 60/F diagnosed with Alcoholic liver disease in April after which she quit drinking presents to ER with increasing abdominal pain and distension for 3 weeks. She describes the abdominal pain as diffuse, constant, associated with subjective fevers and chills off and on for 2 weeks.  In addition also reports intermittent nausea and vomiting for several weeks, non bloody or bilious.  Yesterday she had a loose stool that was black  Hospital Course:  1. Decompensated cirrhosis with Ascites  Recently quit ETOH, 2 months ago  Hepatitis panel negative in April  CT with  evidence of portal hypertension  S/p paracentesis 6/6 with 2.2L fluid removal, no evidence of SBP, Cultures negative  AFP -normal, 3.0  Appreciate Dr.Outlaw's input, will repeat 6/10 Started on lasix and Aldactone Will need bmet checked in 2 days  Close FU with [facility] 2. Erosive esophagitis s/p EGD 6/7, continue Nystatin, FU biopsy, changed PPI to PO  3. Alcoholic liver disease -quit ETOH in April  4. Hypokalemia-replaced 5. Hyponatremia: due to volume removal, fluid shifts, improved, stable on diuretic  Time spent on Discharge:  Signed: Alianny Toelle [facility]  10/05/2011, 10:37 PM

## 2011-10-10 ENCOUNTER — Inpatient Hospital Stay (HOSPITAL_COMMUNITY)
Admission: EM | Admit: 2011-10-10 | Discharge: 2011-10-12 | DRG: 948 | Disposition: A | Discharge status: Home / Self Care | Payer: Medicare Other | Attending: Internal Medicine | Admitting: Internal Medicine

## 2011-10-10 ENCOUNTER — Encounter (HOSPITAL_COMMUNITY): Payer: Self-pay | Admitting: *Deleted

## 2011-10-10 DIAGNOSIS — Z9181 History of falling: Secondary | ICD-10-CM

## 2011-10-10 DIAGNOSIS — R109 Unspecified abdominal pain: Secondary | ICD-10-CM

## 2011-10-10 DIAGNOSIS — M810 Age-related osteoporosis without current pathological fracture: Secondary | ICD-10-CM | POA: Diagnosis present

## 2011-10-10 DIAGNOSIS — R188 Other ascites: Secondary | ICD-10-CM | POA: Diagnosis present

## 2011-10-10 DIAGNOSIS — S98139A Complete traumatic amputation of one unspecified lesser toe, initial encounter: Secondary | ICD-10-CM

## 2011-10-10 DIAGNOSIS — I10 Essential (primary) hypertension: Secondary | ICD-10-CM | POA: Diagnosis present

## 2011-10-10 DIAGNOSIS — K7030 Alcoholic cirrhosis of liver without ascites: Secondary | ICD-10-CM | POA: Diagnosis present

## 2011-10-10 DIAGNOSIS — B9789 Other viral agents as the cause of diseases classified elsewhere: Secondary | ICD-10-CM | POA: Diagnosis present

## 2011-10-10 DIAGNOSIS — Z96649 Presence of unspecified artificial hip joint: Secondary | ICD-10-CM

## 2011-10-10 DIAGNOSIS — K709 Alcoholic liver disease, unspecified: Secondary | ICD-10-CM | POA: Diagnosis present

## 2011-10-10 DIAGNOSIS — F1020 Alcohol dependence, uncomplicated: Secondary | ICD-10-CM | POA: Diagnosis present

## 2011-10-10 LAB — COMPREHENSIVE METABOLIC PANEL
Albumin: 2.3 g/dL — ABNORMAL LOW (ref 3.5–5.2)
BUN: 7 mg/dL (ref 6–23)
Chloride: 101 mEq/L (ref 96–112)
Creatinine, Ser: 0.67 mg/dL (ref 0.50–1.10)
GFR calc Af Amer: >90 mL/min (ref >90)
GFR calc non Af Amer: >90 mL/min (ref >90)
Glucose, Bld: 124 mg/dL — ABNORMAL HIGH (ref 70–99)
Total Bilirubin: 1.7 mg/dL — ABNORMAL HIGH (ref 0.3–1.2)

## 2011-10-10 LAB — DIFFERENTIAL
Basophils Relative: 0 % (ref 0–1)
Monocytes Absolute: 1 10*3/uL (ref 0.1–1.0)
Monocytes Relative: 10 % (ref 3–12)
Neutro Abs: 7 10*3/uL (ref 1.7–7.7)

## 2011-10-10 LAB — URINALYSIS, ROUTINE W REFLEX MICROSCOPIC
Bilirubin Urine: NEGATIVE
Glucose, UA: NEGATIVE mg/dL
Ketones, ur: NEGATIVE mg/dL
pH: 6 (ref 5.0–8.0)

## 2011-10-10 LAB — URINE MICROSCOPIC-ADD ON

## 2011-10-10 LAB — CBC
HCT: 36.1 % (ref 36.0–46.0)
Hemoglobin: 12 g/dL (ref 12.0–15.0)
MCH: 33.6 pg (ref 26.0–34.0)
MCHC: 33.2 g/dL (ref 30.0–36.0)

## 2011-10-10 MED ORDER — SODIUM CHLORIDE 0.9 % IV SOLN
INTRAVENOUS | Status: DC
  Administered 2011-10-10: via INTRAVENOUS

## 2011-10-10 MED ORDER — ONDANSETRON HCL 4 MG/2ML IJ SOLN
4.0000 mg | Freq: Once | INTRAMUSCULAR | Status: AC
  Administered 2011-10-10: 4 mg via INTRAVENOUS
  Filled 2011-10-10: qty 2

## 2011-10-10 MED ORDER — HYDROMORPHONE HCL PF 1 MG/ML IJ SOLN
1.0000 mg | Freq: Once | INTRAMUSCULAR | Status: AC
  Administered 2011-10-10: 1 mg via INTRAVENOUS
  Filled 2011-10-10: qty 1

## 2011-10-10 NOTE — ED Notes (Signed)
The pt has had abd distention and no appetite since yesterday.  She is unable to hold liquids down today.  No diarrhea.  She was here for the same a few weeks ago

## 2011-10-11 ENCOUNTER — Encounter (HOSPITAL_COMMUNITY): Payer: Self-pay | Admitting: Radiology

## 2011-10-11 ENCOUNTER — Inpatient Hospital Stay (HOSPITAL_COMMUNITY): Payer: Medicare Other

## 2011-10-11 ENCOUNTER — Observation Stay (HOSPITAL_COMMUNITY): Payer: Medicare Other

## 2011-10-11 DIAGNOSIS — R188 Other ascites: Secondary | ICD-10-CM

## 2011-10-11 DIAGNOSIS — K7030 Alcoholic cirrhosis of liver without ascites: Secondary | ICD-10-CM

## 2011-10-11 DIAGNOSIS — R112 Nausea with vomiting, unspecified: Secondary | ICD-10-CM

## 2011-10-11 DIAGNOSIS — R1084 Generalized abdominal pain: Secondary | ICD-10-CM

## 2011-10-11 LAB — GRAM STAIN

## 2011-10-11 LAB — BODY FLUID CELL COUNT WITH DIFFERENTIAL

## 2011-10-11 LAB — PROTIME-INR
INR: 1.33 (ref 0.00–1.49)
Prothrombin Time: 16.7 seconds — ABNORMAL HIGH (ref 11.6–15.2)

## 2011-10-11 LAB — LACTATE DEHYDROGENASE: LDH: 155 U/L (ref 94–250)

## 2011-10-11 LAB — PATHOLOGIST SMEAR REVIEW

## 2011-10-11 MED ORDER — LIDOCAINE HCL (PF) 1 % IJ SOLN
5.0000 mL | Freq: Once | INTRAMUSCULAR | Status: AC
  Administered 2011-10-11: 5 mL

## 2011-10-11 MED ORDER — PIPERACILLIN-TAZOBACTAM 3.375 G IVPB
3.3750 g | Freq: Once | INTRAVENOUS | Status: AC
  Administered 2011-10-11: 3.375 g via INTRAVENOUS
  Filled 2011-10-11: qty 50

## 2011-10-11 MED ORDER — ACETAMINOPHEN 325 MG PO TABS: 650.0000 mg | ORAL_TABLET | Freq: Four times a day (QID) | ORAL | Status: DC | PRN

## 2011-10-11 MED ORDER — ALBUMIN HUMAN 25 % IV SOLN
25.0000 g | Freq: Once | INTRAVENOUS | Status: AC
  Administered 2011-10-11: 25 g via INTRAVENOUS
  Filled 2011-10-11 (×2): qty 100

## 2011-10-11 MED ORDER — BOOST / RESOURCE BREEZE PO LIQD
1.0000 | Freq: Three times a day (TID) | ORAL | Status: DC
  Administered 2011-10-11 – 2011-10-12 (×4): 1 via ORAL

## 2011-10-11 MED ORDER — HYDROMORPHONE HCL PF 1 MG/ML IJ SOLN
1.0000 mg | Freq: Once | INTRAMUSCULAR | Status: AC
  Administered 2011-10-11: 1 mg via INTRAVENOUS
  Filled 2011-10-11: qty 1

## 2011-10-11 MED ORDER — ONDANSETRON HCL 4 MG/2ML IJ SOLN
4.0000 mg | Freq: Four times a day (QID) | INTRAMUSCULAR | Status: DC | PRN
  Administered 2011-10-11 – 2011-10-12 (×3): 4 mg via INTRAVENOUS
  Filled 2011-10-11 (×3): qty 2

## 2011-10-11 MED ORDER — LIDOCAINE HCL (PF) 1 % IJ SOLN
INTRAMUSCULAR | Status: AC
  Filled 2011-10-11: qty 5

## 2011-10-11 MED ORDER — HYDROMORPHONE HCL PF 1 MG/ML IJ SOLN
0.5000 mg | INTRAMUSCULAR | Status: DC | PRN
  Administered 2011-10-11 (×4): 0.5 mg via INTRAVENOUS
  Filled 2011-10-11 (×4): qty 1

## 2011-10-11 MED ORDER — DEXTROSE 5 % IV SOLN
2.0000 g | Freq: Three times a day (TID) | INTRAVENOUS | Status: DC
  Filled 2011-10-11 (×2): qty 2

## 2011-10-11 MED ORDER — POTASSIUM CHLORIDE CRYS ER 20 MEQ PO TBCR
20.0000 meq | EXTENDED_RELEASE_TABLET | Freq: Once | ORAL | Status: AC
  Administered 2011-10-11: 20 meq via ORAL
  Filled 2011-10-11: qty 1

## 2011-10-11 MED ORDER — ZOLPIDEM TARTRATE 5 MG PO TABS
5.0000 mg | ORAL_TABLET | Freq: Once | ORAL | Status: AC
  Administered 2011-10-12: 5 mg via ORAL
  Filled 2011-10-11: qty 1

## 2011-10-11 MED ORDER — SODIUM CHLORIDE 0.9 % IJ SOLN: 3.0000 mL | Freq: Two times a day (BID) | INTRAMUSCULAR | Status: DC

## 2011-10-11 MED ORDER — SPIRONOLACTONE 100 MG PO TABS
100.0000 mg | ORAL_TABLET | Freq: Every day | ORAL | Status: DC
  Administered 2011-10-11 – 2011-10-12 (×2): 100 mg via ORAL
  Filled 2011-10-11 (×2): qty 1

## 2011-10-11 MED ORDER — FUROSEMIDE 20 MG PO TABS
20.0000 mg | ORAL_TABLET | Freq: Every day | ORAL | Status: DC
  Administered 2011-10-11 – 2011-10-12 (×2): 20 mg via ORAL
  Filled 2011-10-11 (×2): qty 1

## 2011-10-11 MED ORDER — ONDANSETRON HCL 4 MG/2ML IJ SOLN
INTRAMUSCULAR | Status: AC
  Filled 2011-10-11: qty 2

## 2011-10-11 MED ORDER — ONDANSETRON HCL 4 MG PO TABS
4.0000 mg | ORAL_TABLET | Freq: Four times a day (QID) | ORAL | Status: DC | PRN
  Administered 2011-10-11: 4 mg via ORAL
  Filled 2011-10-11: qty 1

## 2011-10-11 MED ORDER — ACETAMINOPHEN 650 MG RE SUPP: 650.0000 mg | Freq: Four times a day (QID) | RECTAL | Status: DC | PRN

## 2011-10-11 MED ORDER — SPIRONOLACTONE 50 MG PO TABS
50.0000 mg | ORAL_TABLET | Freq: Every day | ORAL | Status: DC
  Filled 2011-10-11: qty 1

## 2011-10-11 MED ORDER — ONDANSETRON HCL 4 MG/2ML IJ SOLN
4.0000 mg | Freq: Once | INTRAMUSCULAR | Status: AC
  Administered 2011-10-11: 4 mg via INTRAVENOUS

## 2011-10-11 MED ORDER — PANTOPRAZOLE SODIUM 40 MG PO TBEC
40.0000 mg | DELAYED_RELEASE_TABLET | Freq: Two times a day (BID) | ORAL | Status: DC
  Administered 2011-10-11 – 2011-10-12 (×4): 40 mg via ORAL
  Filled 2011-10-11 (×5): qty 1

## 2011-10-11 MED ORDER — POTASSIUM CHLORIDE CRYS ER 20 MEQ PO TBCR
40.0000 meq | EXTENDED_RELEASE_TABLET | Freq: Once | ORAL | Status: AC
  Administered 2011-10-11: 40 meq via ORAL
  Filled 2011-10-11: qty 2

## 2011-10-11 NOTE — Care Management Note (Signed)
    Page 1 of 1   10/12/2011     11:30:05 AM   CARE MANAGEMENT NOTE 10/12/2011  Patient:  Kathryn [name], Kathryn [name]   Account Number:  [id number]  Date Initiated:  10/11/2011  Documentation initiated by:  Letha Cape  Subjective/Objective Assessment:   dx Ascites  admit- lives with spouse.  pta independent.     Action/Plan:   pt eval- rec outpt pt- has an appt already for 7/[address] on [address]   Anticipated DC Date:  10/12/2011   Anticipated DC Plan:  HOME/SELF CARE      DC Planning Services  CM consult      Choice offered to / List presented to:             Status of service:  Completed, signed off Medicare Important Message given?   (If response is "NO", the following Medicare IM given date fields will be blank) Date Medicare IM given:   Date Additional Medicare IM given:    Discharge Disposition:  HOME/SELF CARE  Per UR Regulation:  Reviewed for med. necessity/level of care/duration of stay  If discussed at Long Length of Stay Meetings, dates discussed:    Comments:  10/12/11 11:28 Letha Cape RN, BSN [phone] patient for dc today, she already has an appt for outpt physical therapy for 7/2,  no needs identified.  10/11/11 16:46 Letha Cape RN, BSN [phone] patient lives with spouse, pta independent.  Patient has a rolling walker, a shower chair, a scooter and a w/chair at home.   Patient has medication coverage and transportation. Physical therapy has rec outpt pt, patient states she already has an appointment with the outpt rehab center on [facility] on 10-18-11 .  NCM will continue to follow for dc needs.

## 2011-10-11 NOTE — Progress Notes (Signed)
INITIAL ADULT NUTRITION ASSESSMENT Date: 10/11/2011   Time: 10:27 AM Reason for Assessment: Nutrition Risk Report  ASSESSMENT: Female 61 y.o.  Dx: Ascites  Hx:  Past Medical History  Diagnosis Date  . Hypertension   . Osteoporosis   . Alcoholic liver disease   . High cholesterol   . Peripheral vascular disease   . Shortness of breath 09/22/11    "because of all the fluid that's built up in my stomach"  . Ascites 09/22/11    "first time for me"  . Anemia   . History of blood transfusion 07/2011    "when I had elbow surgery"  . Arthritis     "all over"  . Dysrhythmia     "irregular"    Related Meds:     . albumin human  25 g Intravenous Once  . feeding supplement  1 Container Oral TID BM  . furosemide  20 mg Oral Daily  .  HYDROmorphone (DILAUDID) injection  1 mg Intravenous Once  .  HYDROmorphone (DILAUDID) injection  1 mg Intravenous Once  . lidocaine  5 mL Other Once  . ondansetron  4 mg Intravenous Once  . ondansetron  4 mg Intravenous Once  . pantoprazole  40 mg Oral BID AC  . piperacillin-tazobactam (ZOSYN)  IV  3.375 g Intravenous Once  . potassium chloride  20 mEq Oral Once  . sodium chloride  3 mL Intravenous Q12H  . spironolactone  100 mg Oral Daily  . DISCONTD: cefoTAXime (CLAFORAN) IV  2 g Intravenous Q8H  . DISCONTD: sodium chloride  3 mL Intravenous Q12H  . DISCONTD: spironolactone  50 mg Oral Daily     Ht: 5\' 1"  (154.9 cm)  Wt: 100 lb (45.36 kg)  Ideal Wt: 47.8 kg  % Ideal Wt: 95%  Usual Wt: 109 lbs per pt report Wt Readings from Last 15 Encounters:  10/11/11 100 lb (45.36 kg)  09/26/11 110 lb 10.7 oz (50.2 kg)  09/26/11 110 lb 10.7 oz (50.2 kg)  08/05/11 110 lb (49.896 kg)  07/25/11 108 lb (48.988 kg)  07/25/11 108 lb (48.988 kg)  09/16/09 112 lb (50.803 kg)  08/17/09 113 lb 6.4 oz (51.438 kg)  06/12/08 120 lb 6.4 oz (54.613 kg)  05/20/08 117 lb 8 oz (53.298 kg)  05/06/08 120 lb 12.8 oz (54.795 kg)  04/03/08 119 lb (53.978 kg)    03/17/08 121 lb 6.4 oz (55.067 kg)  03/10/08 117 lb 3.2 oz (53.162 kg)  02/21/07 114 lb (51.71 kg)    % Usual Wt: 92%  Body mass index is 18.89 kg/(m^2). Pt is underweight   Food/Nutrition Related Hx: Pt reports decreased intake r/t pain and nausea, pt has weight loss from last admission.   Labs:  CMP     Component Value Date/Time   NA 139 10/10/2011 1952   K 3.3* 10/10/2011 1952   CL 101 10/10/2011 1952   CO2 24 10/10/2011 1952   GLUCOSE 124* 10/10/2011 1952   GLUCOSE 97 11/30/2009   BUN 7 10/10/2011 1952   CREATININE 0.67 10/10/2011 1952   CALCIUM 8.7 10/10/2011 1952   CALCIUM 8.4 07/26/2011 1329   PROT 6.9 10/10/2011 1952   ALBUMIN 2.3* 10/10/2011 1952   AST 53* 10/10/2011 1952   ALT 11 10/10/2011 1952   ALKPHOS 478* 10/10/2011 1952   BILITOT 1.7* 10/10/2011 1952   GFRNONAA >90 10/10/2011 1952   GFRAA >90 10/10/2011 1952    No intake or output data in the 24 hours ending 10/11/11  1029   Diet Order: Clear Liquid  Supplements/Tube Feeding: Resource Breeze TID  IVF:    DISCONTD: sodium chloride Last Rate: 125 mL/hr at 10/10/11 2350    Estimated Nutritional Needs:   Kcal: 1600-1800  Protein: 50-60 gm  Fluid:  1.6 -1.8 L   Pt reports trying to eat well at home, continued to drink Breeze BID. Pt has had 10 lb (9%) weight loss in 15 days, severe weight loss. Weight loss is likely greater then this as pt admitted with large amount of fluid in abdomin. Per pt, not planning on paracentesis at this time, increase in diuretics.  At last admission, pt dx with moderate malnutrition in the context of acute illness. Pt now meets criteria for severe malnutrition in the context of acute illness 2/2 weight loss of 9% in 15 days and appears to have moderate fat wasting evident at the temples and arms.   NUTRITION DIAGNOSIS: -Inadequate oral intake (NI-2.1).  Status: Ongoing  RELATED TO: N/V, pain   AS EVIDENCE BY: weight loss  MONITORING/EVALUATION(Goals): Goal: PO intake of meals and  supplements to be >/=50% Monitor: PO intake, weight, labs, I/O's  EDUCATION NEEDS: -No education needs identified at this time -Pt reports she is following the low sodium diet she was educated about at last admission  INTERVENTION: 1. Continue Resource Breeze TID 2. RD will continue to follow  Dietitian [phone]  DOCUMENTATION CODES Per approved criteria  -Severe malnutrition in the context of acute illness or injury -Underweight    KOWALSKI, Kathryn [name] 10/11/2011, 10:27 AM

## 2011-10-11 NOTE — Progress Notes (Signed)
Patient ID: Kathryn [name], female   DOB: 08-16-1950, 61 y.o.   MRN: [id number] [facility] PROGRESS NOTE  Kathryn [name]:[id number] DOB: 1950-06-24 DOA: 10/10/2011 PCP: Kathryn Ora, MD  Brief narrative: 61 year-old female with recent diagnosis of alcoholic liver cirrhosis and was just recently discharged 2 weeks ago after patient had ascites and had paracentesis twice then presents with complaints of abdominal distention pain and nausea and vomiting. Patient states that since her discharge last she was feeling better and was taking her medications as advised. But since yesterday morning she started developing diffuse abdominal pain and worsening of her distention and had at least twice vomiting. The pain is diffuse. Denies any fever chills or diarrhea. In the ER patient was found to have distended abdomen compatible with ascites and had diagnostic paracentesis done by ER physician. Since patient is still having pain will be admitted for further management   Consultants: Gastroenterology per patient request  Procedures: Scheduled for paracentesis 10/11/2011  Antibiotics:  HPI/Subjective: My abdomen is sore and distended.  This distension has been going on for about 2 weeks.  Patient reports recent falls.  Reports vomiting this morning before clear liquid breakfast.  Objective: Filed Vitals:   10/10/11 2229 10/11/11 0100 10/11/11 0324 10/11/11 0515  BP: 126/87 133/88 118/83 122/82  Pulse: 114 99 93 90  Temp: 99.1 F (37.3 C)  98.9 F (37.2 C) 98.1 F (36.7 C)  TempSrc: Oral  Oral Oral  Resp: 16 16 17 17   Height:    5\' 1"  (1.549 m)  Weight:    45.36 kg (100 lb)  SpO2: 98% 96% 97% 96%   No intake or output data in the 24 hours ending 10/11/11 1349  Exam:   General:  A&O, NAD, appears weathered.  Cardiovascular: RRR, no M/R/G  Respiratory: CTA no W/C/R  Abdomen: Significantly distended, not tight.  Tender to palpation bilaterally in lower quadrants.  + bowel  sounds  Extremities:  No swelling.  Right Arm with well healed scar from recent fracture.  Left foot with chronic swelling and toe amputation.  Data Reviewed: Basic Metabolic Panel:  Lab 10/10/11 9562  NA 139  K 3.3*  CL 101  CO2 24  GLUCOSE 124*  BUN 7  CREATININE 0.67  CALCIUM 8.7  MG --  PHOS --   Liver Function Tests:  Lab 10/10/11 1952  AST 53*  ALT 11  ALKPHOS 478*  BILITOT 1.7*  PROT 6.9  ALBUMIN 2.3*    Lab 10/11/11 0630  LIPASE 14  AMYLASE --   No results found for this basename: AMMONIA:5 in the last 168 hours CBC:  Lab 10/10/11 1952  WBC 10.4  NEUTROABS 7.0  HGB 12.0  HCT 36.1  MCV 101.1*  PLT 242     Recent Results (from the past 240 hour(s))  BODY FLUID CULTURE     Status: Normal (Preliminary result)   Collection Time   10/11/11  2:05 AM      Component Value Range Status Comment   Specimen Description PERITONEAL CAVITY FLUID   Final    Special Requests FLUID PERITONEAL CAVITY   Final    Gram Stain     Final    Value: FEW WBC PRESENT,BOTH PMN AND MONONUCLEAR     NO ORGANISMS SEEN     Gram Stain Report Called to,Read Back By and Verified With: Gram Stain Report Called to,Read Back By and Verified With: ED SELOM M RN 10/11/2011 0300 ACAINJ Performed at [facility]   Culture NO GROWTH   Final    Report Status PENDING   Incomplete   GRAM STAIN     Status: Normal   Collection Time   10/11/11  2:05 AM      Component Value Range Status Comment   Specimen Description FLUID PERITONEAL CAVITY   Final    Special Requests NONE   Final    Gram Stain     Final    Value: FEW WBC PRESENT, PREDOMINANTLY PMN     NO ORGANISMS SEEN     CALLED ED SELOM,M RN 10/11/11 0300 ACAINJ   Report Status 10/11/2011 FINAL   Final      Studies: Ct Abdomen Pelvis Wo Contrast  10/11/2011  *RADIOLOGY REPORT*  Clinical Data: Abdominal distension and loss of appetite. Vomiting.  CT ABDOMEN AND PELVIS WITHOUT CONTRAST  Technique:  Multidetector CT imaging of the  abdomen and pelvis was performed following the standard protocol without intravenous contrast.  Comparison: 09/22/2011  Findings: Small bilateral pleural effusions with basilar atelectasis in the lungs.  Moderate sized esophageal hiatal hernia with fluid in the lower esophagus suggesting reflux.  Changes of hepatic cirrhosis with enlarged lateral segment left lobe and atrophy of the right lobe.  Geographic hypo attenuation in the left lobe of the liver is similar to previous study and probably represents focal fatty infiltration.  Spleen size is normal.  Increased density within the gallbladder consistent with sludge.  There is moderate free fluid throughout the abdomen and pelvis.  Density measurements are consistent with ascites.  The amount of ascites appears relatively stable since the previous study.  Small accessory spleen.  The pancreas, adrenal glands, abdominal aorta, and retroperitoneal lymph nodes are unremarkable. There is a stone in the midportion of the left kidney measuring about 4 mm diameter.  No hydronephrosis in either kidney.  No free air in the abdomen.  Pelvis:  Postoperative changes in the right hip with right hip arthroplasty causing streak artifact.  The uterus and adnexal structures are not enlarged.  The uterus does demonstrate a nodular contour suggesting fibroids.  The appendix is not visualized.  No evidence of diverticulitis.  Degenerative changes throughout the lumbar spine.  IMPRESSION: Changes of hepatic cirrhosis with diffuse abdominal and pelvic ascites similar to previous study.  Gallbladder sludge.  Esophageal hiatal hernia with presumed reflux into the distal esophagus. Small bilateral pleural effusions with basilar atelectasis.  Original Report Authenticated By: Marlon Pel, M.D.    Scheduled Meds:   . albumin human  25 g Intravenous Once  . feeding supplement  1 Container Oral TID BM  . furosemide  20 mg Oral Daily  .  HYDROmorphone (DILAUDID) injection  1 mg  Intravenous Once  .  HYDROmorphone (DILAUDID) injection  1 mg Intravenous Once  . lidocaine  5 mL Other Once  . ondansetron  4 mg Intravenous Once  . ondansetron  4 mg Intravenous Once  . pantoprazole  40 mg Oral BID AC  . piperacillin-tazobactam (ZOSYN)  IV  3.375 g Intravenous Once  . potassium chloride  20 mEq Oral Once  . potassium chloride  40 mEq Oral Once  . sodium chloride  3 mL Intravenous Q12H  . spironolactone  100 mg Oral Daily  . DISCONTD: cefoTAXime (CLAFORAN) IV  2 g Intravenous Q8H  . DISCONTD: sodium chloride  3 mL Intravenous Q12H  . DISCONTD: spironolactone  50 mg Oral Daily   Continuous Infusions:   . DISCONTD: sodium chloride 125  mL/hr at 10/10/11 2350     Assessment/Plan: 1. Vomiting.  Patient admitted with concern for SBP.  Diagnostic tap drawn in the ED showed PMNs of 150 ruling out SBP.  Vomiting started acutely and is likely viral illness.  Will treat with supportive care.  2.  Decompensating Cirrhosis with Ascites.  Patient required two taps in recent past and will be tapped again today to remove ascites that is causing great discomfort. After paracentesis she will received albumin.  Her dose of spironolactone was increased 6/25.  We anticipate lasix and spironolactone will be increased again on 6/26 to help prevent re accumulation of ascites. Daily weights, Is&Os, low salt diet.  At the patient's request, [facility] was consulted to assist with management of her cirrhosis.  We appreciate their recommendations & guidance.  3.  HTN.  BP stable.  Not currently on any antihypertensive medications.  Disposition Plan:  To home with husband likely 10/12/2011.   Kathryn [name], Kathryn [name] [facility] Pager [phone]  If 7PM-7AM, please contact night-coverage [email or web address] Password [email or web address] 10/11/2011, 1:49 PM   LOS: 1 day   Attending I have seen and examined the patient, I agree with the assessment and plan. Patient admitted with abd pain and  vomiting, Diagnostic tap done in the ED was not consistent with SBP. Abdominal pain -?2/2 to tense ascites, have increased Aldactone to 100 mg, will slowly need to increase Aldactone to 200 mg/day. Hopefully going up on her diuretics will minimize need for paracentesis. Will give one dose of albumin after paracentesis. Vomiting etiology not very clear-belly is soft but with ascites-will slowly advance diet and provide supportive care. Hopefully she will feel much better with paracentesis.  S Tanyla Stege

## 2011-10-11 NOTE — H&P (Signed)
Kathryn [name] is an 61 y.o. female.   PCP - Dr.Jose Paz. GI - Dr.Outlaw. Chief Complaint: Abdominal pain. HPI: 61 year-old female with recent diagnosis of alcoholic liver cirrhosis and was just recently discharged 2 weeks ago after patient had ascites and had paracentesis twice then presents with complaints of abdominal distention pain and nausea and vomiting. Patient states that since her discharge last she was feeling better and was taking her medications as advised. But since yesterday morning she started developing diffuse abdominal pain and worsening of her distention and had at least twice vomiting. The pain is diffuse. Denies any fever chills or diarrhea. In the ER patient was found to have distended abdomen compatible with ascites and had diagnostic paracentesis done by ER physician. Since patient is still having pain will be admitted for further management. Patient denies any shortness of breath or chest pain.  Past Medical History  Diagnosis Date  . Hypertension   . Osteoporosis   . Alcoholic liver disease   . High cholesterol   . Peripheral vascular disease   . Shortness of breath 09/22/11    "because of all the fluid that's built up in my stomach"  . Ascites 09/22/11    "first time for me"  . Anemia   . History of blood transfusion 07/2011    "when I had elbow surgery"  . Arthritis     "all over"  . Dysrhythmia     "irregular"    Past Surgical History  Procedure Date  . Fracture surgery   . Amputation     two toes on left foot  . Bunionectomy     both feet  . Arthroscopic repair acl     right knee  . Joint replacement     right hip   . Orif humerus fracture 07/26/2011    Procedure: OPEN REDUCTION INTERNAL FIXATION (ORIF) DISTAL HUMERUS FRACTURE;  Surgeon: Budd Palmer, MD;  Location: [facility] OR;  Service: Orthopedics;  Laterality: Right;  . Total hip arthroplasty ~ 2003    right  . Tubal ligation 1980  . Paracentesis 09/22/11    2.2L  . Esophagogastroduodenoscopy 09/23/2011      Procedure: ESOPHAGOGASTRODUODENOSCOPY (EGD);  Surgeon: Shirley Friar, MD;  Location[facility] ENDOSCOPY;  Service: Endoscopy;  Laterality: N/A;    History reviewed. No pertinent family history. Social History:  reports that she has never smoked. She has never used smokeless tobacco. She reports that she drinks about 3.6 ounces of alcohol per week. She reports that she does not use illicit drugs.  Allergies: No Known Allergies   (Not in a hospital admission)  Results for orders placed during the hospital encounter of 10/10/11 (from the past 48 hour(s))  URINALYSIS, ROUTINE W REFLEX MICROSCOPIC     Status: Abnormal   Collection Time   10/10/11  7:26 PM      Component Value Range Comment   Color, Urine YELLOW  YELLOW    APPearance CLEAR  CLEAR    Specific Gravity, Urine 1.009  1.005 - 1.030    pH 6.0  5.0 - 8.0    Glucose, UA NEGATIVE  NEGATIVE mg/dL    Hgb urine dipstick NEGATIVE  NEGATIVE    Bilirubin Urine NEGATIVE  NEGATIVE    Ketones, ur NEGATIVE  NEGATIVE mg/dL    Protein, ur NEGATIVE  NEGATIVE mg/dL    Urobilinogen, UA 0.2  0.0 - 1.0 mg/dL    Nitrite NEGATIVE  NEGATIVE    Leukocytes, UA TRACE (*)  NEGATIVE   URINE MICROSCOPIC-ADD ON     Status: Abnormal   Collection Time   10/10/11  7:26 PM      Component Value Range Comment   Squamous Epithelial / LPF FEW (*) RARE    WBC, UA 0-2  <3 WBC/hpf   CBC     Status: Abnormal   Collection Time   10/10/11  7:52 PM      Component Value Range Comment   WBC 10.4  4.0 - 10.5 K/uL    RBC 3.57 (*) 3.87 - 5.11 MIL/uL    Hemoglobin 12.0  12.0 - 15.0 g/dL    HCT 16.1  09.6 - 04.5 %    MCV 101.1 (*) 78.0 - 100.0 fL    MCH 33.6  26.0 - 34.0 pg    MCHC 33.2  30.0 - 36.0 g/dL    RDW 40.9  81.1 - 91.4 %    Platelets 242  150 - 400 K/uL   DIFFERENTIAL     Status: Normal   Collection Time   10/10/11  7:52 PM      Component Value Range Comment   Neutrophils Relative 68  43 - 77 %    Neutro Abs 7.0  1.7 - 7.7 K/uL    Lymphocytes Relative  22  12 - 46 %    Lymphs Abs 2.3  0.7 - 4.0 K/uL    Monocytes Relative 10  3 - 12 %    Monocytes Absolute 1.0  0.1 - 1.0 K/uL    Eosinophils Relative 0  0 - 5 %    Eosinophils Absolute 0.0  0.0 - 0.7 K/uL    Basophils Relative 0  0 - 1 %    Basophils Absolute 0.0  0.0 - 0.1 K/uL   COMPREHENSIVE METABOLIC PANEL     Status: Abnormal   Collection Time   10/10/11  7:52 PM      Component Value Range Comment   Sodium 139  135 - 145 mEq/L    Potassium 3.3 (*) 3.5 - 5.1 mEq/L    Chloride 101  96 - 112 mEq/L    CO2 24  19 - 32 mEq/L    Glucose, Bld 124 (*) 70 - 99 mg/dL    BUN 7  6 - 23 mg/dL    Creatinine, Ser 7.82  0.50 - 1.10 mg/dL    Calcium 8.7  8.4 - 95.6 mg/dL    Total Protein 6.9  6.0 - 8.3 g/dL    Albumin 2.3 (*) 3.5 - 5.2 g/dL    AST 53 (*) 0 - 37 U/L    ALT 11  0 - 35 U/L    Alkaline Phosphatase 478 (*) 39 - 117 U/L    Total Bilirubin 1.7 (*) 0.3 - 1.2 mg/dL    GFR calc non Af Amer >90  >90 mL/min    GFR calc Af Amer >90  >90 mL/min   GRAM STAIN     Status: Normal   Collection Time   10/11/11  2:05 AM      Component Value Range Comment   Specimen Description FLUID PERITONEAL CAVITY      Special Requests NONE      Gram Stain        Value: FEW WBC PRESENT, PREDOMINANTLY PMN     NO ORGANISMS SEEN     CALLED ED SELOM,M RN 10/11/11 0300 ACAINJ   Report Status 10/11/2011 FINAL     BODY FLUID CELL COUNT WITH DIFFERENTIAL  Status: Abnormal   Collection Time   10/11/11  2:05 AM      Component Value Range Comment   Fluid Type-FCT PERITONEAL CAVITY      Color, Fluid ORANGE (*) YELLOW    Appearance, Fluid TURBID (*) CLEAR    WBC, Fluid 156  0 - 1000 cu mm    Neutrophil Count, Fluid 18  0 - 25 %    Lymphs, Fluid 77      Monocyte-Macrophage-Serous Fluid 5 (*) 50 - 90 %    Other Cells, Fluid OTHER CELLS IDENTIFIED AS MESOTHELIAL CELLS     LACTATE DEHYDROGENASE     Status: Normal   Collection Time   10/11/11  2:06 AM      Component Value Range Comment   LDH 155  94 - 250 U/L      No results found.  Review of Systems  Constitutional: Negative.   HENT: Negative.   Eyes: Negative.   Respiratory: Negative.   Cardiovascular: Negative.   Gastrointestinal: Positive for nausea, vomiting and abdominal pain.       Abdominal distension.  Genitourinary: Negative.   Musculoskeletal: Negative.   Skin: Negative.   Neurological: Negative.   Endo/Heme/Allergies: Negative.   Psychiatric/Behavioral: Negative.     Blood pressure 118/83, pulse 93, temperature 98.9 F (37.2 C), temperature source Oral, resp. rate 17, SpO2 97.00%. Physical Exam  Constitutional: She is oriented to person, place, and time. She appears well-developed and well-nourished. No distress.  HENT:  Head: Normocephalic and atraumatic.  Right Ear: External ear normal.  Left Ear: External ear normal.  Nose: Nose normal.  Mouth/Throat: Oropharynx is clear and moist. No oropharyngeal exudate.  Eyes: Conjunctivae are normal. Pupils are equal, round, and reactive to light. Right eye exhibits no discharge. Left eye exhibits no discharge. No scleral icterus.  Neck: Normal range of motion.  Cardiovascular: Normal rate and regular rhythm.   Respiratory: Effort normal and breath sounds normal. No respiratory distress. She has no wheezes. She has no rales.  GI: Soft. Bowel sounds are normal. She exhibits distension. There is tenderness. There is no rebound and no guarding.  Musculoskeletal: Normal range of motion. She exhibits no edema and no tenderness.  Neurological: She is alert and oriented to person, place, and time.       Moves all extremities.  Skin: She is not diaphoretic.     Assessment/Plan #1. Ascites - patient's abdominal discomfort most likely is from ascites reaccumulating. A CT scan of the abdomen and pelvis has been done but the results are still pending and has to be followed. A diagnostic paracentesis fluid results have shown to be having WBC of 150 only. (Initially a cell count of 155,000 was  reported and has been corrected). At this time given her cell count SBP is unlikely. Patient was given one dose of empiric antibiotics. I am ordered therapeutic paracentesis. #2. Alcoholic liver cirrhosis - continue present medications.  Patient's CAT scan results of the abdomen and pelvis is still pending and has to be followed. I have also ordered lipase levels.  Eduard Clos 10/11/2011, 4:43 AM

## 2011-10-11 NOTE — ED Notes (Signed)
Awaiting admitting orders before taking patient to the floor.

## 2011-10-11 NOTE — Evaluation (Signed)
Physical Therapy Evaluation Patient Details Name: Kathryn [name] MRN: [id number] DOB: Jan 31, 1951 Today's Date: 10/11/2011 Time: 4098-1191 PT Time Calculation (min): 16 min  PT Assessment / Plan / Recommendation Clinical Impression  Kathryn [name] is 61 y/o female admitted with ascites. PMH including multiple falls and orthopedic surgeries most recently R humeral ORIF. Moving close to her baseline but would benefit physical therapy in the acute setting to maximize her functional activity tolerance and mobility for improved independence and safety at home. Rec pt f/u with OPPT for elbow as planned prior to admit upon d/c.      PT Assessment  Patient needs continued PT services    Follow Up Recommendations  Outpatient PT;Supervision for mobility/OOB    Barriers to Discharge        lEquipment Recommendations  None recommended by PT    Recommendations for Other Services     Frequency Min 3X/week    Precautions / Restrictions Precautions Precautions: Fall Precaution Comments: history of falls         Mobility  Bed Mobility Bed Mobility: Supine to Sit;Sit to Supine Supine to Sit: 6: Modified independent (Device/Increase time);HOB elevated (30 degrees) Sit to Supine: 6: Modified independent (Device/Increase time);HOB elevated (3o degrees) Transfers Transfers: Sit to Stand;Stand to Sit Sit to Stand: 5: Supervision;With upper extremity assist;From bed Stand to Sit: 5: Supervision;With upper extremity assist;To bed Details for Transfer Assistance: frail Ambulation/Gait Ambulation/Gait Assistance: 4: Min guard Ambulation Distance (Feet): 20 Feet Assistive device: Rolling walker Ambulation/Gait Assistance Details: slightly impulsive with tight spaces turning quickly prior to RW becing placed safely, flexed gait and shuffled steps Gait Pattern: Step-through pattern;Trunk flexed    Exercises     PT Diagnosis: Abnormality of gait;Generalized weakness;Difficulty walking  PT Problem List:  Decreased activity tolerance;Decreased mobility;Cardiopulmonary status limiting activity;Decreased range of motion;Decreased strength PT Treatment Interventions: DME instruction;Gait training;Functional mobility training;Therapeutic activities;Stair training;Therapeutic exercise;Balance training;Neuromuscular re-education;Patient/family education   PT Goals Acute Rehab PT Goals PT Goal Formulation: With patient Time For Goal Achievement: 10/18/11 Pt will go Sit to Stand: with modified independence PT Goal: Sit to Stand - Progress: Goal set today Pt will go Stand to Sit: with modified independence PT Goal: Stand to Sit - Progress: Goal set today Pt will Transfer Bed to Chair/Chair to Bed: with modified independence PT Transfer Goal: Bed to Chair/Chair to Bed - Progress: Goal set today Pt will Stand: with modified independence;3 - 5 min;with no upper extremity support PT Goal: Stand - Progress: Goal set today Pt will Ambulate: 51 - 150 feet;with modified independence;with least restrictive assistive device PT Goal: Ambulate - Progress: Goal set today Pt will Go Up / Down Stairs: 1-2 stairs;with supervision PT Goal: Up/Down Stairs - Progress: Goal set today Pt will Perform Home Exercise Program: Independently PT Goal: Perform Home Exercise Program - Progress: Goal set today  Visit Information  Last PT Received On: 10/11/11 Assistance Needed: +1    Subjective Data  Subjective: I have had a lot of orthopedic surgeries.    Prior Functioning  Home Living Lives With: Spouse Available Help at Discharge: Family;Available 24 hours/day Type of Home: House Home Access: Stairs to enter [employer] of Steps: 1 Home Layout: One level Bathroom Shower/Tub: Engineer, manufacturing systems: Standard Home Adaptive Equipment: Shower chair with back;Walker - rolling (hemi walker) Additional Comments: husband helps her get into the shower Prior Function Level of Independence: Independent  with assistive device(s) Able to Take Stairs?:  (with assistance) Driving: No Vocation: On disability Communication Communication:  No difficulties    Cognition  Overall Cognitive Status: Appears within functional limits for tasks assessed/performed Arousal/Alertness: Awake/alert Orientation Level: Appears intact for tasks assessed Behavior During Session: [facility] for tasks performed Cognition - Other Comments: a little impulsive with some movements    Extremity/Trunk Assessment Right Upper Extremity Assessment RUE ROM/Strength/Tone: Deficits RUE ROM/Strength/Tone Deficits: recent distal humeral fracture s/p ORIF (limited extension), strength not tested but grossly decreased muscle mass/tone, plan was to start OPPT for elbow July 2 RUE Sensation: [facility] Sherlynn Stalls Touch;WFL - Proprioception RUE Coordination: WFL - gross/fine motor Left Upper Extremity Assessment LUE ROM/Strength/Tone: WFL for tasks assessed LUE Sensation: WFL - Light Touch;WFL - Proprioception LUE Coordination: WFL - gross/fine motor Right Lower Extremity Assessment RLE ROM/Strength/Tone: WFL for tasks assessed RLE Sensation: WFL - Light Touch;WFL - Proprioception RLE Coordination: WFL - gross/fine motor Left Lower Extremity Assessment LLE ROM/Strength/Tone: WFL for tasks assessed LLE Sensation: WFL - Light Touch;WFL - Proprioception LLE Coordination: WFL - gross/fine motor Trunk Assessment Trunk Assessment: Kyphotic   Balance Static Standing Balance Static Standing - Balance Support: No upper extremity supported Static Standing - Level of Assistance: 5: Stand by assistance Dynamic Standing Balance Dynamic Standing - Balance Support: No upper extremity supported Dynamic Standing - Level of Assistance: 5: Stand by assistance Dynamic Standing - Comments: pt able to perform half turn to don gown with no LOB but with gaurded movement  End of Session PT - End of Session Equipment Utilized During Treatment: Gait  belt Activity Tolerance: Patient tolerated treatment well;Patient limited by fatigue Patient left: in bed;with call bell/phone within reach Nurse Communication: Mobility status   Kathryn [name] 10/11/2011, 2:10 PM

## 2011-10-11 NOTE — Progress Notes (Signed)
Utilization review complete 

## 2011-10-11 NOTE — Consult Note (Signed)
Subjective:   HPI 61 year old female with hx of alcoholic cirrhosis of the liver who was recently discharged from this hospital a couple of weeks ago because of worsening ascites. She was discharged on Lasix 20 mg daily and Aldactone 50 mg daily and a low-sodium diet. She was readmitted to the hospital with progressive worsening ascites. Today she had 3.1 L of ascitic fluid drained by interventional radiology and feels much better. She remains on Lasix 20 mg daily but her Aldactone has been increased to 100 mg daily. She had an appointment to see Dr. Dulce Sellar in 2 days in the office.  Review of Systems Denies chest pain or shortness of breath  Past Medical History  Diagnosis Date  . Hypertension   . Osteoporosis   . Alcoholic liver disease   . High cholesterol   . Peripheral vascular disease   . Shortness of breath 09/22/11    "because of all the fluid that's built up in my stomach"  . Ascites 09/22/11    "first time for me"  . Anemia   . History of blood transfusion 07/2011    "when I had elbow surgery"  . Arthritis     "all over"  . Dysrhythmia     "irregular"   Past Surgical History  Procedure Date  . Fracture surgery   . Amputation     two toes on left foot  . Bunionectomy     both feet  . Arthroscopic repair acl     right knee  . Joint replacement     right hip   . Orif humerus fracture 07/26/2011    Procedure: OPEN REDUCTION INTERNAL FIXATION (ORIF) DISTAL HUMERUS FRACTURE;  Surgeon: Budd Palmer, MD;  Location: [facility] OR;  Service: Orthopedics;  Laterality: Right;  . Total hip arthroplasty ~ 2003    right  . Tubal ligation 1980  . Paracentesis 09/22/11    2.2L  . Esophagogastroduodenoscopy 09/23/2011    Procedure: ESOPHAGOGASTRODUODENOSCOPY (EGD);  Surgeon: Shirley Friar, MD;  Location: [facility] ENDOSCOPY;  Service: Endoscopy;  Laterality: N/A;   History   Social History  . Marital Status: Married    Spouse Name: N/A    Number of Children: N/A  . Years of Education: N/A     Occupational History  . Not on file.   Social History Main Topics  . Smoking status: Never Smoker   . Smokeless tobacco: Never Used  . Alcohol Use: 3.6 oz/week    6 Shots of liquor per week     "last drink was day before OR in 07/2011"  . Drug Use: No  . Sexually Active: Not Currently   Other Topics Concern  . Not on file   Social History Narrative  . No narrative on file   family history is not on file. Current facility-administered medications:acetaminophen (TYLENOL) suppository 650 mg, 650 mg, Rectal, Q6H PRN, Eduard Clos, MD;  acetaminophen (TYLENOL) tablet 650 mg, 650 mg, Oral, Q6H PRN, Eduard Clos, MD;  albumin human 25 % solution 25 g, 25 g, Intravenous, Once, Stephani Police, PA, 25 g at 10/11/11 1546 feeding supplement (RESOURCE BREEZE) liquid 1 Container, 1 Container, Oral, TID BM, Stephani Police, PA, 1 Container at 10/11/11 1400;  furosemide (LASIX) tablet 20 mg, 20 mg, Oral, Daily, Eduard Clos, MD, 20 mg at 10/11/11 0934;  HYDROmorphone (DILAUDID) injection 0.5 mg, 0.5 mg, Intravenous, Q4H PRN, Eduard Clos, MD, 0.5 mg at 10/11/11 1311 HYDROmorphone (DILAUDID) injection 1  mg, 1 mg, Intravenous, Once, Sunnie Nielsen, MD, 1 mg at 10/10/11 2352;  HYDROmorphone (DILAUDID) injection 1 mg, 1 mg, Intravenous, Once, Sunnie Nielsen, MD, 1 mg at 10/11/11 0423;  lidocaine (XYLOCAINE) 1 % injection 5 mL, 5 mL, Other, Once, Sunnie Nielsen, MD, 5 mL at 10/11/11 0100;  ondansetron (ZOFRAN) injection 4 mg, 4 mg, Intravenous, Once, Sunnie Nielsen, MD, 4 mg at 10/10/11 2350 ondansetron [facility]) injection 4 mg, 4 mg, Intravenous, Once, Sunnie Nielsen, MD, 4 mg at 10/11/11 0149;  ondansetron (ZOFRAN) injection 4 mg, 4 mg, Intravenous, Q6H PRN, Eduard Clos, MD, 4 mg at 10/11/11 1611;  ondansetron (ZOFRAN) tablet 4 mg, 4 mg, Oral, Q6H PRN, Eduard Clos, MD, 4 mg at 10/11/11 0937;  pantoprazole (PROTONIX) EC tablet 40 mg, 40 mg, Oral, BID AC, Eduard Clos, MD, 40  mg at 10/11/11 1611 piperacillin-tazobactam (ZOSYN) IVPB 3.375 g, 3.375 g, Intravenous, Once, Sunnie Nielsen, MD, 3.375 g at 10/11/11 0054;  potassium chloride SA (K-DUR,KLOR-CON) CR tablet 20 mEq, 20 mEq, Oral, Once, Sunnie Nielsen, MD, 20 mEq at 10/11/11 0424;  potassium chloride SA (K-DUR,KLOR-CON) CR tablet 40 mEq, 40 mEq, Oral, Once, Stephani Police, PA, 40 mEq at 10/11/11 1545;  sodium chloride 0.9 % injection 3 mL, 3 mL, Intravenous, Q12H, Eduard Clos, MD spironolactone (ALDACTONE) tablet 100 mg, 100 mg, Oral, Daily, Maretta Bees, MD, 100 mg at 10/11/11 0934;  DISCONTD: 0.9 %  sodium chloride infusion, , Intravenous, Continuous, Sunnie Nielsen, MD, Last Rate: 125 mL/hr at 10/10/11 2350;  DISCONTD: cefoTAXime (CLAFORAN) 2 g in dextrose 5 % 50 mL IVPB, 2 g, Intravenous, Q8H, Eduard Clos, MD;  DISCONTD: sodium chloride 0.9 % injection 3 mL, 3 mL, Intravenous, Q12H, Eduard Clos, MD DISCONTD: spironolactone (ALDACTONE) tablet 50 mg, 50 mg, Oral, Daily, Eduard Clos, MD No Known Allergies   Objective:     BP 108/74  Pulse 96  Temp 97.7 F (36.5 C) (Oral)  Resp 18  Ht 5\' 1"  (1.549 m)  Wt 45.36 kg (100 lb)  BMI 18.89 kg/m2  SpO2 100%  She is alert and oriented and in no acute distress  Heart regular rhythm no murmurs  Lungs clear  Abdomen reveals normal bowel sounds, moderately distended with ascites, but not tender  Extremities no edema  Laboratory No components found with this basename: d1      Assessment:     #1 alcoholic cirrhosis of the liver  #2 ascites status post therapeutic paracentesis today      Plan:     I agree with the current management. I would recommend that she be discharged on Lasix 20 mg daily, Aldactone 100 mg daily, and a low-sodium diet. She can followup with Dr. Dulce Sellar in 2 days in the office. Lab Results  Component Value Date   HGB 12.0 10/10/2011   HGB 9.3* 09/25/2011   HGB 10.2* 09/24/2011   HCT 36.1 10/10/2011   HCT  27.1* 09/25/2011   HCT 30.1* 09/24/2011   ALKPHOS 478* 10/10/2011   ALKPHOS 283* 09/25/2011   ALKPHOS 247* 09/23/2011   AST 53* 10/10/2011   AST 38* 09/25/2011   AST 25 09/23/2011   ALT 11 10/10/2011   ALT <5 09/25/2011   ALT <5 09/23/2011   AMYLASE 43 07/26/2011   AMYLASE 149* 04/28/2008   AMYLASE 476* 04/27/2008

## 2011-10-11 NOTE — ED Notes (Signed)
MD at bedside. 

## 2011-10-11 NOTE — ED Notes (Signed)
Admitting MD at bedside, pt awaiting inpt beds assignment.  

## 2011-10-11 NOTE — Procedures (Signed)
US guided paracentesis  RLQ  3.1 liters- yellow fluid BP stable Pt tolerated well

## 2011-10-11 NOTE — ED Provider Notes (Signed)
History     CSN: [id number]  Arrival date & time 10/10/11  1845   First MD Initiated Contact with Patient 10/10/11 2254      Chief Complaint  Patient presents with  . Abdominal Pain    c/o bloated feeling and swelling with hx of cirrhosis of the liver and prior paracentisis    (Consider location/radiation/quality/duration/timing/severity/associated sxs/prior treatment) HPI History provided by patient. Diffuse abdominal pain worse over the last few days. Has developed associated abdominal distention with history of cirrhosis and ascites. Patient is having nausea and vomiting today unable to hold anything down. No diarrhea. No fevers or chills. No rashes. Was admitted last week for drainage of ascites. No known history of SBP. Pain is moderate in severity and diffuse. Sharp in quality. No hematuria. Has had some dark urine. No dysuria. Past Medical History  Diagnosis Date  . Hypertension   . Osteoporosis   . Alcoholic liver disease   . High cholesterol   . Peripheral vascular disease   . Shortness of breath 09/22/11    "because of all the fluid that's built up in my stomach"  . Ascites 09/22/11    "first time for me"  . Anemia   . History of blood transfusion 07/2011    "when I had elbow surgery"  . Arthritis     "all over"  . Dysrhythmia     "irregular"    Past Surgical History  Procedure Date  . Fracture surgery   . Amputation     two toes on left foot  . Bunionectomy     both feet  . Arthroscopic repair acl     right knee  . Joint replacement     right hip   . Orif humerus fracture 07/26/2011    Procedure: OPEN REDUCTION INTERNAL FIXATION (ORIF) DISTAL HUMERUS FRACTURE;  Surgeon: Budd Palmer, MD;  Location: [facility] OR;  Service: Orthopedics;  Laterality: Right;  . Total hip arthroplasty ~ 2003    right  . Tubal ligation 1980  . Paracentesis 09/22/11    2.2L  . Esophagogastroduodenoscopy 09/23/2011    Procedure: ESOPHAGOGASTRODUODENOSCOPY (EGD);  Surgeon: Shirley Friar, MD;  Location: [facility] ENDOSCOPY;  Service: Endoscopy;  Laterality: N/A;    No family history on file.  History  Substance Use Topics  . Smoking status: Never Smoker   . Smokeless tobacco: Never Used  . Alcohol Use: 3.6 oz/week    6 Shots of liquor per week     "last drink was day before OR in 07/2011"    OB History    Grav Para Term Preterm Abortions TAB SAB Ect Mult Living                  Review of Systems  Constitutional: Negative for fever and chills.  HENT: Negative for neck pain and neck stiffness.   Eyes: Negative for pain.  Respiratory: Negative for shortness of breath.   Cardiovascular: Negative for chest pain.  Gastrointestinal: Positive for nausea, vomiting, abdominal pain and abdominal distention.  Genitourinary: Negative for dysuria.  Musculoskeletal: Negative for back pain.  Skin: Negative for rash.  Neurological: Negative for headaches.  All other systems reviewed and are negative.    Allergies  Review of patient's allergies indicates no known allergies.  Home Medications   Current Outpatient Rx  Name Route Sig Dispense Refill  . CALCIUM + D PO Oral Take 1,600 mg by mouth daily.    . FUROSEMIDE 20 MG PO TABS Oral  Take 1 tablet (20 mg total) by mouth daily. 30 tablet 0  . HYDROCODONE-ACETAMINOPHEN 5-325 MG PO TABS Oral Take 1 tablet by mouth every 6 (six) hours as needed. For pain 30 tablet 0  . PANTOPRAZOLE SODIUM 40 MG PO TBEC Oral Take 1 tablet (40 mg total) by mouth 2 (two) times daily before a meal. 60 tablet 0  . SPIRONOLACTONE 50 MG PO TABS Oral Take 1 tablet (50 mg total) by mouth daily. 30 tablet 0  . UNABLE TO FIND  Bmet in 2 days 1 each 0    BP 133/88  Pulse 99  Temp 99.1 F (37.3 C) (Oral)  Resp 16  SpO2 96%  Physical Exam  Constitutional: She is oriented to person, place, and time. She appears well-developed and well-nourished.  HENT:  Head: Normocephalic and atraumatic.  Eyes: Conjunctivae and EOM are normal. Pupils are  equal, round, and reactive to light.  Neck: Trachea normal. Neck supple. No thyromegaly present.  Cardiovascular: Normal rate, regular rhythm, S1 normal, S2 normal and normal pulses.     No systolic murmur is present   No diastolic murmur is present  Pulses:      Radial pulses are 2+ on the right side, and 2+ on the left side.  Pulmonary/Chest: Effort normal and breath sounds normal. She has no wheezes. She has no rhonchi. She has no rales. She exhibits no tenderness.  Abdominal: Soft. Normal appearance and bowel sounds are normal. She exhibits distension. There is tenderness. There is no CVA tenderness and negative Murphy's sign.       Moderate distention and with fluid wave present. Diffuse abdominal tenderness.  Musculoskeletal:       BLE:s Calves nontender, no cords or erythema, negative Homans sign  Neurological: She is alert and oriented to person, place, and time. She has normal strength. No cranial nerve deficit or sensory deficit. GCS eye subscore is 4. GCS verbal subscore is 5. GCS motor subscore is 6.  Skin: Skin is warm and dry. No rash noted. She is not diaphoretic.  Psychiatric: Her speech is normal.       Cooperative and appropriate    ED Course  PARACENTESIS Date/Time: 10/11/2011 2:07 AM Performed by: Sunnie Nielsen Authorized by: Sunnie Nielsen Consent: Verbal consent obtained. Risks and benefits: risks, benefits and alternatives were discussed Consent given by: patient Patient understanding: patient states understanding of the procedure being performed Patient consent: the patient's understanding of the procedure matches consent given Procedure consent: procedure consent matches procedure scheduled Required items: required blood products, implants, devices, and special equipment available Patient identity confirmed: verbally with patient Time out: Immediately prior to procedure a "time out" was called to verify the correct patient, procedure, equipment, support staff and  site/side marked as required. Initial or subsequent exam: initial Procedure purpose: diagnostic Indications: abdominal discomfort secondary to ascites and suspected peritonitis Anesthesia: local infiltration Local anesthetic: lidocaine 1% without epinephrine Anesthetic total: 2 ml Preparation: Patient was prepped and draped in the usual sterile fashion. Needle gauge: 20 Ultrasound guidance: yes Puncture site: left lower quadrant Fluid removed: 10(ml) Fluid appearance: cloudy Dressing: 4x4 sterile gauze Patient tolerance: Patient tolerated the procedure well with no immediate complications.   (including critical care time)  Labs Reviewed  URINALYSIS, ROUTINE W REFLEX MICROSCOPIC - Abnormal; Notable for the following:    Leukocytes, UA TRACE (*)     All other components within normal limits  CBC - Abnormal; Notable for the following:    RBC 3.57 (*)  MCV 101.1 (*)     All other components within normal limits  COMPREHENSIVE METABOLIC PANEL - Abnormal; Notable for the following:    Potassium 3.3 (*)     Glucose, Bld 124 (*)     Albumin 2.3 (*)     AST 53 (*)     Alkaline Phosphatase 478 (*)     Total Bilirubin 1.7 (*)     All other components within normal limits  URINE MICROSCOPIC-ADD ON - Abnormal; Notable for the following:    Squamous Epithelial / LPF FEW (*)     All other components within normal limits  DIFFERENTIAL  BODY FLUID CULTURE  GRAM STAIN  BODY FLUID CELL COUNT WITH DIFFERENTIAL  LACTATE DEHYDROGENASE   IV Dilaudid. IV Zofran.. IV fluids.     MDM   Diffuse abdominal pain with ascites. Concern for possible SBP. Labs obtained and reviewed as above. UA reviewed as above. Paracentesis as above and medicine consult for admission. IV antibiotics initiated.   2:21 AM medicine consult. Case discussed as above with Dr. Toniann Fail, he requests Ct A/P without contrast and will admit.      Sunnie Nielsen, MD 10/11/11 Earle Gell

## 2011-10-12 DIAGNOSIS — R188 Other ascites: Secondary | ICD-10-CM

## 2011-10-12 DIAGNOSIS — K7030 Alcoholic cirrhosis of liver without ascites: Secondary | ICD-10-CM

## 2011-10-12 DIAGNOSIS — R112 Nausea with vomiting, unspecified: Secondary | ICD-10-CM

## 2011-10-12 DIAGNOSIS — R1084 Generalized abdominal pain: Secondary | ICD-10-CM

## 2011-10-12 LAB — COMPREHENSIVE METABOLIC PANEL
ALT: 7 U/L (ref 0–35)
CO2: 23 mEq/L (ref 19–32)
Calcium: 7.8 mg/dL — ABNORMAL LOW (ref 8.4–10.5)
Creatinine, Ser: 0.68 mg/dL (ref 0.50–1.10)
GFR calc Af Amer: >90 mL/min (ref >90)
GFR calc non Af Amer: >90 mL/min (ref >90)
Glucose, Bld: 89 mg/dL (ref 70–99)
Sodium: 133 mEq/L — ABNORMAL LOW (ref 135–145)
Total Protein: 5.2 g/dL — ABNORMAL LOW (ref 6.0–8.3)

## 2011-10-12 LAB — CBC
MCV: 100.7 fL — ABNORMAL HIGH (ref 78.0–100.0)
Platelets: 117 10*3/uL — ABNORMAL LOW (ref 150–400)
RBC: 2.92 MIL/uL — ABNORMAL LOW (ref 3.87–5.11)
RDW: 13.6 % (ref 11.5–15.5)
WBC: 4.9 10*3/uL (ref 4.0–10.5)

## 2011-10-12 LAB — DIFFERENTIAL
Basophils Absolute: 0 10*3/uL (ref 0.0–0.1)
Eosinophils Relative: 1 % (ref 0–5)
Lymphocytes Relative: 32 % (ref 12–46)
Lymphs Abs: 1.6 10*3/uL (ref 0.7–4.0)
Neutro Abs: 2.7 10*3/uL (ref 1.7–7.7)
Neutrophils Relative %: 56 % (ref 43–77)

## 2011-10-12 LAB — AMMONIA: Ammonia: 56 umol/L (ref 11–60)

## 2011-10-12 MED ORDER — SPIRONOLACTONE 100 MG PO TABS: 100.0000 mg | ORAL_TABLET | Freq: Every day | ORAL | Status: DC

## 2011-10-12 MED ORDER — ONDANSETRON HCL 4 MG PO TABS: 4.0000 mg | ORAL_TABLET | Freq: Four times a day (QID) | ORAL | Status: AC | PRN

## 2011-10-12 MED ORDER — BOOST / RESOURCE BREEZE PO LIQD: 1.0000 | Freq: Three times a day (TID) | ORAL | Status: DC

## 2011-10-12 MED ORDER — OXYCODONE HCL 5 MG PO TABS
5.0000 mg | ORAL_TABLET | ORAL | Status: DC | PRN
  Administered 2011-10-12 (×3): 5 mg via ORAL
  Filled 2011-10-12 (×3): qty 1

## 2011-10-12 MED ORDER — POTASSIUM CHLORIDE CRYS ER 20 MEQ PO TBCR
40.0000 meq | EXTENDED_RELEASE_TABLET | Freq: Once | ORAL | Status: AC
  Administered 2011-10-12: 40 meq via ORAL
  Filled 2011-10-12: qty 2

## 2011-10-12 NOTE — Clinical Documentation Improvement (Signed)
MALNUTRITION DOCUMENTATION CLARIFICATION  THIS DOCUMENT IS NOT A PERMANENT PART OF THE MEDICAL RECORD  TO RESPOND TO THE THIS QUERY, FOLLOW THE INSTRUCTIONS BELOW:  1. If needed, update documentation for the patient's encounter via the notes activity.  2. Access this query again and click edit on the In [employer].  3. After updating, or not, click F2 to complete all highlighted (required) fields concerning your review. Select "additional documentation in the medical record" OR "no additional documentation provided".  4. Click Sign note button.  5. The deficiency will fall out of your In Basket *Please let us know if you are not able to complete this workflow by phone or e-mail (listed below).  Please update your documentation within the medical record to reflect your response to this query.                                                                                        10/12/11   Dear [facility],  In a better effort to capture your patient's severity of illness, reflect appropriate length of stay and utilization of resources, a review of the patient medical record has revealed the following indicators.    Based on your clinical judgment, please clarify and document in a progress note and/or discharge summary the clinical condition associated with the following supporting information:  In responding to this query please exercise your independent judgment.  The fact that a query is asked, does not imply that any particular answer is desired or expected.   Patient has BMI 18.89 and   meeting criteria for "severe malnutrition"  per nutritional consult.  If agree with this secondary diagnosis please document in progress notes/ discharge summary, if not please clarify a more appropriate diagnosis pertaining to nutritional status.  Thank you    Possible Clinical Conditions?  Mild Malnutrition   Moderate Malnutrition  Severe Malnutrition    Protein Calorie  Malnutrition  Severe Protein Calorie Malnutrition  Cachexia    Underweight  Other Condition  Cannot clinically determine      Risk Factors: Decompensating Alcoholic Cirrhosis, ascites   Signs & Symptoms: 10 lb wt loss since last admit (15 days ago)  Ht 5" 1"     Wt 100  BMI:  18.89  Weight  Loss 10 bs over 15 days     Albumin level:   2.3 (6/24   Total Protein:  6.9 ( 6/24     Calcium level:  8.7 (6/24   Supplements : Resource Breeze TID   Nutrition Consult:  Criteria for " -Severe malnutrition in the context of acute illness or injury "    You may use possible, probable, or suspect with inpatient documentation. possible, probable, suspected diagnoses MUST be documented at the time of discharge  Reviewed:no response from MD pt discharged 4 days ago will let coder f/up  Thank You,  Lavonda Jumbo  Clinical Documentation Specialist RN, BSN:  Pager [phone]  Health Information Management [place]

## 2011-10-12 NOTE — Progress Notes (Signed)
[name] to be D/C'd Home per MD order.  Discussed with the patient and all questions fully answered.   Rakisha, Pincock  Home Medication Instructions [id number]   Printed on:10/12/11 1839  Medication Information                    Calcium Carbonate-Vitamin D (CALCIUM + D PO) Take 1,600 mg by mouth daily.           furosemide (LASIX) 20 MG tablet Take 1 tablet (20 mg total) by mouth daily.           HYDROcodone-acetaminophen (NORCO) 5-325 MG per tablet Take 1 tablet by mouth every 6 (six) hours as needed. For pain           pantoprazole (PROTONIX) 40 MG tablet Take 1 tablet (40 mg total) by mouth 2 (two) times daily before a meal.           feeding supplement (RESOURCE BREEZE) LIQD Take 1 Container by mouth 3 (three) times daily between meals.           ondansetron (ZOFRAN) 4 MG tablet Take 1 tablet (4 mg total) by mouth every 6 (six) hours as needed for nausea.           spironolactone (ALDACTONE) 100 MG tablet Take 1 tablet (100 mg total) by mouth daily.             VVS, Skin clean, dry and intact without evidence of skin break down, no evidence of skin tears noted. IV catheter discontinued intact. Site without signs and symptoms of complications. Dressing and pressure applied.  An After Visit Summary was printed and given to the patient. Patient escorted via WC, and D/C home via private auto.  Kennyth Arnold D 10/12/2011 6:39 PM

## 2011-10-12 NOTE — Discharge Summary (Signed)
Patient ID: Kathryn [name] MRN: [id number] DOB/AGE: [birth year] 61 y.o.  Admit date: 10/10/2011 Discharge date: 10/12/2011  Primary Care Physician:  Willow Ora, MD  Discharge Diagnoses:    Present on Admission:  Principal Problem: * Vomiting, unable to tolerate POs.  *Ascites Active Problems:  Alcoholic liver disease   Medication List  As of 10/12/2011  2:05 PM   STOP taking these medications         UNABLE TO FIND         TAKE these medications         CALCIUM + D PO   Take 1,600 mg by mouth daily.      feeding supplement Liqd   Take 1 Container by mouth 3 (three) times daily between meals.      furosemide 20 MG tablet   Commonly known as: LASIX   Take 1 tablet (20 mg total) by mouth daily.      HYDROcodone-acetaminophen 5-325 MG per tablet   Commonly known as: NORCO   Take 1 tablet by mouth every 6 (six) hours as needed. For pain      ondansetron 4 MG tablet   Commonly known as: ZOFRAN   Take 1 tablet (4 mg total) by mouth every 6 (six) hours as needed for nausea.      pantoprazole 40 MG tablet   Commonly known as: PROTONIX   Take 1 tablet (40 mg total) by mouth 2 (two) times daily before a meal.      spironolactone 100 MG tablet   Commonly known as: ALDACTONE   Take 1 tablet (100 mg total) by mouth daily.            Consults:  [facility], Dr. Luan Moore  From the admission note:  Chief Complaint: Abdominal pain.  HPI: 61 year-old female with recent diagnosis of alcoholic liver cirrhosis and was just recently discharged 2 weeks ago after patient had ascites and had paracentesis twice then presents with complaints of abdominal distention pain and nausea and vomiting. Patient states that since her discharge last she was feeling better and was taking her medications as advised. But since yesterday morning she started developing diffuse abdominal pain and worsening of her distention and had at least twice vomiting. The pain is diffuse. Denies any fever  chills or diarrhea. In the ER patient was found to have distended abdomen compatible with ascites and had diagnostic paracentesis done by ER physician. Since patient is still having pain will be admitted for further management. Patient denies any shortness of breath or chest pain.  1.  Abdominal pain with concern for SBP.  The patient was admitted because of concern for SBP.  Diagnostic tap done in the ED ruled out SBP.    2.  Vomiting.  Uncertain etiology.   Possibly viral illness.  Patient has been treated symptomatically with Zofran and a clear liquid diet. EGD was done during previous hospitalization (6/7) and showed esophagitis. She has been maintained on protonix.  On 6/25 she vomited everything she consumed.  On 6/26 she tolerated clear liquids, but did not want to be discharged until she could tolerate solid food.  On the evening of 6/26 she was given a soft diet and was able to tolerate it.  She will be discharged to home with instructions to slowly advance her diet, take Zofran before meals as needed, and follow up with gastroenterology.  3.  Uncontrolled Ascites.  [name] reports that her abdominal ascites only recently started to  really accumulate (2 weeks ago).  She had been tapped twice prior to admission, and received a 3rd paracentesis during this admission (3 liters of fluid was drawn off).  She felt much better after paracentesis.  We discussed the draw backs of paracentesis and the need to limit it.  She was on low dose diuretics on admissions.  These were increased to Spironolactone 100 and lasix 20.  She will likely benefit from continuing to titrate these up.  She has been educated about a low salt diet and the need to weigh herself regularly.  She will follow up with [facility] as an outpatient.  We appreciate Dr. Luan Moore support in the hospital.    4.  Hypokalemia.  Likely due to diuretic therapy.  Repleted orally and will continue with oral supplimentation on discharge.  She is  scheduled for a CMET at [facility] on Monday 7/1 at 1:30.  Physical Exam on Discharge:  General: A&O, NAD, appears weathered. Slightly anxious Cardiovascular: RRR, no M/R/G  Respiratory: CTA no W/C/R  Abdomen: Significantly distended, not tight. Tender to palpation bilaterally in lower quadrants. + bowel sounds  Extremities: No swelling. Right Arm with well healed scar from recent fracture. Left foot with chronic swelling and toe amputation. Psych:  Slightly anxious.  A&O, well groomed, cooperative.   Filed Vitals:   10/11/11 1516 10/11/11 1538 10/11/11 2235 10/12/11 0716  BP: 103/76 108/74 99/66 96/67   Pulse:  96 97 97  Temp:  97.7 F (36.5 C) 98.6 F (37 C) 99.1 F (37.3 C)  TempSrc:  Oral Oral Oral  Resp:  18 12 16   Height:      Weight:    45.1 kg (99 lb 6.8 oz)  SpO2:  100% 97% 97%     Intake/Output Summary (Last 24 hours) at 10/12/11 1405 Last data filed at 10/12/11 0747  Gross per 24 hour  Intake      0 ml  Output    800 ml  Net   -800 ml    Basic Metabolic Panel:  Lab 10/12/11 1610 10/10/11 1952  NA 133* 139  K 3.2* 3.3*  CL 101 101  CO2 23 24  GLUCOSE 89 124*  BUN 8 7  CREATININE 0.68 0.67  CALCIUM 7.8* 8.7  MG -- --  PHOS -- --   Liver Function Tests:  Lab 10/12/11 0500 10/10/11 1952  AST 34 53*  ALT 7 11  ALKPHOS 266* 478*  BILITOT 3.2* 1.7*  PROT 5.2* 6.9  ALBUMIN 2.0* 2.3*    Lab 10/11/11 0630  LIPASE 14  AMYLASE --    Lab 10/12/11 0500  AMMONIA 56   CBC:  Lab 10/12/11 0500 10/10/11 1952  WBC 4.9 10.4  NEUTROABS 2.7 7.0  HGB 9.8* 12.0  HCT 29.4* 36.1  MCV 100.7* 101.1*  PLT 117* 242   Coagulation:  Lab 10/11/11 0630  LABPROT 16.7*  INR 1.33    Results for Kathryn [name] (MRN [id number]) as of 10/12/2011 14:06  Ref. Range 10/10/2011 19:26  Color, Urine Latest Range: YELLOW  YELLOW  APPearance Latest Range: CLEAR  CLEAR  Specific Gravity, Urine Latest Range: 1.005-1.030  1.009  pH Latest Range: 5.0-8.0  6.0  Glucose, UA  Latest Range: NEGATIVE mg/dL NEGATIVE  Bilirubin Urine Latest Range: NEGATIVE  NEGATIVE  Ketones, ur Latest Range: NEGATIVE mg/dL NEGATIVE  Protein Latest Range: NEGATIVE mg/dL NEGATIVE  Urobilinogen, UA Latest Range: 0.0-1.0 mg/dL 0.2  Nitrite Latest Range: NEGATIVE  NEGATIVE  Leukocytes, UA Latest  Range: NEGATIVE  TRACE (A)  Hgb urine dipstick Latest Range: NEGATIVE  NEGATIVE  WBC, UA Latest Range: <3 WBC/hpf 0-2  Squamous Epithelial / LPF Latest Range: RARE  FEW (A)      Micro Results:  10/11/2011:  FLUID PERITONEAL CAVITY: Gram Stain FEW WBC PRESENT,BOTH PMN AND MONONUCLEAR NO ORGANISMS SEEN    Other Pertinent Lab Results:    Results for Isis, Costanza Aveyah [name] (MRN [id number]) as of 10/12/2011 14:06  Ref. Range 10/11/2011 02:05  Fluid Type-FCT No range found PERITONEAL CAVITY  Color, Fluid Latest Range: YELLOW  ORANGE (A)  WBC, Fluid Latest Range: 0-1000 cu mm 156  Lymphs, Fluid No range found 77  Appearance, Fluid Latest Range: CLEAR  TURBID (A)  Other Cells, Fluid No range found OTHER CELLS IDENTIFIED AS MESOTHELIAL CELLS  Neutrophil Count, Fluid Latest Range: 0-25 % 18  Monocyte-Macrophage-Serous Fluid Latest Range: 50-90 % 5 (L)    Significant Diagnostic Studies:  Ct Abdomen Pelvis Wo Contrast  10/11/2011  *RADIOLOGY REPORT*  Clinical Data: Abdominal distension and loss of appetite. Vomiting.  CT ABDOMEN AND PELVIS WITHOUT CONTRAST  Technique:  Multidetector CT imaging of the abdomen and pelvis was performed following the standard protocol without intravenous contrast.  Comparison: 09/22/2011  Findings: Small bilateral pleural effusions with basilar atelectasis in the lungs.  Moderate sized esophageal hiatal hernia with fluid in the lower esophagus suggesting reflux.  Changes of hepatic cirrhosis with enlarged lateral segment left lobe and atrophy of the right lobe.  Geographic hypo attenuation in the left lobe of the liver is similar to previous study and probably represents focal  fatty infiltration.  Spleen size is normal.  Increased density within the gallbladder consistent with sludge.  There is moderate free fluid throughout the abdomen and pelvis.  Density measurements are consistent with ascites.  The amount of ascites appears relatively stable since the previous study.  Small accessory spleen.  The pancreas, adrenal glands, abdominal aorta, and retroperitoneal lymph nodes are unremarkable. There is a stone in the midportion of the left kidney measuring about 4 mm diameter.  No hydronephrosis in either kidney.  No free air in the abdomen.  Pelvis:  Postoperative changes in the right hip with right hip arthroplasty causing streak artifact.  The uterus and adnexal structures are not enlarged.  The uterus does demonstrate a nodular contour suggesting fibroids.  The appendix is not visualized.  No evidence of diverticulitis.  Degenerative changes throughout the lumbar spine.  IMPRESSION: Changes of hepatic cirrhosis with diffuse abdominal and pelvic ascites similar to previous study.  Gallbladder sludge.  Esophageal hiatal hernia with presumed reflux into the distal esophagus. Small bilateral pleural effusions with basilar atelectasis.  Original Report Authenticated By: Marlon Pel, M.D.    US Paracentesis  10/11/2011  *RADIOLOGY REPORT*  Clinical Data: Abdominal ascites  ULTRASOUND GUIDED PARACENTESIS  Comparison:  Previous paracentesis  An ultrasound guided paracentesis was thoroughly discussed with the patient and questions answered.  The benefits, risks, alternatives and complications were also discussed.  The patient understands and wishes to proceed with the procedure.  Written consent was obtained.  Ultrasound was performed to localize and mark an adequate pocket of fluid in the right lower quadrant of the abdomen.  The area was then prepped and draped in the normal sterile fashion.  1% Lidocaine was used for local anesthesia.  Under ultrasound guidance a 19 gauge Yueh  catheter was introduced.  Paracentesis was performed.  The catheter was removed and a dressing applied.  Complications:  None  Findings:  A total of approximately 3.1 liters of yellow fluid was removed.  A fluid sample was not sent for laboratory analysis.  IMPRESSION: Successful ultrasound guided paracentesis yielding 3.1 liters of ascites.  Read by: Ralene Muskrat, P.A.-C  Original Report Authenticated By: Donavan Burnet, M.D.   US Paracentesis  09/26/2011  *RADIOLOGY REPORT*  Clinical Data: Alcoholic cirrhosis, recurrent abdominal ascites  ULTRASOUND GUIDED PARACENTESIS  Comparison:  Previous paracentesis  An ultrasound guided paracentesis was thoroughly discussed with the patient and questions answered.  The benefits, risks, alternatives and complications were also discussed.  The patient understands and wishes to proceed with the procedure.  Written consent was obtained.  Ultrasound was performed to localize and mark an adequate pocket of fluid in the left lower quadrant of the abdomen.  The area was then prepped and draped in the normal sterile fashion.  1% Lidocaine was used for local anesthesia.  Under ultrasound guidance a 19 gauge Yueh catheter was introduced.  Paracentesis was performed.  The catheter was removed and a dressing applied.  Complications:  None  Findings:  A total of approximately 3.2 liters of clear yellow fluid was removed.  A fluid sample was not sent for laboratory analysis.  IMPRESSION: Successful ultrasound guided paracentesis yielding 3.2 liters of ascites.  Read by Brayton El PA-C  Original Report Authenticated By: Waynard Reeds, M.D.   US Paracentesis  09/23/2011  *RADIOLOGY REPORT*  Clinical Data: Abdominal ascites  ULTRASOUND GUIDED PARACENTESIS  Comparison:  None  An ultrasound guided paracentesis was thoroughly discussed with the patient and questions answered.  The benefits, risks, alternatives and complications were also discussed.  The patient understands and wishes  to proceed with the procedure.  Written consent was obtained.  Ultrasound was performed to localize and mark an adequate pocket of fluid in the right lower quadrant of the abdomen.  The area was then prepped and draped in the normal sterile fashion.  1% Lidocaine was used for local anesthesia.  Under ultrasound guidance a 19 gauge Yueh catheter was introduced.  Paracentesis was performed.  The catheter was removed and a dressing applied.  Complications:  None  Findings:  A total of approximately 2.2 liters of yellow fluid was removed.  A fluid sample was sent for laboratory analysis.  IMPRESSION: Successful ultrasound guided paracentesis yielding 2.2 liters of ascites.  Read by: Ralene Muskrat, P.A.-C  Original Report Authenticated By: Reola Calkins, M.D.      Disposition and Follow-up: Stable for discharge to home in the care of her husband with GI follow up.   Follow-up Information    Follow up with Freddy Jaksch, MD. (Go Monday 7/1 for a LAB appointment at 1:30 to draw a CMET)    Contact information:   [address] [place] [address] [phone]       Please follow up. (Dr. Hulen Shouts office will call you with an appointment for next [facility])           Time spent on Discharge: 40 min.  SignedKindra, [place] 10/12/2011, 2:05 PM [phone]

## 2011-10-14 LAB — BODY FLUID CULTURE

## 2011-10-15 NOTE — Discharge Summary (Signed)
-  Have reviewed the data and plan as above and agreed. -Possible viral illness causing her Zofran continue symptomatic treatment. Followup with Dr. Dulce Sellar is an outpatient. -Will need a basic metabolic panel as we have increased Lasix as for a lactose which will be checked by GI.

## 2011-10-21 DIAGNOSIS — E43 Unspecified severe protein-calorie malnutrition: Secondary | ICD-10-CM | POA: Insufficient documentation

## 2011-11-10 ENCOUNTER — Other Ambulatory Visit (HOSPITAL_COMMUNITY): Payer: Self-pay | Admitting: Gastroenterology

## 2011-11-10 DIAGNOSIS — K7460 Unspecified cirrhosis of liver: Secondary | ICD-10-CM

## 2011-11-11 ENCOUNTER — Ambulatory Visit (HOSPITAL_COMMUNITY)
Admission: RE | Admit: 2011-11-11 | Discharge: 2011-11-11 | Disposition: A | Discharge status: Home / Self Care | Payer: Medicare Other | Source: Ambulatory Visit | Attending: Gastroenterology | Admitting: Gastroenterology

## 2011-11-11 DIAGNOSIS — K7460 Unspecified cirrhosis of liver: Secondary | ICD-10-CM

## 2011-11-11 DIAGNOSIS — R188 Other ascites: Secondary | ICD-10-CM | POA: Insufficient documentation

## 2011-11-11 MED ORDER — ALBUMIN HUMAN 25 % IV SOLN
25.0000 g | Freq: Once | INTRAVENOUS | Status: AC
  Administered 2011-11-11: 25 g via INTRAVENOUS
  Filled 2011-11-11: qty 100

## 2011-11-11 NOTE — Progress Notes (Signed)
Paracentesis drain and  Albumin infusions are completed.  Patient states she feels better and vital signs are stable.

## 2011-11-19 ENCOUNTER — Other Ambulatory Visit (HOSPITAL_COMMUNITY): Payer: Self-pay | Admitting: Internal Medicine

## 2011-12-07 ENCOUNTER — Other Ambulatory Visit (HOSPITAL_COMMUNITY): Payer: Self-pay | Admitting: Gastroenterology

## 2011-12-07 DIAGNOSIS — K7460 Unspecified cirrhosis of liver: Secondary | ICD-10-CM

## 2011-12-08 ENCOUNTER — Ambulatory Visit (HOSPITAL_COMMUNITY)
Admission: RE | Admit: 2011-12-08 | Discharge: 2011-12-08 | Disposition: A | Discharge status: Home / Self Care | Payer: Medicare Other | Source: Ambulatory Visit | Attending: Gastroenterology | Admitting: Gastroenterology

## 2011-12-08 DIAGNOSIS — K7460 Unspecified cirrhosis of liver: Secondary | ICD-10-CM

## 2011-12-08 DIAGNOSIS — R188 Other ascites: Secondary | ICD-10-CM | POA: Insufficient documentation

## 2011-12-08 MED ORDER — ALBUMIN HUMAN 25 % IV SOLN
25.0000 g | INTRAVENOUS | Status: AC
  Administered 2011-12-08: 25 g via INTRAVENOUS
  Filled 2011-12-08: qty 100

## 2011-12-08 NOTE — Procedures (Signed)
Successful US guided paracentesis from RLQ.  Yielded 3.6L of clear yellow fluid.  The patient received 25g of albumin prior to procedure. Procedure was terminated early due to blood pressure trend into the 70's    Specimen was not sent for labs.  Brayton El PA-C 12/08/2011 3:25 PM

## 2011-12-12 ENCOUNTER — Other Ambulatory Visit (HOSPITAL_COMMUNITY): Payer: Self-pay | Admitting: Gastroenterology

## 2011-12-12 DIAGNOSIS — K7030 Alcoholic cirrhosis of liver without ascites: Secondary | ICD-10-CM

## 2011-12-12 DIAGNOSIS — R109 Unspecified abdominal pain: Secondary | ICD-10-CM

## 2011-12-15 ENCOUNTER — Ambulatory Visit (HOSPITAL_COMMUNITY)
Admission: RE | Admit: 2011-12-15 | Discharge: 2011-12-15 | Disposition: A | Discharge status: Home / Self Care | Payer: Medicare Other | Source: Ambulatory Visit | Attending: Gastroenterology | Admitting: Gastroenterology

## 2011-12-15 DIAGNOSIS — R188 Other ascites: Secondary | ICD-10-CM | POA: Insufficient documentation

## 2011-12-15 DIAGNOSIS — K7030 Alcoholic cirrhosis of liver without ascites: Secondary | ICD-10-CM | POA: Insufficient documentation

## 2011-12-15 DIAGNOSIS — R109 Unspecified abdominal pain: Secondary | ICD-10-CM

## 2011-12-15 MED ORDER — ALBUMIN HUMAN 25 % IV SOLN
25.0000 g | Freq: Once | INTRAVENOUS | Status: AC
  Administered 2011-12-15: 25 g via INTRAVENOUS
  Filled 2011-12-15: qty 100

## 2011-12-15 NOTE — Procedures (Signed)
Procedure : paracentesis following albumin infusion Specimen :  5.8    L yellow serous fluid complicaitons : none immediate  Pt tolerated well.

## 2012-01-02 ENCOUNTER — Encounter (HOSPITAL_COMMUNITY): Payer: Self-pay

## 2012-01-02 ENCOUNTER — Inpatient Hospital Stay (HOSPITAL_COMMUNITY)
Admission: EM | Admit: 2012-01-02 | Discharge: 2012-01-12 | DRG: 871 | Disposition: A | Discharge status: Home Health | Payer: Medicare Other | Attending: Internal Medicine | Admitting: Internal Medicine

## 2012-01-02 ENCOUNTER — Emergency Department (HOSPITAL_COMMUNITY): Payer: Medicare Other

## 2012-01-02 ENCOUNTER — Inpatient Hospital Stay (HOSPITAL_COMMUNITY): Payer: Medicare Other

## 2012-01-02 DIAGNOSIS — R9431 Abnormal electrocardiogram [ECG] [EKG]: Secondary | ICD-10-CM | POA: Diagnosis present

## 2012-01-02 DIAGNOSIS — E873 Alkalosis: Secondary | ICD-10-CM | POA: Diagnosis present

## 2012-01-02 DIAGNOSIS — K859 Acute pancreatitis without necrosis or infection, unspecified: Secondary | ICD-10-CM

## 2012-01-02 DIAGNOSIS — Z96649 Presence of unspecified artificial hip joint: Secondary | ICD-10-CM

## 2012-01-02 DIAGNOSIS — I219 Acute myocardial infarction, unspecified: Secondary | ICD-10-CM | POA: Diagnosis not present

## 2012-01-02 DIAGNOSIS — W19XXXA Unspecified fall, initial encounter: Secondary | ICD-10-CM | POA: Diagnosis present

## 2012-01-02 DIAGNOSIS — K209 Esophagitis, unspecified without bleeding: Secondary | ICD-10-CM | POA: Diagnosis present

## 2012-01-02 DIAGNOSIS — E2749 Other adrenocortical insufficiency: Secondary | ICD-10-CM | POA: Diagnosis present

## 2012-01-02 DIAGNOSIS — E7800 Pure hypercholesterolemia, unspecified: Secondary | ICD-10-CM | POA: Diagnosis present

## 2012-01-02 DIAGNOSIS — R6520 Severe sepsis without septic shock: Secondary | ICD-10-CM | POA: Diagnosis present

## 2012-01-02 DIAGNOSIS — R578 Other shock: Secondary | ICD-10-CM

## 2012-01-02 DIAGNOSIS — E872 Acidosis, unspecified: Secondary | ICD-10-CM | POA: Diagnosis present

## 2012-01-02 DIAGNOSIS — G92 Toxic encephalopathy: Secondary | ICD-10-CM | POA: Diagnosis present

## 2012-01-02 DIAGNOSIS — N170 Acute kidney failure with tubular necrosis: Secondary | ICD-10-CM | POA: Diagnosis present

## 2012-01-02 DIAGNOSIS — I5032 Chronic diastolic (congestive) heart failure: Secondary | ICD-10-CM | POA: Diagnosis present

## 2012-01-02 DIAGNOSIS — E876 Hypokalemia: Secondary | ICD-10-CM | POA: Diagnosis present

## 2012-01-02 DIAGNOSIS — K7030 Alcoholic cirrhosis of liver without ascites: Secondary | ICD-10-CM | POA: Diagnosis present

## 2012-01-02 DIAGNOSIS — D62 Acute posthemorrhagic anemia: Secondary | ICD-10-CM | POA: Diagnosis present

## 2012-01-02 DIAGNOSIS — I10 Essential (primary) hypertension: Secondary | ICD-10-CM | POA: Diagnosis present

## 2012-01-02 DIAGNOSIS — J9600 Acute respiratory failure, unspecified whether with hypoxia or hypercapnia: Secondary | ICD-10-CM

## 2012-01-02 DIAGNOSIS — I214 Non-ST elevation (NSTEMI) myocardial infarction: Secondary | ICD-10-CM | POA: Diagnosis present

## 2012-01-02 DIAGNOSIS — Z79899 Other long term (current) drug therapy: Secondary | ICD-10-CM

## 2012-01-02 DIAGNOSIS — R64 Cachexia: Secondary | ICD-10-CM | POA: Diagnosis present

## 2012-01-02 DIAGNOSIS — N179 Acute kidney failure, unspecified: Secondary | ICD-10-CM | POA: Diagnosis present

## 2012-01-02 DIAGNOSIS — M810 Age-related osteoporosis without current pathological fracture: Secondary | ICD-10-CM | POA: Diagnosis present

## 2012-01-02 DIAGNOSIS — I472 Ventricular tachycardia, unspecified: Secondary | ICD-10-CM | POA: Diagnosis not present

## 2012-01-02 DIAGNOSIS — E43 Unspecified severe protein-calorie malnutrition: Secondary | ICD-10-CM

## 2012-01-02 DIAGNOSIS — G929 Unspecified toxic encephalopathy: Secondary | ICD-10-CM | POA: Diagnosis present

## 2012-01-02 DIAGNOSIS — R5381 Other malaise: Secondary | ICD-10-CM | POA: Diagnosis not present

## 2012-01-02 DIAGNOSIS — R188 Other ascites: Secondary | ICD-10-CM | POA: Diagnosis present

## 2012-01-02 DIAGNOSIS — K920 Hematemesis: Secondary | ICD-10-CM | POA: Diagnosis present

## 2012-01-02 DIAGNOSIS — I509 Heart failure, unspecified: Secondary | ICD-10-CM | POA: Diagnosis present

## 2012-01-02 DIAGNOSIS — E785 Hyperlipidemia, unspecified: Secondary | ICD-10-CM | POA: Diagnosis present

## 2012-01-02 DIAGNOSIS — K709 Alcoholic liver disease, unspecified: Secondary | ICD-10-CM | POA: Diagnosis present

## 2012-01-02 DIAGNOSIS — F1020 Alcohol dependence, uncomplicated: Secondary | ICD-10-CM | POA: Diagnosis present

## 2012-01-02 DIAGNOSIS — D6959 Other secondary thrombocytopenia: Secondary | ICD-10-CM | POA: Diagnosis not present

## 2012-01-02 DIAGNOSIS — T380X5A Adverse effect of glucocorticoids and synthetic analogues, initial encounter: Secondary | ICD-10-CM | POA: Diagnosis not present

## 2012-01-02 DIAGNOSIS — M129 Arthropathy, unspecified: Secondary | ICD-10-CM | POA: Diagnosis present

## 2012-01-02 DIAGNOSIS — D72829 Elevated white blood cell count, unspecified: Secondary | ICD-10-CM | POA: Diagnosis present

## 2012-01-02 DIAGNOSIS — I739 Peripheral vascular disease, unspecified: Secondary | ICD-10-CM | POA: Diagnosis present

## 2012-01-02 DIAGNOSIS — A419 Sepsis, unspecified organism: Principal | ICD-10-CM | POA: Diagnosis present

## 2012-01-02 DIAGNOSIS — R7309 Other abnormal glucose: Secondary | ICD-10-CM | POA: Diagnosis not present

## 2012-01-02 DIAGNOSIS — S98139A Complete traumatic amputation of one unspecified lesser toe, initial encounter: Secondary | ICD-10-CM

## 2012-01-02 DIAGNOSIS — R6521 Severe sepsis with septic shock: Secondary | ICD-10-CM | POA: Diagnosis present

## 2012-01-02 DIAGNOSIS — I4729 Other ventricular tachycardia: Secondary | ICD-10-CM | POA: Diagnosis not present

## 2012-01-02 DIAGNOSIS — K219 Gastro-esophageal reflux disease without esophagitis: Secondary | ICD-10-CM

## 2012-01-02 LAB — CBC
HCT: 34 % — ABNORMAL LOW (ref 36.0–46.0)
Hemoglobin: 11.7 g/dL — ABNORMAL LOW (ref 12.0–15.0)
MCH: 32.6 pg (ref 26.0–34.0)
MCHC: 34.4 g/dL (ref 30.0–36.0)
MCV: 94.7 fL (ref 78.0–100.0)
RDW: 16 % — ABNORMAL HIGH (ref 11.5–15.5)

## 2012-01-02 LAB — CBC WITH DIFFERENTIAL/PLATELET
Basophils Relative: 0 % (ref 0–1)
Eosinophils Absolute: 0 10*3/uL (ref 0.0–0.7)
Eosinophils Relative: 0 % (ref 0–5)
MCH: 34.8 pg — ABNORMAL HIGH (ref 26.0–34.0)
MCHC: 34.5 g/dL (ref 30.0–36.0)
Monocytes Relative: 6 % (ref 3–12)
Neutrophils Relative %: 90 % — ABNORMAL HIGH (ref 43–77)
Platelets: 288 10*3/uL (ref 150–400)

## 2012-01-02 LAB — PROTIME-INR: INR: 1.19 (ref 0.00–1.49)

## 2012-01-02 LAB — POCT I-STAT, CHEM 8
BUN: 24 mg/dL — ABNORMAL HIGH (ref 6–23)
Chloride: 104 mEq/L (ref 96–112)
Chloride: 106 mEq/L (ref 96–112)
Creatinine, Ser: 1.5 mg/dL — ABNORMAL HIGH (ref 0.50–1.10)
HCT: 18 % — ABNORMAL LOW (ref 36.0–46.0)
Hemoglobin: 6.1 g/dL — CL (ref 12.0–15.0)
Potassium: 4 mEq/L (ref 3.5–5.1)
Sodium: 137 mEq/L (ref 135–145)
Sodium: 139 mEq/L (ref 135–145)

## 2012-01-02 LAB — URINALYSIS, ROUTINE W REFLEX MICROSCOPIC
Glucose, UA: NEGATIVE mg/dL
Ketones, ur: NEGATIVE mg/dL
Leukocytes, UA: NEGATIVE
Nitrite: NEGATIVE
Specific Gravity, Urine: 1.02 (ref 1.005–1.030)
pH: 5.5 (ref 5.0–8.0)

## 2012-01-02 LAB — COMPREHENSIVE METABOLIC PANEL
Albumin: 1.7 g/dL — ABNORMAL LOW (ref 3.5–5.2)
Alkaline Phosphatase: 248 U/L — ABNORMAL HIGH (ref 39–117)
BUN: 30 mg/dL — ABNORMAL HIGH (ref 6–23)
Calcium: 11.4 mg/dL — ABNORMAL HIGH (ref 8.4–10.5)
Potassium: 3.8 mEq/L (ref 3.5–5.1)
Sodium: 135 mEq/L (ref 135–145)
Total Protein: 5.9 g/dL — ABNORMAL LOW (ref 6.0–8.3)

## 2012-01-02 LAB — OCCULT BLOOD GASTRIC / DUODENUM (SPECIMEN CUP)

## 2012-01-02 LAB — POCT I-STAT 3, ART BLOOD GAS (G3+)
Acid-base deficit: 2 mmol/L (ref 0.0–2.0)
Bicarbonate: 18.9 mEq/L — ABNORMAL LOW (ref 20.0–24.0)
Bicarbonate: 22.5 mEq/L (ref 20.0–24.0)
O2 Saturation: 98 %
Patient temperature: 36.5
Patient temperature: 98.6
pCO2 arterial: 25.8 mmHg — ABNORMAL LOW (ref 35.0–45.0)
pH, Arterial: 7.47 — ABNORMAL HIGH (ref 7.350–7.450)
pO2, Arterial: 107 mmHg — ABNORMAL HIGH (ref 80.0–100.0)

## 2012-01-02 LAB — APTT: aPTT: 28 seconds (ref 24–37)

## 2012-01-02 LAB — PREPARE RBC (CROSSMATCH)

## 2012-01-02 LAB — LACTIC ACID, PLASMA: Lactic Acid, Venous: 4.6 mmol/L — ABNORMAL HIGH (ref 0.5–2.2)

## 2012-01-02 MED ORDER — SODIUM CHLORIDE 0.9 % IV BOLUS (SEPSIS)
1000.0000 mL | INTRAVENOUS | Status: AC
  Administered 2012-01-02: 1000 mL via INTRAVENOUS

## 2012-01-02 MED ORDER — SODIUM CHLORIDE 0.9 % IV BOLUS (SEPSIS)
1000.0000 mL | Freq: Once | INTRAVENOUS | Status: AC
  Administered 2012-01-02: 1000 mL via INTRAVENOUS

## 2012-01-02 MED ORDER — SODIUM CHLORIDE 0.9 % IV SOLN
8.0000 mg/h | INTRAVENOUS | Status: DC
  Administered 2012-01-02 – 2012-01-04 (×5): 8 mg/h via INTRAVENOUS
  Filled 2012-01-02 (×10): qty 80

## 2012-01-02 MED ORDER — PIPERACILLIN-TAZOBACTAM 3.375 G IVPB
3.3750 g | Freq: Three times a day (TID) | INTRAVENOUS | Status: DC
  Administered 2012-01-03 – 2012-01-05 (×7): 3.375 g via INTRAVENOUS
  Filled 2012-01-02 (×9): qty 50

## 2012-01-02 MED ORDER — ONDANSETRON HCL 4 MG/2ML IJ SOLN
INTRAMUSCULAR | Status: AC
  Administered 2012-01-02: 4 mg via INTRAVENOUS
  Filled 2012-01-02: qty 2

## 2012-01-02 MED ORDER — FENTANYL CITRATE 0.05 MG/ML IJ SOLN
INTRAMUSCULAR | Status: AC
  Administered 2012-01-02: 100 ug via INTRAVENOUS
  Filled 2012-01-02: qty 2

## 2012-01-02 MED ORDER — METOCLOPRAMIDE HCL 5 MG/ML IJ SOLN
10.0000 mg | Freq: Once | INTRAMUSCULAR | Status: AC
  Administered 2012-01-02: 10 mg via INTRAVENOUS
  Filled 2012-01-02: qty 2

## 2012-01-02 MED ORDER — SUCCINYLCHOLINE CHLORIDE 20 MG/ML IJ SOLN
20.0000 mg | Freq: Once | INTRAMUSCULAR | Status: DC
  Filled 2012-01-02: qty 1

## 2012-01-02 MED ORDER — SODIUM CHLORIDE 0.9 % IV SOLN
2.0000 mg/h | INTRAVENOUS | Status: DC
  Administered 2012-01-02: 2 mg/h via INTRAVENOUS
  Filled 2012-01-02 (×2): qty 10

## 2012-01-02 MED ORDER — OCTREOTIDE LOAD VIA INFUSION
50.0000 ug | Freq: Once | INTRAVENOUS | Status: DC
  Filled 2012-01-02: qty 25

## 2012-01-02 MED ORDER — ETOMIDATE 2 MG/ML IV SOLN
INTRAVENOUS | Status: AC
  Administered 2012-01-02: 20 mg
  Filled 2012-01-02: qty 10

## 2012-01-02 MED ORDER — MIDAZOLAM BOLUS VIA INFUSION
1.0000 mg | INTRAVENOUS | Status: DC | PRN
  Filled 2012-01-02: qty 2

## 2012-01-02 MED ORDER — VASOPRESSIN 20 UNIT/ML IJ SOLN
0.0300 [IU]/min | INTRAVENOUS | Status: DC
  Administered 2012-01-02: 0.04 [IU]/min via INTRAVENOUS
  Administered 2012-01-04: 0.03 [IU]/min via INTRAVENOUS
  Filled 2012-01-02 (×2): qty 2.5

## 2012-01-02 MED ORDER — VANCOMYCIN HCL IN DEXTROSE 1-5 GM/200ML-% IV SOLN
1000.0000 mg | Freq: Once | INTRAVENOUS | Status: AC
  Administered 2012-01-02: 1000 mg via INTRAVENOUS
  Filled 2012-01-02: qty 200

## 2012-01-02 MED ORDER — NOREPINEPHRINE BITARTRATE 1 MG/ML IJ SOLN
2.0000 ug/min | INTRAVENOUS | Status: DC
  Administered 2012-01-02: 1.013 ug/min via INTRAVENOUS
  Administered 2012-01-02: 30.133 ug/min via INTRAVENOUS
  Administered 2012-01-03: 15 ug/min via INTRAVENOUS
  Filled 2012-01-02 (×4): qty 4

## 2012-01-02 MED ORDER — SODIUM CHLORIDE 0.9 % IV SOLN
50.0000 ug/h | INTRAVENOUS | Status: DC
  Administered 2012-01-02 – 2012-01-04 (×3): 50 ug/h via INTRAVENOUS
  Filled 2012-01-02 (×10): qty 1

## 2012-01-02 MED ORDER — FENTANYL CITRATE 0.05 MG/ML IJ SOLN
50.0000 ug | Freq: Once | INTRAMUSCULAR | Status: AC
  Administered 2012-01-02: 100 ug via INTRAVENOUS
  Filled 2012-01-02: qty 2

## 2012-01-02 MED ORDER — FENTANYL BOLUS VIA INFUSION
50.0000 ug | Freq: Four times a day (QID) | INTRAVENOUS | Status: DC | PRN
  Filled 2012-01-02: qty 100

## 2012-01-02 MED ORDER — PIPERACILLIN-TAZOBACTAM 3.375 G IVPB 30 MIN
3.3750 g | Freq: Once | INTRAVENOUS | Status: AC
  Administered 2012-01-02: 3.375 g via INTRAVENOUS
  Filled 2012-01-02: qty 50

## 2012-01-02 MED ORDER — SODIUM CHLORIDE 0.9 % IV SOLN
80.0000 mg | INTRAVENOUS | Status: AC
  Administered 2012-01-02: 80 mg via INTRAVENOUS
  Filled 2012-01-02: qty 80

## 2012-01-02 MED ORDER — SODIUM CHLORIDE 0.9 % IV SOLN
50.0000 ug/h | INTRAVENOUS | Status: DC
  Administered 2012-01-02 – 2012-01-04 (×2): 50 ug/h via INTRAVENOUS
  Filled 2012-01-02 (×2): qty 50

## 2012-01-02 MED ORDER — HYDROCORTISONE SOD SUCCINATE 100 MG IJ SOLR
50.0000 mg | Freq: Four times a day (QID) | INTRAMUSCULAR | Status: DC
  Administered 2012-01-03 – 2012-01-05 (×10): 50 mg via INTRAVENOUS
  Filled 2012-01-02 (×14): qty 1

## 2012-01-02 MED ORDER — ALBUMIN HUMAN 25 % IV SOLN
12.5000 g | Freq: Once | INTRAVENOUS | Status: AC
  Administered 2012-01-02: 12.5 g via INTRAVENOUS
  Filled 2012-01-02: qty 50

## 2012-01-02 MED ORDER — MIDAZOLAM HCL 2 MG/2ML IJ SOLN
INTRAMUSCULAR | Status: AC
  Administered 2012-01-02: 2 mg
  Filled 2012-01-02: qty 2

## 2012-01-02 MED ORDER — VANCOMYCIN HCL 500 MG IV SOLR
500.0000 mg | INTRAVENOUS | Status: DC
  Administered 2012-01-03 – 2012-01-04 (×2): 500 mg via INTRAVENOUS
  Filled 2012-01-02 (×4): qty 500

## 2012-01-02 MED ORDER — ONDANSETRON HCL 4 MG/2ML IJ SOLN
4.0000 mg | Freq: Once | INTRAMUSCULAR | Status: AC
  Administered 2012-01-02: 4 mg via INTRAVENOUS

## 2012-01-02 MED ORDER — ONDANSETRON HCL 4 MG/2ML IJ SOLN
4.0000 mg | Freq: Once | INTRAMUSCULAR | Status: AC
  Administered 2012-01-02: 4 mg via INTRAVENOUS
  Filled 2012-01-02: qty 2

## 2012-01-02 NOTE — ED Notes (Signed)
Phlebotomy in to draw blood for labs  

## 2012-01-02 NOTE — ED Notes (Signed)
Pt 3 man log-rolled off LSB while maintaining c-spine control; pt remains in supine position with EMS c-collar intact; good PMS x4 extremities prior to and after log rolling

## 2012-01-02 NOTE — ED Notes (Signed)
EDP made aware of current BP - in to assess

## 2012-01-02 NOTE — ED Notes (Signed)
EDP in to assess 

## 2012-01-02 NOTE — ED Notes (Signed)
P 

## 2012-01-02 NOTE — ED Notes (Signed)
Pt heaving - reports recurrence of nausea

## 2012-01-02 NOTE — ED Notes (Signed)
EDP raised HOB 15 degrees - suction available at bedside for subsequent vomiting

## 2012-01-02 NOTE — ED Notes (Signed)
Bair Hugger placed on pt for current hypothermia - EDP aware of same

## 2012-01-02 NOTE — ED Notes (Signed)
Patient transported to CT and then to room 2106

## 2012-01-02 NOTE — ED Notes (Signed)
While conversing with pt, she began to moan constantly and noted a constant gaze to rgt side; pt would not respond verbally to tactile nor verbal stimuli; pupils 4 mm and slow to react; episode lasted approx 10 seconds then pt noted to respond verbally but confused; gradually became oriented to person, place, time

## 2012-01-02 NOTE — ED Provider Notes (Signed)
History     CSN: [id number]  Arrival date & time 01/02/12  1722   None     Chief Complaint  Patient presents with  . Fall    fell - unk reason - while sweeping floor at home - no LOC; c/o pain to entire left side of body; additonally, dark red emesis noted per EMS - pt reports onset of this at approx 1100 this am; abd distended; however, pt states this is normal secondary to her liver failure; scheduled for paracentesis this week       (Consider location/radiation/quality/duration/timing/severity/associated sxs/prior treatment) Patient is a 61 y.o. female presenting with fall. The history is provided by the patient.  Fall The accident occurred 12 to 24 hours ago. The fall occurred while walking. Distance fallen: standing. She landed on a hard floor. There was no blood loss. Point of impact: left shoulder, left hip. The pain is at a severity of 6/10. The pain is mild. She was ambulatory at the scene. There was no entrapment after the fall. Associated symptoms include nausea and vomiting. Pertinent negatives include no fever, no abdominal pain, no hematuria, no headaches and no loss of consciousness. The symptoms are aggravated by activity. Treatment on scene includes a c-collar and a backboard. She has tried nothing for the symptoms. The treatment provided no relief.    Past Medical History  Diagnosis Date  . Hypertension   . Osteoporosis   . Alcoholic liver disease   . High cholesterol   . Peripheral vascular disease   . Shortness of breath 09/22/11    "because of all the fluid that's built up in my stomach"  . Ascites 09/22/11    "first time for me"  . Anemia   . History of blood transfusion 07/2011    "when I had elbow surgery"  . Arthritis     "all over"  . Dysrhythmia     "irregular"    Past Surgical History  Procedure Date  . Fracture surgery   . Amputation     two toes on left foot  . Bunionectomy     both feet  . Arthroscopic repair acl     right knee  . Joint  replacement     right hip   . Orif humerus fracture 07/26/2011    Procedure: OPEN REDUCTION INTERNAL FIXATION (ORIF) DISTAL HUMERUS FRACTURE;  Surgeon: Budd Palmer, MD;  Location: [facility] OR;  Service: Orthopedics;  Laterality: Right;  . Total hip arthroplasty ~ 2003    right  . Tubal ligation 1980  . Paracentesis 09/22/11    2.2L  . Esophagogastroduodenoscopy 09/23/2011    Procedure: ESOPHAGOGASTRODUODENOSCOPY (EGD);  Surgeon: Shirley Friar, MD;  Location: [facility] ENDOSCOPY;  Service: Endoscopy;  Laterality: N/A;    No family history on file.  History  Substance Use Topics  . Smoking status: Never Smoker   . Smokeless tobacco: Never Used  . Alcohol Use: 3.6 oz/week    6 Shots of liquor per week     "last drink was day before OR in 07/2011"    OB History    Grav Para Term Preterm Abortions TAB SAB Ect Mult Living                  Review of Systems  Constitutional: Negative for fever and fatigue.  HENT: Negative for congestion, drooling and neck pain.   Eyes: Negative for pain.  Respiratory: Negative for cough and shortness of breath.   Cardiovascular: Negative for chest  pain.  Gastrointestinal: Positive for nausea and vomiting. Negative for abdominal pain and diarrhea.  Genitourinary: Negative for dysuria and hematuria.  Musculoskeletal: Negative for back pain and gait problem.  Skin: Negative for color change.  Neurological: Negative for dizziness, loss of consciousness and headaches.  Hematological: Negative for adenopathy.  Psychiatric/Behavioral: Negative for behavioral problems.  All other systems reviewed and are negative.    Allergies  Review of patient's allergies indicates no known allergies.  Home Medications   Current Outpatient Rx  Name Route Sig Dispense Refill  . CALCIUM + D PO Oral Take 1,600 mg by mouth daily.    . RESOURCE BREEZE PO LIQD Oral Take 1 Container by mouth 3 (three) times daily between meals. 30 Container 2  . FUROSEMIDE 20 MG PO TABS Oral  Take 1 tablet (20 mg total) by mouth daily. 30 tablet 0  . HYDROCODONE-ACETAMINOPHEN 5-325 MG PO TABS Oral Take 1 tablet by mouth every 6 (six) hours as needed. For pain 30 tablet 0  . PANTOPRAZOLE SODIUM 40 MG PO TBEC Oral Take 1 tablet (40 mg total) by mouth 2 (two) times daily before a meal. 60 tablet 0  . SPIRONOLACTONE 100 MG PO TABS Oral Take 1 tablet (100 mg total) by mouth daily. 30 tablet 0    BP 70/55  Pulse 135  Temp 91.4 F (33 C) (Core (Comment))  Resp 18  SpO2 100%  Physical Exam  Nursing note and vitals reviewed. Constitutional: She is oriented to person, place, and time. She appears well-developed and well-nourished.  HENT:  Head: Normocephalic.  Mouth/Throat: No oropharyngeal exudate.  Eyes: Conjunctivae normal and EOM are normal. Pupils are equal, round, and reactive to light.  Neck: Normal range of motion. Neck supple.  Cardiovascular: Normal heart sounds and intact distal pulses.  Exam reveals no gallop and no friction rub.   No murmur heard.      tachycardic  Pulmonary/Chest: Effort normal and breath sounds normal. No respiratory distress. She has no wheezes.  Abdominal: Soft. Bowel sounds are normal. There is tenderness (diffusely, moderate distension cw ascites). There is no rebound and no guarding.  Musculoskeletal: Normal range of motion. She exhibits no edema and no tenderness.       Multiple ecchymosis on left lateral back, left lateral ribs. Ttp of left lateral hip, normal ROM of bilateral LE's. Ttp of left lateral shoulder and left elbow. Normal rom of bilateral UE's. Mild to mod ttp of thoracic spine on exam.   Neurological: She is alert and oriented to person, place, and time.  Skin: Skin is warm and dry.  Psychiatric: She has a normal mood and affect. Her behavior is normal.    ED Course  CENTRAL LINE Date/Time: 01/02/2012 8:00 PM Performed by: Purvis Sheffield Authorized by: Linwood Dibbles R Consent: Verbal consent not obtained. The procedure was  performed in an emergent situation. Consent given by: patient Required items: required blood products, implants, devices, and special equipment available Patient identity confirmed: arm band, provided demographic data, hospital-assigned identification number and verbally with patient Time out: Immediately prior to procedure a "time out" was called to verify the correct patient, procedure, equipment, support staff and site/side marked as required. Indications: vascular access and central pressure monitoring Anesthesia: local infiltration Local anesthetic: lidocaine 1% without epinephrine Anesthetic total: 1 ml Patient sedated: no Preparation: skin prepped with 2% chlorhexidine Skin prep agent dried: skin prep agent completely dried prior to procedure Maximum sterile barriers used during central venous catheter insertion: Cap not  used due to emergent situation. Hand hygiene: hand hygiene performed prior to central venous catheter insertion Location details: right internal jugular Patient position: Trendelenburg Catheter type: triple lumen Pre-procedure: landmarks identified Ultrasound guidance: yes Number of attempts: 1 Successful placement: yes Post-procedure: line sutured and dressing applied Assessment: blood return through all parts, free fluid flow, placement verified by x-ray and no pneumothorax on x-ray Complications: local hematoma Patient tolerance: Patient tolerated the procedure well with no immediate complications.   (including critical care time)   Labs Reviewed  CBC WITH DIFFERENTIAL  COMPREHENSIVE METABOLIC PANEL  APTT  PROTIME-INR  URINALYSIS, WITH MICROSCOPIC  LACTIC ACID, PLASMA  TYPE AND SCREEN  OCCULT BLOOD GASTRIC / DUODENUM  CULTURE, BLOOD (ROUTINE X 2)  CULTURE, BLOOD (ROUTINE X 2)  URINALYSIS, ROUTINE W REFLEX MICROSCOPIC  URINE CULTURE   No results found.   No diagnosis found.   Date: 01/02/2012  Rate: 126  Rhythm: sinus tachycardia  QRS Axis:  normal  Intervals: normal  ST/T Wave abnormalities: normal  Conduction Disutrbances:none  Narrative Interpretation: No new ST or T wave changes cw ischemia, PVC noted  Old EKG Reviewed: unchanged    MDM  5:53 PM 61 y.o. female w hx of alcoholic liver dis, ascites, self reported hx of gi bleed pw mechanical fall from standing at 2 am this morning. Pt denies hitting head, currently c/o left sided pain. She began having coffee ground emesis at noon and has had emesis x 10-12x thus far. Pt tachycardic, hypotensive on exam, mentating well, but vomited several more times. Will get IVF, labs, type/screen, protonix IV. Hemeoccult trace pos on exam.   BP continues to trend down, pt remains tachycardic in 120's. Will start IV abx, Vanc/Zosyn for sepsis with thus far unknown source. Will place central line, will start levophed. Blood cx's drawn. Critical care consulted.   Clinical Impression 1. Fall   2. Septic shock   3. Lactic acidosis   4. Acute renal failure   5. Coffee ground emesis          Purvis Sheffield, MD 01/03/12 [phone]

## 2012-01-02 NOTE — H&P (Signed)
Name: SCHERYL SANBORN MRN: [id number] DOB: Aug 01, 1950    LOS: 0  Referring Provider:  EDP Reason for Referral:  Shock  PULMONARY / CRITICAL CARE MEDICINE  The patient is encephalopathic and unable to provide history, which was obtained for available medical records.  HPI:  61 yo with alcoholic liver disease presenting to [facility] ED s/p fall, found to be profoundly hypotensive, with coffee ground emesis.   Past Medical History  Diagnosis Date  . Hypertension   . Osteoporosis   . Alcoholic liver disease   . High cholesterol   . Peripheral vascular disease   . Shortness of breath 09/22/11    "because of all the fluid that's built up in my stomach"  . Ascites 09/22/11    "first time for me"  . Anemia   . History of blood transfusion 07/2011    "when I had elbow surgery"  . Arthritis     "all over"  . Dysrhythmia     "irregular"   Past Surgical History  Procedure Date  . Fracture surgery   . Amputation     two toes on left foot  . Bunionectomy     both feet  . Arthroscopic repair acl     right knee  . Joint replacement     right hip   . Orif humerus fracture 07/26/2011    Procedure: OPEN REDUCTION INTERNAL FIXATION (ORIF) DISTAL HUMERUS FRACTURE;  Surgeon: Budd Palmer, MD;  Location: [facility] OR;  Service: Orthopedics;  Laterality: Right;  . Total hip arthroplasty ~ 2003    right  . Tubal ligation 1980  . Paracentesis 09/22/11    2.2L  . Esophagogastroduodenoscopy 09/23/2011    Procedure: ESOPHAGOGASTRODUODENOSCOPY (EGD);  Surgeon: Shirley Friar, MD;  Location: [facility] ENDOSCOPY;  Service: Endoscopy;  Laterality: N/A;   Prior to Admission medications   Medication Sig Start Date End Date Taking? Authorizing Provider  Calcium Carbonate-Vitamin D (CALCIUM + D PO) Take 1,600 mg by mouth daily.    Historical Provider, MD  feeding supplement (RESOURCE BREEZE) LIQD Take 1 Container by mouth 3 (three) times daily between meals. 10/12/11   Stephani Police, PA  furosemide (LASIX) 20 MG tablet  Take 1 tablet (20 mg total) by mouth daily. 09/27/11 09/26/12  Zannie Cove, MD  HYDROcodone-acetaminophen (NORCO) 5-325 MG per tablet Take 1 tablet by mouth every 6 (six) hours as needed. For pain 09/27/11   Zannie Cove, MD  pantoprazole (PROTONIX) 40 MG tablet Take 1 tablet (40 mg total) by mouth 2 (two) times daily before a meal. 09/27/11 09/26/12  Zannie Cove, MD  spironolactone (ALDACTONE) 100 MG tablet Take 1 tablet (100 mg total) by mouth daily. 10/12/11 10/11/12  Stephani Police, PA   Allergies No Known Allergies  Family History No family history on file. Social History  reports that she has never smoked. She has never used smokeless tobacco. She reports that she drinks about 3.6 ounces of alcohol per week. She reports that she does not use illicit drugs.  Review Of Systems:  Unable to provide  Brief patient description:  62 yo with alcoholic liver disease presenting to [facility] ED s/p fall, found to be profoundly hypotensive, with coffee ground emesis.  Events Since Admission: 9/16 >>> Presenting to [facility] ED s/p fall, found to be profoundly hypotensive, with coffee ground emesis.  Current Status:  Vital Signs: Temp:  [91.4 F (33 C)-98.1 F (36.7 C)] 97.7 F (36.5 C) (09/16 2051) Pulse Rate:  [86-160] 133  (  09/16 2051) Resp:  [18-33] 33  (09/16 2051) BP: (41-86)/(23-66) 58/40 mmHg (09/16 2051) SpO2:  [66 %-100 %] 66 % (09/16 2051)  Physical Examination: General:  Appears acutely ill Neuro:  Awake, somnolent but arouses to stimulation, nonfocal, cough / gag present HEENT:  PERRL, dry membranes Neck:  No JVD Cardiovascular:  Regular rhythm, rapid rate Lungs:  Bilateral air entry, no w/r/r Abdomen:  Distended, soft, mild generalized tenderness Musculoskeletal:  Moves all extremities, no overt trauma Skin:  No rash  Active Problems:  Lactic acidosis  Septic shock  Fall  Acute renal failure  Coffee ground emesis  Hemorrhagic shock  Acute respiratory failure  ASSESSMENT  AND PLAN  PULMONARY No results found for this basename: PHART:5,PCO2:5,PCO2ART:5,PO2ART:5,HCO3:5,O2SAT:5 in the last 168 hours  Ventilator Settings:   CXR:  9/16 >>> R IJ TLC in place, small volumes, bilateral small effusions, no overt infiltrates ETT:  NA  A:  Protects airway at this point but impending failure. P:   Would intubate when more hemodynamically stable ABG  Goal SpO2 > 92, pH . 7.30 Supplemental oxygen  CARDIOVASCULAR  Lab 01/02/12 1748  TROPONINI --  LATICACIDVEN 4.6*  PROBNP --   ECG:  9/16 >>> Sinus tachycardia, nonspecific ST-T changes Lines:  R IJ TLC 9/16 >>>  A: Severe shock (septic vs hemorrhagic). P:  Goal MAP > 60 Trend lactic acid Levophed gtt Vasopressin gtt A-line  RENAL  Lab 01/02/12 1805  NA 135  K 3.8  CL 91*  CO2 28  BUN 30*  CREATININE 1.71*  CALCIUM 11.4*  MG --  PHOS --   Intake/Output      09/16 0701 - 09/17 0700   I.V. 2350   Total Intake 2350   Urine 30   Total Output 30   Net +2320        Foley:  9/16 >>>  A:  AKI.  Hypovolemia. P:   Hold diuretics Goal CVP 10 to12 Received NS 1000 mL x 4 + Albumin 25% 12.5 g x 1 NS boluses PRN for gola CVP Avoid IV contrast  Trend BMP  GASTROINTESTINAL  Lab 01/02/12 1805  AST 37  ALT 9  ALKPHOS 248*  BILITOT 1.4*  PROT 5.9*  ALBUMIN 1.7*   A:  Alcoholic cirrhosis.  Suspected upper GI hemorrhage.  Possible variceal hemorrhage.  Possible SBP.  Possible intraabdominal hemorrhage. P:   NPO Octreotide gtt Protonix gtt GI consult Surgery consult  HEMATOLOGIC  Lab 01/02/12 1805  HGB 9.5*  HCT 27.5*  PLT 288  INR 1.19  APTT 28   A:  Acute anemia, source unclear. Hb 10-->6 P:  Trend CBC Transfuse 4 units PRBC (emergency release)  INFECTIOUS  Lab 01/02/12 1805  WBC 22.0*  PROCALCITON --   Cultures: 9/16  Blood >>> 9/16  Urine >>> Antibiotics: Vancomycin 9/16 >>> Zosym 9/16 >>>  A:  Suspected septic shock.  Suspected SBP. P:   Antibiotics /  cultures as above PCT  ENDOCRINE No results found for this basename: GLUCAP:5 in the last 168 hours  A:  Presumed relative adrenal insufficiency. P:   Hydrocortisone  NEUROLOGIC  A:  Acute encephalopathy (septic, metabolic).  Protects airway. P:   Fentanyl / Versed gtt when intubated  BEST PRACTICE / DISPOSITION Level of Care:  ICU Primary Service:  PCCM Consultants:  Surgery, GI Code Status:  Full Diet:  NPO DVT Px:  Not indicated (on Protonix gtt) GI Px:  SCDs Skin Integrity:  Intact Social / Family:  Family  is updated at bedside  The patient is critically ill with multiple organ systems failure and requires high complexity decision making for assessment and support, frequent evaluation and titration of therapies, application of advanced monitoring technologies and extensive interpretation of multiple databases. Critical Care Time devoted to patient care services described in this note is 75 minutes.   Lonia Farber, M.D. Pulmonary and Critical Care Medicine [facility] Pager: [phone]  01/02/2012, 8:57 PM

## 2012-01-02 NOTE — Consult Note (Signed)
Referring Provider: Dr. Blima Dessert (Pulm/Crit Care Med) Primary Care Physician:  Willow Ora, MD Primary Gastroenterologist:  Dr. Dulce Sellar  Reason for Consultation:  GI bleed  HPI: Kathryn [name] is a 61 y.o. female admitted through the emergency room this evening. She has a history of alcoholic liver disease, and reportedly fell at home, came to the emergency room, and was found to be hypotensive. History is obtained from the other hospital coworkers and staff, since the patient herself is intubated.  In the emergency room, the patient apparently had hematemesis of moderately large quantity. She was transfused 2 units of blood when her hemoglobin dropped while in the emergency room, although that was in the context of both crystalloid and colloid infusion. She has had no further bleeding in the past hour or 2. She is already on empiric octreotide infusion as well as IV Protonix.  She has been intubated, presumably for airway protection, since I did not obtain any history of respiratory insufficiency or respiratory arrest.   Her pressure has been sustained about 90 systolic with the help of bleed of that. She is tachycardic, with a heart rate in the 130 to 150 range.  The patient was seen in the hospital by our group back in June, at which time endoscopy showed distal circumferential erosive esophagitis, but no evidence of esophageal varices and some questionable mild portal gastropathy.   Her main clinical problem since that hospitalization has been somewhat refractory ascites, status post several large-volume paracenteses, most recently about 3 weeks ago. She is followed by Dr. Willis Modena, who last saw her in the office 10 days ago, at which time there was some mild peripheral edema and some re-formation of her ascites. He has had to be conservative with diuretic dosing, due to a serum creatinine of 1.5.   Of note, the patient had a CT scan tonight after arriving the emergency room, which does not  show evidence of hemoperitoneum, but does show evidence for left lower lobe pneumonia.    Past Medical History  Diagnosis Date  . Hypertension   . Osteoporosis   . Alcoholic liver disease   . High cholesterol   . Peripheral vascular disease   . Shortness of breath 09/22/11    "because of all the fluid that's built up in my stomach"  . Ascites 09/22/11    "first time for me"  . Anemia   . History of blood transfusion 07/2011    "when I had elbow surgery"  . Arthritis     "all over"  . Dysrhythmia     "irregular"    Past Surgical History  Procedure Date  . Fracture surgery   . Amputation     two toes on left foot  . Bunionectomy     both feet  . Arthroscopic repair acl     right knee  . Joint replacement     right hip   . Orif humerus fracture 07/26/2011    Procedure: OPEN REDUCTION INTERNAL FIXATION (ORIF) DISTAL HUMERUS FRACTURE;  Surgeon: Budd Palmer, MD;  Location: [facility] OR;  Service: Orthopedics;  Laterality: Right;  . Total hip arthroplasty ~ 2003    right  . Tubal ligation 1980  . Paracentesis 09/22/11    2.2L  . Esophagogastroduodenoscopy 09/23/2011    Procedure: ESOPHAGOGASTRODUODENOSCOPY (EGD);  Surgeon: Shirley Friar, MD;  Location: [facility] ENDOSCOPY;  Service: Endoscopy;  Laterality: N/A;    Prior to Admission medications   Medication Sig Start Date End Date Taking?  Authorizing Provider  Calcium Carbonate-Vitamin D (CALCIUM + D PO) Take 1,600 mg by mouth daily.    Historical Provider, MD  feeding supplement (RESOURCE BREEZE) LIQD Take 1 Container by mouth 3 (three) times daily between meals. 10/12/11   Stephani Police, PA  furosemide (LASIX) 20 MG tablet Take 1 tablet (20 mg total) by mouth daily. 09/27/11 09/26/12  Zannie Cove, MD  HYDROcodone-acetaminophen (NORCO) 5-325 MG per tablet Take 1 tablet by mouth every 6 (six) hours as needed. For pain 09/27/11   Zannie Cove, MD  pantoprazole (PROTONIX) 40 MG tablet Take 1 tablet (40 mg total) by mouth 2 (two) times  daily before a meal. 09/27/11 09/26/12  Zannie Cove, MD  spironolactone (ALDACTONE) 100 MG tablet Take 1 tablet (100 mg total) by mouth daily. 10/12/11 10/11/12  Stephani Police, PA    Current Facility-Administered Medications  Medication Dose Route Frequency Provider Last Rate Last Dose  . albumin human 25 % solution 12.5 g  12.5 g Intravenous Once Purvis Sheffield, MD   12.5 g at 01/02/12 1942  . etomidate (AMIDATE) 2 MG/ML injection        20 mg at 01/02/12 2209  . fentaNYL (SUBLIMAZE) 10 mcg/mL in sodium chloride 0.9 % 250 mL infusion  50-400 mcg/hr Intravenous Titrated Lonia Farber, MD 5 mL/hr at 01/02/12 2232 50 mcg/hr at 01/02/12 2232   And  . fentaNYL (SUBLIMAZE) bolus via infusion 50-100 mcg  50-100 mcg Intravenous Q6H PRN Lonia Farber, MD      . fentaNYL (SUBLIMAZE) injection 50 mcg  50 mcg Intravenous Once Lonia Farber, MD   100 mcg at 01/02/12 2216  . hydrocortisone sodium succinate (SOLU-CORTEF) 100 mg/2 mL injection 50 mg  50 mg Intravenous Q6H Konstantin Zubelevitskiy, MD      . metoCLOPramide (REGLAN) injection 10 mg  10 mg Intravenous Once Purvis Sheffield, MD   10 mg at 01/02/12 1836  . midazolam (VERSED) 1 mg/mL in sodium chloride 0.9 % 50 mL infusion  2-10 mg/hr Intravenous Titrated Lonia Farber, MD 2 mL/hr at 01/02/12 2231 2 mg/hr at 01/02/12 2231   And  . midazolam (VERSED) bolus via infusion 1-2 mg  1-2 mg Intravenous Q2H PRN Lonia Farber, MD      . midazolam (VERSED) 2 MG/2ML injection        2 mg at 01/02/12 2216  . norepinephrine (LEVOPHED) 4 mg in dextrose 5 % 250 mL infusion  2-50 mcg/min Intravenous Titrated Purvis Sheffield, MD 113 mL/hr at 01/02/12 2050 30.133 mcg/min at 01/02/12 2050  . octreotide (SANDOSTATIN) 2 mcg/mL in sodium chloride 0.9 % 250 mL infusion  50 mcg/hr Intravenous Continuous Konstantin Zubelevitskiy, MD      . octreotide (SANDOSTATIN) 2 mcg/mL load via infusion 50 mcg  50 mcg Intravenous  Once Lonia Farber, MD      . ondansetron (ZOFRAN) injection 4 mg  4 mg Intravenous Once Celene Kras, MD   4 mg at 01/02/12 1741  . ondansetron (ZOFRAN) injection 4 mg  4 mg Intravenous Once Purvis Sheffield, MD   4 mg at 01/02/12 1817  . pantoprazole (PROTONIX) 80 mg in sodium chloride 0.9 % 100 mL IVPB  80 mg Intravenous STAT Purvis Sheffield, MD   80 mg at 01/02/12 1836  . pantoprazole (PROTONIX) 80 mg in sodium chloride 0.9 % 250 mL infusion  8 mg/hr Intravenous Continuous Purvis Sheffield, MD 25 mL/hr at 01/02/12 1837 8 mg/hr at 01/02/12 1837  . piperacillin-tazobactam (ZOSYN) IVPB 3.375 g  3.375 g  Intravenous Once Purvis Sheffield, MD   3.375 g at 01/02/12 1843  . piperacillin-tazobactam (ZOSYN) IVPB 3.375 g  3.375 g Intravenous Q8H Marty Heck, PHARMD      . sodium chloride 0.9 % bolus 1,000 mL  1,000 mL Intravenous STAT Purvis Sheffield, MD   1,000 mL at 01/02/12 1752  . sodium chloride 0.9 % bolus 1,000 mL  1,000 mL Intravenous Once Celene Kras, MD   1,000 mL at 01/02/12 1839  . succinylcholine (ANECTINE) injection 20 mg  20 mg Intravenous Once Lonia Farber, MD      . vancomycin (VANCOCIN) 500 mg in sodium chloride 0.9 % 100 mL IVPB  500 mg Intravenous Q24H Marty Heck, PHARMD      . vancomycin (VANCOCIN) IVPB 1000 mg/200 mL premix  1,000 mg Intravenous Once Purvis Sheffield, MD   1,000 mg at 01/02/12 1859  . vasopressin (PITRESSIN) 50 Units in sodium chloride 0.9 % 250 mL infusion  0.03 Units/min Intravenous Continuous Lonia Farber, MD 12 mL/hr at 01/02/12 2228 0.04 Units/min at 01/02/12 2228    Allergies as of 01/02/2012  . (No Known Allergies)    No family history on file.  History   Social History  . Marital Status: Married    Spouse Name: N/A    Number of Children: N/A  . Years of Education: N/A   Occupational History  . Not on file.   Social History Main Topics  . Smoking status: Never Smoker   . Smokeless tobacco: Never Used  .  Alcohol Use: 3.6 oz/week    6 Shots of liquor per week     "last drink was day before OR in 07/2011"  . Drug Use: No  . Sexually Active: Not Currently   Other Topics Concern  . Not on file   Social History Narrative  . No narrative on file    Review of Systems: Unobtainable, since the patient is intubated   Physical Exam: Vital signs in last 24 hours: Temp:  [91.4 F (33 C)-98.1 F (36.7 C)] 98.1 F (36.7 C) (09/16 2215) Pulse Rate:  [39-164] 133  (09/16 2300) Resp:  [18-34] 21  (09/16 2300) BP: (41-114)/(23-73) 84/57 mmHg (09/16 2300) SpO2:  [66 %-100 %] 98 % (09/16 2300) Arterial Line BP: (56-145)/(36-104) 140/94 mmHg (09/16 2215) FiO2 (%):  [100 %] 100 % (09/16 2300)   General:  intubated. Marked muscle wasting.  Head:  Normocephalic and atraumatic. Eyes:  Sclera clear, no icterus.   Mouth:   Oral gastric tube and endotracheal tube in place Lungs:  Some rhonchi at left base, correlating with CT findings. The patient is intubated and sedated.  Heart:   Regular rate and rhythm; no murmurs, clicks, rubs,  or gallops. Abdomen:   markedly protuberant consistent with ascites, but quite soft. Epigastric mass effect consistent with enlarged left lobe of liver, no evident splenomegaly. No evident tenderness, no rigidity, no obvious peritoneal findings  Rectal:   generous amount of well-formed brown stool    Pulses:   unable to feel radial pulse on the right side. Left side has arterial line  Extremities:   Without clubbing, cyanosis, or edema. Neurologic:  sedated on ventilator, unable to assess  Skin: no jaundice. Some spider angiomata on the anterior trunk .   Intake/Output from previous day:   Intake/Output this shift: Total I/O In: 1350 [I.V.:1350] Out: -   Lab Results:  Basename 01/02/12 2225 01/02/12 2051 01/02/12 1805  WBC -- -- 22.0*  HGB 11.6*  6.1* 9.5*  HCT 34.0* 18.0* 27.5*  PLT -- -- 288   BMET  Basename 01/02/12 2225 01/02/12 2051 01/02/12 1805  NA  139 137 135  K 3.7 4.0 3.8  CL 106 104 91*  CO2 -- -- 28  GLUCOSE 153* 100* 89  BUN 24* 30* 30*  CREATININE 1.50* 1.60* 1.71*  CALCIUM -- -- 11.4*   LFT  Basename 01/02/12 1805  PROT 5.9*  ALBUMIN 1.7*  AST 37  ALT 9  ALKPHOS 248*  BILITOT 1.4*  BILIDIR --  IBILI --   PT/INR  Basename 01/02/12 1805  LABPROT 15.4*  INR 1.19    Studies/Results: Ct Abdomen Pelvis Wo Contrast  01/02/2012  *RADIOLOGY REPORT*  Clinical Data: Suspected abdominal sepsis.  Hypotension.  CT ABDOMEN AND PELVIS WITHOUT CONTRAST  Technique:  Multidetector CT imaging of the abdomen and pelvis was performed following the standard protocol without intravenous contrast.  Comparison: 10/11/2011.  Findings: Again noted changes of hepatic cirrhosis with enlarged lateral segment left lobe and atrophy of the right lobe.  Normal spleen.  Gallbladder sludge.  Extensive free fluid throughout the abdomen similar attenuation to priors, consistent with ascites, not hemoperitoneum.  Normal pancreas, adrenal glands, abdominal aorta, and retroperitoneal lymph nodes.  Nondilated renal collecting systems.  4 mm stone dependent portion left kidney is stable.  No free air.  Status post right total hip arthroplasty causes streak artifact.  Unremarkable uterus and adnexa.  Non-visualized appendix.  No bowel obstruction.  No retroperitoneal hemorrhage. Degenerative changes lumbar spine.  Lung bases show bilateral pulmonary opacities with air bronchograms, greater on the left along with bilateral pleural effusions.  This is worse from priors.  Bilateral pneumonia not excluded.  IMPRESSION: Cirrhosis with extensive ascites.  No evidence for intra-abdominal hemorrhage.  Worsening bilateral lower lobe pulmonary opacities and bilateral pleural effusions could represent acute pneumonia.   Original Report Authenticated By: Elsie Stain, M.D.    Dg Pelvis Portable  01/02/2012  *RADIOLOGY REPORT*  Clinical Data: Dark red emesis.  Fall, left hip  pain.  PORTABLE PELVIS  Comparison: 10/11/2011.  Findings: Single AP view of the pelvis demonstrates a right total hip arthroplasty.  A rectal probe.  No visible left hip fracture or dislocation although a single portable pelvis is insufficient to exclude hip fracture. Degenerative change lumbar spine.  IMPRESSION: No visible left hip fracture although left hip series recommended for further evaluation if there is concern for same. Satisfactory appearing right T [facility] A.   Original Report Authenticated By: Elsie Stain, M.D.    Dg Chest Portable 1 View  01/02/2012  *RADIOLOGY REPORT*  Clinical Data: Pain.  Right central line placement.  Abdominal distention.  Liver failure.  Ascites.  PORTABLE CHEST - 1 VIEW  Comparison: 01/02/2012  Findings: Right jugular vein catheter has been inserted and the tip is at the cavoatrial junction in good position.  The patient has new bilateral pulmonary edema.  IMPRESSION: New bilateral pulmonary edema.  Central line tip appears in good position.   Original Report Authenticated By: Gwynn Burly, M.D.    Dg Chest Portable 1 View  01/02/2012  *RADIOLOGY REPORT*  Clinical Data: Fall  PORTABLE CHEST - 1 VIEW  Comparison: 07/26/2011  Findings: Hypoventilation with decreased lung volume and bibasilar mild atelectasis.  Negative for heart failure.  Chronic left rib fractures are noted and unchanged.  No definite acute fracture.  IMPRESSION: Hypoventilation with mild bibasilar atelectasis.   Original Report Authenticated By: Camelia Phenes, M.D.  Impression:  1. Reported hematemesis, not currently bleeding clinically 2. Drop in hemoglobin, although value may be spurious. Baseline hemoglobin in office 6 weeks ago was 9.7 3. Alcoholic liver disease, apparently abstinent from alcohol recently. Normal protime and platelets noted; main manifestation has been ascites 4. History of distal erosive esophagitis in June 2013 5. Severe ascites 6. Hypotension, leukocytosis, possible  left lower lobe pneumonia    Plan:  supportive care with octreotide and Protonix infusions and consideration of endoscopy when the patient is more stable, or if she shows signs of active, acute, destabilizing bleeding   LOS: 0 days   Alakai Macbride V  01/02/2012, 11:44 PM

## 2012-01-02 NOTE — ED Notes (Signed)
Pt remains awake, alert; ccm showing rate 125; xray in to perform portable films

## 2012-01-02 NOTE — Progress Notes (Signed)
ANTIBIOTIC CONSULT NOTE - INITIAL  Pharmacy Consult for Vancomycin/Zosyn Indication: rule out sepsis  No Known Allergies  Patient Measurements:   Vital Signs: Temp: 98.1 F (36.7 C) (09/16 2215) Temp src: Core (Comment) (09/16 1923) BP: 114/73 mmHg (09/16 2128) Pulse Rate: 89  (09/16 2128) Intake/Output from previous day:   Intake/Output from this shift: Total I/O In: 1350 [I.V.:1350] Out: -   Labs:  [facility] 01/02/12 2051 01/02/12 1805  WBC -- 22.0*  HGB 6.1* 9.5*  PLT -- 288  LABCREA -- --  CREATININE 1.60* 1.71*   The CrCl is unknown because both a height and weight (above a minimum accepted value) are required for this calculation. No results found for this basename: VANCOTROUGH:2,VANCOPEAK:2,VANCORANDOM:2,GENTTROUGH:2,GENTPEAK:2,GENTRANDOM:2,TOBRATROUGH:2,TOBRAPEAK:2,TOBRARND:2,AMIKACINPEAK:2,AMIKACINTROU:2,AMIKACIN:2, in the last 72 hours   Microbiology: No results found for this or any previous visit (from the past 720 hour(s)).  Medical History: Past Medical History  Diagnosis Date  . Hypertension   . Osteoporosis   . Alcoholic liver disease   . High cholesterol   . Peripheral vascular disease   . Shortness of breath 09/22/11    "because of all the fluid that's built up in my stomach"  . Ascites 09/22/11    "first time for me"  . Anemia   . History of blood transfusion 07/2011    "when I had elbow surgery"  . Arthritis     "all over"  . Dysrhythmia     "irregular"     Assessment: 61 yo female s/p fall, profoundly hypotensive with coffee ground emesis to start vancomycin/zosyn per pharmacy for empiric coverage/sepsis. Afebrile, WBC elevated at 22.0, blood cultures and urine culture pending. SCr trending down at 1.50, CrCl ~29 ml/min  Pt received Zosyn 3.375 g IV x1 at 1843 and vancomycin 1g IV x1 at 1859.   Goal of Therapy:  Vancomycin trough level 15-20 mcg/ml  Plan:  1. Vancomycin 500 mg IV q24h 2. Zosyn 3.375 g IV q8h - extended interval 3.  Follow up renal function, culture results, clinical progress, and vancomycin trough at steady state if clinically indicated   Crist Fat L 01/02/2012,10:26 PM

## 2012-01-02 NOTE — Procedures (Signed)
Arterial Catheter Insertion Procedure Note Kathryn [name] [id number] 19-Mar-1951  Procedure: Insertion of Arterial Catheter  Indications: Blood pressure monitoring and Frequent blood sampling  Procedure Details Consent: Unable to obtain consent because of altered level of consciousness. Time Out: Verified patient identification, verified procedure, site/side was marked, verified correct patient position, special equipment/implants available, medications/allergies/relevent history reviewed, required imaging and test results available.  Performed  Maximum sterile technique was used including antiseptics, cap, gloves, gown, hand hygiene and sheet. Skin prep: Chlorhexidine; local anesthetic administered 22 gauge catheter was inserted into left radial artery using the Seldinger technique.  Evaluation Blood flow good; BP tracing good. Complications: No apparent complications.   Kathryn [name] Larita Fife 01/02/2012

## 2012-01-02 NOTE — ED Notes (Signed)
EDP aware of current BP - in to assess

## 2012-01-02 NOTE — ED Notes (Signed)
Pt vomited large amount of dark red emesis; gastrocult positive

## 2012-01-02 NOTE — ED Notes (Signed)
Pt again vomited copious amounts of dark red emesis

## 2012-01-03 ENCOUNTER — Inpatient Hospital Stay (HOSPITAL_COMMUNITY): Payer: Medicare Other

## 2012-01-03 DIAGNOSIS — I059 Rheumatic mitral valve disease, unspecified: Secondary | ICD-10-CM

## 2012-01-03 DIAGNOSIS — I219 Acute myocardial infarction, unspecified: Secondary | ICD-10-CM | POA: Diagnosis not present

## 2012-01-03 DIAGNOSIS — J9600 Acute respiratory failure, unspecified whether with hypoxia or hypercapnia: Secondary | ICD-10-CM

## 2012-01-03 DIAGNOSIS — R578 Other shock: Secondary | ICD-10-CM

## 2012-01-03 DIAGNOSIS — K859 Acute pancreatitis without necrosis or infection, unspecified: Secondary | ICD-10-CM

## 2012-01-03 DIAGNOSIS — D62 Acute posthemorrhagic anemia: Secondary | ICD-10-CM

## 2012-01-03 LAB — CBC
HCT: 34.2 % — ABNORMAL LOW (ref 36.0–46.0)
Hemoglobin: 12 g/dL (ref 12.0–15.0)
MCV: 94.5 fL (ref 78.0–100.0)
RBC: 3.62 MIL/uL — ABNORMAL LOW (ref 3.87–5.11)
WBC: 33.2 10*3/uL — ABNORMAL HIGH (ref 4.0–10.5)

## 2012-01-03 LAB — BASIC METABOLIC PANEL
CO2: 21 mEq/L (ref 19–32)
Chloride: 100 mEq/L (ref 96–112)
Creatinine, Ser: 1.63 mg/dL — ABNORMAL HIGH (ref 0.50–1.10)
Potassium: 4.3 mEq/L (ref 3.5–5.1)
Sodium: 133 mEq/L — ABNORMAL LOW (ref 135–145)

## 2012-01-03 LAB — URINE CULTURE: Colony Count: NO GROWTH

## 2012-01-03 LAB — GLUCOSE, CAPILLARY
Glucose-Capillary: 109 mg/dL — ABNORMAL HIGH (ref 70–99)
Glucose-Capillary: 131 mg/dL — ABNORMAL HIGH (ref 70–99)
Glucose-Capillary: 140 mg/dL — ABNORMAL HIGH (ref 70–99)
Glucose-Capillary: 150 mg/dL — ABNORMAL HIGH (ref 70–99)

## 2012-01-03 LAB — POCT I-STAT 3, ART BLOOD GAS (G3+)
Bicarbonate: 21.5 mEq/L (ref 20.0–24.0)
Bicarbonate: 24 mEq/L (ref 20.0–24.0)
TCO2: 25 mmol/L (ref 0–100)
pCO2 arterial: 30.5 mmHg — ABNORMAL LOW (ref 35.0–45.0)
pCO2 arterial: 35.7 mmHg (ref 35.0–45.0)
pH, Arterial: 7.455 — ABNORMAL HIGH (ref 7.350–7.450)
pH, Arterial: 7.434 (ref 7.350–7.450)
pO2, Arterial: 78 mmHg — ABNORMAL LOW (ref 80.0–100.0)
pO2, Arterial: 94 mmHg (ref 80.0–100.0)

## 2012-01-03 LAB — BODY FLUID CELL COUNT WITH DIFFERENTIAL
Eos, Fluid: 0 %
Lymphs, Fluid: 5 %
Monocyte-Macrophage-Serous Fluid: 38 % — ABNORMAL LOW (ref 50–90)
Neutrophil Count, Fluid: 57 % — ABNORMAL HIGH (ref 0–25)
Total Nucleated Cell Count, Fluid: 69 cu mm (ref 0–1000)

## 2012-01-03 LAB — MAGNESIUM: Magnesium: 2.3 mg/dL (ref 1.5–2.5)

## 2012-01-03 LAB — TROPONIN I
Troponin I: >20 ng/mL (ref <0.30)
Troponin I: 18.98 ng/mL (ref <0.30)
Troponin I: 13.14 ng/mL (ref <0.30)

## 2012-01-03 LAB — CK TOTAL AND CKMB (NOT AT ARMC)
CK, MB: 55.8 ng/mL (ref 0.3–4.0)
Relative Index: 24.8 — ABNORMAL HIGH (ref 0.0–2.5)
Total CK: 202 U/L — ABNORMAL HIGH (ref 7–177)

## 2012-01-03 LAB — LACTATE DEHYDROGENASE, PLEURAL OR PERITONEAL FLUID

## 2012-01-03 LAB — LACTIC ACID, PLASMA: Lactic Acid, Venous: 1.4 mmol/L (ref 0.5–2.2)

## 2012-01-03 LAB — MRSA PCR SCREENING: MRSA by PCR: NEGATIVE

## 2012-01-03 MED ORDER — POTASSIUM CHLORIDE 10 MEQ/50ML IV SOLN
INTRAVENOUS | Status: AC
  Administered 2012-01-03: 10 meq
  Filled 2012-01-03: qty 100

## 2012-01-03 MED ORDER — POTASSIUM CHLORIDE 10 MEQ/100ML IV SOLN: 10.0000 meq | INTRAVENOUS | Status: AC

## 2012-01-03 MED ORDER — PHENYLEPHRINE HCL 10 MG/ML IJ SOLN
30.0000 ug/min | INTRAVENOUS | Status: DC
  Filled 2012-01-03: qty 1

## 2012-01-03 MED ORDER — ASPIRIN 300 MG RE SUPP
150.0000 mg | Freq: Once | RECTAL | Status: AC
  Administered 2012-01-03: 150 mg via RECTAL
  Filled 2012-01-03: qty 1

## 2012-01-03 MED ORDER — PHENYLEPHRINE HCL 10 MG/ML IJ SOLN
30.0000 ug/min | INTRAVENOUS | Status: DC
  Administered 2012-01-03: 100 ug/min via INTRAVENOUS
  Administered 2012-01-04: 90 ug/min via INTRAVENOUS
  Filled 2012-01-03 (×6): qty 4

## 2012-01-03 MED ORDER — MAGNESIUM SULFATE 40 MG/ML IJ SOLN
2.0000 g | Freq: Once | INTRAMUSCULAR | Status: AC
  Administered 2012-01-03: 2 g via INTRAVENOUS
  Filled 2012-01-03: qty 50

## 2012-01-03 MED ORDER — NOREPINEPHRINE BITARTRATE 1 MG/ML IJ SOLN
2.0000 ug/min | INTRAMUSCULAR | Status: DC
  Administered 2012-01-03: 30 ug/min via INTRAVENOUS
  Filled 2012-01-03 (×2): qty 16

## 2012-01-03 MED ORDER — INSULIN ASPART 100 UNIT/ML ~~LOC~~ SOLN
0.0000 [IU] | SUBCUTANEOUS | Status: DC
  Administered 2012-01-03: 2 [IU] via SUBCUTANEOUS
  Administered 2012-01-03: 3 [IU] via SUBCUTANEOUS
  Administered 2012-01-03: 2 [IU] via SUBCUTANEOUS
  Administered 2012-01-03: 3 [IU] via SUBCUTANEOUS
  Administered 2012-01-04 – 2012-01-05 (×4): 2 [IU] via SUBCUTANEOUS
  Administered 2012-01-05 – 2012-01-06 (×2): 3 [IU] via SUBCUTANEOUS
  Administered 2012-01-06: 2 [IU] via SUBCUTANEOUS
  Administered 2012-01-06: 3 [IU] via SUBCUTANEOUS
  Administered 2012-01-06 – 2012-01-07 (×2): 2 [IU] via SUBCUTANEOUS
  Administered 2012-01-07: 3 [IU] via SUBCUTANEOUS

## 2012-01-03 MED ORDER — SODIUM CHLORIDE 0.9 % IV SOLN
INTRAVENOUS | Status: DC
  Administered 2012-01-03 – 2012-01-07 (×2): via INTRAVENOUS
  Administered 2012-01-10: 20 mL/h via INTRAVENOUS

## 2012-01-03 MED ORDER — CHLORHEXIDINE GLUCONATE 0.12 % MT SOLN
15.0000 mL | Freq: Two times a day (BID) | OROMUCOSAL | Status: DC
  Administered 2012-01-03 – 2012-01-06 (×7): 15 mL via OROMUCOSAL
  Filled 2012-01-03 (×7): qty 15

## 2012-01-03 MED ORDER — BIOTENE DRY MOUTH MT LIQD
15.0000 mL | Freq: Four times a day (QID) | OROMUCOSAL | Status: DC
  Administered 2012-01-03 – 2012-01-06 (×13): 15 mL via OROMUCOSAL

## 2012-01-03 NOTE — ED Provider Notes (Signed)
Medical screening examination/treatment/procedure(s) were performed by non-physician practitioner and as supervising physician I was immediately available for consultation/collaboration.   Celene Kras, MD 01/03/12 2024

## 2012-01-03 NOTE — Progress Notes (Signed)
  Echocardiogram 2D Echocardiogram has been performed.  Kathryn [name] FRANCES 01/03/2012, 4:38 PM

## 2012-01-03 NOTE — Consult Note (Signed)
WOC consult Note Reason for Consult: eval foot wound of the left plantar foot.  Pt intubated and sedated.  She has scarring of this foot that indicates foot surgeries and bunionectomy.  She does have some deformity of the bone structures of this foot and the wound presents typical neuropathic foot ulcer, she may have some autonomic and neuropathic foot changes.  Wound type:neuropathic foot ulcer Measurement:1.0cm x 1.0cm x 2.0cm, able to probe deeper into wound, which would be concerning for osteomyelitis. Wound bed: pink and moist, with minimal yellow slough Drainage (amount, consistency, odor) moderate, serosanguineous  Periwound: intact without s/s of infection, some mild hyperkeratosis of the periwound Dressing procedure/placement/frequency: packed wound with strip of gauze. Will order daily packing and cleaning with normal saline.  Cover with dry dressing, and wrap with kerlix.  Would recommend follow up at some point during hospitalization with MRI of this foot to rule out osteomyelitis.    Re consult if needed, will not follow at this time. Thanks  Bryanda Mikel [employer], CWOCN [phone])

## 2012-01-03 NOTE — Progress Notes (Signed)
eLink Physician-Brief Progress Note Patient Name: Kathryn [name] DOB: March 01, 1951 MRN: [id number]  Date of Service  01/03/2012   HPI/Events of Note  Patient with 7 beat run of VT.  Has mag level of 1.1 and K of 3.7 in the setting of labs suggestive of renal insuffiencey in the setting of cirrhosis (BUN/Creat of 24/1.5)   eICU Interventions  Plan: 2 gms mag sulfate 20 mEq KCL IV   Intervention Category Intermediate Interventions: Electrolyte abnormality - evaluation and management;Arrhythmia - evaluation and management  Kasi Lasky 01/03/2012, 1:56 AM

## 2012-01-03 NOTE — Progress Notes (Signed)
Clinical Social Work Department BRIEF PSYCHOSOCIAL ASSESSMENT 01/03/2012  Patient:  Kathryn [name], Kathryn [name]     Account Number:  [id number]     Admit date:  01/02/2012  Clinical Social Worker:  Margaree Mackintosh  Date/Time:  01/03/2012 10:30 AM  Referred by:  CSW  Date Referred:  01/03/2012 Referred for  Crisis Intervention   Other Referral:   Interview type:  Family Other interview type:   Spouse: David    PSYCHOSOCIAL DATA Living Status:  FAMILY Admitted from facility:   Level of care:   Primary support name:  Onalee Hua: [phone] Primary support relationship to patient:  SPOUSE Degree of support available:   Adequate.    CURRENT CONCERNS Current Concerns  Other - See comment   Other Concerns:   Crisis Intervention.    SOCIAL WORK ASSESSMENT / PLAN Clinical Social Worker was approached by pt's spouse. Spouse appeared agiated: voice was raised, body language was assertive, was easiliy frustrated.  CSW introduced self and inquired how spouse could be assisted.  Spouse stated he was frustrated that he had not spoke with an Charity fundraiser or MD yet.  CSW validated spouse's feeling.  CSW provided support.  CSW provided a cup of ice water to spouse and offered to sit with spouse until MD arrived for bedside rounds.  CSW provided opportunity for spouse to process feelings.  CSW utilized active listening.  CSW validated spouse's feelings.  Spouse thanked CSW for intervention, MD and RN arrived at bedside.   Assessment/plan status:  Psychosocial Support/Ongoing Assessment of Needs Other assessment/ plan:   Information/referral to community resources:   Hospital waiting areas.    PATIENT'S/FAMILY'S RESPONSE TO PLAN OF CARE: Pt currently intubated and sedated.  Spouse was receptive to CSW intervention.  Spouse thanked CSW for intervention.        Angelia Mould, MSW, [place] [phone]

## 2012-01-03 NOTE — Progress Notes (Signed)
CRITICAL VALUE ALERT  Critical value received:  Troponin > 20  Date of notification:  01/03/12  Time of notification:  1200  Critical value read back:yes  Nurse who received alert:  D. Robb Matar  MD notified (1st page):  Tyson Alias  Time of first page:  1205  MD notified (2nd page):  Time of second page:  Responding MD:  feinstein  Time MD responded:  1215

## 2012-01-03 NOTE — Progress Notes (Signed)
Name: Kathryn [name] MRN: [id number] DOB: 1950/05/03    LOS: 1  Referring Provider:  EDP Reason for Referral:  Shock  PULMONARY / CRITICAL CARE MEDICINE  The patient is encephalopathic and unable to provide history, which was obtained for available medical records.  HPI:  61 yo with alcoholic liver disease presenting to [facility] ED s/p fall, found to be profoundly hypotensive, with coffee ground emesis.   Brief patient description:  61 yo with alcoholic liver disease presenting to [facility] ED s/p fall, found to be profoundly hypotensive, with coffee ground emesis.  Events Since Admission: 9/16 >>> Presenting to [facility] ED s/p fall, found to be profoundly hypotensive, with coffee ground emesis. 9/17 >>> Intubated for ARF  Current Status: Critical on pressors, hgb improved  Vital Signs: Temp:  [91.4 F (33 C)-98.3 F (36.8 C)] 98 F (36.7 C) (09/17 0801) Pulse Rate:  [39-164] 104  (09/17 0801) Resp:  [18-34] 18  (09/17 0801) BP: (41-114)/(23-84) 114/83 mmHg (09/17 0801) SpO2:  [66 %-100 %] 97 % (09/17 0801) Arterial Line BP: (56-145)/(36-104) 114/83 mmHg (09/17 0800) FiO2 (%):  [69.8 %-100 %] 70 % (09/17 0801) Weight:  [53.1 kg (117 lb 1 oz)] 53.1 kg (117 lb 1 oz) (09/17 0000)  Physical Examination: General:  Appears greater than age and acutely ill Neuro:  Sedated, intubated, cough / gag present HEENT:  PERRL, dry membranes, intubated Neck:  No JVD, Right CVC in place Cardiovascular:  Regular rhythm, rapid rate, s1 s2, pulses weak in all ext. Lungs:  Clear bilaterally in upper lobes, very diminished in bilateral bases. Abdomen:  Distended but soft, mild generalized tenderness, coffee ground emesis noted to OG Musculoskeletal:  Moves all extremities Skin:  .5 cm x .5 cm wound noted to left plantar surface of the foot  Active Problems:  Lactic acidosis  Septic shock  Fall  Acute renal failure  Coffee ground emesis  Hemorrhagic shock  Acute respiratory failure  ASSESSMENT AND  PLAN  PULMONARY  Lab 01/03/12 0500 01/02/12 2320 01/02/12 2050  PHART 7.455* 7.390 7.470*  PCO2ART 30.5* 37.3 25.8*  PO2ART 78.0* 107.0* 78.0*  HCO3 21.5 22.5 18.9*  O2SAT 96.0 98.0 97.0    Ventilator Settings: Vent Mode:  [-] PRVC FiO2 (%):  [69.8 %-100 %] 70 % Set Rate:  [18 bmp] 18 bmp Vt Set:  [380 mL] 380 mL PEEP:  [5 cmH20] 5 cmH20 Plateau Pressure:  [18 cmH20-22 cmH20] 18 cmH20 CXR:  9/16 >>> R IJ TLC in place, small volumes, bilateral small effusions, no overt infiltrates ETT:  9/17 >>>  A:  Acute Respiratory Failure with respiratory alkalosis mild, ARDS , meets criteria likely P:   Continue vent support, add ards protocol, limited reduction in rate as Tv reduced to 7 cc/kg Recheck ABG to follow Supplemental oxygen Fentanyl  If rr greater 24, increase pcxr in am   CARDIOVASCULAR  Lab 01/03/12 0349 01/02/12 2200 01/02/12 1748  TROPONINI -- -- --  LATICACIDVEN 1.4 5.1* 4.6*  PROBNP -- -- --   ECG:  9/16 >>> Sinus tachycardia, nonspecific ST-T changes Lines: R IJ TLC 9/16 >>>             L Radial Aline 9/16 >>>  A: shock (septic vs hemorrhagic).      Ventricular Arrythmia P:  Goal MAP > 60 Lactic acid clearing Wean levophed r/t arrythmias, add additional agents neo to limit levo ectopy Continue vasopressin gtt, if variceal bleeding suspicion increased would increase to 0.4 U/min cvp 12-15 Stress  roids to continue Trop, ecg Echo assessment  RENAL  Lab 01/03/12 0448 01/02/12 2333 01/02/12 2225 01/02/12 2051 01/02/12 1805  NA 133* -- 139 137 135  K 4.3 -- 3.7 -- --  CL 100 -- 106 104 91*  CO2 21 -- -- -- 28  BUN 27* -- 24* 30* 30*  CREATININE 1.63* -- 1.50* 1.60* 1.71*  CALCIUM 8.8 -- -- -- 11.4*  MG 2.3 1.1* -- -- --  PHOS 3.8 4.8* -- -- --   Intake/Output      09/16 0701 - 09/17 0700 09/17 0701 - 09/18 0700   I.V. (mL/kg) 3468.4 (65.3) 116.1 (2.2)   Other 10    IV Piggyback 100    Total Intake(mL/kg) 3578.4 (67.4) 116.1 (2.2)   Urine  (mL/kg/hr) 165 (0.1)    Total Output 165    Net +3413.4 +116.1         Foley:  9/16 >>>  A:  AKI.  Hypovolemia.       Electrolyte disturbance P:   Goal CVP 10 to12, at goal, assess pulm pressures NS boluses PRN for gola CVP Avoid IV contrast r/t AKI Recheck BMP and replace electrolytes with caution in am   GASTROINTESTINAL  Lab 01/02/12 1805  AST 37  ALT 9  ALKPHOS 248*  BILITOT 1.4*  PROT 5.9*  ALBUMIN 1.7*   A:  Alcoholic cirrhosis.  Suspected upper GI hemorrhage.  Possible variceal hemorrhage.  Possible SBP.  Possible intraabdominal hemorrhage. P:   NPO maintain Octreotide gtt Protonix gtt GI consulted with no indication for emergent intervention, rec following cbc, agree  HEMATOLOGIC  Lab 01/03/12 0448 01/02/12 2333 01/02/12 2225 01/02/12 2051 01/02/12 1805  HGB 12.0 11.7* 11.6* 6.1* 9.5*  HCT 34.2* 34.0* 34.0* 18.0* 27.5*  PLT 254 229 -- -- 288  INR -- -- -- -- 1.19  APTT -- -- -- -- 28   A:  Acute anemia, source unclear. Hb 10-->6 P:  Follow CBC q6h Transfuse for hgb <7 or clinically bleeding coags in am   INFECTIOUS  Lab 01/03/12 0448 01/02/12 2333 01/02/12 2205 01/02/12 1805  WBC 33.2* 27.9* -- 22.0*  PROCALCITON -- -- 1.63 --   Cultures: 9/16  Blood >>> 9/16  Urine >>> Antibiotics: Vancomycin 9/16 >>> Zosym 9/16 >>>  A:  Septic shock. R/o PAn as source, r/o SBP      Wound infection with tunneling. Concern for osteo? P:   Antibiotic/cultures as above Possible imaging of the left foot. Recent tap abdo, at risk SBP Consider non therapeutic para ( shock), consider diagnostic  ENDOCRINE  Lab 01/03/12 0731 01/03/12 0350 01/02/12 2348  GLUCAP 131* 184* 140*    A:  Presumed relative adrenal insufficiency.      Hyperglycemia P:  Stress dose steroids with hydrocortisone       SSI, monitor on octreotide  NEUROLOGIC  A:  Acute encephalopathy (septic, metabolic).   P:   Continuous sedation with fentanyl Dc versed drip, in setting  cirhosis Add folic acid and thiamine Low threshold lactulose when bleeding concerns discovered etiology  BEST PRACTICE / DISPOSITION Level of Care:  ICU Primary Service:  PCCM Consultants:   GI Code Status:  Full Diet:  NPO DVT Px:  SCDs GI Px:  Protonix gtt Skin Integrity: Left foot wound, wound care consult Social / Family:  updated   Rosebrock, Pearletha Furl, NP student Pulmonary and Critical Care Medicine [facility] Pager: [phone]  01/03/2012, 9:35 AM  I have fully examined this patient  and agree with above findings.    And edited infull  Ccm time 40 min   Mcarthur Rossetti. Tyson Alias, MD, FACP Pgr: [phone] [place] [facility]

## 2012-01-03 NOTE — Procedures (Signed)
Name: Kathryn [name] MRN: [id number] DOB: 1950-05-14  PROCEDURE NOTE  Procedure:  Endotracheal intubation.  Indication:  Acute respiratory failure  Consent:  Consent was implied due to the emergency nature of the procedure.  Anesthesia:  A total of 10 mg of Etomidate was given intravenously.  Procedure summary:  Appropriate equipment was assembled. The patient was identified as Kathryn [name] and safety timeout was performed. The patient was placed supine, with head in sniffing position. After adequate level of anesthesia was achieved, a Mac 3 blade was inserted into the oropharynx and the vocal cords were visualized. A 7.5 endotracheal tube was inserted without difficulty and visualized going through the vocal cords. The stylette was removed and cuff inflated. Colorimetric change was noted on the CO2 meter. Breath sounds were heard over both lung fields equally. Post procedure chest xray was ordered.  Complications:  No immediate complications were noted.  Hemodynamic parameters and oxygenation remained stable throughout the procedure.    Orlean Bradford, M.D. Pulmonary and Critical Care Medicine [facility] Cell: [phone] Pager: [phone]  01/03/2012, 4:11 AM

## 2012-01-03 NOTE — Procedures (Signed)
Paracentesis Procedure Note  Pre-operative Diagnosis: Acholic Cirrhosis with septic shock   Post-operative Diagnosis: same  Indications: Peritoneal fluid collection  Procedure Details  Consent: Informed consent was obtained from the husband. Risks of the procedure were discussed including: infection, bleeding, pain, bowel perferation.  Under sterile conditions the patient was positioned.  Fluid collection was visualized under real time ultrasound. CHG solution and sterile drapes were utilized.  1% plain lidocaine was used to anesthetize the left lower quadrant of the abdomen. Fluid was obtained without any difficulties and no blood loss.  A dressing was applied to the wound and wound care instructions were provided.   Findings 60 ml of clear yellow peritoneal fluid was obtained. A sample was sent to Pathology for cell counts, culture and gram stain, and LDH  Complications:  None; patient tolerated the procedure well.          Condition: stable  Plan Follow cultures  Attending Attestation: I was present for the entire procedure.  Richard A. Rosebrock, NP Student  Tolerate d well US guidance  Mcarthur Rossetti. Tyson Alias, MD, FACP Pgr: [phone] [place] [facility]

## 2012-01-03 NOTE — Significant Event (Signed)
Made Dr. Darrick Penna aware of HGB of 11.7. Orders to hold off on transfusing at this time.

## 2012-01-03 NOTE — Progress Notes (Signed)
Updated Dr. Manson Passey regarding pt's minimal output. Will continue to monitor at this time.

## 2012-01-03 NOTE — Progress Notes (Signed)
CRITICAL VALUE ALERT  Critical value received:  CKMB 84.5 Troponin 18.98  Date of notification:  01/03/12  Time of notification:  1631  Critical value read back:yes  Nurse who received alert:  D. Robb Matar  MD notified (1st page):  Dr. Bard Herbert  Time of first page:  1631  MD notified (2nd page):  Time of second page:  Responding MD:  Dr. Bard Herbert   Time MD responded:  [phone]

## 2012-01-03 NOTE — Progress Notes (Signed)
[facility] PROGRESS NOTE Subjective Patient followed by Dr. Dulce Sellar with alcoholic cirrhosis. Recent EGD 6/13 showed no varices esophagitis. Ascites has been her primary problem requiring paracentesis. Because of elevated creatinine, diuretics have been less useful. Information has been sent to do about transplant evaluation I am not sure that that has been followed up on yet.patient admitted from the emergency room what appears to be septic shock. She is intubated and sedated on the ventilator with octreotide, Levothroid, vasopressin, Protonix infusions. She has had some coffee-ground emesis but brown stool from below. Hemoglobin has come up nicely with transfusion.CT scan from the emergency room showed extensive ascites, possible pneumonia. Current NG drainage greenish to brownish with no bright blood.  Objective: Vital signs in last 24 hours: Temp:  [91.4 F (33 C)-98.3 F (36.8 C)] 97.6 F (36.4 C) (09/17 1126) Pulse Rate:  [39-164] 92  (09/17 1126) Resp:  [18-34] 20  (09/17 1126) BP: (41-114)/(23-84) 82/59 mmHg (09/17 1126) SpO2:  [66 %-100 %] 99 % (09/17 1126) Arterial Line BP: (56-145)/(36-104) 114/83 mmHg (09/17 0800) FiO2 (%):  [50 %-100 %] 50 % (09/17 1126) Weight:  [53.1 kg (117 lb 1 oz)] 53.1 kg (117 lb 1 oz) (09/17 0000)    Intake/Output from previous day: 09/16 0701 - 09/17 0700 In: 3578.4 [I.V.:3468.4; IV Piggyback:100] Out: 165 [Urine:165] Intake/Output this shift: Total I/O In: 116.1 [I.V.:116.1] Out: -   PE: Gen.-patient intubated on the ventilator and heavily sedated and unresponsive Abdomen-distended probable ascites few bowel sounds no gross tenderness.  Lab Results:  Basename 01/03/12 0448 01/02/12 2333 01/02/12 2225 01/02/12 2051 01/02/12 1805  WBC 33.2* 27.9* -- -- 22.0*  HGB 12.0 11.7* 11.6* 6.1* 9.5*  HCT 34.2* 34.0* 34.0* 18.0* 27.5*  PLT 254 229 -- -- 288   BMET  Basename 01/03/12 0448 01/02/12 2225 01/02/12 2051 01/02/12 1805  NA 133*  139 137 135  K 4.3 3.7 4.0 3.8  CL 100 106 104 91*  CO2 21 -- -- 28  CREATININE 1.63* 1.50* 1.60* 1.71*   LFT  Basename 01/02/12 1805  PROT 5.9*  AST 37  ALT 9  ALKPHOS 248*  BILITOT 1.4*  BILIDIR --  IBILI --   PT/INR  Basename 01/02/12 1805  LABPROT 15.4*  INR 1.19   PANCREAS No results found for this basename: LIPASE:3 in the last 72 hours       Studies/Results: Ct Abdomen Pelvis Wo Contrast  01/02/2012  *RADIOLOGY REPORT*  Clinical Data: Suspected abdominal sepsis.  Hypotension.  CT ABDOMEN AND PELVIS WITHOUT CONTRAST  Technique:  Multidetector CT imaging of the abdomen and pelvis was performed following the standard protocol without intravenous contrast.  Comparison: 10/11/2011.  Findings: Again noted changes of hepatic cirrhosis with enlarged lateral segment left lobe and atrophy of the right lobe.  Normal spleen.  Gallbladder sludge.  Extensive free fluid throughout the abdomen similar attenuation to priors, consistent with ascites, not hemoperitoneum.  Normal pancreas, adrenal glands, abdominal aorta, and retroperitoneal lymph nodes.  Nondilated renal collecting systems.  4 mm stone dependent portion left kidney is stable.  No free air.  Status post right total hip arthroplasty causes streak artifact.  Unremarkable uterus and adnexa.  Non-visualized appendix.  No bowel obstruction.  No retroperitoneal hemorrhage. Degenerative changes lumbar spine.  Lung bases show bilateral pulmonary opacities with air bronchograms, greater on the left along with bilateral pleural effusions.  This is worse from priors.  Bilateral pneumonia not excluded.  IMPRESSION: Cirrhosis with extensive ascites.  No evidence for  intra-abdominal hemorrhage.  Worsening bilateral lower lobe pulmonary opacities and bilateral pleural effusions could represent acute pneumonia.   Original Report Authenticated By: Elsie Stain, M.D.    Dg Pelvis Portable  01/02/2012  *RADIOLOGY REPORT*  Clinical Data: Dark  red emesis.  Fall, left hip pain.  PORTABLE PELVIS  Comparison: 10/11/2011.  Findings: Single AP view of the pelvis demonstrates a right total hip arthroplasty.  A rectal probe.  No visible left hip fracture or dislocation although a single portable pelvis is insufficient to exclude hip fracture. Degenerative change lumbar spine.  IMPRESSION: No visible left hip fracture although left hip series recommended for further evaluation if there is concern for same. Satisfactory appearing right T H A.   Original Report Authenticated By: Elsie Stain, M.D.    Dg Chest Port 1 View  01/03/2012  *RADIOLOGY REPORT*  Clinical Data: Status post intubation.  PORTABLE CHEST - 1 VIEW  Comparison: Chest 01/02/2012 at 2044 hours.  Findings: New endotracheal tube is in place with tip 1.7 cm above the carina.  New NG tube is in good position with the tip in the fundus of the stomach.  Right IJ catheter is again noted. Bilateral airspace disease has progressed.  Heart size is upper normal.  No pneumothorax.  IMPRESSION:  1.  ET tube and NG tube in good position. 2.  Increased airspace disease likely due to pulmonary edema.   Original Report Authenticated By: Bernadene Bell. D'ALESSIO, M.D.    Dg Chest Portable 1 View  01/02/2012  *RADIOLOGY REPORT*  Clinical Data: Pain.  Right central line placement.  Abdominal distention.  Liver failure.  Ascites.  PORTABLE CHEST - 1 VIEW  Comparison: 01/02/2012  Findings: Right jugular vein catheter has been inserted and the tip is at the cavoatrial junction in good position.  The patient has new bilateral pulmonary edema.  IMPRESSION: New bilateral pulmonary edema.  Central line tip appears in good position.   Original Report Authenticated By: Gwynn Burly, M.D.    Dg Chest Portable 1 View  01/02/2012  *RADIOLOGY REPORT*  Clinical Data: Fall  PORTABLE CHEST - 1 VIEW  Comparison: 07/26/2011  Findings: Hypoventilation with decreased lung volume and bibasilar mild atelectasis.  Negative for  heart failure.  Chronic left rib fractures are noted and unchanged.  No definite acute fracture.  IMPRESSION: Hypoventilation with mild bibasilar atelectasis.   Original Report Authenticated By: Camelia Phenes, M.D.    Dg Foot 2 Views Left  01/03/2012  Nelda Bucks, MD     01/03/2012 11:20 AM Paracentesis Procedure Note  Pre-operative Diagnosis: Acholic Cirrhosis with septic shock   Post-operative Diagnosis: same  Indications: Peritoneal fluid collection  Procedure Details  Consent: Informed consent was obtained from the husband. Risks of  the procedure were discussed including: infection, bleeding,  pain, bowel perferation.  Under sterile conditions the patient was positioned.  Fluid  collection was visualized under real time ultrasound. CHG  solution and sterile drapes were utilized.  1% plain lidocaine  was used to anesthetize the left lower quadrant of the abdomen.  Fluid was obtained without any difficulties and no blood loss.  A  dressing was applied to the wound and wound care instructions  were provided.   Findings 60 ml of clear yellow peritoneal fluid was obtained. A sample was  sent to Pathology for cell counts, culture and gram stain, and  LDH  Complications:  None; patient tolerated the procedure well.  Condition: stable  Plan Follow cultures  Attending Attestation: I was present for the entire procedure.  Richard A. Rosebrock, NP Student  Tolerate d well US guidance  Mcarthur Rossetti. Tyson Alias, MD, FACP Pgr: [phone] [place] [facility]         Medications: I have reviewed the patient's current medications.  Assessment/Plan: 1.Probable Septic Shock. Patient on pressors, antibiotics, intubated and sedated 2. Alcoholic cirrhosis with intractable ascites requiring paracentesis 3.GI bleed. Hemoglobin currently stable. Minimal signs of active bleeding the NG tube. EGD recently showing no varices but significant esophagitis.  Recommendation: We will follow with you. Will  hold off on EGD now unless she bleeds actively. Agree with paracentesis and treating her sepsis empirically..     Jalon Squier JR,Akya Fiorello L 01/03/2012, 12:23 PM

## 2012-01-03 NOTE — Consult Note (Signed)
CARDIOLOGY CONSULT NOTE  Patient ID: Kathryn [name], MRN: [id number], DOB/AGE: 1950/05/18 61 y.o. Admit date: 01/02/2012 Date of Consult: 01/03/2012  Primary Physician: Willow Ora, MD Primary Cardiologist: Dr. Antoine Poche   Chief Complaint: fall Reason for Consultation: Elevated troponin in the setting of septic shock   HPI: 61 y.o. female w/ PMHx significant for HLD, HTN, PVD, and Alcoholic liver disease who presented to [facility] on 01/02/2012 after a fall and found to have elevated cardiac enzymes in the setting of septic shock.  No history of coronary artery disease. Echo 2007 showed mild focal septal hypertrophy, but otherwise normal, EF 55-65%, no RWMAs. She was evaluated by Dr. Antoine Poche in 2008 for asymptomatic sinus tachycardia. Holter monitor showed persistent tachycardia with rates ~ 100 that did not demonstrate the normal diurnal variation, but did have respiratory variation and some episodes of a premature ectopic complex bigeminy.  Echo was completed and unchanged from previous one in 2007. Of note her EKG at that time showed sinus rhythm RAD, anterior TWI, mild QT prolongation.  Patient is sedated and intubated. Information obtained from medical record. Patient presented after falling at home and was found to be profoundly hypotensive with reported hematemesis requiring vasopressors and intubation for respiratory failure. She has septic shock unknown etiology, spontaneous bacterial peritonitis vs PNA vs foot wound infection, for which abx have been initiated and cultures obtained. She received transfusion of PRBCs for Hgb 9.5--> 6.1 -->11.6 and evaluated by GI who recommended supportive care as there are no signs of active bleeding. She has had runs of NSVT in the setting of electrolyte abnormalities requiring supplementation. Cardiac enzymes were checked showing troponin >20 in the setting of septic shock and renal failure, Crt 1.71-->1.63. EKG shows NSR 92bpm, low voltage, no acute  ST/T changes. Echo is being performed at bedside.  Past Medical History  Diagnosis Date  . Hypertension   . Osteoporosis   . Alcoholic liver disease   . High cholesterol   . Peripheral vascular disease   . Shortness of breath 09/22/11    "because of all the fluid that's built up in my stomach"  . Ascites 09/22/11    "first time for me"  . Anemia   . History of blood transfusion 07/2011    "when I had elbow surgery"  . Arthritis     "all over"  . Dysrhythmia     "irregular"      Surgical History:  Past Surgical History  Procedure Date  . Fracture surgery   . Amputation     two toes on left foot  . Bunionectomy     both feet  . Arthroscopic repair acl     right knee  . Joint replacement     right hip   . Orif humerus fracture 07/26/2011    Procedure: OPEN REDUCTION INTERNAL FIXATION (ORIF) DISTAL HUMERUS FRACTURE;  Surgeon: Budd Palmer, MD;  Location: [facility] OR;  Service: Orthopedics;  Laterality: Right;  . Total hip arthroplasty ~ 2003    right  . Tubal ligation 1980  . Paracentesis 09/22/11    2.2L  . Esophagogastroduodenoscopy 09/23/2011    Procedure: ESOPHAGOGASTRODUODENOSCOPY (EGD);  Surgeon: Shirley Friar, MD;  Location: [facility] ENDOSCOPY;  Service: Endoscopy;  Laterality: N/A;     Home Meds: Medication Sig  Calcium Carbonate-Vitamin D (CALCIUM + D PO) Take 1,600 mg by mouth daily.  feeding supplement (RESOURCE BREEZE) LIQD Take 1 Container by mouth 3 (three) times daily between meals.  furosemide (  LASIX) 20 MG tablet Take 1 tablet (20 mg total) by mouth daily.  HYDROcodone-acetaminophen (NORCO) 5-325 MG per tablet Take 1 tablet by mouth every 6 (six) hours as needed. For pain  pantoprazole (PROTONIX) 40 MG tablet Take 1 tablet (40 mg total) by mouth 2 (two) times daily before a meal.  spironolactone (ALDACTONE) 100 MG tablet Take 1 tablet (100 mg total) by mouth daily.    Inpatient Medications:   . albumin human  12.5 g Intravenous Once  . antiseptic oral rinse  15  mL Mouth Rinse QID  . chlorhexidine  15 mL Mouth Rinse BID  . etomidate      . fentaNYL  50 mcg Intravenous Once  . hydrocortisone sodium succinate  50 mg Intravenous Q6H  . insulin aspart  0-15 Units Subcutaneous Q4H  . magnesium sulfate 1 - 4 g bolus IVPB  2 g Intravenous Once  . metoCLOPramide (REGLAN) injection  10 mg Intravenous Once  . midazolam      . octreotide  50 mcg Intravenous Once  . ondansetron (ZOFRAN) IV  4 mg Intravenous Once  . ondansetron (ZOFRAN) IV  4 mg Intravenous Once  . pantoprazole (PROTONIX) IV  80 mg Intravenous STAT  . piperacillin-tazobactam  3.375 g Intravenous Once  . piperacillin-tazobactam (ZOSYN)  IV  3.375 g Intravenous Q8H  . potassium chloride  10 mEq Intravenous Q1 Hr x 2  . potassium chloride      . sodium chloride  1,000 mL Intravenous STAT  . sodium chloride  1,000 mL Intravenous Once  . vancomycin  500 mg Intravenous Q24H  . vancomycin  1,000 mg Intravenous Once  . DISCONTD: succinylcholine  20 mg Intravenous Once   . sodium chloride 20 mL/hr at 01/03/12 0000  . fentaNYL infusion INTRAVENOUS 50 mcg/hr (01/02/12 2232)  . norepinephrine (LEVOPHED) Adult infusion 4 mcg/min (01/03/12 1100)  . octreotide (SANDOSTATIN) infusion 50 mcg/hr (01/02/12 2300)  . pantoprozole (PROTONIX) infusion 8 mg/hr (01/03/12 0500)  . phenylephrine (NEO-SYNEPHRINE) Adult infusion 150 mcg/min (01/03/12 1400)  . vasopressin (PITRESSIN) infusion - *FOR SHOCK* 0.03 Units/min (01/02/12 2300)  . DISCONTD: midazolam (VERSED) infusion 4 mg/hr (01/02/12 2300)  . DISCONTD: norepinephrine (LEVOPHED) Adult infusion 15 mcg/min (01/03/12 0300)  . DISCONTD: phenylephrine (NEO-SYNEPHRINE) Adult infusion      Allergies: No Known Allergies  History   Social History  . Marital Status: Married    Spouse Name: N/A    Number of Children: N/A  . Years of Education: N/A   Occupational History  . Not on file.   Social History Main Topics  . Smoking status: Never Smoker   .  Smokeless tobacco: Never Used  . Alcohol Use: 3.6 oz/week    6 Shots of liquor per week     "last drink was day before OR in 07/2011"  . Drug Use: No  . Sexually Active: Not Currently   Other Topics Concern  . Not on file   Social History Narrative  . No narrative on file     Family history: Unable to obtain as pt is sedated and intubated  Review of Systems: Unable to obtain as pt is sedated and intubated  Labs:  Basename 01/03/12 1055  TROPONINI >20.00*   Component Value Date   WBC 33.2* 01/03/2012   HGB 12.0 01/03/2012   HCT 34.2* 01/03/2012   MCV 94.5 01/03/2012   PLT 254 01/03/2012    Lab 01/03/12 0448 01/02/12 1805  NA 133* --  K 4.3 --  CL  100 --  CO2 21 --  BUN 27* --  CREATININE 1.63* --  CALCIUM 8.8 --  PROT -- 5.9*  BILITOT -- 1.4*  ALKPHOS -- 248*  ALT -- 9  AST -- 37  GLUCOSE 200* --     01/02/2012 23:33 01/03/2012 04:48  Magnesium 1.1 (L) 2.3    Radiology/Studies:   01/02/2012  - CT ABDOMEN AND PELVIS WITHOUT CONTRAST   Findings: Again noted changes of hepatic cirrhosis with enlarged lateral segment left lobe and atrophy of the right lobe.  Normal spleen.  Gallbladder sludge.  Extensive free fluid throughout the abdomen similar attenuation to priors, consistent with ascites, not hemoperitoneum.  Normal pancreas, adrenal glands, abdominal aorta, and retroperitoneal lymph nodes.  Nondilated renal collecting systems.  4 mm stone dependent portion left kidney is stable.  No free air.  Status post right total hip arthroplasty causes streak artifact.  Unremarkable uterus and adnexa.  Non-visualized appendix.  No bowel obstruction.  No retroperitoneal hemorrhage. Degenerative changes lumbar spine.  Lung bases show bilateral pulmonary opacities with air bronchograms, greater on the left along with bilateral pleural effusions.  This is worse from priors.  Bilateral pneumonia not excluded.  IMPRESSION: Cirrhosis with extensive ascites.  No evidence for intra-abdominal  hemorrhage.  Worsening bilateral lower lobe pulmonary opacities and bilateral pleural effusions could represent acute pneumonia.       01/02/2012 - PORTABLE PELVIS Findings: Single AP view of the pelvis demonstrates a right total hip arthroplasty.  A rectal probe.  No visible left hip fracture or dislocation although a single portable pelvis is insufficient to exclude hip fracture. Degenerative change lumbar spine.  IMPRESSION: No visible left hip fracture although left hip series recommended for further evaluation if there is concern for same. Satisfactory appearing right T H A.      01/03/2012 - PORTABLE CHEST - 1 VIEW  Findings: New endotracheal tube is in place with tip 1.7 cm above the carina.  New NG tube is in good position with the tip in the fundus of the stomach.  Right IJ catheter is again noted. Bilateral airspace disease has progressed.  Heart size is upper normal.  No pneumothorax.  IMPRESSION:  1.  ET tube and NG tube in good position. 2.  Increased airspace disease likely due to pulmonary edema.   .    01/02/2012 - PORTABLE CHEST - 1 VIEW  Findings: Right jugular vein catheter has been inserted and the tip is at the cavoatrial junction in good position.  The patient has new bilateral pulmonary edema.  IMPRESSION: New bilateral pulmonary edema.  Central line tip appears in good position.     01/02/2012 - PORTABLE CHEST - 1 VIEW   Findings: Hypoventilation with decreased lung volume and bibasilar mild atelectasis.  Negative for heart failure.  Chronic left rib fractures are noted and unchanged.  No definite acute fracture.  IMPRESSION: Hypoventilation with mild bibasilar atelectasis.       EKG: 01/03/12 @ 1439 - NSR 92bpm, low voltage, no acute ST/T changes  Physical Exam:  Blood pressure 88/73, pulse 89, temperature 97.7 F (36.5 C), temperature source Oral, resp. rate 20, height 5' (1.524 m), weight 117 lb 1 oz (53.1 kg), SpO2 99.00%. General:  Middle aged white female, appears older  than stated age, sedated. Head: Normocephalic, atraumatic, sclera icteric, nares are without discharge.  Neck: Supple. JVD not elevated. Right CVC in place Lungs: Mechanical breath sounds anteriorly.  Heart: RRR with S1 S2. No murmurs, rubs, or gallops  appreciated. Abdomen: Distended with hypoactive bowel sounds. No rebound/guarding.  Msk:  Strength and tone appear normal for age. Extremities: Left foot with dry/intact dressing. No clubbing or cyanosis. Trace BLE edema.  Distal pulses weak  Neuro: Intubated and sedated Psych:  Intubated and sedated   Assessment and Plan:  61 y.o. female w/ PMHx significant for HLD, HTN, PVD, and Alcoholic liver disease who presented to [facility] on 01/02/2012 after a fall and found to have elevated cardiac enzymes in the setting of septic shock.  1. Septic shock, unknown etiology 2. Acute Respiratory failure requiring intubation 3. Coffee ground emesis with stable H&H s/p transfusion 4. Acute Renal Failure 5. NSTEMI 6. NSVT  Patient is acutely ill requiring vasopressors, transfusions, and mechanical ventilation. Septic shock of unknown etiology currently undergoing work up by [employer]. No known heart disease, previously normal LV fxn. Now with markedly elevated troponin in the setting of acute illness. EKG with low voltage, but no obvious ischemic ST/T changes. Echo in progress. Also with episodes of NSVT on telemetry.   See MD note below for full assessment and plan.  Signed, HOPE, JESSICA PA-C 01/03/2012, 2:33 PM  History and all data above reviewed.  Patient examined.  I agree with the findings as above.  The patient is intubated and sedated.  She had a NQWMI with her acute illness.  NSVT in the face of this with low magnesium.  The patient exam reveals COR:RRR, no rub  ,  Lungs: Decreased,  Abd: Positive bowel sounds, no rebound no guarding, Ext  No edema, absent DP/PT bilateral.  All available labs, radiology testing, previous records reviewed.  Agree with documented assessment and plan. The patient has had an acute MI.  However, surprisingly her EF and wall motion on echo are preserved.  The RV is slightly enlarged.  This raises the question of pulmonary emboli but I think this is less likely.  Unfortunately, we cannot treat aggressively with ASA or anticoagulation.  Invasive evaluation is not indicated with her other acute issues.  She will not tolerate beta blocker.  Therefore, conservative therapy for now with support and treatment of sepsis anemia.  We will follow.    Fayrene Fearing Doyle Tegethoff  4:52 PM  01/03/2012

## 2012-01-04 ENCOUNTER — Inpatient Hospital Stay (HOSPITAL_COMMUNITY): Payer: Medicare Other

## 2012-01-04 ENCOUNTER — Encounter (HOSPITAL_COMMUNITY): Payer: Self-pay

## 2012-01-04 LAB — CBC WITH DIFFERENTIAL/PLATELET
Basophils Absolute: 0 10*3/uL (ref 0.0–0.1)
Basophils Relative: 0 % (ref 0–1)
Eosinophils Relative: 0 % (ref 0–5)
Lymphocytes Relative: 4 % — ABNORMAL LOW (ref 12–46)
MCHC: 34.4 g/dL (ref 30.0–36.0)
MCV: 96.5 fL (ref 78.0–100.0)
Platelets: 215 10*3/uL (ref 150–400)
RDW: 17.4 % — ABNORMAL HIGH (ref 11.5–15.5)
WBC: 17.5 10*3/uL — ABNORMAL HIGH (ref 4.0–10.5)

## 2012-01-04 LAB — COMPREHENSIVE METABOLIC PANEL
ALT: 16 U/L (ref 0–35)
AST: 94 U/L — ABNORMAL HIGH (ref 0–37)
Albumin: 1.5 g/dL — ABNORMAL LOW (ref 3.5–5.2)
CO2: 22 mEq/L (ref 19–32)
Calcium: 8.3 mg/dL — ABNORMAL LOW (ref 8.4–10.5)
Creatinine, Ser: 2.32 mg/dL — ABNORMAL HIGH (ref 0.50–1.10)
GFR calc non Af Amer: 22 mL/min — ABNORMAL LOW (ref >90)
Sodium: 132 mEq/L — ABNORMAL LOW (ref 135–145)
Total Protein: 5.3 g/dL — ABNORMAL LOW (ref 6.0–8.3)

## 2012-01-04 LAB — GLUCOSE, CAPILLARY
Glucose-Capillary: 107 mg/dL — ABNORMAL HIGH (ref 70–99)
Glucose-Capillary: 112 mg/dL — ABNORMAL HIGH (ref 70–99)
Glucose-Capillary: 115 mg/dL — ABNORMAL HIGH (ref 70–99)
Glucose-Capillary: 117 mg/dL — ABNORMAL HIGH (ref 70–99)
Glucose-Capillary: 124 mg/dL — ABNORMAL HIGH (ref 70–99)
Glucose-Capillary: 140 mg/dL — ABNORMAL HIGH (ref 70–99)

## 2012-01-04 LAB — SODIUM, URINE, RANDOM: Sodium, Ur: 13 mEq/L

## 2012-01-04 MED ORDER — OXEPA PO LIQD
1000.0000 mL | ORAL | Status: DC
  Administered 2012-01-04: 1000 mL

## 2012-01-04 MED ORDER — PRO-STAT SUGAR FREE PO LIQD
30.0000 mL | Freq: Every day | ORAL | Status: DC
  Filled 2012-01-04 (×2): qty 30

## 2012-01-04 MED ORDER — THIAMINE HCL 100 MG/ML IJ SOLN
100.0000 mg | Freq: Every day | INTRAMUSCULAR | Status: DC
  Administered 2012-01-04 – 2012-01-07 (×4): 100 mg via INTRAVENOUS
  Filled 2012-01-04 (×4): qty 1

## 2012-01-04 MED ORDER — PANTOPRAZOLE SODIUM 40 MG IV SOLR
40.0000 mg | Freq: Two times a day (BID) | INTRAVENOUS | Status: DC
  Administered 2012-01-04 – 2012-01-07 (×6): 40 mg via INTRAVENOUS
  Filled 2012-01-04 (×7): qty 40

## 2012-01-04 MED ORDER — SODIUM CHLORIDE 0.9 % IV SOLN: 1.0000 mg | Freq: Every day | INTRAVENOUS | Status: DC

## 2012-01-04 MED ORDER — OXEPA PO LIQD
1000.0000 mL | ORAL | Status: DC
  Filled 2012-01-04 (×2): qty 1000

## 2012-01-04 MED ORDER — FOLIC ACID 5 MG/ML IJ SOLN
1.0000 mg | Freq: Every day | INTRAMUSCULAR | Status: DC
  Administered 2012-01-04 – 2012-01-07 (×4): 1 mg via INTRAVENOUS
  Filled 2012-01-04 (×5): qty 0.2

## 2012-01-04 NOTE — Progress Notes (Signed)
Pt placed back in rest mode due to no RR effort while sleeping.

## 2012-01-04 NOTE — Progress Notes (Signed)
[facility] PROGRESS NOTE Subjective Patient much more alert. She is still intubated at her settings apparently have been turned down and she is responsive. There is no gross bleeding.  Objective: Vital signs in last 24 hours: Temp:  [96.9 F (36.1 C)-97.8 F (36.6 C)] 97 F (36.1 C) (09/18 0900) Pulse Rate:  [65-94] 81  (09/18 1105) Resp:  [18-26] 18  (09/18 1105) BP: (79-100)/(56-80) 89/56 mmHg (09/18 1105) SpO2:  [98 %-100 %] 99 % (09/18 1105) Arterial Line BP: (85-110)/(60-82) 98/69 mmHg (09/18 0900) FiO2 (%):  [40 %-50 %] 40 % (09/18 1105) Weight:  [57.2 kg (126 lb 1.7 oz)] 57.2 kg (126 lb 1.7 oz) (09/18 0500)    Intake/Output from previous day: 09/17 0701 - 09/18 0700 In: 2690.8 [I.V.:2440.8; IV Piggyback:250] Out: 135 [Urine:35; Emesis/NG output:100] Intake/Output this shift: Total I/O In: 220.5 [I.V.:220.5] Out: -   PE: Gen.-patient responsive denies pain, NG tube draining green liquid Abdomen-soft, bowel sounds present, somewhat distended without tenderness  Lab Results:  Basename 01/04/12 0500 01/03/12 0448 01/02/12 2333 01/02/12 2225 01/02/12 2051 01/02/12 1805  WBC 17.5* 33.2* 27.9* -- -- 22.0*  HGB 11.4* 12.0 11.7* 11.6* 6.1* --  HCT 33.1* 34.2* 34.0* 34.0* 18.0* --  PLT 215 254 229 -- -- 288   BMET  Basename 01/04/12 0500 01/03/12 0448 01/02/12 2225 01/02/12 2051 01/02/12 1805  NA 132* 133* 139 137 135  K 3.9 4.3 3.7 4.0 3.8  CL 99 100 106 104 91*  CO2 22 21 -- -- 28  CREATININE 2.32* 1.63* 1.50* 1.60* 1.71*   LFT  Basename 01/04/12 0500 01/02/12 1805  PROT 5.3* 5.9*  AST 94* 37  ALT 16 9  ALKPHOS 162* 248*  BILITOT 1.4* 1.4*  BILIDIR -- --  IBILI -- --   PT/INR  Basename 01/02/12 1805  LABPROT 15.4*  INR 1.19   PANCREAS No results found for this basename: LIPASE:3 in the last 72 hours       Studies/Results: Ct Abdomen Pelvis Wo Contrast  01/02/2012  *RADIOLOGY REPORT*  Clinical Data: Suspected abdominal sepsis.   Hypotension.  CT ABDOMEN AND PELVIS WITHOUT CONTRAST  Technique:  Multidetector CT imaging of the abdomen and pelvis was performed following the standard protocol without intravenous contrast.  Comparison: 10/11/2011.  Findings: Again noted changes of hepatic cirrhosis with enlarged lateral segment left lobe and atrophy of the right lobe.  Normal spleen.  Gallbladder sludge.  Extensive free fluid throughout the abdomen similar attenuation to priors, consistent with ascites, not hemoperitoneum.  Normal pancreas, adrenal glands, abdominal aorta, and retroperitoneal lymph nodes.  Nondilated renal collecting systems.  4 mm stone dependent portion left kidney is stable.  No free air.  Status post right total hip arthroplasty causes streak artifact.  Unremarkable uterus and adnexa.  Non-visualized appendix.  No bowel obstruction.  No retroperitoneal hemorrhage. Degenerative changes lumbar spine.  Lung bases show bilateral pulmonary opacities with air bronchograms, greater on the left along with bilateral pleural effusions.  This is worse from priors.  Bilateral pneumonia not excluded.  IMPRESSION: Cirrhosis with extensive ascites.  No evidence for intra-abdominal hemorrhage.  Worsening bilateral lower lobe pulmonary opacities and bilateral pleural effusions could represent acute pneumonia.   Original Report Authenticated By: Elsie Stain, M.D.    Dg Pelvis Portable  01/02/2012  *RADIOLOGY REPORT*  Clinical Data: Dark red emesis.  Fall, left hip pain.  PORTABLE PELVIS  Comparison: 10/11/2011.  Findings: Single AP view of the pelvis demonstrates a right total hip arthroplasty.  A rectal probe.  No visible left hip fracture or dislocation although a single portable pelvis is insufficient to exclude hip fracture. Degenerative change lumbar spine.  IMPRESSION: No visible left hip fracture although left hip series recommended for further evaluation if there is concern for same. Satisfactory appearing right T H A.    Original Report Authenticated By: Elsie Stain, M.D.    Dg Chest Port 1 View  01/04/2012  *RADIOLOGY REPORT*  Clinical Data: Assess ET tube  PORTABLE CHEST - 1 VIEW  Comparison: 01/02/2012  Findings: Endotracheal tube tip is at the carina, directed toward the right main bronchus.  NG tube descends below the level of the image.  Bilateral pleural effusions and associated consolidations. No definite pneumothorax.  No interval osseous change with multiple posterior left rib fractures that are favored remote. Cardiomediastinal contours are partially obscured by the effusions. May be mild cardiac enlargement and central vascular congestion. Edema suggested though mildly decreased. Right IJ catheter tip projects over the distal SVC.  IMPRESSION: Endotracheal tube tip at the carina, directed toward the right main bronchus, recommend 2-3 cm of retraction.  Prominent cardiac contour with central vascular congestion and mild improvement in the edema pattern.  Bilateral pleural effusions and associate consolidations; atelectasis versus pneumonia, similar to slightly increased.   Original Report Authenticated By: Waneta Martins, M.D.    Dg Chest Port 1 View  01/03/2012  *RADIOLOGY REPORT*  Clinical Data: Status post intubation.  PORTABLE CHEST - 1 VIEW  Comparison: Chest 01/02/2012 at 2044 hours.  Findings: New endotracheal tube is in place with tip 1.7 cm above the carina.  New NG tube is in good position with the tip in the fundus of the stomach.  Right IJ catheter is again noted. Bilateral airspace disease has progressed.  Heart size is upper normal.  No pneumothorax.  IMPRESSION:  1.  ET tube and NG tube in good position. 2.  Increased airspace disease likely due to pulmonary edema.   Original Report Authenticated By: Bernadene Bell. D'ALESSIO, M.D.    Dg Chest Portable 1 View  01/02/2012  *RADIOLOGY REPORT*  Clinical Data: Pain.  Right central line placement.  Abdominal distention.  Liver failure.  Ascites.   PORTABLE CHEST - 1 VIEW  Comparison: 01/02/2012  Findings: Right jugular vein catheter has been inserted and the tip is at the cavoatrial junction in good position.  The patient has new bilateral pulmonary edema.  IMPRESSION: New bilateral pulmonary edema.  Central line tip appears in good position.   Original Report Authenticated By: Gwynn Burly, M.D.    Dg Chest Portable 1 View  01/02/2012  *RADIOLOGY REPORT*  Clinical Data: Fall  PORTABLE CHEST - 1 VIEW  Comparison: 07/26/2011  Findings: Hypoventilation with decreased lung volume and bibasilar mild atelectasis.  Negative for heart failure.  Chronic left rib fractures are noted and unchanged.  No definite acute fracture.  IMPRESSION: Hypoventilation with mild bibasilar atelectasis.   Original Report Authenticated By: Camelia Phenes, M.D.    Dg Foot 2 Views Left  01/03/2012  Nelda Bucks, MD     01/03/2012 11:20 AM Paracentesis Procedure Note  Pre-operative Diagnosis: Acholic Cirrhosis with septic shock   Post-operative Diagnosis: same  Indications: Peritoneal fluid collection  Procedure Details  Consent: Informed consent was obtained from the husband. Risks of  the procedure were discussed including: infection, bleeding,  pain, bowel perferation.  Under sterile conditions the patient was positioned.  Fluid  collection was visualized under real time  ultrasound. CHG  solution and sterile drapes were utilized.  1% plain lidocaine  was used to anesthetize the left lower quadrant of the abdomen.  Fluid was obtained without any difficulties and no blood loss.  A  dressing was applied to the wound and wound care instructions  were provided.   Findings 60 ml of clear yellow peritoneal fluid was obtained. A sample was  sent to Pathology for cell counts, culture and gram stain, and  LDH  Complications:  None; patient tolerated the procedure well.          Condition: stable  Plan Follow cultures  Attending Attestation: I was present for the entire procedure.   Richard A. Rosebrock, NP Student  Tolerate d well US guidance  Mcarthur Rossetti. Tyson Alias, MD, FACP Pgr: [phone] [facility]         Medications: I have reviewed the patient's current medications.  Assessment/Plan: 1.Septic Shock. Appears to be doing better with pressors and antibiotics 2. Alcoholic cirrhosis with intractable ascites 3. GI bleed. NG aspirate clear and hemoglobin stable. Don't feel she needs urgent endoscopy now. When she is extubated and fully awake, we could probably pull  NG tube and see how she does.   Elizabeht Suto JR,Aquilla Voiles L 01/04/2012, 11:53 AM

## 2012-01-04 NOTE — Progress Notes (Signed)
SUBJECTIVE:  She is awake and denies chest pain   PHYSICAL EXAM Filed Vitals:   01/04/12 0500 01/04/12 0525 01/04/12 0600 01/04/12 0700  BP: 95/71 95/71 98/76  90/69  Pulse:  65 74 78  Temp: 97.2 F (36.2 C) 97 F (36.1 C) 97 F (36.1 C) 97 F (36.1 C)  TempSrc: Core (Comment)  Core (Comment) Core (Comment)  Resp: 18 20 20 20   Height:      Weight: 126 lb 1.7 oz (57.2 kg)     SpO2: 100% 100% 100% 99%   General:  Intubated awake Lungs:  Decreased breath sounds Heart:  RRR Abdomen:  Distended with absent bowel sounds Extremities:  Decreased pulses with mild edema.   LABS: Lab Results  Component Value Date   CKTOTAL 202* 01/03/2012   CKMB 55.8* 01/03/2012   TROPONINI 13.14* 01/03/2012   Results for orders placed during the hospital encounter of 01/02/12 (from the past 24 hour(s))  GLUCOSE, CAPILLARY     Status: Abnormal   Collection Time   01/03/12  7:31 AM      Component Value Range   Glucose-Capillary 131 (*) 70 - 99 mg/dL  BODY FLUID CELL COUNT WITH DIFFERENTIAL     Status: Abnormal   Collection Time   01/03/12 10:50 AM      Component Value Range   Fluid Type-FCT PARACENTESIS     Color, Fluid YELLOW  YELLOW   Appearance, Fluid CLEAR  CLEAR   WBC, Fluid 69  0 - 1000 cu mm   Neutrophil Count, Fluid 57 (*) 0 - 25 %   Lymphs, Fluid 5     Monocyte-Macrophage-Serous Fluid 38 (*) 50 - 90 %   Eos, Fluid 0    LACTATE DEHYDROGENASE, BODY FLUID     Status: Abnormal   Collection Time   01/03/12 10:50 AM      Component Value Range   LD, Fluid 57 (*) 3 - 23 U/L   Fluid Type-FLDH PARACENTESIS    TROPONIN I     Status: Abnormal   Collection Time   01/03/12 10:55 AM      Component Value Range   Troponin I >20.00 (*) <0.30 ng/mL  POCT I-STAT 3, BLOOD GAS (G3+)     Status: Normal   Collection Time   01/03/12 11:15 AM      Component Value Range   pH, Arterial 7.434  7.350 - 7.450   pCO2 arterial 35.7  35.0 - 45.0 mmHg   pO2, Arterial 94.0  80.0 - 100.0 mmHg   Bicarbonate  24.0  20.0 - 24.0 mEq/L   TCO2 25  0 - 100 mmol/L   O2 Saturation 98.0     Patient temperature 97.7 F     Collection site ARTERIAL LINE     Drawn by RT     Sample type ARTERIAL    GLUCOSE, CAPILLARY     Status: Abnormal   Collection Time   01/03/12 12:07 PM      Component Value Range   Glucose-Capillary 109 (*) 70 - 99 mg/dL  TROPONIN I     Status: Abnormal   Collection Time   01/03/12  3:15 PM      Component Value Range   Troponin I 18.98 (*) <0.30 ng/mL  CK TOTAL AND CKMB     Status: Abnormal   Collection Time   01/03/12  3:15 PM      Component Value Range   Total CK 341 (*) 7 - 177 U/L  CK, MB 84.5 (*) 0.3 - 4.0 ng/mL   Relative Index 24.8 (*) 0.0 - 2.5  GLUCOSE, CAPILLARY     Status: Abnormal   Collection Time   01/03/12  4:00 PM      Component Value Range   Glucose-Capillary 150 (*) 70 - 99 mg/dL  GLUCOSE, CAPILLARY     Status: Abnormal   Collection Time   01/03/12  8:00 PM      Component Value Range   Glucose-Capillary 160 (*) 70 - 99 mg/dL  TROPONIN I     Status: Abnormal   Collection Time   01/03/12  9:00 PM      Component Value Range   Troponin I 13.14 (*) <0.30 ng/mL  CK TOTAL AND CKMB     Status: Abnormal   Collection Time   01/03/12  9:00 PM      Component Value Range   Total CK 202 (*) 7 - 177 U/L   CK, MB 55.8 (*) 0.3 - 4.0 ng/mL   Relative Index 27.6 (*) 0.0 - 2.5  GLUCOSE, CAPILLARY     Status: Abnormal   Collection Time   01/04/12 12:27 AM      Component Value Range   Glucose-Capillary 140 (*) 70 - 99 mg/dL   Comment 1 Documented in Chart    GLUCOSE, CAPILLARY     Status: Abnormal   Collection Time   01/04/12  4:08 AM      Component Value Range   Glucose-Capillary 124 (*) 70 - 99 mg/dL  CBC WITH DIFFERENTIAL     Status: Abnormal   Collection Time   01/04/12  5:00 AM      Component Value Range   WBC 17.5 (*) 4.0 - 10.5 K/uL   RBC 3.43 (*) 3.87 - 5.11 MIL/uL   Hemoglobin 11.4 (*) 12.0 - 15.0 g/dL   HCT 16.1 (*) 09.6 - 04.5 %   MCV 96.5  78.0 -  100.0 fL   MCH 33.2  26.0 - 34.0 pg   MCHC 34.4  30.0 - 36.0 g/dL   RDW 40.9 (*) 81.1 - 91.4 %   Platelets 215  150 - 400 K/uL   Neutrophils Relative 89 (*) 43 - 77 %   Neutro Abs 15.6 (*) 1.7 - 7.7 K/uL   Lymphocytes Relative 4 (*) 12 - 46 %   Lymphs Abs 0.8  0.7 - 4.0 K/uL   Monocytes Relative 7  3 - 12 %   Monocytes Absolute 1.2 (*) 0.1 - 1.0 K/uL   Eosinophils Relative 0  0 - 5 %   Eosinophils Absolute 0.0  0.0 - 0.7 K/uL   Basophils Relative 0  0 - 1 %   Basophils Absolute 0.0  0.0 - 0.1 K/uL  COMPREHENSIVE METABOLIC PANEL     Status: Abnormal   Collection Time   01/04/12  5:00 AM      Component Value Range   Sodium 132 (*) 135 - 145 mEq/L   Potassium 3.9  3.5 - 5.1 mEq/L   Chloride 99  96 - 112 mEq/L   CO2 22  19 - 32 mEq/L   Glucose, Bld 139 (*) 70 - 99 mg/dL   BUN 32 (*) 6 - 23 mg/dL   Creatinine, Ser 7.82 (*) 0.50 - 1.10 mg/dL   Calcium 8.3 (*) 8.4 - 10.5 mg/dL   Total Protein 5.3 (*) 6.0 - 8.3 g/dL   Albumin 1.5 (*) 3.5 - 5.2 g/dL   AST 94 (*) 0 -  37 U/L   ALT 16  0 - 35 U/L   Alkaline Phosphatase 162 (*) 39 - 117 U/L   Total Bilirubin 1.4 (*) 0.3 - 1.2 mg/dL   GFR calc non Af Amer 22 (*) >90 mL/min   GFR calc Af Amer 25 (*) >90 mL/min    Intake/Output Summary (Last 24 hours) at 01/04/12 0725 Last data filed at 01/04/12 0600  Gross per 24 hour  Intake 2510.88 ml  Output    135 ml  Net 2375.88 ml    TELELMETRY:  Sinus, sinus tach and occasional bradycardia.  No NSVT  ASSESSMENT AND PLAN:  NQWMI - Troponin trending down.  I do not see the echo report but I did review this yesterday.  Her EF is well preserved.  No further therapy until it is OK to start ASA.    NSVT - Improved after magnesium supplementation.    Fayrene Fearing [facility] 01/04/2012 7:25 AM

## 2012-01-04 NOTE — Progress Notes (Signed)
INITIAL ADULT NUTRITION ASSESSMENT Date: 01/04/2012   Time: 12:47 PM  Reason for Assessment: MD Consult for TF initiation and management   INTERVENTION:  Start TF via NG tube with Oxepa at 10 ml/h, increase by 10 ml every 12 hours to goal rate of 30 ml/h with Prostat 30 ml once daily to provide 1180 kcals, 60 grams protein, 565 ml free water daily.  Monitor magnesium, potassium, and phosphorus daily for at least 3 days, MD to replete as needed, as pt is at risk for refeeding syndrome given severe malnutrition.   DOCUMENTATION CODES Per approved criteria  -Severe malnutrition in the context of chronic illness    ASSESSMENT: Female 61 y.o.  Dx: s/p fall, found to be profoundly hypotensive, with coffee ground emesis; ARDS protocol in place  Hx:  Past Medical History  Diagnosis Date  . Hypertension   . Osteoporosis   . Alcoholic liver disease   . High cholesterol   . Peripheral vascular disease   . Shortness of breath 09/22/11    "because of all the fluid that's built up in my stomach"  . Ascites 09/22/11    "first time for me"  . Anemia   . History of blood transfusion 07/2011    "when I had elbow surgery"  . Arthritis     "all over"  . Dysrhythmia     "irregular"   Past Surgical History  Procedure Date  . Fracture surgery   . Amputation     two toes on left foot  . Bunionectomy     both feet  . Arthroscopic repair acl     right knee  . Joint replacement     right hip   . Orif humerus fracture 07/26/2011    Procedure: OPEN REDUCTION INTERNAL FIXATION (ORIF) DISTAL HUMERUS FRACTURE;  Surgeon: Budd Palmer, MD;  Location: [facility] OR;  Service: Orthopedics;  Laterality: Right;  . Total hip arthroplasty ~ 2003    right  . Tubal ligation 1980  . Paracentesis 09/22/11    2.2L  . Esophagogastroduodenoscopy 09/23/2011    Procedure: ESOPHAGOGASTRODUODENOSCOPY (EGD);  Surgeon: Shirley Friar, MD;  Location: [facility] ENDOSCOPY;  Service: Endoscopy;  Laterality: N/A;    Related  Meds:  Scheduled Meds:   . antiseptic oral rinse  15 mL Mouth Rinse QID  . aspirin  150 mg Rectal Once  . chlorhexidine  15 mL Mouth Rinse BID  . feeding supplement (OXEPA)  1,000 mL Per Tube Q24H  . hydrocortisone sodium succinate  50 mg Intravenous Q6H  . insulin aspart  0-15 Units Subcutaneous Q4H  . pantoprazole (PROTONIX) IV  40 mg Intravenous Q12H  . piperacillin-tazobactam (ZOSYN)  IV  3.375 g Intravenous Q8H  . vancomycin  500 mg Intravenous Q24H  . DISCONTD: octreotide  50 mcg Intravenous Once   Continuous Infusions:   . sodium chloride 20 mL/hr at 01/03/12 0000  . fentaNYL infusion INTRAVENOUS 50 mcg/hr (01/02/12 2232)  . phenylephrine (NEO-SYNEPHRINE) Adult infusion 60 mcg/min (01/04/12 0900)  . vasopressin (PITRESSIN) infusion - *FOR SHOCK* 0.03 Units/min (01/04/12 0027)  . DISCONTD: norepinephrine (LEVOPHED) Adult infusion 4 mcg/min (01/03/12 1100)  . DISCONTD: octreotide (SANDOSTATIN) infusion 50 mcg/hr (01/04/12 1002)  . DISCONTD: pantoprozole (PROTONIX) infusion 8 mg/hr (01/04/12 0444)   PRN Meds:.fentaNYL   Ht: 5' (152.4 cm)  Wt: 126 lb 1.7 oz (57.2 kg)  Ideal Wt: 45.5 kg % Ideal Wt: 126%  Wt Readings from Last 10 Encounters:  01/04/12 126 lb 1.7 oz (57.2 kg)  10/12/11 99 lb 6.8 oz (45.1 kg)  09/26/11 110 lb 10.7 oz (50.2 kg)  09/26/11 110 lb 10.7 oz (50.2 kg)  08/05/11 110 lb (49.896 kg)  07/25/11 108 lb (48.988 kg)  07/25/11 108 lb (48.988 kg)  09/16/09 112 lb (50.803 kg)  08/17/09 113 lb 6.4 oz (51.438 kg)  06/12/08 120 lb 6.4 oz (54.613 kg)   Usual Wt: 100-110 lb % Usual Wt: 120%  Body mass index is 24.63 kg/(m^2).  Food/Nutrition Related Hx: unable to assess  Labs:  CMP     Component Value Date/Time   NA 132* 01/04/2012 0500   K 3.9 01/04/2012 0500   CL 99 01/04/2012 0500   CO2 22 01/04/2012 0500   GLUCOSE 139* 01/04/2012 0500   GLUCOSE 97 11/30/2009   BUN 32* 01/04/2012 0500   CREATININE 2.32* 01/04/2012 0500   CALCIUM 8.3* 01/04/2012  0500   CALCIUM 8.4 07/26/2011 1329   PROT 5.3* 01/04/2012 0500   ALBUMIN 1.5* 01/04/2012 0500   AST 94* 01/04/2012 0500   ALT 16 01/04/2012 0500   ALKPHOS 162* 01/04/2012 0500   BILITOT 1.4* 01/04/2012 0500   GFRNONAA 22* 01/04/2012 0500   GFRAA 25* 01/04/2012 0500    CBG (last 3)   Basename 01/04/12 1127 01/04/12 0750 01/04/12 0408  GLUCAP 115* 117* 124*    Sodium  Date/Time Value Range Status  01/04/2012  5:00 AM 132* 135 - 145 mEq/L Final  01/03/2012  4:48 AM 133* 135 - 145 mEq/L Final  01/02/2012 10:25 PM 139  135 - 145 mEq/L Final    Potassium  Date/Time Value Range Status  01/04/2012  5:00 AM 3.9  3.5 - 5.1 mEq/L Final  01/03/2012  4:48 AM 4.3  3.5 - 5.1 mEq/L Final  01/02/2012 10:25 PM 3.7  3.5 - 5.1 mEq/L Final    Phosphorus  Date/Time Value Range Status  01/03/2012  4:48 AM 3.8  2.3 - 4.6 mg/dL Final  11/26/9145 82:95 PM 4.8* 2.3 - 4.6 mg/dL Final  62/13/0865  7:84 AM 2.0*  Final    Magnesium  Date/Time Value Range Status  01/03/2012  4:48 AM 2.3  1.5 - 2.5 mg/dL Final  6/96/2952 84:13 PM 1.1* 1.5 - 2.5 mg/dL Final  2/44/0102  7:25 PM 0.9* 1.5 - 2.5 mg/dL Final     CRITICAL RESULT CALLED TO, READ BACK BY AND VERIFIED WITH:     JACOBS A.,RN 07/27/11 1809 BY JONESJ     Intake/Output Summary (Last 24 hours) at 01/04/12 1358 Last data filed at 01/04/12 1300  Gross per 24 hour  Intake 2394.88 ml  Output    150 ml  Net 2244.88 ml    Diet Order: Oxepa with goal rate of 40 ml/h (1440 kcals, 60 grams protein, 754 ml free water daily)   IVF:    sodium chloride Last Rate: 20 mL/hr at 01/03/12 0000  fentaNYL infusion INTRAVENOUS Last Rate: 50 mcg/hr (01/02/12 2232)  phenylephrine (NEO-SYNEPHRINE) Adult infusion Last Rate: 60 mcg/min (01/04/12 0900)  vasopressin (PITRESSIN) infusion - *FOR SHOCK* Last Rate: 0.03 Units/min (01/04/12 0027)  DISCONTD: norepinephrine (LEVOPHED) Adult infusion Last Rate: 4 mcg/min (01/03/12 1100)  DISCONTD: octreotide (SANDOSTATIN) infusion Last  Rate: 50 mcg/hr (01/04/12 1002)  DISCONTD: pantoprozole (PROTONIX) infusion Last Rate: 8 mg/hr (01/04/12 0444)    Estimated Nutritional Needs:   Kcal: 1200 Protein: 60-75 grams Fluid: 1.2 liters  Weight is above usual weight due to fluid overload/ascites.  Patient appears malnourished with wasting of temples.  GI is following, no  gross bleeding found.  NG tube in place draining green liquid, abdomen somewhat distended, bowel sounds are present per GI note.  Plans to start TF today via NG tube.  Patient with alcoholic cirrhosis with intractable ascites.  Patient is currently intubated on ventilator support.  MV: 6.7 Temp:Temp (24hrs), Avg:97.4 F (36.3 C), Min:96.9 F (36.1 C), Max:97.8 F (36.6 C)   Pt meets criteria for severe malnutrition in the context of chronic illness as evidenced by severe temporal wasting, severe loss of subcutaneous body fat, and severe fluid accumulation.   NUTRITION DIAGNOSIS: -Inadequate oral intake (NI-2.1).  Status: Ongoing  RELATED TO: inability to eat  AS EVIDENCED BY: NPO status  MONITORING/EVALUATION(Goals): Goal:  Intake to meet >90% of estimated nutrition needs. Monitor:  Labs, weight trend, TF tolerance/adequacy, vent status, I/O.  EDUCATION NEEDS: -Education not appropriate at this time   Joaquin Courts, RD, LDN, CNSC Pager# [phone] After Hours Pager# [phone] 01/04/2012, 12:47 PM

## 2012-01-04 NOTE — Progress Notes (Signed)
Name: Kathryn [name] MRN: [id number] DOB: 1951-02-15    LOS: 2  Referring Provider:  EDP Reason for Referral:  Shock  PULMONARY / CRITICAL CARE MEDICINE  The patient is encephalopathic and unable to provide history, which was obtained for available medical records.  HPI:  61 yo with alcoholic liver disease presenting to [facility] ED s/p fall, found to be profoundly hypotensive, with coffee ground emesis.   Brief patient description:  61 yo with alcoholic liver disease presenting to [facility] ED s/p fall, found to be profoundly hypotensive, with coffee ground emesis.  Events Since Admission: 9/16 >>> Presenting to [facility] ED s/p fall, found to be profoundly hypotensive, with coffee ground emesis. 9/17 >>> Intubated for ARF 9/18 >>> Improve neuro and BP  Current Status: HGB improved, weaning pressors, little to no urine output.  Vital Signs: Temp:  [96.9 F (36.1 C)-97.8 F (36.6 C)] 97 F (36.1 C) (09/18 0900) Pulse Rate:  [65-94] 81  (09/18 1105) Resp:  [18-26] 18  (09/18 1105) BP: (79-100)/(56-80) 89/56 mmHg (09/18 1105) SpO2:  [98 %-100 %] 99 % (09/18 1105) Arterial Line BP: (85-110)/(60-82) 98/69 mmHg (09/18 0900) FiO2 (%):  [40 %-50 %] 40 % (09/18 1105) Weight:  [57.2 kg (126 lb 1.7 oz)] 57.2 kg (126 lb 1.7 oz) (09/18 0500) CVP:  [15 mmHg-18 mmHg] 18 mmHg  Physical Examination: General:  Appears greater than age and alert and nodding app to questions. Neuro:  Awake, alert, but intubated HEENT:  PERRL, dry membranes, intubated Neck:  No JVD, Right CVC in place Cardiovascular:  Regular rhythm, rapid rate, s1 s2, pulses weak in all ext. Lungs:  Clear bilaterally in upper lobes, diminished in bilateral bases. Abdomen:  Distended but soft, mild generalized tenderness, coffee ground emesis noted to OG Musculoskeletal:  Moves all extremities Skin:  .5 cm x .5 cm wound noted to left plantar surface of the foot  Active Problems:  Lactic acidosis  Septic shock  Fall  Acute renal failure  Coffee ground emesis  Hemorrhagic shock  Acute respiratory failure  Myocardial infarction acute  ASSESSMENT AND PLAN  PULMONARY  Lab 01/03/12 1115 01/03/12 0500 01/02/12 2320 01/02/12 2050  PHART 7.434 7.455* 7.390 7.470*  PCO2ART 35.7 30.5* 37.3 25.8*  PO2ART 94.0 78.0* 107.0* 78.0*  HCO3 24.0 21.5 22.5 18.9*  O2SAT 98.0 96.0 98.0 97.0    Ventilator Settings: Vent Mode:  [-] PSV FiO2 (%):  [40 %-50 %] 40 % Set Rate:  [20 bmp] 20 bmp Vt Set:  [320 mL] 320 mL PEEP:  [5 cmH20-8 cmH20] 5 cmH20 Pressure Support:  [5 cmH20] 5 cmH20 Plateau Pressure:  [10 cmH20-18 cmH20] 12 cmH20 CXR:  9/16 >>> R IJ TLC in place, small volumes, bilateral small effusions, no overt infiltrates ETT:  9/17 >>>  A:  Acute Respiratory Failure with respiratory alkalosis mild, ARDS , meets criteria - fio2 improved to 40% P:   ARDS protocol, low volume ventilation Low dose pressors improving, consider CPAP PS attempt Likely will  Need there tap to improve mechanics Recheck ABG to follow Supplemental oxygen Fentanyl for vent dys Chest Xray In AM  CARDIOVASCULAR  Lab 01/03/12 2100 01/03/12 1515 01/03/12 1055 01/03/12 0349 01/02/12 2200 01/02/12 1748  TROPONINI 13.14* 18.98* >20.00* -- -- --  LATICACIDVEN -- -- -- 1.4 5.1* 4.6*  PROBNP -- -- -- -- -- --   ECG:  9/16 >>> Sinus tachycardia, nonspecific ST-T changes Lines: R IJ TLC 9/16 >>>  L Radial Aline 9/16 >>>  A: shock (septic vs hemorrhagic).      Ventricular Arrythmia       NSTEMI - preserved EF of 55-60 Improved ectopy P:  Goal MAP > 60 Weaning Phenylphrine, vaso currently off cvp 12-15 Stress roids to continue,, until off pressors, no cortisol noted Start ASA Echo assessment  RENAL  Lab 01/04/12 0500 01/03/12 0448 01/02/12 2333 01/02/12 2225 01/02/12 2051 01/02/12 1805  NA 132* 133* -- 139 137 135  K 3.9 4.3 -- -- -- --  CL 99 100 -- 106 104 91*  CO2 22 21 -- -- -- 28  BUN 32* 27* -- 24* 30* 30*  CREATININE 2.32*  1.63* -- 1.50* 1.60* 1.71*  CALCIUM 8.3* 8.8 -- -- -- 11.4*  MG -- 2.3 1.1* -- -- --  PHOS -- 3.8 4.8* -- -- --   Intake/Output      09/17 0701 - 09/18 0700 09/18 0701 - 09/19 0700   I.V. (mL/kg) 2440.8 (42.7) 220.5 (3.9)   Other     IV Piggyback 250    Total Intake(mL/kg) 2690.8 (47) 220.5 (3.9)   Urine (mL/kg/hr) 35 (0)    Emesis/NG output 100    Total Output 135    Net +2555.8 +220.5         Foley:  9/16 >>>  A:  Acute renal failure, likely ATN, r/o hepato-renal, at risk abdo compartment      Electrolyte disturbance       P:   Goal CVP 10 to12, at goal, assess pulm pressures NS boluses PRN for gola CVP Avoid IV contrast r/t AKI Recheck BMP and replace electrolytes with caution in am  Renal ultrasound for rising creatinine Urine na When off pressors, likely to add lasix Support MAP Assess abdo pressure  GASTROINTESTINAL  Lab 01/04/12 0500 01/02/12 1805  AST 94* 37  ALT 16 9  ALKPHOS 162* 248*  BILITOT 1.4* 1.4*  PROT 5.3* 5.9*  ALBUMIN 1.5* 1.7*   A:  Alcoholic cirrhosis.  Suspected upper GI hemorrhage.  Low clinical suspicion of varices.  Possible SBP.  P:   consider TF start, oxepa Ards D/c Octreotide gtt as low suspcion D/c Protonix gtt, protonix  40 q12h GI consulted with no indication for emergent intervention, rec following cbc, agree  HEMATOLOGIC  Lab 01/04/12 0500 01/03/12 0448 01/02/12 2333 01/02/12 2225 01/02/12 2051 01/02/12 1805  HGB 11.4* 12.0 11.7* 11.6* 6.1* --  HCT 33.1* 34.2* 34.0* 34.0* 18.0* --  PLT 215 254 229 -- -- 288  INR -- -- -- -- -- 1.19  APTT -- -- -- -- -- 28   A:  Acute anemia, source unclear. Hb 10-->6 P:  Follow CBC TO Q12H Transfuse for hgb <7 or clinically bleeding coags in am   INFECTIOUS  Lab 01/04/12 0500 01/03/12 0448 01/02/12 2333 01/02/12 2205 01/02/12 1805  WBC 17.5* 33.2* 27.9* -- 22.0*  PROCALCITON -- -- -- 1.63 --   Cultures: 9/16  Blood >>> 9/16  Urine >>> 9/17  Ascites fluid  >>> Antibiotics: Vancomycin 9/16 >>> Zosym 9/16 >>>  A: Septic shock. R/o PAn as source, r/o SBP (wbc less 250 noted, unlikely)      Wound infection with tunneling. Concern for osteo? P:   Antibiotic/cultures as above Imaging of the left foot. Will narrow in am  Unlikely sbo, follow cultures  ENDOCRINE  Lab 01/04/12 0750 01/04/12 0408 01/04/12 0027 01/03/12 2000 01/03/12 1600  GLUCAP 117* 124* 140* 160* 150*  A:  Presumed relative adrenal insufficiency.      Hyperglycemia P:  Stress dose steroids with hydrocortisone       SSI  Monitor off octreotide   NEUROLOGIC  A:  Acute encephalopathy (septic, metabolic).   P:   Continuous sedation with fentanyl Add folic acid and thiamine  BEST PRACTICE / DISPOSITION Level of Care:  ICU Primary Service:  PCCM Consultants:   GI Code Status:  Full Diet:  NPO DVT Px:  SCDs GI Px:  Protonix gtt Skin Integrity: Left foot wound, wound care consult Social / Family:  Will cal husband   Rosebrock, Pearletha Furl, NP student  I have fully examined this patient and agree with above findings.    And edited infull  Ccm time 40 min   Mcarthur Rossetti. Tyson Alias, MD, FACP Pgr: [phone] [facility]

## 2012-01-05 ENCOUNTER — Inpatient Hospital Stay (HOSPITAL_COMMUNITY): Payer: Medicare Other

## 2012-01-05 LAB — BASIC METABOLIC PANEL
CO2: 20 mEq/L (ref 19–32)
Chloride: 100 mEq/L (ref 96–112)
GFR calc Af Amer: 20 mL/min — ABNORMAL LOW (ref >90)
Potassium: 3.9 mEq/L (ref 3.5–5.1)
Sodium: 134 mEq/L — ABNORMAL LOW (ref 135–145)

## 2012-01-05 LAB — CBC
Platelets: 120 10*3/uL — ABNORMAL LOW (ref 150–400)
RBC: 3.3 MIL/uL — ABNORMAL LOW (ref 3.87–5.11)
WBC: 12.1 10*3/uL — ABNORMAL HIGH (ref 4.0–10.5)

## 2012-01-05 LAB — GLUCOSE, CAPILLARY
Glucose-Capillary: 101 mg/dL — ABNORMAL HIGH (ref 70–99)
Glucose-Capillary: 118 mg/dL — ABNORMAL HIGH (ref 70–99)
Glucose-Capillary: 126 mg/dL — ABNORMAL HIGH (ref 70–99)
Glucose-Capillary: 138 mg/dL — ABNORMAL HIGH (ref 70–99)

## 2012-01-05 LAB — PROTIME-INR: INR: 1.35 (ref 0.00–1.49)

## 2012-01-05 LAB — APTT: aPTT: 29 seconds (ref 24–37)

## 2012-01-05 MED ORDER — HYDROCORTISONE SOD SUCCINATE 100 MG IJ SOLR
25.0000 mg | Freq: Four times a day (QID) | INTRAMUSCULAR | Status: DC
  Administered 2012-01-05 – 2012-01-06 (×4): 25 mg via INTRAVENOUS
  Filled 2012-01-05 (×8): qty 0.5

## 2012-01-05 MED ORDER — VASOPRESSIN 20 UNIT/ML IJ SOLN
0.0300 [IU]/min | INTRAVENOUS | Status: DC
  Filled 2012-01-05: qty 5

## 2012-01-05 MED ORDER — ALBUMIN HUMAN 5 % IV SOLN
12.5000 g | Freq: Two times a day (BID) | INTRAVENOUS | Status: DC
  Administered 2012-01-05 – 2012-01-07 (×5): 12.5 g via INTRAVENOUS
  Filled 2012-01-05 (×10): qty 250

## 2012-01-05 MED ORDER — FENTANYL CITRATE 0.05 MG/ML IJ SOLN
25.0000 ug | INTRAMUSCULAR | Status: DC | PRN
  Administered 2012-01-05 – 2012-01-06 (×7): 50 ug via INTRAVENOUS
  Administered 2012-01-07: 25 ug via INTRAVENOUS
  Administered 2012-01-07: 50 ug via INTRAVENOUS
  Administered 2012-01-07: 25 ug via INTRAVENOUS
  Filled 2012-01-05 (×10): qty 2

## 2012-01-05 MED ORDER — MAGNESIUM SULFATE 40 MG/ML IJ SOLN
2.0000 g | Freq: Once | INTRAMUSCULAR | Status: AC
  Administered 2012-01-05: 2 g via INTRAVENOUS
  Filled 2012-01-05: qty 50

## 2012-01-05 MED ORDER — ALBUMIN HUMAN 5 % IV SOLN
12.5000 g | Freq: Once | INTRAVENOUS | Status: AC
  Administered 2012-01-05: 12.5 g via INTRAVENOUS

## 2012-01-05 MED ORDER — VASOPRESSIN 20 UNIT/ML IJ SOLN
0.0300 [IU]/min | INTRAMUSCULAR | Status: DC
  Administered 2012-01-05: 0.03 [IU]/min via INTRAVENOUS
  Filled 2012-01-05: qty 5

## 2012-01-05 MED ORDER — DEXTROSE 5 % IV SOLN
1.0000 g | INTRAVENOUS | Status: DC
  Administered 2012-01-05 – 2012-01-08 (×4): 1 g via INTRAVENOUS
  Filled 2012-01-05 (×4): qty 10

## 2012-01-05 MED ORDER — FUROSEMIDE 10 MG/ML IJ SOLN
60.0000 mg | Freq: Two times a day (BID) | INTRAMUSCULAR | Status: DC
  Administered 2012-01-05 – 2012-01-08 (×8): 60 mg via INTRAVENOUS
  Filled 2012-01-05 (×12): qty 6

## 2012-01-05 NOTE — Progress Notes (Signed)
Pt extubated around 1430. Placed on 3 L [place]. Tolerated well.

## 2012-01-05 NOTE — Progress Notes (Signed)
[facility] PROGRESS NOTE Subjective Patient still intubated but is not having any further GI bleeding. Hemoglobin is basically stable. She is currently receiving tube feedings through her NG tube.  Objective: Vital signs in last 24 hours: Temp:  [95.3 F (35.2 C)-97.4 F (36.3 C)] 97.4 F (36.3 C) (09/19 0800) Pulse Rate:  [55-123] 102  (09/19 0800) Resp:  [10-32] 10  (09/19 0800) BP: (64-104)/(50-71) 93/70 mmHg (09/19 0800) SpO2:  [99 %-100 %] 100 % (09/19 0800) Arterial Line BP: (67-122)/(49-85) 105/62 mmHg (09/19 0800) FiO2 (%):  [39.9 %-50 %] 40 % (09/19 0800) Weight:  [58.6 kg (129 lb 3 oz)] 58.6 kg (129 lb 3 oz) (09/19 0400)    Intake/Output from previous day: 09/18 0701 - 09/19 0700 In: 1757.6 [I.V.:1020.1; NG/GT:465; IV Piggyback:247.5] Out: 190 [Urine:165; Emesis/NG output:25] Intake/Output this shift: Total I/O In: 62.5 [I.V.:20; NG/GT:30; IV Piggyback:12.5] Out: -   PE: Gen.-alert and answers questions appropriately by nodding yes or no Abdomen-distended probable ascites nontender  Lab Results:  Basename 01/05/12 0500 01/04/12 0500 01/03/12 0448 01/02/12 2333 01/02/12 2225 01/02/12 1805  WBC 12.1* 17.5* 33.2* 27.9* -- 22.0*  HGB 10.8* 11.4* 12.0 11.7* 11.6* --  HCT 31.7* 33.1* 34.2* 34.0* 34.0* --  PLT 120* 215 254 229 -- 288   BMET  Basename 01/05/12 0500 01/04/12 0500 01/03/12 0448 01/02/12 2225 01/02/12 2051 01/02/12 1805  NA 134* 132* 133* 139 137 --  K 3.9 3.9 4.3 3.7 4.0 --  CL 100 99 100 106 104 --  CO2 20 22 21  -- -- 28  CREATININE 2.80* 2.32* 1.63* 1.50* 1.60* --   LFT  Basename 01/04/12 0500 01/02/12 1805  PROT 5.3* 5.9*  AST 94* 37  ALT 16 9  ALKPHOS 162* 248*  BILITOT 1.4* 1.4*  BILIDIR -- --  IBILI -- --   PT/INR  Basename 01/02/12 1805  LABPROT 15.4*  INR 1.19   PANCREAS No results found for this basename: LIPASE:3 in the last 72 hours       Studies/Results: US Renal Port  01/04/2012  *RADIOLOGY REPORT*   Clinical Data: Acute renal failure, history hypertension, fall of liver disease  RENAL/URINARY TRACT ULTRASOUND COMPLETE PORTABLE  Comparison:  Abdomen ultrasound 07/27/2011  Findings:  Right Kidney:  10.2 cm length.  Mild cortical thinning.  Increased cortical echogenicity.  No mass, hydronephrosis shadowing calcification.  Left Kidney:  1.7 cm length.  Cortical thinning.  Increased cortical echogenicity.  No mass, hydronephrosis shadowing calcification.  Bladder:  Decompressed by Foley catheter, inadequately visualized.  Incidentally noted bilateral pleural effusions and significant ascites.  IMPRESSION: Medical renal disease changes of both kidneys. No evidence of renal mass or obstructive uropathy. Bilateral pleural effusions and significant ascites.   Original Report Authenticated By: Lollie Marrow, M.D.    Dg Chest Port 1 View  01/04/2012  *RADIOLOGY REPORT*  Clinical Data: Post adjustment of endotracheal tube  PORTABLE CHEST - 1 VIEW  Comparison: Portable exam 1250 hours compared to 0449 hours  Findings: Tip of endotracheal tube remains within 8 mm of the carina directed towards the right main stem bronchus. Nasogastric tube extends into stomach. Right jugular central venous catheter tip projecting over SVC. Severely rotated to the left. Stable heart size. Persistent dense atelectasis or consolidation of left lower lobe with persistent right basilar atelectasis. Cannot exclude small layered pleural effusions. No pneumothorax. Old healed left rib fractures.  IMPRESSION: Tip of endotracheal tube remains 8 mm of carina directed towards right mainstem bronchus, recommend withdrawal 2  cm. Otherwise little change.  Findings called to Norwood Levo RN on 2100 Unit on 1432 hours 01/04/2012 at 1432 hours.   Original Report Authenticated By: Lollie Marrow, M.D.    Dg Chest Port 1 View  01/04/2012  *RADIOLOGY REPORT*  Clinical Data: Assess ET tube  PORTABLE CHEST - 1 VIEW  Comparison: 01/02/2012  Findings: Endotracheal  tube tip is at the carina, directed toward the right main bronchus.  NG tube descends below the level of the image.  Bilateral pleural effusions and associated consolidations. No definite pneumothorax.  No interval osseous change with multiple posterior left rib fractures that are favored remote. Cardiomediastinal contours are partially obscured by the effusions. May be mild cardiac enlargement and central vascular congestion. Edema suggested though mildly decreased. Right IJ catheter tip projects over the distal SVC.  IMPRESSION: Endotracheal tube tip at the carina, directed toward the right main bronchus, recommend 2-3 cm of retraction.  Prominent cardiac contour with central vascular congestion and mild improvement in the edema pattern.  Bilateral pleural effusions and associate consolidations; atelectasis versus pneumonia, similar to slightly increased.   Original Report Authenticated By: Waneta Martins, M.D.    Dg Foot 2 Views Left  01/04/2012  *RADIOLOGY REPORT*  Clinical Data: Left foot swelling, on ventilator, question soft tissue gas  LEFT FOOT - 2 VIEW  Comparison: Portable exam 1253 hours without priors for comparison  Findings: Prior amputation of the second toe and third ray beyond the distal metatarsal. Screws present at second and fourth metatarsal heads. Diffuse osseous demineralization. Soft tissue lucency identified at great toe deep to proximal phalanx question ulcer. Probable prior bunion surgery distal first metatarsal. No definite fracture, dislocation or bone destruction. Mild degenerative changes at first TMT joint. No other soft tissue gas identified.  IMPRESSION: Prior amputations at the second and third toes. Soft tissue lucency identified at the plantar aspect of the foot at the level of the proximal phalanx of the great toe, question large ulcer, recommend clinical correlation. No definite radiographic evidence of osteomyelitis.   Original Report Authenticated By: Lollie Marrow,  M.D.    Dg Foot 2 Views Left  01/03/2012  Nelda Bucks, MD     01/03/2012 11:20 AM Paracentesis Procedure Note  Pre-operative Diagnosis: Acholic Cirrhosis with septic shock   Post-operative Diagnosis: same  Indications: Peritoneal fluid collection  Procedure Details  Consent: Informed consent was obtained from the husband. Risks of  the procedure were discussed including: infection, bleeding,  pain, bowel perferation.  Under sterile conditions the patient was positioned.  Fluid  collection was visualized under real time ultrasound. CHG  solution and sterile drapes were utilized.  1% plain lidocaine  was used to anesthetize the left lower quadrant of the abdomen.  Fluid was obtained without any difficulties and no blood loss.  A  dressing was applied to the wound and wound care instructions  were provided.   Findings 60 ml of clear yellow peritoneal fluid was obtained. A sample was  sent to Pathology for cell counts, culture and gram stain, and  LDH  Complications:  None; patient tolerated the procedure well.          Condition: stable  Plan Follow cultures  Attending Attestation: I was present for the entire procedure.  Richard A. Rosebrock, NP Student  Tolerate d well US guidance  Mcarthur Rossetti. Tyson Alias, MD, FACP Pgr: [phone] [place] [facility]         Medications: I have reviewed the patient's  current medications.  Assessment/Plan: 1.  GI bleeding. Appears to have resolved. Patient did not have varices on recent EGD marked esophagitis and had been taking Protonix at home according to her home medication list. At this point, would not do EGD continue to treat with PPI. 2. Alcoholic cirrhosis with intractable ascites. Has required paracentesis recently for control of ascites. We'll discuss with Dr. Dulce Sellar, may need to consider TIPS in the future   Forever Arechiga JR,Shabrea Weldin L 01/05/2012, 9:03 AM

## 2012-01-05 NOTE — Procedures (Signed)
Paracentesis Procedure Note   Pre-operative Diagnosis: Ascites, cirrhosis  Post-operative Diagnosis: Same   Indications: Therapeutic fluid removal, dyspnea   Procedure Details  Consent: Informed consent was obtained. Risks of the procedure were discussed including: infection, bleeding, pain, and bowel perforation. The patient was positioned. Chloraprep and sterile drapes were utilized. Under sterile conditions, 1% buffered lidocaine was used to anesthetize left lower quadrant of the abdomen. A large pocket of fluid was visualized by ultrasound at the planned needle insertion site, as well as an unusual hypoechoic area between the peritoneal serosa and the subcutaneous tissue (thought to be a hematoma from a prior paracentesis).  The needle and catheter were advanced, with return of ascitic fluid.  The catheter was then further advanced over the needle, but only returned a small volume (approx 30 cc) of serosanguinous fluid.  After several attempts to reposition the catheter, the catheter was removed, and the site was bandaged.  Findings  A scant amount (approx 30 cc) of serosanguinous fluid was removed, likely representing catheter placement in a prior hematoma. Blood loss: minimal  Complications: None; patient tolerated the procedure well. A follow-up upright CXR was obtained, which showed no evidence of free air under the diaphragm.  Condition: stable   Signed, Janalyn Harder, MD PGY-2, Internal Medicine. Pgr. [phone] 01/05/2012, 2:20 PM  No hemodynamic changes No free air Performed with resident Mcarthur Rossetti. Tyson Alias, MD, FACP Pgr: [phone] [place] [facility]

## 2012-01-05 NOTE — Procedures (Signed)
Paracentesis Procedure Note   Pre-operative Diagnosis: ascites  Post-operative Diagnosis: Same   Indications: Therapeutic fluid removal, dyspnea   Procedure Details  Consent: Informed consent was obtained. Risks of the procedure were discussed including: infection, bleeding, pain, and bowel perforation. The patient was positioned.  Chloraprep and sterile drapes were utilized.  A large fluid collection was identified by ultrasound in the right lower quadrant.  Under sterile conditions, 1% buffered lidocaine was used to anesthetize the right lower quadrant.  The paracentesis needle and catheter were advanced into the skin, with return of straw-colored ascitic fluid.  The catheter was further advanced over the needle, and the same fluid continued to return.  The catheter was connected to vacuum bottles, which removed a total of 5 liters of fluid.  The catheter was then removed, and a bandage placed over the area.  Findings  Five liters of ascitic fluid were removed and discarded.  No samples were sent for analysis. Blood loss: minimal  Complications: None; patient tolerated the procedure well.   Condition: stable   Signed, Janalyn Harder, MD PGY-2, Internal Medicine. Pgr. [phone] 01/05/2012, 2:29 PM  US guidance  tolerted well Albumin given during Mcarthur Rossetti. Tyson Alias, MD, FACP Pgr: [phone] [place] [facility]

## 2012-01-05 NOTE — Progress Notes (Signed)
SUBJECTIVE:  Wide awake.  No chest pain   PHYSICAL EXAM Filed Vitals:   01/05/12 0400 01/05/12 0412 01/05/12 0500 01/05/12 0600  BP: 81/61 81/61 89/65  85/67  Pulse: 95 123 101 94  Temp: 96.8 F (36 C)  96.9 F (36.1 C) 96.9 F (36.1 C)  TempSrc: Core (Comment)  Core (Comment) Core (Comment)  Resp: 20 32 20 21  Height:      Weight: 129 lb 3 oz (58.6 kg)     SpO2: 100%  100% 100%   General:  Intubated awake Lungs:  Decreased breath sounds Heart:  RRR Abdomen:  Distended with absent bowel sounds Extremities:  Decreased pulses with mild diffuse edema.   LABS: Lab Results  Component Value Date   CKTOTAL 202* 01/03/2012   CKMB 55.8* 01/03/2012   TROPONINI 13.14* 01/03/2012   Results for orders placed during the hospital encounter of 01/02/12 (from the past 24 hour(s))  GLUCOSE, CAPILLARY     Status: Abnormal   Collection Time   01/04/12  7:50 AM      Component Value Range   Glucose-Capillary 117 (*) 70 - 99 mg/dL  GLUCOSE, CAPILLARY     Status: Abnormal   Collection Time   01/04/12 11:27 AM      Component Value Range   Glucose-Capillary 115 (*) 70 - 99 mg/dL  SODIUM, URINE, RANDOM     Status: Normal   Collection Time   01/04/12 12:44 PM      Component Value Range   Sodium, Ur 13    GLUCOSE, CAPILLARY     Status: Abnormal   Collection Time   01/04/12  4:28 PM      Component Value Range   Glucose-Capillary 107 (*) 70 - 99 mg/dL  GLUCOSE, CAPILLARY     Status: Abnormal   Collection Time   01/04/12  8:04 PM      Component Value Range   Glucose-Capillary 112 (*) 70 - 99 mg/dL  GLUCOSE, CAPILLARY     Status: Abnormal   Collection Time   01/04/12 11:50 PM      Component Value Range   Glucose-Capillary 101 (*) 70 - 99 mg/dL   Comment 1 Documented in Chart     Comment 2 Notify RN    GLUCOSE, CAPILLARY     Status: Abnormal   Collection Time   01/05/12  4:12 AM      Component Value Range   Glucose-Capillary 126 (*) 70 - 99 mg/dL   Comment 1 Documented in Chart     Comment 2 Notify RN    CBC     Status: Abnormal   Collection Time   01/05/12  5:00 AM      Component Value Range   WBC 12.1 (*) 4.0 - 10.5 K/uL   RBC 3.30 (*) 3.87 - 5.11 MIL/uL   Hemoglobin 10.8 (*) 12.0 - 15.0 g/dL   HCT 16.1 (*) 09.6 - 04.5 %   MCV 96.1  78.0 - 100.0 fL   MCH 32.7  26.0 - 34.0 pg   MCHC 34.1  30.0 - 36.0 g/dL   RDW 40.9 (*) 81.1 - 91.4 %   Platelets 120 (*) 150 - 400 K/uL  BASIC METABOLIC PANEL     Status: Abnormal   Collection Time   01/05/12  5:00 AM      Component Value Range   Sodium 134 (*) 135 - 145 mEq/L   Potassium 3.9  3.5 - 5.1 mEq/L   Chloride 100  96 - 112 mEq/L   CO2 20  19 - 32 mEq/L   Glucose, Bld 128 (*) 70 - 99 mg/dL   BUN 37 (*) 6 - 23 mg/dL   Creatinine, Ser 1.19 (*) 0.50 - 1.10 mg/dL   Calcium 8.1 (*) 8.4 - 10.5 mg/dL   GFR calc non Af Amer 17 (*) >90 mL/min   GFR calc Af Amer 20 (*) >90 mL/min    Intake/Output Summary (Last 24 hours) at 01/05/12 0646 Last data filed at 01/05/12 0600  Gross per 24 hour  Intake   1811 ml  Output    170 ml  Net   1641 ml    TELELMETRY:  Sinus tach. No NSVT  ASSESSMENT AND PLAN:  NQWMI - EF preserved without clear wall motion abnormality on echo.  Conservative therapy.  Holding any ASA or heparin with recent GI bleed.    NSVT - Improved after magnesium supplementation.    Prolonged QT - Her QT is very prolonged.  Calcium level was only mildly reduced today.  Potassium OK.  I will repeat a magnesium level.  Repeat EKG today.    Fayrene Fearing [facility] 01/05/2012 6:46 AM

## 2012-01-05 NOTE — Progress Notes (Signed)
Placed gauze over puncture sites after paracentesis d/t small amount of drainage. One puncture site on each side of abdomen.

## 2012-01-05 NOTE — Progress Notes (Signed)
Clinical Social Worker met with pt at bedside.  Pt familiar with this CSW from previous admits.  CSW provided emotional support.  CSW provided information on HCPOA.  Pt would like to review HCPOA paperwork with her dtr.  CSW to continue to follow and assist as needed.   Angelia Mould, MSW, [place] [phone]

## 2012-01-05 NOTE — Procedures (Signed)
Extubation Procedure Note  Patient Details:   Name: Kathryn [name] DOB: 1950/06/21 MRN: [id number]   Airway Documentation:  AIRWAYS 7.5 mm (Active)  Secured at (cm) 23 cm 01/02/2012 12:00 AM    Evaluation  O2 sats: stable throughout Complications: No apparent complications Patient did tolerate procedure well. Bilateral Breath Sounds: Clear Suctioning: Airway;Oral Yes. Pt was extubated per Dr. Chestine Spore orders.  Placed on [place]. Pt alert and tolerating well.  [name] 01/05/2012, 2:59 PM

## 2012-01-05 NOTE — Progress Notes (Signed)
Name: Kathryn [name] MRN: [id number] DOB: [birth year]    LOS: 3  Referring Provider:  EDP Reason for Referral:  Shock  PULMONARY / CRITICAL CARE MEDICINE  Brief patient description:  61 yo with alcoholic liver disease presenting to [facility] ED s/p fall, found to be profoundly hypotensive, with coffee ground emesis.  Events Since Admission: 9/16 >>> Presenting to [facility] ED s/p fall, found to be profoundly hypotensive, with coffee ground emesis. 9/17 >>> Intubated for ARF 9/18 >>> Improved neuro and BP, started Oxepa feeds, off all pressors 9/19 >>> tolerating SBT well, plan for paracentesis  Current Status: All pressors weaned off yesterday.  Patient awake and following commands.  Tolerating SBT well today on 5/5.  Abdomen remains distended.  Hb only mildly downtrending.  Afebrile overnight.  Lines/Tubes:  R IJ TLC 9/16 >>> L Radial Aline 9/16 >>> ETT:  9/17 >>> Foley:  9/16 >>>  Cultures: 9/16  Blood x2 >>> NGTD 9/16  Urine >>> No growth (final) 9/17  Ascites fluid >>> NGTD 9/16  Procalcitonin >> 1.63  Antibiotics: Vancomycin 9/16 >>> Zosyn 9/16 >>>  Vital Signs: Temp:  [95.3 F (35.2 C)-97.4 F (36.3 C)] 97.3 F (36.3 C) (09/19 0744) Pulse Rate:  [55-123] 96  (09/19 0744) Resp:  [18-32] 20  (09/19 0744) BP: (64-98)/(50-72) 98/62 mmHg (09/19 0744) SpO2:  [99 %-100 %] 100 % (09/19 0744) Arterial Line BP: (67-122)/(49-85) 98/60 mmHg (09/19 0600) FiO2 (%):  [39.9 %-50 %] 50 % (09/19 0744) Weight:  [129 lb 3 oz (58.6 kg)] 129 lb 3 oz (58.6 kg) (09/19 0400) CVP:  [12 mmHg] 12 mmHg  Physical Examination: General:  Lying in bed, intubated, gestures for communication Neuro:  Awake, follows commands, moves all 4 extremities HEENT:  PERRL, dry membranes, intubated Neck:  No JVD Cardiovascular:  Regular rhythm, tachycardic, no murmurs Lungs:  Clear bilaterally in upper lobes, diminished in bilateral bases. Abdomen:  Firm, distended, generalized mild ttp Skin:  .5 cm x .5 cm wound  noted to left plantar surface of the foot  Active Problems:  Lactic acidosis  Septic shock  Fall  Acute renal failure  Coffee ground emesis  Hemorrhagic shock  Acute respiratory failure  Myocardial infarction acute  ASSESSMENT AND PLAN  PULMONARY  Lab 01/03/12 1115 01/03/12 0500 01/02/12 2320 01/02/12 2050  PHART 7.434 7.455* 7.390 7.470*  PCO2ART 35.7 30.5* 37.3 25.8*  PO2ART 94.0 78.0* 107.0* 78.0*  HCO3 24.0 21.5 22.5 18.9*  O2SAT 98.0 96.0 98.0 97.0    Ventilator Settings: Vent Mode:  [-] CPAP;PSV FiO2 (%):  [39.9 %-50 %] 50 % Set Rate:  [20 bmp] 20 bmp Vt Set:  [320 mL] 320 mL PEEP:  [5 cmH20] 5 cmH20 Pressure Support:  [5 cmH20] 5 cmH20 Plateau Pressure:  [10 cmH20-11 cmH20] 11 cmH20 CXR:  9/16 >>> R IJ TLC in place, small volumes, bilateral small effusions, no overt infiltrates  A:  Acute Respiratory Failure with respiratory alkalosis, ARDS P:   -ARDS protocol, low volume ventilation -Discontinued pressors yesterday -Tolerating SBT well today, cpap 5 ps 5 Good MS -Full vent support, can hopefully extubate in next 1-2 days -Fentanyl for sedation -Chest Xray In AM -Plan for paracentesis today, which will likely improve respiratory mechanics, pending mechanics eval Lasix start , follow effusions on pcxr  CARDIOVASCULAR  Lab 01/03/12 2100 01/03/12 1515 01/03/12 1055 01/03/12 0349 01/02/12 2200 01/02/12 1748  TROPONINI 13.14* 18.98* >20.00* -- -- --  LATICACIDVEN -- -- -- 1.4 5.1* 4.6*  PROBNP -- -- -- -- -- --  ECG:  9/19 >>> improved QTc from yesterday, though still elevated at 508  A: shock (septic vs hemorrhagic).      Ventricular Arrythmia       NSTEMI - preserved EF of 55-60, unimpressive  Prolonged qtc P:  -Greatly appreciate Cardiology consult -Off pressors yesterday -cvp 12 -Continue solu-cortef stress-dose steroids, can likely wean soon over next 4 days -Hold ASA and heparin, per cards -qtc noted, mag supp -lasix  RENAL  Lab 01/05/12  0500 01/04/12 0500 01/03/12 0448 01/02/12 2333 01/02/12 2225 01/02/12 2051 01/02/12 1805  NA 134* 132* 133* -- 139 137 --  K 3.9 3.9 -- -- -- -- --  CL 100 99 100 -- 106 104 --  CO2 20 22 21  -- -- -- 28  BUN 37* 32* 27* -- 24* 30* --  CREATININE 2.80* 2.32* 1.63* -- 1.50* 1.60* --  CALCIUM 8.1* 8.3* 8.8 -- -- -- 11.4*  MG 1.7 -- 2.3 1.1* -- -- --  PHOS -- -- 3.8 4.8* -- -- --   Intake/Output      09/18 0701 - 09/19 0700 09/19 0701 - 09/20 0700   I.V. (mL/kg) 1000.1 (17.1)    Other 25    NG/GT 435    IV Piggyback 235    Total Intake(mL/kg) 1695.1 (28.9)    Urine (mL/kg/hr) 145 (0.1)    Emesis/NG output 25    Total Output 170    Net +1525.1           A:  Acute renal failure, likely ATN due to hypotension from shock.  Also possible component of hepato-renal syndrome. Urine na 13  Patient continues to be oliguric. P:  -Avoid IV contrast, renally dose medications -Renal ultrasound shows no hydronephrosis -consider trial of lasix soon, for overload noted on exam, CT -Support MAP -start vasopressin for concern hepatorenal and albumin for now -normally would avoid para, however may need for mechanics weaning  GASTROINTESTINAL  Lab 01/04/12 0500 01/02/12 1805  AST 94* 37  ALT 16 9  ALKPHOS 162* 248*  BILITOT 1.4* 1.4*  PROT 5.3* 5.9*  ALBUMIN 1.5* 1.7*   A:  History of alcoholic cirrhosis, currently with significant ascites.  Suspected upper GI hemorrhage. P:   -greatly appreciate GI consult -continue tube feeds -continue protonix IV q12 May hold for weaning, tap  HEMATOLOGIC  Lab 01/05/12 0500 01/04/12 0500 01/03/12 0448 01/02/12 2333 01/02/12 2225 01/02/12 1805  HGB 10.8* 11.4* 12.0 11.7* 11.6* --  HCT 31.7* 33.1* 34.2* 34.0* 34.0* --  PLT 120* 215 254 229 -- 288  INR -- -- -- -- -- 1.19  APTT -- -- -- -- -- 28   A:  Acute blood loss anemia - patient transferred here with acute GI bleed.  Hb now stabilized, mildly downtrending daily likely due to anemia of chronic  disease Declining Platelet count - acute drop in platelets of unclear etiology.  No heparin use (no HIT), possible DIC vs ?sepsis, dilutional P:  -follow CBC's -Transfuse for hgb <7 -consider DIC panel after repeat coags eval  INFECTIOUS  Lab 01/05/12 0500 01/04/12 0500 01/03/12 0448 01/02/12 2333 01/02/12 2205 01/02/12 1805  WBC 12.1* 17.5* 33.2* 27.9* -- 22.0*  PROCALCITON -- -- -- -- 1.63 --   A: ?Septic shock vs hypovolemia - Unclear etiology; unlikely SBP (<250 WBC's in pleural fluid). Likely PNA, WBC downtreding, patient afebrile.  PCT 1.63 on 9/16 P:   -dc zosyn, dc vanc -add ceftriaxone, PNA treatment -avoid quinolone , see qtc -follow-up cultures -  left foot x-ray yesterday shows no evidence of osteo  ENDOCRINE  Lab 01/05/12 0811 01/05/12 0412 01/04/12 2350 01/04/12 2004 01/04/12 1628  GLUCAP 119* 126* 101* 112* 107*    A:  Presumed relative adrenal insufficiency.      Hyperglycemia P:   -Stress dose steroids with solu-cortef, taper  -SSI  NEUROLOGIC  A:  Acute encephalopathy (septic, metabolic) - mental status has greatly improved P:   -fentanyl prn for sedation NO benzo, no WD noted  BEST PRACTICE / DISPOSITION Level of Care:  ICU Primary Service:  PCCM Consultants:   GI Code Status:  Full Diet:  NPO DVT Px:  SCDs GI Px:  Protonix IV Skin Integrity: Left foot wound, wound care consult Social / Family:  None present. I called husband 9/18, updated in full.   Janalyn Harder, MD PGY-2, Internal Medicine 01/05/2012, 8:58 AM  Ccm time 30 min  I have fully examined this patient and agree with above findings.    And edited infull  Mcarthur Rossetti. Tyson Alias, MD, FACP Pgr: [phone] [place] [facility]

## 2012-01-05 NOTE — Progress Notes (Signed)
ANTIBIOTIC CONSULT NOTE - FOLLOW UP  Pharmacy Consult for Ceftriaxone Indication: pneumonia  No Known Allergies  Patient Measurements: Height: 5' (152.4 cm) Weight: 129 lb 3 oz (58.6 kg) IBW/kg (Calculated) : 45.5   Vital Signs: Temp: 97.4 F (36.3 C) (09/19 0800) Temp src: Core (Comment) (09/19 0600) BP: 93/70 mmHg (09/19 0800) Pulse Rate: 102  (09/19 0800) Intake/Output from previous day: 09/18 0701 - 09/19 0700 In: 1757.6 [I.V.:1020.1; NG/GT:465; IV Piggyback:247.5] Out: 190 [Urine:165; Emesis/NG output:25] Intake/Output from this shift: Total I/O In: 62.5 [I.V.:20; NG/GT:30; IV Piggyback:12.5] Out: -   Labs:  Basename 01/05/12 0500 01/04/12 0500 01/03/12 0448  WBC 12.1* 17.5* 33.2*  HGB 10.8* 11.4* 12.0  PLT 120* 215 254  LABCREA -- -- --  CREATININE 2.80* 2.32* 1.63*   Estimated Creatinine Clearance: 17.3 ml/min (by C-G formula based on Cr of 2.8). No results found for this basename: VANCOTROUGH:2,VANCOPEAK:2,VANCORANDOM:2,GENTTROUGH:2,GENTPEAK:2,GENTRANDOM:2,TOBRATROUGH:2,TOBRAPEAK:2,TOBRARND:2,AMIKACINPEAK:2,AMIKACINTROU:2,AMIKACIN:2, in the last 72 hours   Microbiology: Recent Results (from the past 720 hour(s))  CULTURE, BLOOD (ROUTINE X 2)     Status: Normal (Preliminary result)   Collection Time   01/02/12  5:50 PM      Component Value Range Status Comment   Specimen Description BLOOD ARM RIGHT   Final    Special Requests BOTTLES DRAWN AEROBIC ONLY 6CC   Final    Culture  Setup Time 01/02/2012 22:59   Final    Culture     Final    Value:        BLOOD CULTURE RECEIVED NO GROWTH TO DATE CULTURE WILL BE HELD FOR 5 DAYS BEFORE ISSUING A FINAL NEGATIVE REPORT   Report Status PENDING   Incomplete   URINE CULTURE     Status: Normal   Collection Time   01/02/12  5:54 PM      Component Value Range Status Comment   Specimen Description URINE, CLEAN CATCH   Final    Special Requests NONE   Final    Culture  Setup Time 01/02/2012 18:47   Final    Colony Count NO  GROWTH   Final    Culture NO GROWTH   Final    Report Status 01/03/2012 FINAL   Final   CULTURE, BLOOD (ROUTINE X 2)     Status: Normal (Preliminary result)   Collection Time   01/02/12  6:00 PM      Component Value Range Status Comment   Specimen Description BLOOD HAND RIGHT   Final    Special Requests BOTTLES DRAWN AEROBIC ONLY 3CC   Final    Culture  Setup Time 01/02/2012 22:59   Final    Culture     Final    Value:        BLOOD CULTURE RECEIVED NO GROWTH TO DATE CULTURE WILL BE HELD FOR 5 DAYS BEFORE ISSUING A FINAL NEGATIVE REPORT   Report Status PENDING   Incomplete   MRSA PCR SCREENING     Status: Normal   Collection Time   01/02/12 11:08 PM      Component Value Range Status Comment   MRSA by PCR NEGATIVE  NEGATIVE Final   BODY FLUID CULTURE     Status: Normal (Preliminary result)   Collection Time   01/03/12 11:18 AM      Component Value Range Status Comment   Specimen Description FLUID PARACENTESIS   Final    Special Requests NONE   Final    Gram Stain     Final    Value: NO  WBC SEEN     NO ORGANISMS SEEN   Culture NO GROWTH 1 DAY   Final    Report Status PENDING   Incomplete     Anti-infectives     Start     Dose/Rate Route Frequency Ordered Stop   01/03/12 2000   vancomycin (VANCOCIN) 500 mg in sodium chloride 0.9 % 100 mL IVPB  Status:  Discontinued        500 mg 100 mL/hr over 60 Minutes Intravenous Every 24 hours 01/02/12 2242 01/05/12 0956   01/03/12 0300   piperacillin-tazobactam (ZOSYN) IVPB 3.375 g  Status:  Discontinued        3.375 g 12.5 mL/hr over 240 Minutes Intravenous 3 times per day 01/02/12 2242 01/05/12 0959   01/02/12 1845   vancomycin (VANCOCIN) IVPB 1000 mg/200 mL premix        1,000 mg 200 mL/hr over 60 Minutes Intravenous  Once 01/02/12 1839 01/02/12 1959   01/02/12 1845  piperacillin-tazobactam (ZOSYN) IVPB 3.375 g       3.375 g 100 mL/hr over 30 Minutes Intravenous  Once 01/02/12 1839 01/02/12 1913          Assessment: Pneumonia -  Narrowing antimicrobial therapy from Vancomycin and Zosyn to Ceftriaxone.  She has developing renal insufficiency but Ceftriaxone does not require adjustment.  Goal of Therapy:  Eradication of infection  Plan:  Ceftriaxone 1gm IV q24h As no further dose adjustments are anticipated pharmacy will sign off.  Kathryn [name] 01/05/2012,10:01 AM

## 2012-01-06 ENCOUNTER — Inpatient Hospital Stay (HOSPITAL_COMMUNITY): Payer: Medicare Other

## 2012-01-06 DIAGNOSIS — W19XXXA Unspecified fall, initial encounter: Secondary | ICD-10-CM

## 2012-01-06 LAB — TYPE AND SCREEN
Donor AG Type: NEGATIVE
Donor AG Type: NEGATIVE
Donor AG Type: NEGATIVE
Unit division: 0
Unit division: 0
Unit division: 0

## 2012-01-06 LAB — BODY FLUID CULTURE
Culture: NO GROWTH
Gram Stain: NONE SEEN

## 2012-01-06 LAB — GLUCOSE, CAPILLARY
Glucose-Capillary: 131 mg/dL — ABNORMAL HIGH (ref 70–99)
Glucose-Capillary: 151 mg/dL — ABNORMAL HIGH (ref 70–99)
Glucose-Capillary: 79 mg/dL (ref 70–99)

## 2012-01-06 LAB — BASIC METABOLIC PANEL
BUN: 40 mg/dL — ABNORMAL HIGH (ref 6–23)
CO2: 20 mEq/L (ref 19–32)
Calcium: 7.8 mg/dL — ABNORMAL LOW (ref 8.4–10.5)
Chloride: 98 mEq/L (ref 96–112)
Creatinine, Ser: 2.65 mg/dL — ABNORMAL HIGH (ref 0.50–1.10)
GFR calc Af Amer: 22 mL/min — ABNORMAL LOW (ref >90)
GFR calc non Af Amer: 19 mL/min — ABNORMAL LOW (ref >90)
Glucose, Bld: 148 mg/dL — ABNORMAL HIGH (ref 70–99)
Potassium: 2.9 mEq/L — ABNORMAL LOW (ref 3.5–5.1)
Sodium: 133 mEq/L — ABNORMAL LOW (ref 135–145)

## 2012-01-06 LAB — CBC
HCT: 29 % — ABNORMAL LOW (ref 36.0–46.0)
Hemoglobin: 9.9 g/dL — ABNORMAL LOW (ref 12.0–15.0)
RBC: 3 MIL/uL — ABNORMAL LOW (ref 3.87–5.11)
WBC: 9.4 10*3/uL (ref 4.0–10.5)

## 2012-01-06 MED ORDER — POTASSIUM CHLORIDE 10 MEQ/50ML IV SOLN
10.0000 meq | INTRAVENOUS | Status: AC
  Administered 2012-01-06 (×4): 10 meq via INTRAVENOUS
  Filled 2012-01-06: qty 200

## 2012-01-06 MED ORDER — HYDROCORTISONE SOD SUCCINATE 100 MG IJ SOLR
25.0000 mg | Freq: Two times a day (BID) | INTRAMUSCULAR | Status: AC
  Administered 2012-01-06 – 2012-01-07 (×3): 25 mg via INTRAVENOUS
  Filled 2012-01-06 (×3): qty 0.5

## 2012-01-06 NOTE — Progress Notes (Signed)
Patient Name: Kathryn [name] Date of Encounter: 01/06/2012   Principal Problem:  *Septic shock Active Problems:  Acute renal failure  Coffee ground emesis  Acute respiratory failure  Myocardial infarction acute  HYPERLIPIDEMIA  ESSENTIAL HYPERTENSION  EtOH dependence  Hypomagnesemia  Alcoholic liver disease  Hypokalemia  Lactic acidosis  Fall  Hemorrhagic shock    SUBJECTIVE  No chest pain, sob.  Mild upper abdominal discomfort, tenderness - s/p paracentesis.  Has had right elbow pain and swelling - thinks she may have injured it in fall.  CURRENT MEDS    . albumin human  12.5 g Intravenous q12n4p  . albumin human  12.5 g Intravenous Once  . antiseptic oral rinse  15 mL Mouth Rinse QID  . cefTRIAXone (ROCEPHIN)  IV  1 g Intravenous Q24H  . chlorhexidine  15 mL Mouth Rinse BID  . feeding supplement (OXEPA)  1,000 mL Per Tube Q24H  . feeding supplement  30 mL Per Tube Q1200  . folic acid  1 mg Intravenous Daily  . furosemide  60 mg Intravenous BID  . hydrocortisone sodium succinate  25 mg Intravenous Q6H  . insulin aspart  0-15 Units Subcutaneous Q4H  . magnesium sulfate 1 - 4 g bolus IVPB  2 g Intravenous Once  . pantoprazole (PROTONIX) IV  40 mg Intravenous Q12H  . potassium chloride  10 mEq Intravenous Q1 Hr x 4  . thiamine  100 mg Intravenous Daily   OBJECTIVE  Filed Vitals:   01/06/12 0300 01/06/12 0400 01/06/12 0500 01/06/12 0600  BP:  83/59 81/58 82/60   Pulse: 26 74 85 73  Temp: 97.6 F (36.4 C) 97.6 F (36.4 C) 97.5 F (36.4 C) 97.5 F (36.4 C)  TempSrc:      Resp: 11 13 19 11   Height:      Weight:   126 lb 1.7 oz (57.2 kg)   SpO2: 75% 100% 98% 100%   CVP 12 (0400)  Intake/Output Summary (Last 24 hours) at 01/06/12 0713 Last data filed at 01/06/12 0600  Gross per 24 hour  Intake   1220 ml  Output   2670 ml  Net  -1450 ml   Filed Weights   01/04/12 0500 01/05/12 0400 01/06/12 0500  Weight: 126 lb 1.7 oz (57.2 kg) 129 lb 3 oz (58.6 kg) 126  lb 1.7 oz (57.2 kg)   PHYSICAL EXAM  General: Pleasant, NAD. Neuro: Alert and oriented X 3. Moves all extremities spontaneously. Psych: Normal affect. HEENT:  Normal  Neck: Supple without bruits.  Mod elevated JVD. Lungs:  Resp regular and unlabored, diminished bilat. Heart: RRR no s3, s4, or murmurs. Abdomen: Semi-firm, diffusely tender, BS + x 4.  Extremities: No clubbing, cyanosis or edema. Diminished periph pulses with mild diffuse UE/LE edema.  Accessory Clinical Findings  CBC  Basename 01/06/12 0428 01/05/12 0500 01/04/12 0500  WBC 9.4 12.1* --  NEUTROABS -- -- 15.6*  HGB 9.9* 10.8* --  HCT 29.0* 31.7* --  MCV 96.7 96.1 --  PLT 101* 120* --   Basic Metabolic Panel  Basename 01/06/12 0428 01/05/12 0500  NA 133* 134*  K 2.9* 3.9  CL 98 100  CO2 20 20  GLUCOSE 148* 128*  BUN 40* 37*  CREATININE 2.65* 2.80*  CALCIUM 7.8* 8.1*  MG -- 1.7  PHOS -- --   Liver Function Tests  Basename 01/04/12 0500  AST 94*  ALT 16  ALKPHOS 162*  BILITOT 1.4*  PROT 5.3*  ALBUMIN 1.5*  Cardiac Enzymes  Basename 01/03/12 2100 01/03/12 1515 01/03/12 1055  CKTOTAL 202* 341* --  CKMB 55.8* 84.5* --  CKMBINDEX -- -- --  TROPONINI 13.14* 18.98* >20.00*   TELE  rsr  Radiology/Studies  Dg Chest [place] 1 View  01/05/2012  *RADIOLOGY REPORT*  Clinical Data: Post paracentesis  PORTABLE CHEST - 1 VIEW   IMPRESSION: Cardiomegaly with pulmonary edema pattern.  Bilateral pleural effusions and associated consolidations; atelectasis versus pneumonia.  Appropriately positioned support devices.   Original Report Authenticated By: Waneta Martins, M.D.    ASSESSMENT AND PLAN  1.  Septic Shock:  Ongoing hypotn on vasopressin/neo.  Abx per CCM.  2.  NSTEMI:  No chest pain.  Nl EF by echo.  With ongoing hypotn, renal failure, and recent GIB, not a candidate for further eval @ this time.  Cont conservative Rx.  No asa 2/2 coffee ground emesis, no bb 2/2 hypotn, no statin 2/2 elevated  lft's, chronic liver dzs.  Consider myoview at some point when clinical situation improves.  3.  Hypokalemia:  Ongoing supplementation.  4.  NSVT/Proglonged QT:  No recurrence.  Supp K/Mg.  5.  Ascites:  S/p paracentesis.  6.  Anemia:  H/H drifting back down.   7.  R Elbow pain:  S/p fall.  Pt reports limited mobility.  Ecchymosis and swelling present.  Consider imaging.  Signed, Nicolasa Ducking NP  History and all data above reviewed.  Patient examined.  I agree with the findings as above.  The patient exam reveals COR:RRR, no rub  ,  Lungs: Clear  ,  Abd: Decreased distension, Ext Diffuse edema  .  All available labs, radiology testing, previous records reviewed. Agree with documented assessment and plan. Perhaps a noninvasive evaluation in the future but no acute therapies. We will follow from a distance.  Rollene Rotunda  7:56 AM  01/06/2012

## 2012-01-06 NOTE — Care Management Note (Signed)
    Page 1 of 1   01/06/2012     10:26:58 AM   CARE MANAGEMENT NOTE 01/06/2012  Patient:  Kathryn [name], Kathryn [name]   Account Number:  [id number]  Date Initiated:  01/03/2012  Documentation initiated by:  [facility]  Subjective/Objective Assessment:   AMS - - hypotensive - septic.  has husband.     Action/Plan:   Anticipated DC Date:  01/06/2012   Anticipated DC Plan:  HOME W HOME HEALTH SERVICES      DC Planning Services  CM consult      Choice offered to / List presented to:             Status of service:  In process, will continue to follow Medicare Important Message given?   (If response is "NO", the following Medicare IM given date fields will be blank) Date Medicare IM given:   Date Additional Medicare IM given:    Discharge Disposition:    Per UR Regulation:  Reviewed for med. necessity/level of care/duration of stay  If discussed at Long Length of Stay Meetings, dates discussed:    Comments:  ContactBriasia, Orie [phone]   [phone]                 Karely, Lammert Daughter [phone]  01-06-12 10:20am Avie Arenas, RNBSN [phone] Patient extubated on 9-19.  Talked with patient at bedside. Lives in a  house they have purchased, recently moved out of hotel.  Lives with husband who is drinker, smoker and HOH.  Has a walker and shower chair at home, has used [facility] in post post hip and elbow surgery.  Has been working with Aundra Millet - Artist here at [employer] on previous bills. Notifed Aundra Millet that she is here and now extubated and able to talk.

## 2012-01-06 NOTE — Progress Notes (Signed)
[facility] PROGRESS NOTE Subjective Extubated, alert, no complaints. Hungry.  Objective: Vital signs in last 24 hours: Temp:  [96.7 F (35.9 C)-97.7 F (36.5 C)] 97.5 F (36.4 C) (09/20 0600) Pulse Rate:  [26-136] 73  (09/20 0600) Resp:  [10-22] 11  (09/20 0600) BP: (78-103)/(38-80) 82/60 mmHg (09/20 0600) SpO2:  [75 %-100 %] 100 % (09/20 0600) Arterial Line BP: (68-112)/(57-89) 88/57 mmHg (09/20 0400) FiO2 (%):  [40 %] 40 % (09/19 1400) Weight:  [57.2 kg (126 lb 1.7 oz)] 57.2 kg (126 lb 1.7 oz) (09/20 0500)    Intake/Output from previous day: 09/19 0701 - 09/20 0700 In: 1220 [I.V.:545.5; NG/GT:30; IV Piggyback:644.5] Out: 2670 [Urine:2670] Intake/Output this shift:    PE: Gen-alert talkative, nonicteric Abd-distended, prob ascites nontender.  Lab Results:  Basename 01/06/12 0428 01/05/12 0500 01/04/12 0500  WBC 9.4 12.1* 17.5*  HGB 9.9* 10.8* 11.4*  HCT 29.0* 31.7* 33.1*  PLT 101* 120* 215   BMET  Basename 01/06/12 0428 01/05/12 0500 01/04/12 0500  NA 133* 134* 132*  K 2.9* 3.9 3.9  CL 98 100 99  CO2 20 20 22   CREATININE 2.65* 2.80* 2.32*   LFT  Basename 01/04/12 0500  PROT 5.3*  AST 94*  ALT 16  ALKPHOS 162*  BILITOT 1.4*  BILIDIR --  IBILI --   PT/INR  Basename 01/05/12 1030  LABPROT 16.4*  INR 1.35   PANCREAS No results found for this basename: LIPASE:3 in the last 72 hours       Studies/Results: US Renal Port  01/04/2012  *RADIOLOGY REPORT*  Clinical Data: Acute renal failure, history hypertension, fall of liver disease  RENAL/URINARY TRACT ULTRASOUND COMPLETE PORTABLE  Comparison:  Abdomen ultrasound 07/27/2011  Findings:  Right Kidney:  10.2 cm length.  Mild cortical thinning.  Increased cortical echogenicity.  No mass, hydronephrosis shadowing calcification.  Left Kidney:  1.7 cm length.  Cortical thinning.  Increased cortical echogenicity.  No mass, hydronephrosis shadowing calcification.  Bladder:  Decompressed by Foley  catheter, inadequately visualized.  Incidentally noted bilateral pleural effusions and significant ascites.  IMPRESSION: Medical renal disease changes of both kidneys. No evidence of renal mass or obstructive uropathy. Bilateral pleural effusions and significant ascites.   Original Report Authenticated By: Lollie Marrow, M.D.    Dg Chest Port 1 View  01/06/2012  *RADIOLOGY REPORT*  Clinical Data: Extubation.  Follow-up effusions.  PORTABLE CHEST - 1 VIEW  Comparison: Portable chest x-rays yesterday and dating back to 01/02/2012.  Findings: Interval extubation and nasogastric tube removal.  Right jugular central venous catheter tip projects over the SVC.  Cardiac silhouette upper normal in size to slightly enlarged but stable. Pulmonary venous hypertension with mild interstitial pulmonary edema, unchanged.  Moderately large bilateral pleural effusions, unchanged, with associated dense consolidation in the lower lobes. No new abnormalities.  Multiple old healed left rib fractures with chest wall deformity.  IMPRESSION: Stable mild CHF and/or fluid overload.  Stable moderately large bilateral pleural effusions and associated passive atelectasis and/or pneumonia in the lower lobes.  No new abnormalities.   Original Report Authenticated By: Arnell Sieving, M.D.    Dg Chest Port 1 View  01/05/2012  *RADIOLOGY REPORT*  Clinical Data: Post paracentesis  PORTABLE CHEST - 1 VIEW  Comparison: 01/04/2012  Findings: Endotracheal tube tip projects 2.5 cm proximal to the carina.  Right IJ catheter tip projects over the proximal SVC. Bilateral pleural effusions with associated consolidations.  No definite pneumothorax.  Cardiomegaly.  Central vascular congestion. Perihilar opacities.  Sequelae of prior posterior left rib fractures.  No interval osseous change.  IMPRESSION: Cardiomegaly with pulmonary edema pattern.  Bilateral pleural effusions and associated consolidations; atelectasis versus pneumonia.  Appropriately  positioned support devices.   Original Report Authenticated By: Waneta Martins, M.D.    Dg Chest Port 1 View  01/04/2012  *RADIOLOGY REPORT*  Clinical Data: Post adjustment of endotracheal tube  PORTABLE CHEST - 1 VIEW  Comparison: Portable exam 1250 hours compared to 0449 hours  Findings: Tip of endotracheal tube remains within 8 mm of the carina directed towards the right main stem bronchus. Nasogastric tube extends into stomach. Right jugular central venous catheter tip projecting over SVC. Severely rotated to the left. Stable heart size. Persistent dense atelectasis or consolidation of left lower lobe with persistent right basilar atelectasis. Cannot exclude small layered pleural effusions. No pneumothorax. Old healed left rib fractures.  IMPRESSION: Tip of endotracheal tube remains 8 mm of carina directed towards right mainstem bronchus, recommend withdrawal 2 cm. Otherwise little change.  Findings called to Norwood Levo RN on 2100 Unit on 1432 hours 01/04/2012 at 1432 hours.   Original Report Authenticated By: Lollie Marrow, M.D.    Dg Foot 2 Views Left  01/04/2012  *RADIOLOGY REPORT*  Clinical Data: Left foot swelling, on ventilator, question soft tissue gas  LEFT FOOT - 2 VIEW  Comparison: Portable exam 1253 hours without priors for comparison  Findings: Prior amputation of the second toe and third ray beyond the distal metatarsal. Screws present at second and fourth metatarsal heads. Diffuse osseous demineralization. Soft tissue lucency identified at great toe deep to proximal phalanx question ulcer. Probable prior bunion surgery distal first metatarsal. No definite fracture, dislocation or bone destruction. Mild degenerative changes at first TMT joint. No other soft tissue gas identified.  IMPRESSION: Prior amputations at the second and third toes. Soft tissue lucency identified at the plantar aspect of the foot at the level of the proximal phalanx of the great toe, question large ulcer, recommend  clinical correlation. No definite radiographic evidence of osteomyelitis.   Original Report Authenticated By: Lollie Marrow, M.D.     Medications: I have reviewed the patient's current medications.  Assessment/Plan: 1. Sepsis. Source unclear, on AB 2. UGI bleed. Recent EGD showed esophagitis no varices, Hg ok but slowly drifting down 3. Alcoholic Cirrhosis with intractable ascites.  Has recently required paracentesis by Dr Dulce Sellar due to renal insufficiency and increasing creatinine with diuretics.  Plan: We will follow. OK to feed, continue PPI. At some point will need to consider TIPS if repeated paracentesis needed but would defer that decision until back to baseline    Randi Poullard JR,Rielyn Krupinski L 01/06/2012, 8:08 AM

## 2012-01-06 NOTE — Progress Notes (Signed)
Name: Kathryn [name] MRN: [id number] DOB: [birth year]    LOS: 4  Referring Provider:  EDP Reason for Referral:  Shock  PULMONARY / CRITICAL CARE MEDICINE  Brief patient description:  61 yo with alcoholic liver disease presenting to [facility] ED s/p fall, found to be profoundly hypotensive, with coffee ground emesis.  Events Since Admission: 9/16 >>> Presenting to [facility] ED s/p fall, found to be profoundly hypotensive, with coffee ground emesis. 9/17 >>> Intubated for ARF 9/18 >>> Improved neuro and BP, started Oxepa feeds, off all pressors 9/19 >>> tolerating SBT well, paracentesis, extubated 9/20 >>> improved renal fxn on vasopressin  Lines/Tubes:  R IJ TLC 9/16 >>> L Radial Aline 9/16 >>> 9/19 ETT:  9/17 >>> 9/19 Foley:  9/16 >>>  Cultures: 9/16  Blood x2 >>> NGTD 9/16  Urine >>> No growth (final) 9/17  Ascites fluid >>> NGTD 9/16  Procalcitonin >> 1.63  Antibiotics: Vancomycin 9/16 >>> 9/19 Zosyn 9/16 >>> 9/19 Ceftriaxone 9/19 >>>stop 22  Subjective: Patient awake and following commands. Afebrile overnight. Currently hypotensive, but has no complaints of SOB, CP, HA, N/V.  Vital Signs: Temp:  [96.7 F (35.9 C)-97.7 F (36.5 C)] 97.4 F (36.3 C) (09/20 1000) Pulse Rate:  [26-136] 79  (09/20 1000) Resp:  [10-22] 19  (09/20 1000) BP: (74-103)/(38-80) 81/57 mmHg (09/20 1000) SpO2:  [75 %-100 %] 100 % (09/20 1000) Arterial Line BP: (68-112)/(57-89) 88/57 mmHg (09/20 0400) FiO2 (%):  [40 %] 40 % (09/19 1400) Weight:  [57.2 kg (126 lb 1.7 oz)] 57.2 kg (126 lb 1.7 oz) (09/20 0500) CVP:  [11 mmHg-12 mmHg] 12 mmHg  Physical Examination: General:  Lying in bed comfortably Neuro:  Awake, follows commands, moves all 4 extremities HEENT:  PERRL, dry membranes, Neck:  No JVD Cardiovascular:  Regular rhythm, tachycardic, no murmurs Lungs:  Clear bilaterally in upper lobes, diminished in bilateral bases. Abdomen:  Firm, distended, generalized mild ttp Skin:  .5 cm x .5 cm wound noted  to left plantar surface of the foot  Principal Problem:  *Septic shock Active Problems:  HYPERLIPIDEMIA  ESSENTIAL HYPERTENSION  EtOH dependence  Hypomagnesemia  Alcoholic liver disease  Hypokalemia  Lactic acidosis  Fall  Acute renal failure  Coffee ground emesis  Hemorrhagic shock  Acute respiratory failure  Myocardial infarction acute  ASSESSMENT AND PLAN PULMONARY  Lab 01/03/12 1115 01/03/12 0500 01/02/12 2320 01/02/12 2050  PHART 7.434 7.455* 7.390 7.470*  PCO2ART 35.7 30.5* 37.3 25.8*  PO2ART 94.0 78.0* 107.0* 78.0*  HCO3 24.0 21.5 22.5 18.9*  O2SAT 98.0 96.0 98.0 97.0   CXR:  9/20 >>> Stable mild CHF and/or fluid overload. Stable moderately large  bilateral pleural effusions and associated passive atelectasis  and/or pneumonia in the lower lobes. No new abnormalities. Improved effusion rt   A:   Acute Respiratory Failure with respiratory alkalosis, ARDS - resolving Paracentesis done on 9/19 - removed 5 L, helped respiratory mechanics Neg  Balance P:   Lasix started, continue this , follow effusions on pcxr pcxr in am   CARDIOVASCULAR A:  shock (septic vs hemorrhagic) - resolved Ventricular Arrythmia  NSTEMI - preserved EF of 55-60, unimpressive Prolonged qtc P:  -on vasopressin for concerns hepatorenal -Continue solu-cortef stress-dose steroids, taper further as remains off pressors -Hold ASA and heparin ( never received), per cards, bleeding on admission -lasix continue -KVO MAP goal 55 with good mental status, runs low, would also consider MAP 50   RENAL  Lab 01/06/12 0428 01/05/12 0500 01/04/12 0500 01/03/12  0448 01/02/12 2333 01/02/12 2225 01/02/12 1805  NA 133* 134* 132* 133* -- 139 --  K 2.9* 3.9 -- -- -- -- --  CL 98 100 99 100 -- 106 --  CO2 20 20 22 21  -- -- 28  BUN 40* 37* 32* 27* -- 24* --  CREATININE 2.65* 2.80* 2.32* 1.63* -- 1.50* --  CALCIUM 7.8* 8.1* 8.3* 8.8 -- -- 11.4*  MG -- 1.7 -- 2.3 1.1* -- --  PHOS -- -- -- 3.8 4.8* --  --   Intake/Output      09/19 0701 - 09/20 0700 09/20 0701 - 09/21 0700   I.V. (mL/kg) 570 (10) 73.5 (1.3)   Other     NG/GT 30    IV Piggyback 744.5 100   Total Intake(mL/kg) 1344.5 (23.5) 173.5 (3)   Urine (mL/kg/hr) 2670 (1.9) 140   Emesis/NG output     Total Output 2670 140   Net -1325.5 +33.5         A:   Acute renal failure, likely ATN due to hypotension from shock vs component of hepato-renal syndrome. Urine na 13   Renal ultrasound shows no hydronephrosis Calcium falsely low 2/2 low albumin P:  -Avoid IV contrast, renally dose medications -vasopressin to dc, if hepatorenal , likely would not turn around so fast -continue albumin x 24 hrs then dc -lasix maintain -K+ supplementation  GASTROINTESTINAL  Lab 01/04/12 0500 01/02/12 1805  AST 94* 37  ALT 16 9  ALKPHOS 162* 248*  BILITOT 1.4* 1.4*  PROT 5.3* 5.9*  ALBUMIN 1.5* 1.7*   A:   H/o alcoholic cirrhosis, currently with significant ascites.  Suspected upper GI hemorrhage. Paracentesis done on 9/19, 5 L removed P:   -greatly appreciate GI consult -continue protonix IV q12 No asa Add diet Dc  Vasopressin, see renal  HEMATOLOGIC  Lab 01/06/12 0428 01/05/12 1030 01/05/12 0500 01/04/12 0500 01/03/12 0448 01/02/12 2333 01/02/12 1805  HGB 9.9* -- 10.8* 11.4* 12.0 11.7* --  HCT 29.0* -- 31.7* 33.1* 34.2* 34.0* --  PLT 101* -- 120* 215 254 229 --  INR -- 1.35 -- -- -- -- 1.19  APTT -- 29 -- -- -- -- 28   A:   Acute blood loss anemia - acute GI bleed. Hb now stabilized, mildly downtrending daily likely due to anemia of chronic disease Declining Platelet count - acute drop in platelets of unclear etiology.  No heparin use (no HIT), possible DIC vs ?sepsis, dilutional P:  -CBC am -Transfuse for hgb <7 -scd - folic acid and thiamine   INFECTIOUS  Lab 01/06/12 0428 01/05/12 0500 01/04/12 0500 01/03/12 0448 01/02/12 2333 01/02/12 2205  WBC 9.4 12.1* 17.5* 33.2* 27.9* --  PROCALCITON -- -- -- -- -- 1.63    Cultures: 9/16  Blood x2 >>> NGTD 9/16  Urine >>> No growth (final) 9/17  Ascites fluid >>> NGTD 9/16  Procalcitonin >> 1.63  Antibiotics: Vancomycin 9/16 >>> 9/19 Zosyn 9/16 >>> 9/19 Ceftriaxone 9/19 >>>stop 22  A:  ?Septic shock vs hypovolemia - Unclear etiology; unlikely SBP (<250 WBC's in pleural fluid).  Likely PNA, WBC downtreding, patient afebrile.  PCT 1.63 on 9/16 -left foot x-ray yesterday shows no evidence of osteo P:   -add stop date ceftriaxone 22nd  ENDOCRINE  Lab 01/06/12 0752 01/06/12 0414 01/06/12 0011 01/05/12 2000 01/05/12 1545  GLUCAP 129* 151* 131* 138* 180*   A:   Presumed relative adrenal insufficiency. Hyperglycemia P:   -Stress dose steroids with solu-cortef, taper  To 25 q12h -SSI  NEUROLOGIC A:   Acute encephalopathy- resolved P:   Pt Out of bed chair  BEST PRACTICE / DISPOSITION Level of Care:  ICU to sdu Primary Service:  PCCM to traid Consultants:   GI and Cardiology Code Status:  Full Diet:  NPO DVT Px:  SCDs GI Px:  Protonix IV Skin Integrity: Left foot wound, wound care consult Social / Family: Husband present  Verlin Grills, Medical Student 01/06/2012, 10:31 AM  Ccm time 30 min   I have fully examined this patient and agree with above findings.    And edited infull  Mcarthur Rossetti. Tyson Alias, MD, FACP Pgr: [phone] [place] [facility]

## 2012-01-07 ENCOUNTER — Inpatient Hospital Stay (HOSPITAL_COMMUNITY): Payer: Medicare Other

## 2012-01-07 DIAGNOSIS — K709 Alcoholic liver disease, unspecified: Secondary | ICD-10-CM

## 2012-01-07 LAB — COMPREHENSIVE METABOLIC PANEL
BUN: 38 mg/dL — ABNORMAL HIGH (ref 6–23)
CO2: 24 mEq/L (ref 19–32)
Calcium: 7.9 mg/dL — ABNORMAL LOW (ref 8.4–10.5)
Creatinine, Ser: 2.3 mg/dL — ABNORMAL HIGH (ref 0.50–1.10)
GFR calc Af Amer: 26 mL/min — ABNORMAL LOW (ref >90)
GFR calc non Af Amer: 22 mL/min — ABNORMAL LOW (ref >90)
Glucose, Bld: 113 mg/dL — ABNORMAL HIGH (ref 70–99)

## 2012-01-07 LAB — CBC WITH DIFFERENTIAL/PLATELET
Basophils Relative: 0 % (ref 0–1)
Hemoglobin: 9.7 g/dL — ABNORMAL LOW (ref 12.0–15.0)
Lymphs Abs: 0.8 10*3/uL (ref 0.7–4.0)
Monocytes Relative: 7 % (ref 3–12)
Neutro Abs: 9 10*3/uL — ABNORMAL HIGH (ref 1.7–7.7)
Neutrophils Relative %: 86 % — ABNORMAL HIGH (ref 43–77)
RBC: 2.93 MIL/uL — ABNORMAL LOW (ref 3.87–5.11)

## 2012-01-07 LAB — GLUCOSE, CAPILLARY
Glucose-Capillary: 185 mg/dL — ABNORMAL HIGH (ref 70–99)
Glucose-Capillary: 87 mg/dL (ref 70–99)

## 2012-01-07 MED ORDER — SPIRONOLACTONE 25 MG PO TABS: 25.0000 mg | ORAL_TABLET | Freq: Every day | ORAL | Status: DC

## 2012-01-07 MED ORDER — PREDNISONE 20 MG PO TABS
20.0000 mg | ORAL_TABLET | Freq: Every day | ORAL | Status: DC
  Administered 2012-01-08: 20 mg via ORAL
  Filled 2012-01-07 (×2): qty 1

## 2012-01-07 MED ORDER — FOLIC ACID 1 MG PO TABS
1.0000 mg | ORAL_TABLET | Freq: Every day | ORAL | Status: DC
  Administered 2012-01-07 – 2012-01-12 (×6): 1 mg via ORAL
  Filled 2012-01-07 (×6): qty 1

## 2012-01-07 MED ORDER — POTASSIUM CHLORIDE CRYS ER 20 MEQ PO TBCR
40.0000 meq | EXTENDED_RELEASE_TABLET | Freq: Two times a day (BID) | ORAL | Status: AC
  Administered 2012-01-07 – 2012-01-08 (×3): 40 meq via ORAL
  Filled 2012-01-07 (×3): qty 2

## 2012-01-07 MED ORDER — PANTOPRAZOLE SODIUM 40 MG PO TBEC
40.0000 mg | DELAYED_RELEASE_TABLET | Freq: Two times a day (BID) | ORAL | Status: DC
  Administered 2012-01-07 – 2012-01-12 (×10): 40 mg via ORAL
  Filled 2012-01-07 (×9): qty 1

## 2012-01-07 MED ORDER — SPIRONOLACTONE 25 MG PO TABS
25.0000 mg | ORAL_TABLET | Freq: Every day | ORAL | Status: DC
  Administered 2012-01-08: 25 mg via ORAL
  Filled 2012-01-07 (×2): qty 1

## 2012-01-07 MED ORDER — OXYCODONE HCL 5 MG PO TABS
5.0000 mg | ORAL_TABLET | ORAL | Status: DC | PRN
  Administered 2012-01-07 – 2012-01-12 (×22): 5 mg via ORAL
  Filled 2012-01-07 (×22): qty 1

## 2012-01-07 MED ORDER — POTASSIUM CHLORIDE 10 MEQ/50ML IV SOLN
10.0000 meq | INTRAVENOUS | Status: AC
  Administered 2012-01-07 (×4): 10 meq via INTRAVENOUS
  Filled 2012-01-07: qty 200

## 2012-01-07 MED ORDER — VITAMIN B-1 100 MG PO TABS
100.0000 mg | ORAL_TABLET | Freq: Every day | ORAL | Status: DC
  Administered 2012-01-07 – 2012-01-12 (×6): 100 mg via ORAL
  Filled 2012-01-07 (×6): qty 1

## 2012-01-07 MED ORDER — INSULIN ASPART 100 UNIT/ML ~~LOC~~ SOLN
0.0000 [IU] | Freq: Three times a day (TID) | SUBCUTANEOUS | Status: DC
  Administered 2012-01-07: 3 [IU] via SUBCUTANEOUS
  Administered 2012-01-08: 2 [IU] via SUBCUTANEOUS
  Administered 2012-01-08 (×2): 3 [IU] via SUBCUTANEOUS
  Administered 2012-01-09 – 2012-01-10 (×5): 2 [IU] via SUBCUTANEOUS
  Administered 2012-01-11 (×2): 3 [IU] via SUBCUTANEOUS

## 2012-01-07 NOTE — Progress Notes (Signed)
[facility] ADULT ICU REPLACEMENT PROTOCOL FOR AM LAB REPLACEMENT ONLY  The patient does not apply for the [facility] Adult ICU Electrolyte Replacment Protocol based on the criteria listed below:   3. Is BUN < 30 mg/dL? no  Patient's BUN today is 38 6. If a panic level lab has been reported, has the CCM MD in charge been notified? yes.   Physician:  Raquel Sarna, Carney Bern P 01/07/2012 6:09 AM

## 2012-01-07 NOTE — Progress Notes (Signed)
[facility] Progress Note [place] TEAM 1 - Stepdown/ICU TEAM   Kathryn [name] WUJ:[id number] DOB: 03/29/1951 DOA: 01/02/2012 PCP: Willow Ora, MD  Brief narrative: 61 yo with alcoholic liver disease presenting to [facility] ED s/p fall, found to be profoundly hypotensive, with coffee ground emesis.  Events Since Admission:  9/16 >>> Presenting to [facility] ED s/p fall, found to be profoundly hypotensive, with coffee ground emesis.  9/17 >>> Intubated for ARF  9/18 >>> Improved neuro and BP, started Oxepa feeds, off all pressors  9/19 >>> tolerating SBT well, paracentesis, extubated  9/20 >>> improved renal fxn on vasopressin  Assessment/Plan:  Acute Respiratory Failure with respiratory alkalosis, ARDS Resolved - extubated and doing well  B pleural effusions Continue lasix - follow serial CXRs - watch Is/Os  Septic vs/ hemorrhagic shock Taper stress dose steroids - unclear etiology; unlikely SBP - appears to have resolved  NSTEMI EF preserved without clear wall motion abnormality on echo - conservative therapy - holding any ASA or heparin with recent GI bleed - Cardiology following  Ventricular arrythmia Improved after Mg replacement  Prolonged QT  Cardiology following  Acute renal failure  No hydro on renal US - renal fxn slowly improving - not felt to be hepato-renal  Alcoholic cirrhosis w/ intractable ascites At some point will need to consider TIPS if repeated paracentesis needed but would defer that decision until back to baseline - GI following  Acute blood loss anemia Follow Hgb trend - holding steady for now  UGIB Recent EGD revealed esophagitis but not varices - Gi following  Thrombocytopenia Due to EtOH liver disease - stable at present   hypokalemia Due to diuretic - replace and follow  Hyperglycemia Steroid induced - cont SSI until off steroids  Toxic metabolic encephalopathy Resolved - due to above acute issues - check ammonia, B12, folate in f/u   Code  Status: Full Disposition Plan: transfer to tele bed as written by PCCM on 01/06/2012  Consultants: GI PCCM Cardiology  Procedures: R IJ TLC 9/16 >>>  L Radial Aline 9/16 >>> 9/19  ETT: 9/17 >>> 9/19  Foley: 9/16 >>> Paracentesis 9/19 - removed 5 L  Antibiotics: Vancomycin 9/16 >>> 9/19  Zosyn 9/16 >>> 9/19  Ceftriaxone 9/19 >>>stop 22  DVT prophylaxis: SCDs in setting of acute bleed  HPI/Subjective: Pt is alert and conversant.  She is pleasant.  She denies sob, cp, n/v, or abdom pain.  She feels very weak in general.   Objective: Blood pressure 84/55, pulse 82, temperature 97.4 F (36.3 C), temperature source Oral, resp. rate 17, height 5' (1.524 m), weight 51.619 kg (113 lb 12.8 oz), SpO2 99.00%.  Intake/Output Summary (Last 24 hours) at 01/07/12 1304 Last data filed at 01/07/12 1034  Gross per 24 hour  Intake   1326 ml  Output   1395 ml  Net    -69 ml     Exam: General: No acute respiratory distress at rest Lungs: Clear to auscultation bilaterally without wheezes or crackles w/ exception to poor air movement in B bases Cardiovascular: Regular rate and rhythm without murmur gallop or rub  Abdomen: Nontender, nondistended, soft, bowel sounds positive, no rebound, no appreciable mass Extremities: No significant cyanosis, clubbing, or edema bilateral lower extremities - multiple scattered echymoses  Data Reviewed: Basic Metabolic Panel:  Lab 01/07/12 9562 01/06/12 0428 01/05/12 0500 01/04/12 0500 01/03/12 0448 01/02/12 2333  NA 140 133* 134* 132* 133* --  K 2.8* 2.9* 3.9 3.9 4.3 --  CL 104  98 100 99 100 --  CO2 24 20 20 22 21  --  GLUCOSE 113* 148* 128* 139* 200* --  BUN 38* 40* 37* 32* 27* --  CREATININE 2.30* 2.65* 2.80* 2.32* 1.63* --  CALCIUM 7.9* 7.8* 8.1* 8.3* 8.8 --  MG -- -- 1.7 -- 2.3 1.1*  PHOS -- -- -- -- 3.8 4.8*   Liver Function Tests:  Lab 01/07/12 0525 01/04/12 0500 01/02/12 1805  AST 28 94* 37  ALT 10 16 9   ALKPHOS 109 162* 248*  BILITOT  0.9 1.4* 1.4*  PROT 4.5* 5.3* 5.9*  ALBUMIN 2.2* 1.5* 1.7*   CBC:  Lab 01/07/12 0525 01/06/12 0428 01/05/12 0500 01/04/12 0500 01/03/12 0448 01/02/12 1805  WBC 10.5 9.4 12.1* 17.5* 33.2* --  NEUTROABS 9.0* -- -- 15.6* -- 19.9*  HGB 9.7* 9.9* 10.8* 11.4* 12.0 --  HCT 28.2* 29.0* 31.7* 33.1* 34.2* --  MCV 96.2 96.7 96.1 96.5 94.5 --  PLT 97* 101* 120* 215 254 --   Cardiac Enzymes:  Lab 01/03/12 2100 01/03/12 1515 01/03/12 1055  CKTOTAL 202* 341* --  CKMB 55.8* 84.5* --  CKMBINDEX -- -- --  TROPONINI 13.14* 18.98* >20.00*   CBG:  Lab 01/07/12 0722 01/07/12 0357 01/07/12 0007 01/06/12 1954 01/06/12 1535  GLUCAP 124* 87 185* 79 174*    Recent Results (from the past 240 hour(s))  CULTURE, BLOOD (ROUTINE X 2)     Status: Normal (Preliminary result)   Collection Time   01/02/12  5:50 PM      Component Value Range Status Comment   Specimen Description BLOOD ARM RIGHT   Final    Special Requests BOTTLES DRAWN AEROBIC ONLY 6CC   Final    Culture  Setup Time 01/02/2012 22:59   Final    Culture     Final    Value:        BLOOD CULTURE RECEIVED NO GROWTH TO DATE CULTURE WILL BE HELD FOR 5 DAYS BEFORE ISSUING A FINAL NEGATIVE REPORT   Report Status PENDING   Incomplete   URINE CULTURE     Status: Normal   Collection Time   01/02/12  5:54 PM      Component Value Range Status Comment   Specimen Description URINE, CLEAN CATCH   Final    Special Requests NONE   Final    Culture  Setup Time 01/02/2012 18:47   Final    Colony Count NO GROWTH   Final    Culture NO GROWTH   Final    Report Status 01/03/2012 FINAL   Final   CULTURE, BLOOD (ROUTINE X 2)     Status: Normal (Preliminary result)   Collection Time   01/02/12  6:00 PM      Component Value Range Status Comment   Specimen Description BLOOD HAND RIGHT   Final    Special Requests BOTTLES DRAWN AEROBIC ONLY 3CC   Final    Culture  Setup Time 01/02/2012 22:59   Final    Culture     Final    Value:        BLOOD CULTURE RECEIVED NO  GROWTH TO DATE CULTURE WILL BE HELD FOR 5 DAYS BEFORE ISSUING A FINAL NEGATIVE REPORT   Report Status PENDING   Incomplete   MRSA PCR SCREENING     Status: Normal   Collection Time   01/02/12 11:08 PM      Component Value Range Status Comment   MRSA by PCR NEGATIVE  NEGATIVE Final  BODY FLUID CULTURE     Status: Normal   Collection Time   01/03/12 11:18 AM      Component Value Range Status Comment   Specimen Description FLUID PARACENTESIS   Final    Special Requests NONE   Final    Gram Stain     Final    Value: NO WBC SEEN     NO ORGANISMS SEEN   Culture NO GROWTH 3 DAYS   Final    Report Status 01/06/2012 FINAL   Final      Studies:  Recent x-ray studies have been reviewed in detail by the Attending Physician  Scheduled Meds:  Reviewed in detail by the Attending Physician   Lonia Blood, MD [facility] Office  [phone] Pager [phone]  On-Call/Text Page:      [email or web address]      password [email or web address]  If 7PM-7AM, please contact night-coverage [email or web address] Password [email or web address] 01/07/2012, 1:04 PM   LOS: 5 days

## 2012-01-07 NOTE — Progress Notes (Signed)
Hypokalemia   K replaced  

## 2012-01-07 NOTE — Progress Notes (Signed)
Kathryn [name] 3:18 PM  Subjective: The patient is doing better and does not have signs of bleeding and currently has no complaints  Objective: Vital signs stable afebrile no acute distress except blood pressure a little low abdomen is soft nontender some ascites hemoglobin stable BUN and creatinine slightly improved  Assessment: Multiple medical problems including cirrhosis from alcohol  Plan: Will continue to follow but with no signs of further bleeding can hold repeat endoscopy for now and please call me this weekend if I can be of any further assistance  [facility] E

## 2012-01-08 ENCOUNTER — Inpatient Hospital Stay (HOSPITAL_COMMUNITY): Payer: Medicare Other

## 2012-01-08 DIAGNOSIS — E876 Hypokalemia: Secondary | ICD-10-CM

## 2012-01-08 DIAGNOSIS — K920 Hematemesis: Secondary | ICD-10-CM

## 2012-01-08 DIAGNOSIS — E43 Unspecified severe protein-calorie malnutrition: Secondary | ICD-10-CM

## 2012-01-08 LAB — GLUCOSE, CAPILLARY: Glucose-Capillary: 142 mg/dL — ABNORMAL HIGH (ref 70–99)

## 2012-01-08 LAB — CBC
HCT: 33.1 % — ABNORMAL LOW (ref 36.0–46.0)
MCHC: 33.8 g/dL (ref 30.0–36.0)
MCV: 97.6 fL (ref 78.0–100.0)
RDW: 16.7 % — ABNORMAL HIGH (ref 11.5–15.5)

## 2012-01-08 LAB — COMPREHENSIVE METABOLIC PANEL
ALT: 11 U/L (ref 0–35)
Albumin: 2.1 g/dL — ABNORMAL LOW (ref 3.5–5.2)
Alkaline Phosphatase: 135 U/L — ABNORMAL HIGH (ref 39–117)
GFR calc Af Amer: 39 mL/min — ABNORMAL LOW (ref >90)
Glucose, Bld: 215 mg/dL — ABNORMAL HIGH (ref 70–99)
Potassium: 3.8 mEq/L (ref 3.5–5.1)
Sodium: 136 mEq/L (ref 135–145)
Total Protein: 4.5 g/dL — ABNORMAL LOW (ref 6.0–8.3)

## 2012-01-08 LAB — CULTURE, BLOOD (ROUTINE X 2): Culture: NO GROWTH

## 2012-01-08 MED ORDER — MAGNESIUM SULFATE 40 MG/ML IJ SOLN
2.0000 g | Freq: Once | INTRAMUSCULAR | Status: AC
  Administered 2012-01-08: 2 g via INTRAVENOUS
  Filled 2012-01-08: qty 50

## 2012-01-08 MED ORDER — PREDNISONE 10 MG PO TABS
10.0000 mg | ORAL_TABLET | Freq: Every day | ORAL | Status: DC
  Administered 2012-01-09: 10 mg via ORAL
  Filled 2012-01-08 (×2): qty 1

## 2012-01-08 NOTE — Progress Notes (Signed)
SUBJECTIVE:  Today is her best day so far.  No chest pain.  No SOB   PHYSICAL EXAM Filed Vitals:   01/07/12 1000 01/07/12 1257 01/07/12 2207 01/08/12 0613  BP: 77/50 84/55 80/58  84/56  Pulse: 85 82 72 80  Temp: 97.8 F (36.6 C) 97.4 F (36.3 C) 98.1 F (36.7 C) 98.3 F (36.8 C)  TempSrc:  Oral Oral Oral  Resp: 16 17 18 18   Height:  5' (1.524 m)    Weight:  113 lb 12.8 oz (51.619 kg)  113 lb 12.1 oz (51.6 kg)  SpO2: 100% 99% 100% 99%   General:  No distress HEENT:  PERRL Lungs:  Few basilar crackles Heart:  RRR Abdomen:  Positive bowel sounds, no rebound no guarding Extremities:  Decreased pulses with no edema.  Neuro:  Nonfocal  LABS:  Results for orders placed during the hospital encounter of 01/02/12 (from the past 24 hour(s))  GLUCOSE, CAPILLARY     Status: Abnormal   Collection Time   01/07/12 12:05 PM      Component Value Range   Glucose-Capillary 116 (*) 70 - 99 mg/dL  GLUCOSE, CAPILLARY     Status: Abnormal   Collection Time   01/07/12  3:52 PM      Component Value Range   Glucose-Capillary 162 (*) 70 - 99 mg/dL  GLUCOSE, CAPILLARY     Status: Abnormal   Collection Time   01/07/12  9:52 PM      Component Value Range   Glucose-Capillary 122 (*) 70 - 99 mg/dL   Comment 1 Notify RN    AMMONIA     Status: Normal   Collection Time   01/08/12  4:20 AM      Component Value Range   Ammonia 49  11 - 60 umol/L  COMPREHENSIVE METABOLIC PANEL     Status: Abnormal   Collection Time   01/08/12  4:21 AM      Component Value Range   Sodium 136  135 - 145 mEq/L   Potassium 3.8  3.5 - 5.1 mEq/L   Chloride 102  96 - 112 mEq/L   CO2 20  19 - 32 mEq/L   Glucose, Bld 215 (*) 70 - 99 mg/dL   BUN 35 (*) 6 - 23 mg/dL   Creatinine, Ser 6.96 (*) 0.50 - 1.10 mg/dL   Calcium 7.8 (*) 8.4 - 10.5 mg/dL   Total Protein 4.5 (*) 6.0 - 8.3 g/dL   Albumin 2.1 (*) 3.5 - 5.2 g/dL   AST 28  0 - 37 U/L   ALT 11  0 - 35 U/L   Alkaline Phosphatase 135 (*) 39 - 117 U/L   Total  Bilirubin 0.6  0.3 - 1.2 mg/dL   GFR calc non Af Amer 34 (*) >90 mL/min   GFR calc Af Amer 39 (*) >90 mL/min  CBC     Status: Abnormal   Collection Time   01/08/12  4:21 AM      Component Value Range   WBC 13.3 (*) 4.0 - 10.5 K/uL   RBC 3.39 (*) 3.87 - 5.11 MIL/uL   Hemoglobin 11.2 (*) 12.0 - 15.0 g/dL   HCT 29.5 (*) 28.4 - 13.2 %   MCV 97.6  78.0 - 100.0 fL   MCH 33.0  26.0 - 34.0 pg   MCHC 33.8  30.0 - 36.0 g/dL   RDW 44.0 (*) 10.2 - 72.5 %   Platelets 115 (*) 150 - 400 K/uL  MAGNESIUM     Status: Normal   Collection Time   01/08/12  4:21 AM      Component Value Range   Magnesium 1.6  1.5 - 2.5 mg/dL  PHOSPHORUS     Status: Normal   Collection Time   01/08/12  4:21 AM      Component Value Range   Phosphorus 2.3  2.3 - 4.6 mg/dL  GLUCOSE, CAPILLARY     Status: Abnormal   Collection Time   01/08/12  6:38 AM      Component Value Range   Glucose-Capillary 142 (*) 70 - 99 mg/dL   Comment 1 Notify RN      Intake/Output Summary (Last 24 hours) at 01/08/12 1050 Last data filed at 01/08/12 0615  Gross per 24 hour  Intake    720 ml  Output   2550 ml  Net  -1830 ml    EKG:  NSR rate 85, QTc prolonged.  No acute ST T wave changes.  No change from previous.  01/08/2012  ASSESSMENT AND PLAN:  NQWMI - EF preserved without clear wall motion abnormality on echo.  Conservative therapy.  Holding any ASA or heparin with recent GI bleed.    NSVT - Telemetry reviewed. She had four beats of Vtach.  Supplement magnesium which is 1.6  Prolonged QT - Her QT is  Still very prolonged.  She has not had any arrhythmia related to this however.  There are no meds that would obviously be contributing.  She has no history consistent with long QT syndrome.    Kathryn [name] [facility] 01/08/2012 10:50 AM

## 2012-01-08 NOTE — Progress Notes (Signed)
Kathryn [name] 10:37 AM  Subjective: The patient is asymptomatic and feeling better eating well and no signs of bleeding  Objective: Vital signs stable afebrile abdomen is soft nontender mild ascites hemoglobin increased BUN/creatinine decreased  Assessment: Multiple medical problem  Plan: Endoscopy when necessary otherwise continue present management and can allow Dr. Dulce Sellar her primary gastroenterologist to decide the timing of outpatient endoscopy if needed  [facility] E

## 2012-01-08 NOTE — Progress Notes (Signed)
Dr Ardyth Harps called informed of patient's increased fluid in abd , upper thighs and rt hip. Dsg changed x4 for saturation with copious amt of serosanguineous drainage.

## 2012-01-08 NOTE — Progress Notes (Addendum)
[facility] Progress Note   BRYNLEA KREISHER ZOX:[id number] DOB: Apr 28, 1950 DOA: 01/02/2012 PCP: Willow Ora, MD  Brief narrative: 61 yo with alcoholic liver disease presenting to [facility] ED s/p fall, found to be profoundly hypotensive, with coffee ground emesis.  Events Since Admission:  9/16 >>> Presenting to [facility] ED s/p fall, found to be profoundly hypotensive, with coffee ground emesis.  9/17 >>> Intubated for ARF  9/18 >>> Improved neuro and BP, started Oxepa feeds, off all pressors  9/19 >>> tolerating SBT well, paracentesis, extubated  9/20 >>> improved renal fxn on vasopressin  Assessment/Plan:  Acute Respiratory Failure with respiratory alkalosis, ARDS Resolved - extubated and doing well  B pleural effusions Continue lasix - follow serial CXRs - watch Is/Os  Septic vs/ hemorrhagic shock Taper stress dose steroids  (decreased prednisone from 20 mg to 10 mg for tomorrow)- unclear etiology; unlikely SBP, also possibly PNA - appears to have resolved. Last dose of rocephin is scheduled to be today. BP improved from 60s to 80s overnight.  NSTEMI EF preserved without clear wall motion abnormality on echo - conservative therapy - holding any ASA or heparin with recent GI bleed - Cardiology following. No BB given hypotension, no statin given increased LFTs.  Ventricular arrythmia Improved after Mg/K replacement  Prolonged QT  Cardiology following  Acute renal failure  No hydro on renal US - renal fxn improving. Cr close to her baseline today of 1.5 (1.62 today).  Alcoholic cirrhosis w/ intractable ascites At some point will need to consider TIPS if repeated paracentesis needed but would defer that decision until back to baseline - GI following  Acute blood loss anemia Follow Hgb trend - holding steady for now; actually has increased.  UGIB Recent EGD revealed esophagitis but not varices - Gi following. Continue protonix  Thrombocytopenia Due to EtOH liver disease - stable at  present   hypokalemia Due to diuretic - replaced.  Hyperglycemia Steroid induced - cont SSI until off steroids  Toxic metabolic encephalopathy Resolved - due to above acute issues - check ammonia, B12, folate in f/u   Disposition Start PT and OT for dispo planning.  Code Status: Full Consultants: GI PCCM Cardiology  Antibiotics: Vancomycin 9/16 >>> 9/19  Zosyn 9/16 >>> 9/19  Ceftriaxone 9/19 >>>stop 22  DVT prophylaxis: SCDs in setting of acute bleed  HPI/Subjective: Pt is alert and conversant.  She is pleasant.  She denies sob, cp, n/v, or abdom pain.  She feels very weak in general.   Objective: Blood pressure 84/56, pulse 80, temperature 98.3 F (36.8 C), temperature source Oral, resp. rate 18, height 5' (1.524 m), weight 51.6 kg (113 lb 12.1 oz), SpO2 99.00%.  Intake/Output Summary (Last 24 hours) at 01/08/12 0952 Last data filed at 01/08/12 0615  Gross per 24 hour  Intake   1070 ml  Output   2550 ml  Net  -1480 ml     Exam: General: No acute respiratory distress at rest Lungs: Clear to auscultation bilaterally without wheezes or crackles w/ exception to poor air movement in B bases Cardiovascular: Regular rate and rhythm without murmur gallop or rub  Abdomen: Nontender, distended, soft, bowel sounds positive, no rebound, no appreciable mass Extremities: No significant cyanosis, clubbing, or edema bilateral lower extremities - multiple scattered echymoses  Data Reviewed: Basic Metabolic Panel:  Lab 01/08/12 8119 01/07/12 0525 01/06/12 0428 01/05/12 0500 01/04/12 0500 01/03/12 0448 01/02/12 2333  NA 136 140 133* 134* 132* -- --  K 3.8 2.8* 2.9* 3.9  3.9 -- --  CL 102 104 98 100 99 -- --  CO2 20 24 20 20 22  -- --  GLUCOSE 215* 113* 148* 128* 139* -- --  BUN 35* 38* 40* 37* 32* -- --  CREATININE 1.62* 2.30* 2.65* 2.80* 2.32* -- --  CALCIUM 7.8* 7.9* 7.8* 8.1* 8.3* -- --  MG 1.6 -- -- 1.7 -- 2.3 1.1*  PHOS 2.3 -- -- -- -- 3.8 4.8*   Liver Function  Tests:  Lab 01/08/12 0421 01/07/12 0525 01/04/12 0500 01/02/12 1805  AST 28 28 94* 37  ALT 11 10 16 9   ALKPHOS 135* 109 162* 248*  BILITOT 0.6 0.9 1.4* 1.4*  PROT 4.5* 4.5* 5.3* 5.9*  ALBUMIN 2.1* 2.2* 1.5* 1.7*   CBC:  Lab 01/08/12 0421 01/07/12 0525 01/06/12 0428 01/05/12 0500 01/04/12 0500 01/02/12 1805  WBC 13.3* 10.5 9.4 12.1* 17.5* --  NEUTROABS -- 9.0* -- -- 15.6* 19.9*  HGB 11.2* 9.7* 9.9* 10.8* 11.4* --  HCT 33.1* 28.2* 29.0* 31.7* 33.1* --  MCV 97.6 96.2 96.7 96.1 96.5 --  PLT 115* 97* 101* 120* 215 --   Cardiac Enzymes:  Lab 01/03/12 2100 01/03/12 1515 01/03/12 1055  CKTOTAL 202* 341* --  CKMB 55.8* 84.5* --  CKMBINDEX -- -- --  TROPONINI 13.14* 18.98* >20.00*   CBG:  Lab 01/08/12 0638 01/07/12 2152 01/07/12 1552 01/07/12 1205 01/07/12 0722  GLUCAP 142* 122* 162* 116* 124*    Recent Results (from the past 240 hour(s))  CULTURE, BLOOD (ROUTINE X 2)     Status: Normal (Preliminary result)   Collection Time   01/02/12  5:50 PM      Component Value Range Status Comment   Specimen Description BLOOD ARM RIGHT   Final    Special Requests BOTTLES DRAWN AEROBIC ONLY 6CC   Final    Culture  Setup Time 01/02/2012 22:59   Final    Culture     Final    Value:        BLOOD CULTURE RECEIVED NO GROWTH TO DATE CULTURE WILL BE HELD FOR 5 DAYS BEFORE ISSUING A FINAL NEGATIVE REPORT   Report Status PENDING   Incomplete   URINE CULTURE     Status: Normal   Collection Time   01/02/12  5:54 PM      Component Value Range Status Comment   Specimen Description URINE, CLEAN CATCH   Final    Special Requests NONE   Final    Culture  Setup Time 01/02/2012 18:47   Final    Colony Count NO GROWTH   Final    Culture NO GROWTH   Final    Report Status 01/03/2012 FINAL   Final   CULTURE, BLOOD (ROUTINE X 2)     Status: Normal (Preliminary result)   Collection Time   01/02/12  6:00 PM      Component Value Range Status Comment   Specimen Description BLOOD HAND RIGHT   Final    Special  Requests BOTTLES DRAWN AEROBIC ONLY 3CC   Final    Culture  Setup Time 01/02/2012 22:59   Final    Culture     Final    Value:        BLOOD CULTURE RECEIVED NO GROWTH TO DATE CULTURE WILL BE HELD FOR 5 DAYS BEFORE ISSUING A FINAL NEGATIVE REPORT   Report Status PENDING   Incomplete   MRSA PCR SCREENING     Status: Normal   Collection Time   01/02/12  11:08 PM      Component Value Range Status Comment   MRSA by PCR NEGATIVE  NEGATIVE Final   BODY FLUID CULTURE     Status: Normal   Collection Time   01/03/12 11:18 AM      Component Value Range Status Comment   Specimen Description FLUID PARACENTESIS   Final    Special Requests NONE   Final    Gram Stain     Final    Value: NO WBC SEEN     NO ORGANISMS SEEN   Culture NO GROWTH 3 DAYS   Final    Report Status 01/06/2012 FINAL   Final      Peggye Pitt, MD [facility] Pager: [phone]   On-Call/Text Page:      [email or web address]      password [email or web address]  If 7PM-7AM, please contact night-coverage [email or web address] Password [facility] 01/08/2012, 9:52 AM   LOS: 6 days

## 2012-01-08 NOTE — Evaluation (Signed)
Physical Therapy Evaluation Patient Details Name: Kathryn [name] MRN: [id number] DOB: [birth year] Today's Date: 01/08/2012 Time: 7829-5621 PT Time Calculation (min): 19 min  PT Assessment / Plan / Recommendation Clinical Impression  Patient is a 61 yo female admitted with MI, septic shock.  Patient was intubated x4 days.  Patient with general weakness and pain impacting mobility.  Patient will benefit from acute PT for mobility/gait training to maximize independence prior to discharge home.  Recommend HHPT at discharge.    PT Assessment  Patient needs continued PT services    Follow Up Recommendations  Home health PT;Supervision/Assistance - 24 hour    Barriers to Discharge        Equipment Recommendations  None recommended by PT    Recommendations for Other Services     Frequency Min 3X/week    Precautions / Restrictions Precautions Precautions: Fall Restrictions Weight Bearing Restrictions: No   Pertinent Vitals/Pain Pain limiting mobility today.      Mobility  Bed Mobility Bed Mobility: Rolling Left;Left Sidelying to Sit;Sit to Supine Rolling Left: 4: Min assist;With rail Left Sidelying to Sit: 4: Min assist;With rails;HOB flat Sit to Supine: 4: Min assist;With rail Details for Bed Mobility Assistance: Verbal cues for technique.  Assist to lift trunk off bed.  Assist to return LE's onto bed. Transfers Transfers: Not assessed    Exercises     PT Diagnosis: Difficulty walking;Generalized weakness;Acute pain  PT Problem List: Decreased strength;Decreased activity tolerance;Decreased balance;Decreased mobility;Decreased knowledge of use of DME;Cardiopulmonary status limiting activity;Pain PT Treatment Interventions: DME instruction;Gait training;Functional mobility training;Therapeutic exercise;Patient/family education   PT Goals Acute Rehab PT Goals PT Goal Formulation: With patient Time For Goal Achievement: 01/15/12 Potential to Achieve Goals: Good Pt will go  Supine/Side to Sit: with supervision;with HOB 0 degrees PT Goal: Supine/Side to Sit - Progress: Goal set today Pt will go Sit to Supine/Side: with supervision;with HOB 0 degrees PT Goal: Sit to Supine/Side - Progress: Goal set today Pt will go Sit to Stand: with min assist;with upper extremity assist PT Goal: Sit to Stand - Progress: Goal set today Pt will go Stand to Sit: with min assist;with upper extremity assist PT Goal: Stand to Sit - Progress: Goal set today Pt will Transfer Bed to Chair/Chair to Bed: with min assist PT Transfer Goal: Bed to Chair/Chair to Bed - Progress: Goal set today Pt will Ambulate: 51 - 150 feet;with min assist;with rolling walker PT Goal: Ambulate - Progress: Goal set today  Visit Information  Last PT Received On: 01/08/12 Assistance Needed: +2    Subjective Data  Subjective: "I'm just hurting too bad today" Patient Stated Goal: To walk   Prior Functioning  Home Living Lives With: Spouse Available Help at Discharge: Family;Available 24 hours/day Type of Home: House Home Access: Stairs to enter [employer] of Steps: 2 Entrance Stairs-Rails: Left Home Layout: One level Bathroom Shower/Tub: Engineer, manufacturing systems: Standard Home Adaptive Equipment: Bedside commode/3-in-1;Shower chair with back;Wheelchair - manual;Walker - rolling Prior Function Level of Independence: Needs assistance Needs Assistance: Bathing;Light Housekeeping Bath: Minimal Light Housekeeping: Moderate Able to Take Stairs?: No Driving: No Vocation: On disability Communication Communication: No difficulties    Cognition  Overall Cognitive Status: Appears within functional limits for tasks assessed/performed Arousal/Alertness: Awake/alert Orientation Level: Appears intact for tasks assessed Behavior During Session: [facility] for tasks performed    Extremity/Trunk Assessment Right Upper Extremity Assessment RUE ROM/Strength/Tone: Deficits RUE ROM/Strength/Tone  Deficits: Strength 4-/5; Decreased elbow extension (h/o elbow surgery) RUE Sensation: [facility] -  Light Touch Left Upper Extremity Assessment LUE ROM/Strength/Tone: Deficits LUE ROM/Strength/Tone Deficits: Strength 4-/5 LUE Sensation: WFL - Light Touch Right Lower Extremity Assessment RLE ROM/Strength/Tone: Deficits RLE ROM/Strength/Tone Deficits: Decreased strength 4-/5 RLE Sensation: WFL - Light Touch Left Lower Extremity Assessment LLE ROM/Strength/Tone: Deficits LLE ROM/Strength/Tone Deficits: Decresaed strength 4-/5 LLE Sensation: History of peripheral neuropathy (Wound on bottom of foot)   Balance Balance Balance Assessed: Yes Static Sitting Balance Static Sitting - Balance Support: Bilateral upper extremity supported;Feet unsupported Static Sitting - Level of Assistance: 4: Min assist Static Sitting - Comment/# of Minutes: Patient with difficulty reaching upright sitting posture due to pain and weakness.  Patient tolerated sitting about 3 minutes.  Worked on static balance and midline posture.  Returned to supine due to back pain.  End of Session PT - End of Session Activity Tolerance: Patient limited by fatigue;Patient limited by pain Patient left: in bed;with call bell/phone within reach Nurse Communication: Mobility status  GP     Kathryn [name] 01/08/2012, 4:50 PM Durenda Hurt. Renaldo Fiddler, [facility] Acute Rehab Services Pager [phone]

## 2012-01-09 LAB — CBC
HCT: 37.7 % (ref 36.0–46.0)
Hemoglobin: 12.8 g/dL (ref 12.0–15.0)
MCH: 33.2 pg (ref 26.0–34.0)
MCHC: 34 g/dL (ref 30.0–36.0)
RBC: 3.85 MIL/uL — ABNORMAL LOW (ref 3.87–5.11)

## 2012-01-09 LAB — BASIC METABOLIC PANEL
BUN: 30 mg/dL — ABNORMAL HIGH (ref 6–23)
Chloride: 104 mEq/L (ref 96–112)
GFR calc Af Amer: 56 mL/min — ABNORMAL LOW (ref >90)
Glucose, Bld: 109 mg/dL — ABNORMAL HIGH (ref 70–99)
Potassium: 3.5 mEq/L (ref 3.5–5.1)

## 2012-01-09 LAB — GLUCOSE, CAPILLARY
Glucose-Capillary: 86 mg/dL (ref 70–99)
Glucose-Capillary: 131 mg/dL — ABNORMAL HIGH (ref 70–99)
Glucose-Capillary: 128 mg/dL — ABNORMAL HIGH (ref 70–99)

## 2012-01-09 MED ORDER — FUROSEMIDE 10 MG/ML IJ SOLN
INTRAMUSCULAR | Status: AC
  Filled 2012-01-09: qty 8

## 2012-01-09 MED ORDER — FUROSEMIDE 10 MG/ML IJ SOLN: 40.0000 mg | Freq: Two times a day (BID) | INTRAMUSCULAR | Status: DC

## 2012-01-09 MED ORDER — FUROSEMIDE 40 MG PO TABS
40.0000 mg | ORAL_TABLET | Freq: Two times a day (BID) | ORAL | Status: DC
  Administered 2012-01-10 – 2012-01-12 (×5): 40 mg via ORAL
  Filled 2012-01-09 (×8): qty 1

## 2012-01-09 MED ORDER — SPIRONOLACTONE 50 MG PO TABS
50.0000 mg | ORAL_TABLET | Freq: Every day | ORAL | Status: DC
  Administered 2012-01-09 – 2012-01-12 (×4): 50 mg via ORAL
  Filled 2012-01-09 (×4): qty 1

## 2012-01-09 MED ORDER — ALBUMIN HUMAN 25 % IV SOLN
50.0000 g | Freq: Once | INTRAVENOUS | Status: AC
  Administered 2012-01-09: 50 g via INTRAVENOUS
  Filled 2012-01-09: qty 200

## 2012-01-09 MED ORDER — FUROSEMIDE 10 MG/ML IJ SOLN
40.0000 mg | Freq: Two times a day (BID) | INTRAMUSCULAR | Status: AC
  Administered 2012-01-09 (×2): 40 mg via INTRAVENOUS

## 2012-01-09 NOTE — Progress Notes (Signed)
Subjective: Leaking from prior paracentesis sites. No blood in stool.  Objective: Vital signs in last 24 hours: Temp:  [97.7 F (36.5 C)-98 F (36.7 C)] 97.9 F (36.6 C) (09/23 0457) Pulse Rate:  [79-101] 101  (09/23 0821) Resp:  [19-20] 20  (09/23 0457) BP: (77-100)/(61-76) 77/61 mmHg (09/23 0821) SpO2:  [100 %] 100 % (09/23 0457) Weight:  [51.4 kg (113 lb 5.1 oz)] 51.4 kg (113 lb 5.1 oz) (09/23 0457) Weight change: -0.219 kg (-7.7 oz) Last BM Date: 01/08/12  PE: GEN:  Cachectic (progressive), alert, NAD MSK:  Diffuse muscular atrophy and wasting. SKIN:  Scattered ecchymoses. ABD:  Ascites, pad over leaking paracentesis site.  Lab Results: CBC    Component Value Date/Time   WBC 13.3* 01/09/2012 0630   RBC 3.85* 01/09/2012 0630   HGB 12.8 01/09/2012 0630   HCT 37.7 01/09/2012 0630   PLT 103* 01/09/2012 0630   MCV 97.9 01/09/2012 0630   MCH 33.2 01/09/2012 0630   MCHC 34.0 01/09/2012 0630   RDW 16.5* 01/09/2012 0630   LYMPHSABS 0.8 01/07/2012 0525   MONOABS 0.7 01/07/2012 0525   EOSABS 0.0 01/07/2012 0525   BASOSABS 0.0 01/07/2012 0525   CMP     Component Value Date/Time   NA 136 01/09/2012 0630   K 3.5 01/09/2012 0630   CL 104 01/09/2012 0630   CO2 26 01/09/2012 0630   GLUCOSE 109* 01/09/2012 0630   GLUCOSE 97 11/30/2009   BUN 30* 01/09/2012 0630   CREATININE 1.21* 01/09/2012 0630   CALCIUM 8.1* 01/09/2012 0630   CALCIUM 8.4 07/26/2011 1329   PROT 4.5* 01/08/2012 0421   ALBUMIN 2.1* 01/08/2012 0421   AST 28 01/08/2012 0421   ALT 11 01/08/2012 0421   ALKPHOS 135* 01/08/2012 0421   BILITOT 0.6 01/08/2012 0421   GFRNONAA 48* 01/09/2012 0630   GFRAA 56* 01/09/2012 0630   Assessment:  1.  Sepsis syndrome, resolved.  Unclear etiology, considerations include post-paracentesis, pneumonia, other. 2.  Non STEMI.  Suspect due to hypovolemia/shock.  Followed by cardiology. 3.  Hematemesis.  Resolved.  No varices on EGD in June.  Suspect due to esophagitis, which was seen on EGD in June. 4.   Weakness, debility.  Plan:  1.  Continue Protonix. 2.  Increase spironolactone to 50 mg once-a-day. 3.  Continue furosemide 40 mg twice-a-day. 4.  Advance diet to low sodium. 5.  Fluid restriction 1500 cc daily. 6.  In absence of recurrent bleeding, would not repeat endoscopy at this time. 7.  Patient needs disposition assistance from PT; suspect she will benefit from short-term inpatient rehab upon discharge. 8.  May benefit from paracentesis immediately prior to discharge, likely should receive antibiotics pre-paracentesis. 9.  Arrangements for outpatient liver transplant evaluation are in the works, but she is not transplant candidate at this time regardless due to recent alcohol consumption (< 6 months). 10. Will follow.   Kathryn [name] 01/09/2012, 8:53 AM

## 2012-01-09 NOTE — Progress Notes (Signed)
[facility] Progress Note   Kathryn [name] ZOX:[id number] DOB: [birth year] DOA: 01/02/2012 PCP: Willow Ora, MD  Brief narrative: 61 yo with alcoholic liver disease presenting to [facility] ED s/p fall, found to be profoundly hypotensive, with coffee ground emesis.  Events Since Admission:  9/16 >>> Presenting to [facility] ED s/p fall, found to be profoundly hypotensive, with coffee ground emesis.  9/17 >>> Intubated for ARF  9/18 >>> Improved neuro and BP, started Oxepa feeds, off all pressors  9/19 >>> tolerating SBT well, paracentesis, extubated  9/20 >>> improved renal fxn on vasopressin  Assessment/Plan:  Acute Respiratory Failure with respiratory alkalosis, ARDS -Resolved - extubated and doing well -Continue IS  B pleural effusions -Continue lasix - follow serial CXRs - watch Is/Os -Continue incentive spirometry (IS) -CXR demonstrating improvement in vascular congestion  Septic vs/ hemorrhagic shock -continue tapering steroids to off. -BP continue to be soft; but stable (due to cirrhosis, her BP might be in the low 90's to 100)  NSTEMI EF preserved without clear wall motion abnormality on echo - conservative therapy - holding any ASA or heparin with recent GI bleed - Cardiology following. No BB given hypotension, no statin given increased LFTs.  Ventricular arrythmia -Improved after Mg/K replacement -continue following electrolytes and tele activity -no CP, syncope or any acute complaints  Prolonged QT  Cardiology following  Acute renal failure  -No hydro on renal US - renal fxn improving. Cr back to baseline today of 1.21  -good urine output  Alcoholic cirrhosis w/ intractable ascites -At some point will need to consider TIPS if repeated paracentesis needed but would defer that decision until back to baseline -  -Continue lasix and spironolactone -Per GI; paracentesis to be done prior to discharge (1-2 days) -Albumin to be given today, due to leak from previous  paracentesis -GI following   Acute blood loss anemia -Follow Hgb trend  -No transfusion needed.  UGIB -Recent EGD revealed esophagitis but not varices - Gi following.  -Continue protonix  Thrombocytopenia Due to EtOH liver disease - stable at present; no acute bleeding -Continue avoiding heparin products  hypokalemia -Due to diuretic and steroids- replaced. -will monitor and continue PRN repletion  Hyperglycemia Steroid induced - cont SSI until off steroids (today is last day for steroids)  Toxic metabolic encephalopathy -Resolved - due to above acute issues - -B12 and ammonia WNL.  Disposition Start PT and OT for dispo planning.  Code Status: Full Consultants: GI PCCM Cardiology  Antibiotics: Vancomycin 9/16 >>> 9/19  Zosyn 9/16 >>> 9/19  Ceftriaxone 9/19 >>>stop 22  DVT prophylaxis: SCDs in setting of acute bleed  HPI/Subjective: Pt is alert and conversant.  She is pleasant.  She denies sob, cp, n/v, or abdom pain.  Feels better overall and reports good rest/sleep last night.   Objective: Blood pressure 77/61, pulse 101, temperature 97.9 F (36.6 C), temperature source Oral, resp. rate 20, height 5' (1.524 m), weight 51.4 kg (113 lb 5.1 oz), SpO2 100.00%.  Intake/Output Summary (Last 24 hours) at 01/09/12 1002 Last data filed at 01/09/12 0900  Gross per 24 hour  Intake   1300 ml  Output   1501 ml  Net   -201 ml     Exam: General: No acute respiratory distress at rest Lungs: Clear to auscultation bilaterally without wheezes or crackles  Cardiovascular: Regular rate and rhythm without murmur gallop or rub  Abdomen: Nontender, distended, soft, bowel sounds positive, no rebound, no appreciable mass; positive fluid wave; dressings from paracentesis  saturated on ascitic fluid. Extremities: No significant cyanosis, clubbing, or edema bilateral lower extremities - multiple scattered echymoses; Left foot with clean dressing in place; no pain.  Data  Reviewed: Basic Metabolic Panel:  Lab 01/09/12 8295 01/08/12 0421 01/07/12 6213 01/06/12 0428 01/05/12 0500 01/03/12 0448 01/02/12 2333  NA 136 136 140 133* 134* -- --  K 3.5 3.8 2.8* 2.9* 3.9 -- --  CL 104 102 104 98 100 -- --  CO2 26 20 24 20 20  -- --  GLUCOSE 109* 215* 113* 148* 128* -- --  BUN 30* 35* 38* 40* 37* -- --  CREATININE 1.21* 1.62* 2.30* 2.65* 2.80* -- --  CALCIUM 8.1* 7.8* 7.9* 7.8* 8.1* -- --  MG -- 1.6 -- -- 1.7 2.3 1.1*  PHOS -- 2.3 -- -- -- 3.8 4.8*   Liver Function Tests:  Lab 01/08/12 0421 01/07/12 0525 01/04/12 0500 01/02/12 1805  AST 28 28 94* 37  ALT 11 10 16 9   ALKPHOS 135* 109 162* 248*  BILITOT 0.6 0.9 1.4* 1.4*  PROT 4.5* 4.5* 5.3* 5.9*  ALBUMIN 2.1* 2.2* 1.5* 1.7*   CBC:  Lab 01/09/12 0630 01/08/12 0421 01/07/12 0525 01/06/12 0428 01/05/12 0500 01/04/12 0500 01/02/12 1805  WBC 13.3* 13.3* 10.5 9.4 12.1* -- --  NEUTROABS -- -- 9.0* -- -- 15.6* 19.9*  HGB 12.8 11.2* 9.7* 9.9* 10.8* -- --  HCT 37.7 33.1* 28.2* 29.0* 31.7* -- --  MCV 97.9 97.6 96.2 96.7 96.1 -- --  PLT 103* 115* 97* 101* 120* -- --   Cardiac Enzymes:  Lab 01/03/12 2100 01/03/12 1515 01/03/12 1055  CKTOTAL 202* 341* --  CKMB 55.8* 84.5* --  CKMBINDEX -- -- --  TROPONINI 13.14* 18.98* >20.00*   CBG:  Lab 01/09/12 0628 01/08/12 2055 01/08/12 1559 01/08/12 1058 01/08/12 0638  GLUCAP 116* 86 191* 157* 142*    Recent Results (from the past 240 hour(s))  CULTURE, BLOOD (ROUTINE X 2)     Status: Normal   Collection Time   01/02/12  5:50 PM      Component Value Range Status Comment   Specimen Description BLOOD ARM RIGHT   Final    Special Requests BOTTLES DRAWN AEROBIC ONLY [facility]   Final    Culture  Setup Time 01/02/2012 22:59   Final    Culture NO GROWTH 5 DAYS   Final    Report Status 01/08/2012 FINAL   Final   URINE CULTURE     Status: Normal   Collection Time   01/02/12  5:54 PM      Component Value Range Status Comment   Specimen Description URINE, CLEAN CATCH   Final     Special Requests NONE   Final    Culture  Setup Time 01/02/2012 18:47   Final    Colony Count NO GROWTH   Final    Culture NO GROWTH   Final    Report Status 01/03/2012 FINAL   Final   CULTURE, BLOOD (ROUTINE X 2)     Status: Normal   Collection Time   01/02/12  6:00 PM      Component Value Range Status Comment   Specimen Description BLOOD HAND RIGHT   Final    Special Requests BOTTLES DRAWN AEROBIC ONLY 3CC   Final    Culture  Setup Time 01/02/2012 22:59   Final    Culture NO GROWTH 5 DAYS   Final    Report Status 01/08/2012 FINAL   Final   MRSA PCR  SCREENING     Status: Normal   Collection Time   01/02/12 11:08 PM      Component Value Range Status Comment   MRSA by PCR NEGATIVE  NEGATIVE Final   BODY FLUID CULTURE     Status: Normal   Collection Time   01/03/12 11:18 AM      Component Value Range Status Comment   Specimen Description FLUID PARACENTESIS   Final    Special Requests NONE   Final    Gram Stain     Final    Value: NO WBC SEEN     NO ORGANISMS SEEN   Culture NO GROWTH 3 DAYS   Final    Report Status 01/06/2012 FINAL   Final      Vassie Loll, MD [facility] Pager: [phone]   On-Call/Text Page:      [email or web address]      password [email or web address]  If 7PM-7AM, please contact night-coverage [email or web address] Password [email or web address] 01/09/2012, 10:02 AM   LOS: 7 days

## 2012-01-09 NOTE — Progress Notes (Signed)
Pt BP 77/61 with pulse of 101.  MD has been notified.  New orders given.  Will continue to monitor. Nino Glow RN

## 2012-01-09 NOTE — Progress Notes (Signed)
Clinical Social Worker reviewed chart and noted PT current recommendations for [facility]. CSW to sign off.  This CSW questions if consulting [facility] Social Worker would be beneficial for pt and spouse to connect to community services.     Angelia Mould, MSW, [place] [phone]

## 2012-01-09 NOTE — Progress Notes (Signed)
Nutrition Follow-up  Intervention:   1. Encouraged po intake as able 2. RD will continue to follow    Assessment:   S/p extubation on 9/19, transferred out of ICU on 9/21.  S/p paracentesis x2, 9/19. NOw on limited fluids. Reports she is not able to eat the large amounts of food sent at each meal, eat well though and good appetite.    Diet Order:  Low Sodium PO intake: 50-100% Supplements: none   Meds: Scheduled Meds:   . albumin human  50 g Intravenous Once  . folic acid  1 mg Oral Daily  . furosemide      . furosemide  40 mg Intravenous BID  . furosemide  40 mg Oral BID  . insulin aspart  0-15 Units Subcutaneous TID WC  . magnesium sulfate 1 - 4 g bolus IVPB  2 g Intravenous Once  . pantoprazole  40 mg Oral BID AC  . spironolactone  50 mg Oral Daily  . thiamine  100 mg Oral Daily  . DISCONTD: cefTRIAXone (ROCEPHIN)  IV  1 g Intravenous Q24H  . DISCONTD: furosemide  40 mg Intravenous BID  . DISCONTD: furosemide  60 mg Intravenous BID  . DISCONTD: predniSONE  10 mg Oral Q breakfast  . DISCONTD: spironolactone  25 mg Oral Daily   Continuous Infusions:   . sodium chloride 20 mL/hr at 01/07/12 1828   PRN Meds:.fentaNYL, oxyCODONE  Labs:  CMP     Component Value Date/Time   NA 136 01/09/2012 0630   K 3.5 01/09/2012 0630   CL 104 01/09/2012 0630   CO2 26 01/09/2012 0630   GLUCOSE 109* 01/09/2012 0630   GLUCOSE 97 11/30/2009   BUN 30* 01/09/2012 0630   CREATININE 1.21* 01/09/2012 0630   CALCIUM 8.1* 01/09/2012 0630   CALCIUM 8.4 07/26/2011 1329   PROT 4.5* 01/08/2012 0421   ALBUMIN 2.1* 01/08/2012 0421   AST 28 01/08/2012 0421   ALT 11 01/08/2012 0421   ALKPHOS 135* 01/08/2012 0421   BILITOT 0.6 01/08/2012 0421   GFRNONAA 48* 01/09/2012 0630   GFRAA 56* 01/09/2012 0630     Intake/Output Summary (Last 24 hours) at 01/09/12 1232 Last data filed at 01/09/12 0900  Gross per 24 hour  Intake   1180 ml  Output   1201 ml  Net    -21 ml    Weight Status:  113 lbs, trending down  with fluids off   Re-estimated needs:  1500-1750 kcal, 65-75 gm protein daily, fluids limited by MD per pt.   Nutrition Dx:  Inadequate oral intake now resolved with diet advance New Dx: Increased nutrient needs r/t ascites AEB estimated nutrition needs.   Goal:  Intake to meet >90% estimated nutrition needs, met  Monitor:  PO intake, weight, labs   Clarene Duke RD, LDN Pager [phone] After Hours pager [phone]

## 2012-01-09 NOTE — Progress Notes (Signed)
Physical Therapy Treatment Patient Details Name: JOHNANNA [name] MRN: [id number] DOB: 1951/04/13 Today's Date: 01/09/2012 Time: 6213-0865 PT Time Calculation (min): 18 min  PT Assessment / Plan / Recommendation Comments on Treatment Session  Pt progressing with PT goals & mobility.  Pt required min assist to ambulate ~15' with bil UE support.  Feel as though pt could have increased distance but limited due to significant drainage from Rt hip.  Will trial use of RW during ambulation next session.      Follow Up Recommendations  Home health PT;Supervision/Assistance - 24 hour    Barriers to Discharge        Equipment Recommendations  None recommended by PT    Recommendations for Other Services    Frequency Min 3X/week   Plan Discharge plan remains appropriate    Precautions / Restrictions Precautions Precautions: Fall Restrictions Weight Bearing Restrictions: No       Mobility  Bed Mobility Bed Mobility: Not assessed Transfers Transfers: Sit to Stand;Stand to Sit Sit to Stand: 4: Min assist;With upper extremity assist;With armrests;From chair/3-in-1 Stand to Sit: 4: Min assist;With upper extremity assist;With armrests;To chair/3-in-1 Details for Transfer Assistance: cues for hand placement & technique.  Performed 7x's for strengthening, activity tolerance, & increased independence.   Ambulation/Gait Ambulation/Gait Assistance: 4: Min assist Ambulation Distance (Feet): 15 Feet Assistive device: 2 person hand held assist Ambulation/Gait Assistance Details: Assist for balance & safety.   Gait Pattern: Decreased stride length;Lateral trunk lean to right;Step-through pattern;Decreased step length - right;Decreased step length - left;Shuffle Stairs: No Wheelchair Mobility Wheelchair Mobility: No    Exercises General Exercises - Lower Extremity Ankle Circles/Pumps: AROM;Both;10 reps Quad Sets: AROM;Both;10 reps Long Arc Quad: AROM;Both;10 reps Heel Slides: AAROM;Both;10  reps Hip Flexion/Marching: AROM;Both;10 reps;Seated Heel Raises: AROM;Both;10 reps    PT Goals Acute Rehab PT Goals Time For Goal Achievement: 01/15/12 Potential to Achieve Goals: Good PT Goal: Sit to Stand - Progress: Met PT Goal: Stand to Sit - Progress: Met PT Goal: Ambulate - Progress: Progressing toward goal  Visit Information  Last PT Received On: 01/09/12 Assistance Needed: +1    Subjective Data  Patient Stated Goal: To walk   Cognition  Overall Cognitive Status: Appears within functional limits for tasks assessed/performed Arousal/Alertness: Awake/alert Orientation Level: Appears intact for tasks assessed Behavior During Session: [facility] for tasks performed    Balance     End of Session PT - End of Session Activity Tolerance: Patient tolerated treatment well;Other (comment) (limited by drainage.  ) Patient left: in chair;with call bell/phone within reach Nurse Communication: Mobility status    Verdell Face, [place] [phone] 01/09/2012

## 2012-01-09 NOTE — Progress Notes (Signed)
Occupational Therapy Evaluation Patient Details Name: Kathryn [name] MRN: [id number] DOB: October 15, 1950 Today's Date: 01/09/2012 Time: 4098-1191 OT Time Calculation (min): 32 min  OT Assessment / Plan / Recommendation Clinical Impression  61 yo admitted due to fall. Encephalopathic, MI, septic shock, intubated x 4 days. Pt will benefit from skilled OT services due to below deficits to max independence with ADL and functional mobility for ADL to facilitate D/C home with 24/7 S of family. Pt will need 3 in 1 and tub bench for home, in addition to AE to increase independence with ADL and mobility. Pt is motivated to return to PLOF.     OT Assessment  Patient needs continued OT Services    Follow Up Recommendations  Home health OT    Barriers to Discharge None    Equipment Recommendations  3 in 1 bedside comode;Tub/shower bench    Recommendations for Other Services  none  Frequency  Min 2X/week    Precautions / Restrictions Precautions Precautions: Fall   Pertinent Vitals/Pain Min "hip pain"    ADL  Eating/Feeding: Performed;Independent Where Assessed - Eating/Feeding: Bed level Grooming: Simulated;Supervision/safety Where Assessed - Grooming: Supported standing Upper Body Bathing: Simulated;Set up Where Assessed - Upper Body Bathing: Supported sitting Lower Body Bathing: Simulated;Moderate assistance Where Assessed - Lower Body Bathing: Supported sit to stand Upper Body Dressing: Simulated;Set up Where Assessed - Upper Body Dressing: Supported sitting Lower Body Dressing: Simulated;Moderate assistance Where Assessed - Lower Body Dressing: Supported sit to stand Toilet Transfer: Counsellor Method: Sit to stand;Stand pivot Acupuncturist: Comfort height toilet Toileting - Clothing Manipulation and Hygiene: Simulated;Minimal assistance Where Assessed - Engineer, mining and Hygiene: Sit to stand from 3-in-1 or  toilet;Standing Tub/Shower Transfer: Simulated;Maximal assistance Equipment Used: Gait belt Transfers/Ambulation Related to ADLs: Min guard. RW ADL Comments: a for LB ADL    OT Diagnosis: Generalized weakness;Acute pain  OT Problem List: Decreased strength;Decreased activity tolerance;Decreased safety awareness;Decreased knowledge of use of DME or AE;Decreased knowledge of precautions;Cardiopulmonary status limiting activity;Pain;Increased edema OT Treatment Interventions: Self-care/ADL training;Therapeutic exercise;Energy conservation;DME and/or AE instruction;Therapeutic activities;Patient/family education   OT Goals Acute Rehab OT Goals OT Goal Formulation: With patient Time For Goal Achievement: 01/23/12 Potential to Achieve Goals: Good ADL Goals Pt Will Perform Lower Body Bathing: Sit to stand from chair;with adaptive equipment;with cueing (comment type and amount);Unsupported;with supervision ADL Goal: Lower Body Bathing - Progress: Goal set today Pt Will Perform Lower Body Dressing: with supervision ADL Goal: Lower Body Dressing - Progress: Goal set today Pt Will Transfer to Toilet: with supervision;3-in-1;with DME ADL Goal: Toilet Transfer - Progress: Goal set today Pt Will Perform Toileting - Clothing Manipulation: with supervision;Standing;Sitting on 3-in-1 or toilet;with cueing (comment type and amount) ADL Goal: Toileting - Clothing Manipulation - Progress: Goal set today Pt Will Perform Toileting - Hygiene: with modified independence;Standing at 3-in-1/toilet;Sit to stand from 3-in-1/toilet ADL Goal: Toileting - Hygiene - Progress: Goal set today Pt Will Perform Tub/Shower Transfer: with min assist;with caregiver independent in assisting;Ambulation;with DME;Transfer tub bench ADL Goal: Tub/Shower Transfer - Progress: Goal set today Arm Goals Pt Will Complete Theraband Exer: with supervision, verbal cues required/provided;to increase strength;Bilateral upper extremities;1  set;Level 1 Theraband Arm Goal: Theraband Exercises - Progress: Goal set today  Visit Information  Last OT Received On: 01/09/12 Assistance Needed: +1    Subjective Data      Prior Functioning  Vision/Perception  Home Living Lives With: Spouse Available Help at Discharge: Family;Available 24 hours/day Type of Home: House  Home Access: Stairs to enter Entrance Stairs-Number of Steps: 2 Entrance Stairs-Rails: Left Home Layout: One level Bathroom Shower/Tub: Tub/shower unit;Curtain Bathroom Toilet: Standard Bathroom Accessibility: Yes How Accessible: Accessible via walker Home Adaptive Equipment: Bedside commode/3-in-1;Shower chair with back;Wheelchair - manual;Walker - rolling Prior Function Level of Independence: Needs assistance Needs Assistance: Bathing;Light Housekeeping Bath: Minimal Light Housekeeping: Moderate Able to Take Stairs?: No Driving: No Vocation: On disability Comments: help as needed Communication Communication: No difficulties Dominant Hand: Right      Cognition  Overall Cognitive Status: Appears within functional limits for tasks assessed/performed Arousal/Alertness: Awake/alert Orientation Level: Appears intact for tasks assessed Behavior During Session: [facility] for tasks performed    Extremity/Trunk Assessment Right Upper Extremity Assessment RUE ROM/Strength/Tone: Deficits RUE ROM/Strength/Tone Deficits: Strength 4-/5; Decreased elbow extension (h/o elbow surgery) RUE Sensation: WFL - Light Touch RUE Coordination: WFL - gross/fine motor Left Upper Extremity Assessment LUE ROM/Strength/Tone: Deficits LUE ROM/Strength/Tone Deficits: Strength 4-/5 LUE Sensation: WFL - Light Touch LUE Coordination: WFL - gross/fine motor Right Lower Extremity Assessment RLE ROM/Strength/Tone: Deficits RLE ROM/Strength/Tone Deficits: Decreased strength 4-/5 RLE Sensation: WFL - Light Touch Left Lower Extremity Assessment LLE ROM/Strength/Tone: Deficits LLE  ROM/Strength/Tone Deficits: Decresaed strength 4-/5 LLE Sensation: History of peripheral neuropathy   Mobility  Shoulder Instructions  Bed Mobility Bed Mobility: Sit to Supine;Scooting to [facility] Rolling Left: 5: Supervision;With rail Details for Bed Mobility Assistance: slow movement Transfers Transfers: Sit to Stand;Stand to Sit Sit to Stand: 4: Min assist;With upper extremity assist;With armrests;From chair/3-in-1 Stand to Sit: 4: Min assist;With upper extremity assist;With armrests;To chair/3-in-1       Exercise     Balance  min A with dynamic tasks   End of Session OT - End of Session Equipment Utilized During Treatment: Gait belt Activity Tolerance: Patient tolerated treatment well Patient left: in bed;with call bell/phone within reach Nurse Communication: Mobility status  GO     Brytney Somes,HILLARY 01/09/2012, 3:48 PM [facility], OTR/L  [phone] 01/09/2012

## 2012-01-10 DIAGNOSIS — K219 Gastro-esophageal reflux disease without esophagitis: Secondary | ICD-10-CM

## 2012-01-10 LAB — BASIC METABOLIC PANEL
GFR calc non Af Amer: 63 mL/min — ABNORMAL LOW (ref >90)
Glucose, Bld: 117 mg/dL — ABNORMAL HIGH (ref 70–99)
Potassium: 2.9 mEq/L — ABNORMAL LOW (ref 3.5–5.1)
Sodium: 138 mEq/L (ref 135–145)

## 2012-01-10 LAB — GLUCOSE, CAPILLARY
Glucose-Capillary: 132 mg/dL — ABNORMAL HIGH (ref 70–99)
Glucose-Capillary: 130 mg/dL — ABNORMAL HIGH (ref 70–99)

## 2012-01-10 MED ORDER — POTASSIUM CHLORIDE CRYS ER 20 MEQ PO TBCR
40.0000 meq | EXTENDED_RELEASE_TABLET | ORAL | Status: AC
  Administered 2012-01-10 (×3): 40 meq via ORAL
  Filled 2012-01-10 (×3): qty 2

## 2012-01-10 MED ORDER — SODIUM CHLORIDE 0.9 % IJ SOLN
10.0000 mL | INTRAMUSCULAR | Status: DC | PRN
  Administered 2012-01-10 – 2012-01-12 (×5): 10 mL

## 2012-01-10 NOTE — Progress Notes (Signed)
Subjective: No bleeding. Tolerating diet. Less abdominal distention, less abdominal wall leakage.  Objective: Vital signs in last 24 hours: Temp:  [97.9 F (36.6 C)-98.2 F (36.8 C)] 98 F (36.7 C) (09/24 0559) Pulse Rate:  [84-95] 87  (09/24 0559) Resp:  [14-16] 16  (09/24 0559) BP: (80-94)/(58-70) 82/61 mmHg (09/24 0559) SpO2:  [99 %-100 %] 99 % (09/24 0559) Weight:  [48.9 kg (107 lb 12.9 oz)] 48.9 kg (107 lb 12.9 oz) (09/24 0559) Weight change: -2.5 kg (-5 lb 8.2 oz) Last BM Date: 01/09/12  PE: GEN:  Chronically cachectic, pleasant, NAD EXT:  No edema SKIN:  Scattered ecchymoses ABD:  Less ascites, some persistent (but less) leakage from prior paracentesis site.  Lab Results: CBC    Component Value Date/Time   WBC 13.3* 01/09/2012 0630   RBC 3.85* 01/09/2012 0630   HGB 12.8 01/09/2012 0630   HCT 37.7 01/09/2012 0630   PLT 103* 01/09/2012 0630   MCV 97.9 01/09/2012 0630   MCH 33.2 01/09/2012 0630   MCHC 34.0 01/09/2012 0630   RDW 16.5* 01/09/2012 0630   LYMPHSABS 0.8 01/07/2012 0525   MONOABS 0.7 01/07/2012 0525   EOSABS 0.0 01/07/2012 0525   BASOSABS 0.0 01/07/2012 0525   CMP     Component Value Date/Time   NA 138 01/10/2012 0519   K 2.9* 01/10/2012 0519   CL 101 01/10/2012 0519   CO2 26 01/10/2012 0519   GLUCOSE 117* 01/10/2012 0519   GLUCOSE 97 11/30/2009   BUN 24* 01/10/2012 0519   CREATININE 0.96 01/10/2012 0519   CALCIUM 8.3* 01/10/2012 0519   CALCIUM 8.4 07/26/2011 1329   PROT 4.5* 01/08/2012 0421   ALBUMIN 2.1* 01/08/2012 0421   AST 28 01/08/2012 0421   ALT 11 01/08/2012 0421   ALKPHOS 135* 01/08/2012 0421   BILITOT 0.6 01/08/2012 0421   GFRNONAA 63* 01/10/2012 0519   GFRAA 74* 01/10/2012 0519   Assessment:  1.  Alcohol-mediated cirrhosis. 2.  Ascites, improving.  Renal function, previously prohibitive to escalate diuretics, is now in acceptable range for diuretic adjustment. 3.  Hypokalemia, post-furosemide. 4.  GI bleeding, suspect esophagitis, resolved. 5.   Generalized weakness and debility.  Plan:  1.  Low sodium diet. 2.  Continue diuretics.  Continue Protonix. 3.  Replete potassium. 4.  May be able to hold off on paracentesis for now, as her ascites is improving since we've been able to adjust her diuretic regimen. 5.  PT/OT to determine disposition.   6.  Will follow.   Freddy Jaksch 01/10/2012, 8:40 AM

## 2012-01-10 NOTE — Progress Notes (Signed)
Physical Therapy Treatment Patient Details Name: Kathryn [name] MRN: [id number] DOB: [birth year] Today's Date: 01/10/2012 Time: 1451-1510 PT Time Calculation (min): 19 min  PT Assessment / Plan / Recommendation Comments on Treatment Session  Patient s/p sepsis, alcoholic cirrhosis and MI.  Patient with decr mobility secondary to decr endurance, decr safety awareness with RW and decr balance.  Will benefit from continued PT to address mobility issues.      Follow Up Recommendations  Home health PT;Supervision/Assistance - 24 hour    Barriers to Discharge        Equipment Recommendations  3 in 1 bedside comode;Tub/shower bench    Recommendations for Other Services    Frequency Min 3X/week   Plan Discharge plan remains appropriate;Frequency remains appropriate    Precautions / Restrictions Precautions Precautions: Fall Restrictions Weight Bearing Restrictions: No   Pertinent Vitals/Pain VSS, No pain    Mobility  Bed Mobility Bed Mobility: Rolling Left;Left Sidelying to Sit;Sitting - Scoot to Edge of Bed Rolling Left: 5: Supervision;With rail Left Sidelying to Sit: 5: Supervision;With rails;HOB flat Sitting - Scoot to Edge of Bed: 5: Supervision;With rail Sit to Supine: Not Tested (comment) Transfers Transfers: Sit to Stand;Stand to Sit Sit to Stand: 4: Min guard;With upper extremity assist;From bed Stand to Sit: 4: Min guard;With upper extremity assist;With armrests;To chair/3-in-1 Details for Transfer Assistance: cues for hand placement and technique.   Ambulation/Gait Ambulation/Gait Assistance: 4: Min guard Ambulation Distance (Feet): 30 Feet Assistive device: Rolling walker Ambulation/Gait Assistance Details: Patient needed cues to stay close to RW and steer RW.   Gait Pattern: Decreased stride length;Lateral trunk lean to right;Step-through pattern;Decreased step length - right;Shuffle Gait velocity: decreased Stairs: No Wheelchair Mobility Wheelchair Mobility: No    PT Goals Acute Rehab PT Goals PT Goal: Supine/Side to Sit - Progress: Progressing toward goal PT Goal: Sit to Stand - Progress: Progressing toward goal PT Goal: Stand to Sit - Progress: Progressing toward goal PT Transfer Goal: Bed to Chair/Chair to Bed - Progress: Progressing toward goal PT Goal: Ambulate - Progress: Progressing toward goal  Visit Information  Last PT Received On: 01/10/12 Assistance Needed: +1    Subjective Data  Subjective: "I would like to walk to the door."   Cognition  Overall Cognitive Status: Appears within functional limits for tasks assessed/performed Arousal/Alertness: Awake/alert Orientation Level: Appears intact for tasks assessed Behavior During Session: [facility] for tasks performed    Balance  Static Sitting Balance Static Sitting - Balance Support: No upper extremity supported;Feet supported Static Sitting - Level of Assistance: 6: Modified independent (Device/Increase time) Static Sitting - Comment/# of Minutes: 2 minutes at EOB  End of Session PT - End of Session Equipment Utilized During Treatment: Gait belt Activity Tolerance: Patient tolerated treatment well Patient left: in chair;with call bell/phone within reach Nurse Communication: Mobility status       INGOLD,Jessicca Stitzer 01/10/2012, 4:28 PM  [facility] [phone] (pager)

## 2012-01-10 NOTE — Progress Notes (Signed)
01/10/12, Kathi Der RNC-MNN, BSN, [phone], CM received referral and met with pt.  Pt offered choice for [facility] services.  Pt states she has used [facility] before and wants to use them again.  Marie at [facility] contacted with order and confirmation of services received.  Pt states her spouse will be able to assist her at home as needed when discharged.

## 2012-01-10 NOTE — Progress Notes (Signed)
[facility] Progress Note   Kathryn [name] AVW:[id number] DOB: [birth year] DOA: 01/02/2012 PCP: Kathryn Ora, MD  Brief narrative: 61 yo with alcoholic liver disease presenting to [facility] ED s/p fall, found to be profoundly hypotensive, with coffee ground emesis.  Events Since Admission:  9/16 >>> Presenting to [facility] ED s/p fall, found to be profoundly hypotensive, with coffee ground emesis.  9/17 >>> Intubated for ARF  9/18 >>> Improved neuro and BP, started Oxepa feeds, off all pressors  9/19 >>> tolerating SBT well, paracentesis, extubated  9/20 >>> improved renal fxn on vasopressin  Assessment/Plan:  Acute Respiratory Failure with respiratory alkalosis, ARDS -Resolved - extubated and doing well -resolved -Continue IS  B pleural effusions -Continue lasix ; watch Is/Os -Continue incentive spirometry (IS) -CXR demonstrating improvement in vascular congestion -Will use new lasix dose as an outpatient  Septic vs/ hemorrhagic shock -steroids to off. BP stable -BP continue to be soft; but stable (due to cirrhosis, her BP might be in the low 90's to 100)  NSTEMI EF preserved without clear wall motion abnormality on echo - conservative therapy - holding any ASA or heparin with recent GI bleed - Cardiology following. No BB given hypotension, no statin given increased LFTs.  Ventricular arrythmia -Improved after Mg/K replacement -continue following electrolytes and tele activity -no CP, syncope or any acute complaints  Prolonged QT  Cardiology following -close follow up of electrolytes  Acute renal failure  -No hydro on renal US - renal fxn improving. Cr bat its best now; today of 0.96  -good urine output  Alcoholic cirrhosis w/ intractable ascites -At some point will need to consider TIPS if repeated paracentesis needed but would defer that decision until back to baseline -  -Continue lasix and spironolactone -Per GI; paracentesis might no be needed now; due to right diuretic dose  fluid status improving -Albumin given yesterday; less leak from previous paracentesis -GI following   Acute blood loss anemia -Follow Hgb trend  -No transfusion needed.  UGIB -Recent EGD revealed esophagitis but not varices - Gi following.  -Continue protonix -no further GI bleed  Thrombocytopenia Due to EtOH liver disease - stable at present; no acute bleeding -Continue avoiding heparin products  hypokalemia -Due to diuretic and steroids. -K 2.9 today. Will replete and check Mg.  Hyperglycemia -Steroid induced more likely -CBG's improved now that she is off steroids.   Toxic metabolic encephalopathy -Resolved - due to above acute issues - -B12 and ammonia WNL.  Disposition Home with HH at discharge; patient reaching medically stability in 1-2 days to go home.  Code Status: Full Consultants: GI PCCM Cardiology  Antibiotics: Vancomycin 9/16 >>> 9/19  Zosyn 9/16 >>> 9/19  Ceftriaxone 9/19 >>>stop 22  DVT prophylaxis: SCDs in setting of acute bleed  HPI/Subjective: Pt is alert and conversant.  She is pleasant.  She denies sob, cp, n/v, or abdom pain.  Feels better overall and reports good rest/sleep last night. Tolerating full diet; no further episodes of GI bleed   Objective: Blood pressure 95/78, pulse 97, temperature 98 F (36.7 C), temperature source Oral, resp. rate 16, height 5' (1.524 m), weight 48.9 kg (107 lb 12.9 oz), SpO2 99.00%.  Intake/Output Summary (Last 24 hours) at 01/10/12 1050 Last data filed at 01/10/12 0840  Gross per 24 hour  Intake    240 ml  Output    952 ml  Net   -712 ml     Exam: General: No acute respiratory distress at rest Lungs: Clear to  auscultation bilaterally without wheezes or crackles  Cardiovascular: Regular rate and rhythm without murmur gallop or rub  Abdomen: Nontender, less distended, soft, bowel sounds positive, no rebound, no appreciable mass; positive fluid wave; dressings from paracentesis with some ascitic  fluid; but a lot less leakage in comparison from yesterday . Extremities: No significant cyanosis, clubbing, or edema bilateral lower extremities - multiple scattered echymoses; Left foot with clean dressing in place; no pain.  Data Reviewed: Basic Metabolic Panel:  Lab 01/10/12 1610 01/09/12 0630 01/08/12 0421 01/07/12 0525 01/06/12 0428 01/05/12 0500  NA 138 136 136 140 133* --  K 2.9* 3.5 3.8 2.8* 2.9* --  CL 101 104 102 104 98 --  CO2 26 26 20 24 20  --  GLUCOSE 117* 109* 215* 113* 148* --  BUN 24* 30* 35* 38* 40* --  CREATININE 0.96 1.21* 1.62* 2.30* 2.65* --  CALCIUM 8.3* 8.1* 7.8* 7.9* 7.8* --  MG -- -- 1.6 -- -- 1.7  PHOS -- -- 2.3 -- -- --   Liver Function Tests:  Lab 01/08/12 0421 01/07/12 0525 01/04/12 0500  AST 28 28 94*  ALT 11 10 16   ALKPHOS 135* 109 162*  BILITOT 0.6 0.9 1.4*  PROT 4.5* 4.5* 5.3*  ALBUMIN 2.1* 2.2* 1.5*   CBC:  Lab 01/09/12 0630 01/08/12 0421 01/07/12 0525 01/06/12 0428 01/05/12 0500 01/04/12 0500  WBC 13.3* 13.3* 10.5 9.4 12.1* --  NEUTROABS -- -- 9.0* -- -- 15.6*  HGB 12.8 11.2* 9.7* 9.9* 10.8* --  HCT 37.7 33.1* 28.2* 29.0* 31.7* --  MCV 97.9 97.6 96.2 96.7 96.1 --  PLT 103* 115* 97* 101* 120* --   Cardiac Enzymes:  Lab 01/03/12 2100 01/03/12 1515 01/03/12 1055  CKTOTAL 202* 341* --  CKMB 55.8* 84.5* --  CKMBINDEX -- -- --  TROPONINI 13.14* 18.98* >20.00*   CBG:  Lab 01/10/12 0640 01/09/12 1746 01/09/12 1144 01/09/12 0628 01/08/12 2055  GLUCAP 132* 128* 131* 116* 86    Recent Results (from the past 240 hour(s))  CULTURE, BLOOD (ROUTINE X 2)     Status: Normal   Collection Time   01/02/12  5:50 PM      Component Value Range Status Comment   Specimen Description BLOOD ARM RIGHT   Final    Special Requests BOTTLES DRAWN AEROBIC ONLY [facility]   Final    Culture  Setup Time 01/02/2012 22:59   Final    Culture NO GROWTH 5 DAYS   Final    Report Status 01/08/2012 FINAL   Final   URINE CULTURE     Status: Normal   Collection Time    01/02/12  5:54 PM      Component Value Range Status Comment   Specimen Description URINE, CLEAN CATCH   Final    Special Requests NONE   Final    Culture  Setup Time 01/02/2012 18:47   Final    Colony Count NO GROWTH   Final    Culture NO GROWTH   Final    Report Status 01/03/2012 FINAL   Final   CULTURE, BLOOD (ROUTINE X 2)     Status: Normal   Collection Time   01/02/12  6:00 PM      Component Value Range Status Comment   Specimen Description BLOOD HAND RIGHT   Final    Special Requests BOTTLES DRAWN AEROBIC ONLY [facility]   Final    Culture  Setup Time 01/02/2012 22:59   Final    Culture NO GROWTH  5 DAYS   Final    Report Status 01/08/2012 FINAL   Final   MRSA PCR SCREENING     Status: Normal   Collection Time   01/02/12 11:08 PM      Component Value Range Status Comment   MRSA by PCR NEGATIVE  NEGATIVE Final   BODY FLUID CULTURE     Status: Normal   Collection Time   01/03/12 11:18 AM      Component Value Range Status Comment   Specimen Description FLUID PARACENTESIS   Final    Special Requests NONE   Final    Gram Stain     Final    Value: NO WBC SEEN     NO ORGANISMS SEEN   Culture NO GROWTH 3 DAYS   Final    Report Status 01/06/2012 FINAL   Final      Vassie Loll, MD [facility] Pager: [phone]   On-Call/Text Page:      [email or web address]      password [email or web address]  If 7PM-7AM, please contact night-coverage [email or web address] Password [email or web address] 01/10/2012, 10:50 AM   LOS: 8 days

## 2012-01-10 NOTE — Progress Notes (Signed)
Patient Name: Kathryn [name] Date of Encounter: 01/10/2012     Principal Problem:  *Septic shock Active Problems:  HYPERLIPIDEMIA  ESSENTIAL HYPERTENSION  EtOH dependence  Hypomagnesemia  Alcoholic liver disease  Hypokalemia  Lactic acidosis  Fall  Acute renal failure  Coffee ground emesis  Hemorrhagic shock  Acute respiratory failure  Myocardial infarction acute    SUBJECTIVE: Feels well. In good spirits. Reports persistent drainage from the paracentesis site, but this has improved. Denies chest pain, shortness of breath, n/v, diaphoresis, palpitations or lightheadedness. No PND or orthopnea.    OBJECTIVE  Filed Vitals:   01/09/12 1754 01/09/12 2214 01/10/12 0559 01/10/12 1018  BP: 94/59 80/58 82/61  95/78  Pulse: 87 84 87 97  Temp:  98.2 F (36.8 C) 98 F (36.7 C)   TempSrc:  Oral Oral   Resp:  16 16   Height:      Weight:   48.9 kg (107 lb 12.9 oz)   SpO2:  100% 99%     Intake/Output Summary (Last 24 hours) at 01/10/12 1210 Last data filed at 01/10/12 0840  Gross per 24 hour  Intake    240 ml  Output    952 ml  Net   -712 ml   Weight change: -2.5 kg (-5 lb 8.2 oz)  PHYSICAL EXAM  General: Cachectic, in no acute distress. Head: Normocephalic, atraumatic, sclera non-icteric, no xanthomas, nares are without discharge.  Neck: Supple without bruits or JVD. Lungs:  Resp regular and unlabored, CTAB without wheezes, rales or rhonchi Heart: RRR no s3, s4, or murmurs. Abdomen: Distended, hypoactive BS, visible yellow drainage to bandage at RUQ, non-tender  Msk:  Strength and tone appears normal for age. Extremities: No clubbing, cyanosis or edema. DP/PT/Radials 2+ and equal bilaterally. Neuro: Alert and oriented X 3. Moves all extremities spontaneously. Psych: Normal affect.  LABS:  Recent Labs  [facility] 01/09/12 0630 01/08/12 0421   WBC 13.3* 13.3*   HGB 12.8 11.2*   HCT 37.7 33.1*   MCV 97.9 97.6   PLT 103* 115*    Basename 01/08/12 0421    VITAMINB12 473  FOLATE --  FERRITIN --  TIBC --  IRON --  RETICCTPCT --    Lab 01/10/12 0519 01/09/12 0630 01/08/12 0421  NA 138 136 136  K 2.9* 3.5 3.8  CL 101 104 102  CO2 26 26 20   BUN 24* 30* 35*  CREATININE 0.96 1.21* 1.62*  CALCIUM 8.3* 8.1* 7.8*  PROT -- -- 4.5*  BILITOT -- -- 0.6  ALKPHOS -- -- 135*  ALT -- -- 11  AST -- -- 28  AMYLASE -- -- --  LIPASE -- -- --  GLUCOSE 117* 109* 215*   TELE: NSR, 60-80 bpm, occasional episodes of NSVT (5 beat runs)  Radiology/Studies:   US Renal Port  01/04/2012  *RADIOLOGY REPORT*  Clinical Data: Acute renal failure, history hypertension, fall of liver disease  RENAL/URINARY TRACT ULTRASOUND COMPLETE PORTABLE  Comparison:  Abdomen ultrasound 07/27/2011  Findings:  Right Kidney:  10.2 cm length.  Mild cortical thinning.  Increased cortical echogenicity.  No mass, hydronephrosis shadowing calcification.  Left Kidney:  1.7 cm length.  Cortical thinning.  Increased cortical echogenicity.  No mass, hydronephrosis shadowing calcification.  Bladder:  Decompressed by Foley catheter, inadequately visualized.  Incidentally noted bilateral pleural effusions and significant ascites.  IMPRESSION: Medical renal disease changes of both kidneys. No evidence of renal mass or obstructive uropathy. Bilateral pleural effusions and significant ascites.   Original  Report Authenticated By: Lollie Marrow, M.D.    Dg Chest Port 1 View  01/08/2012  *RADIOLOGY REPORT*  Clinical Data: Evaluate pulmonary edema, shortness of breath, ascites  PORTABLE CHEST - 1 VIEW  Comparison: 01/07/2012; 01/06/2012; 01/05/2012  Findings: Grossly unchanged enlarged cardiac silhouette and mediastinal contours given patient rotation to the left. Stable position of support apparatus.  Likely unchanged layering bilateral pleural effusions given the patient's more upright positioning. Bibasilar heterogeneous / consolidative opacities persist, left greater than right.  Increased  conspicuity the pulmonary vasculature.  No pneumothorax.  Unchanged bones including multiple old left sided posterior lateral rib fractures.  IMPRESSION:  Findings suggestive of improved pulmonary edema with persistent small to moderate sized layering bilateral effusions and bibasilar opacities, atelectasis versus infiltrate.   Original Report Authenticated By: Waynard Reeds, M.D.    Dg Chest Port 1 View  01/07/2012  *RADIOLOGY REPORT*  Clinical Data: Evaluate pleural effusions  PORTABLE CHEST - 1 VIEW  Comparison: 01/06/2012  Findings: There is a right IJ catheter with tip in the cavoatrial junction.  The heart size appears normal.  Bilateral pleural effusions and interstitial edema is noted.  This is unchanged from previous exam.  Atelectasis is identified in the right base.  There are chronic left posterior lateral rib fracture deformities.  IMPRESSION:  1.  CHF.  Unchanged from previous exam.   Original Report Authenticated By: Rosealee Albee, M.D.    Current Medications:     . albumin human  50 g Intravenous Once  . folic acid  1 mg Oral Daily  . furosemide      . furosemide  40 mg Intravenous BID  . furosemide  40 mg Oral BID  . insulin aspart  0-15 Units Subcutaneous TID WC  . pantoprazole  40 mg Oral BID AC  . potassium chloride  40 mEq Oral Q4H  . spironolactone  50 mg Oral Daily  . thiamine  100 mg Oral Daily    ASSESSMENT:  1. Acute respiratory failure 2. Septic shock 3. Alcoholic cirrhosis with ascites requiring paracentesis  4. NSTEMI 5. Chronic diastolic CHF 6. NSVT 7. QT prolongation 8. Hypokalemia 9. Thrombocytopenia 10. Hypotension  DISCUSSION/PLAN:  Patient is much improved. She denies chest pain or shortness of breath. 2D echo reveals preserved LVEF and no WMAs. Cardiac biomarkers have down-trended. ASA/heparin held in setting of recent hematemesis secondary to esophagitis. Labile BPs limit the initiation of BB therapy. Underlying cirrhosis limits statin use.  Cardiac management has been largely conservative. Will need to discuss utility of ischemic eval in the future with MD now that she has recovered. Renal function much improved. However, risk of bleeding still high at this point should she require antiplatelet therapy. She remains thrombocytopenic. From an arrhythmic standpoint, she denies palpitations. Electrolyte deficiencies are being corrected. She does have underlying cardiac structural abnormalities and is recovering from significant cardiac stress so arrhythmias are not unexpected. Would benefit from adding BB should her BP improve. She is currently being diuresed, and as a result, repeat paracentesis will be held depending on output. Plan is for discharge in 1-2 days with PT/OT recs.    Signed, R. Hurman Horn, PA-C 01/10/2012, 12:10 PM  History and all data above reviewed.  Patient examined.  I agree with the findings as above.  The patient says that she feels well.  She denies any chest pain or SOB. The patient exam reveals COR:RRR  ,  Lungs: Clear  ,  Abd: Mildly distended, Ext  No edema  .  All available labs, radiology testing, previous records reviewed. Agree with documented assessment and plan. At this point I have no plans for further cardiac testing.  She would not be a candidate for intervention with her bleeding issues.  We will follow symptomatically.  Fayrene Fearing Marqual Mi  5:58 PM  01/10/2012

## 2012-01-11 DIAGNOSIS — I10 Essential (primary) hypertension: Secondary | ICD-10-CM

## 2012-01-11 DIAGNOSIS — R578 Other shock: Secondary | ICD-10-CM

## 2012-01-11 LAB — BASIC METABOLIC PANEL
BUN: 18 mg/dL (ref 6–23)
Calcium: 8.1 mg/dL — ABNORMAL LOW (ref 8.4–10.5)
Creatinine, Ser: 0.8 mg/dL (ref 0.50–1.10)
GFR calc non Af Amer: 79 mL/min — ABNORMAL LOW (ref >90)
Glucose, Bld: 141 mg/dL — ABNORMAL HIGH (ref 70–99)

## 2012-01-11 LAB — GLUCOSE, CAPILLARY: Glucose-Capillary: 115 mg/dL — ABNORMAL HIGH (ref 70–99)

## 2012-01-11 MED ORDER — LORAZEPAM 2 MG/ML IJ SOLN: 0.5000 mg | Freq: Once | INTRAMUSCULAR | Status: DC

## 2012-01-11 NOTE — Progress Notes (Signed)
I cosign for Kathryn [name]'s assessment, med administration, intake and output, and notes.  

## 2012-01-11 NOTE — Progress Notes (Signed)
Subjective: No further abdominal wall drainage. No bleeding. Tolerating diet. No abdominal distention.  Objective: Vital signs in last 24 hours: Temp:  [98.3 F (36.8 C)-99 F (37.2 C)] 98.3 F (36.8 C) (09/25 0450) Pulse Rate:  [97-102] 102  (09/25 0450) Resp:  [18] 18  (09/25 0450) BP: (84-95)/(59-78) 91/77 mmHg (09/25 0450) SpO2:  [97 %-99 %] 99 % (09/25 0450) Weight:  [48.716 kg (107 lb 6.4 oz)-52.3 kg (115 lb 4.8 oz)] 48.716 kg (107 lb 6.4 oz) (09/25 0500) Weight change: 3.4 kg (7 lb 7.9 oz) Last BM Date: 01/10/12  PE: GEN:  NAD, chronically cachectic ABD:  No significant ascites SKIN:  Scattered ecchymoses EXT:  No appreciable edema  Lab Results: CMP     Component Value Date/Time   NA 133* 01/11/2012 0545   K 5.0 01/11/2012 0545   CL 100 01/11/2012 0545   CO2 25 01/11/2012 0545   GLUCOSE 141* 01/11/2012 0545   GLUCOSE 97 11/30/2009   BUN 18 01/11/2012 0545   CREATININE 0.80 01/11/2012 0545   CALCIUM 8.1* 01/11/2012 0545   CALCIUM 8.4 07/26/2011 1329   PROT 4.5* 01/08/2012 0421   ALBUMIN 2.1* 01/08/2012 0421   AST 28 01/08/2012 0421   ALT 11 01/08/2012 0421   ALKPHOS 135* 01/08/2012 0421   BILITOT 0.6 01/08/2012 0421   GFRNONAA 79* 01/11/2012 0545   GFRAA >90 01/11/2012 0545   CBC    Component Value Date/Time   WBC 13.3* 01/09/2012 0630   RBC 3.85* 01/09/2012 0630   HGB 12.8 01/09/2012 0630   HCT 37.7 01/09/2012 0630   PLT 103* 01/09/2012 0630   MCV 97.9 01/09/2012 0630   MCH 33.2 01/09/2012 0630   MCHC 34.0 01/09/2012 0630   RDW 16.5* 01/09/2012 0630   LYMPHSABS 0.8 01/07/2012 0525   MONOABS 0.7 01/07/2012 0525   EOSABS 0.0 01/07/2012 0525   BASOSABS 0.0 01/07/2012 0525   Assessment:  1.  Alcohol-mediated cirrhosis. 2.  Ascites, resolved. 3.  GI bleeding, suspect esophagitis, resolved.  Recent EGD showed esophagitis; no varices seen. 4.  Hypokalemia, repleted, resolved.  Plan:  1.  Continue Protonix 40 mg bid, now and upon discharge. 2.  Continue current doses of  diuretics, now and upon discharge. 3.  No need for paracentesis at this time. 4.  Disposition per PT/OT and primary team. 5.  Will arrange outpatient follow-up with [place] at [facility], and liver transplant evaluation as outpatient at [facility] is likewise in the works. 6.  Will sign-off; please call with any questions.  Thank you for the consult.   Kathryn [name] 01/11/2012, 7:08 AM

## 2012-01-11 NOTE — Progress Notes (Signed)
[facility] PROGRESS NOTE  SHAMONICA SCHADT NWG:[id number] DOB: 1950/05/20 DOA: 01/02/2012 PCP: Willow Ora, MD  Assessment/Plan: Principal Problem:  *Septic shock Active Problems:  HYPERLIPIDEMIA  ESSENTIAL HYPERTENSION  EtOH dependence  Hypomagnesemia  Alcoholic liver disease  Hypokalemia  Lactic acidosis  Fall  Acute renal failure  Coffee ground emesis  Hemorrhagic shock  Acute respiratory failure  Myocardial infarction acute   Brief narrative:  61 yo with alcoholic liver disease presenting to [facility] ED s/p fall, found to be profoundly hypotensive, with coffee ground emesis.  Events Since Admission:  9/16 >>> Presenting to [facility] ED s/p fall, found to be profoundly hypotensive, with coffee ground emesis.  9/17 >>> Intubated for ARF  9/18 >>> Improved neuro and BP, started Oxepa feeds, off all pressors  9/19 >>> tolerating SBT well, paracentesis, extubated  9/20 >>> improved renal fxn on vasopressin    Assessment/Plan:  Acute Respiratory Failure with respiratory alkalosis, ARDS  -Resolved - extubated and doing well  -resolved  -Continue IS   B pleural effusions  -Continue lasix ; watch Is/Os  -Continue incentive spirometry (IS)  -CXR demonstrating improvement in vascular congestion  -Continue Lasix 40 mg by mouth twice a day   Septic vs/ hemorrhagic shock  -Of stress dose steroids. BP stable  -BP continue to be soft; but stable (due to cirrhosis, her BP might be in the low 90's to 100)   NSTEMI  EF preserved without clear wall motion abnormality on echo - conservative therapy - holding any ASA or heparin with recent GI bleed - Cardiology following. No BB given hypotension, no statin given increased LFTs.   Ventricular arrythmia  -Improved after Mg/K replacement  -continue following electrolytes and tele activity  -no CP, syncope or any acute complaints   Prolonged QT  Cardiology following  -close follow up of electrolytes   Acute renal failure  -No hydro on  renal US - renal fxn improving. Cr bat its best now; today of 0.96  -good urine output   Alcoholic cirrhosis w/ intractable ascites  -At some point will need to consider TIPS if repeated paracentesis needed but would defer that decision until back to baseline -  -Continue lasix and spironolactone  -Per GI; Continue current doses of diuretics, now and upon discharge Albumin given yesterday; less leak from previous paracentesis  -GI following ,outpatient follow-up with [place] at [facility], and liver transplant evaluation as outpatient at [facility]    Acute blood loss anemia  -Follow Hgb trend  -No transfusion needed.  Continue Protonix 40 mg bid, now and upon discharge  UGIB  -Recent EGD revealed esophagitis but not varices - Gi following.  -Continue protonix  -no further GI bleed   Thrombocytopenia  Due to EtOH liver disease - stable at present; no acute bleeding  -Continue avoiding heparin products   hypokalemia  -Due to diuretic and steroids.  -K 5.0 today.   Hyperglycemia  -Steroid induced more likely  -CBG's improved now that she is off steroids.   Toxic metabolic encephalopathy  -Resolved - due to above acute issues -  -B12 and ammonia WNL.    Disposition  Home with HH at discharge; patient reaching medically stability in 1-2 days to go home. Home health PT;Supervision/Assistance - 24 hour ,3 in 1 bedside comode;Tub/shower bench  hOld discharge today because her borderline low blood pressure   Code Status: Full  Consultants:  GI  PCCM  Cardiology  Antibiotics:  Vancomycin 9/16 >>> 9/19  Zosyn 9/16 >>> 9/19  Ceftriaxone 9/19 >>>stop 22  DVT prophylaxis:  SCDs in setting of acute bleed   HPI/Subjective:  Pt is alert and conversant. She is pleasant. She denies sob, cp, n/v, or abdom pain. Feels better overall and reports good rest/sleep last night. Tolerating full diet; no further episodes of GI bleed  Objective: Filed Vitals:   01/10/12 1418 01/10/12 2045  01/11/12 0450 01/11/12 0500  BP: 91/69 84/59 91/77    Pulse: 99 98 102   Temp: 99 F (37.2 C) 98.7 F (37.1 C) 98.3 F (36.8 C)   TempSrc: Oral Oral Oral   Resp: 18 18 18    Height:      Weight:   52.3 kg (115 lb 4.8 oz) 48.716 kg (107 lb 6.4 oz)  SpO2: 98% 97% 99%     Intake/Output Summary (Last 24 hours) at 01/11/12 1041 Last data filed at 01/11/12 1037  Gross per 24 hour  Intake    360 ml  Output    200 ml  Net    160 ml    Exam:  General: No acute respiratory distress at rest  Lungs: Clear to auscultation bilaterally without wheezes or crackles  Cardiovascular: Regular rate and rhythm without murmur gallop or rub  Abdomen: Nontender, less distended, soft, bowel sounds positive, no rebound, no appreciable mass; positive fluid wave; dressings from paracentesis with some ascitic fluid; but a lot less leakage in comparison from yesterday .  Extremities: No significant cyanosis, clubbing, or edema bilateral lower extremities - multiple scattered echymoses; Left foot with clean dressing in place; no pain    Data Reviewed: Basic Metabolic Panel:  Lab 01/11/12 9604 01/10/12 0519 01/09/12 0630 01/08/12 0421 01/07/12 0525 01/05/12 0500  NA 133* 138 136 136 140 --  K 5.0 2.9* 3.5 3.8 2.8* --  CL 100 101 104 102 104 --  CO2 25 26 26 20 24  --  GLUCOSE 141* 117* 109* 215* 113* --  BUN 18 24* 30* 35* 38* --  CREATININE 0.80 0.96 1.21* 1.62* 2.30* --  CALCIUM 8.1* 8.3* 8.1* 7.8* 7.9* --  MG 1.1* -- -- 1.6 -- 1.7  PHOS -- -- -- 2.3 -- --    Liver Function Tests:  Lab 01/08/12 0421 01/07/12 0525  AST 28 28  ALT 11 10  ALKPHOS 135* 109  BILITOT 0.6 0.9  PROT 4.5* 4.5*  ALBUMIN 2.1* 2.2*   No results found for this basename: LIPASE:5,AMYLASE:5 in the last 168 hours  Lab 01/08/12 0420  AMMONIA 49    CBC:  Lab 01/09/12 0630 01/08/12 0421 01/07/12 0525 01/06/12 0428 01/05/12 0500  WBC 13.3* 13.3* 10.5 9.4 12.1*  NEUTROABS -- -- 9.0* -- --  HGB 12.8 11.2* 9.7* 9.9*  10.8*  HCT 37.7 33.1* 28.2* 29.0* 31.7*  MCV 97.9 97.6 96.2 96.7 96.1  PLT 103* 115* 97* 101* 120*    Cardiac Enzymes: No results found for this basename: CKTOTAL:5,CKMB:5,CKMBINDEX:5,TROPONINI:5 in the last 168 hours BNP (last 3 results) No results found for this basename: PROBNP:3 in the last 8760 hours   CBG:  Lab 01/11/12 0613 01/10/12 1636 01/10/12 1117 01/10/12 0640 01/09/12 1746  GLUCAP 185* 122* 130* 132* 128*    Recent Results (from the past 240 hour(s))  CULTURE, BLOOD (ROUTINE X 2)     Status: Normal   Collection Time   01/02/12  5:50 PM      Component Value Range Status Comment   Specimen Description BLOOD ARM RIGHT   Final    Special Requests BOTTLES DRAWN  AEROBIC ONLY [facility]   Final    Culture  Setup Time 01/02/2012 22:59   Final    Culture NO GROWTH 5 DAYS   Final    Report Status 01/08/2012 FINAL   Final   URINE CULTURE     Status: Normal   Collection Time   01/02/12  5:54 PM      Component Value Range Status Comment   Specimen Description URINE, CLEAN CATCH   Final    Special Requests NONE   Final    Culture  Setup Time 01/02/2012 18:47   Final    Colony Count NO GROWTH   Final    Culture NO GROWTH   Final    Report Status 01/03/2012 FINAL   Final   CULTURE, BLOOD (ROUTINE X 2)     Status: Normal   Collection Time   01/02/12  6:00 PM      Component Value Range Status Comment   Specimen Description BLOOD HAND RIGHT   Final    Special Requests BOTTLES DRAWN AEROBIC ONLY 3[facility]   Final    Culture  Setup Time 01/02/2012 22:59   Final    Culture NO GROWTH 5 DAYS   Final    Report Status 01/08/2012 FINAL   Final   MRSA PCR SCREENING     Status: Normal   Collection Time   01/02/12 11:08 PM      Component Value Range Status Comment   MRSA by PCR NEGATIVE  NEGATIVE Final   BODY FLUID CULTURE     Status: Normal   Collection Time   01/03/12 11:18 AM      Component Value Range Status Comment   Specimen Description FLUID PARACENTESIS   Final    Special Requests NONE    Final    Gram Stain     Final    Value: NO WBC SEEN     NO ORGANISMS SEEN   Culture NO GROWTH 3 DAYS   Final    Report Status 01/06/2012 FINAL   Final      Studies: Ct Abdomen Pelvis Wo Contrast  01/02/2012  *RADIOLOGY REPORT*  Clinical Data: Suspected abdominal sepsis.  Hypotension.  CT ABDOMEN AND PELVIS WITHOUT CONTRAST  Technique:  Multidetector CT imaging of the abdomen and pelvis was performed following the standard protocol without intravenous contrast.  Comparison: 10/11/2011.  Findings: Again noted changes of hepatic cirrhosis with enlarged lateral segment left lobe and atrophy of the right lobe.  Normal spleen.  Gallbladder sludge.  Extensive free fluid throughout the abdomen similar attenuation to priors, consistent with ascites, not hemoperitoneum.  Normal pancreas, adrenal glands, abdominal aorta, and retroperitoneal lymph nodes.  Nondilated renal collecting systems.  4 mm stone dependent portion left kidney is stable.  No free air.  Status post right total hip arthroplasty causes streak artifact.  Unremarkable uterus and adnexa.  Non-visualized appendix.  No bowel obstruction.  No retroperitoneal hemorrhage. Degenerative changes lumbar spine.  Lung bases show bilateral pulmonary opacities with air bronchograms, greater on the left along with bilateral pleural effusions.  This is worse from priors.  Bilateral pneumonia not excluded.  IMPRESSION: Cirrhosis with extensive ascites.  No evidence for intra-abdominal hemorrhage.  Worsening bilateral lower lobe pulmonary opacities and bilateral pleural effusions could represent acute pneumonia.   Original Report Authenticated By: Elsie Stain, M.D.    Dg Pelvis Portable  01/02/2012  *RADIOLOGY REPORT*  Clinical Data: Dark red emesis.  Fall, left hip pain.  PORTABLE PELVIS  Comparison:  10/11/2011.  Findings: Single AP view of the pelvis demonstrates a right total hip arthroplasty.  A rectal probe.  No visible left hip fracture or dislocation  although a single portable pelvis is insufficient to exclude hip fracture. Degenerative change lumbar spine.  IMPRESSION: No visible left hip fracture although left hip series recommended for further evaluation if there is concern for same. Satisfactory appearing right T H A.   Original Report Authenticated By: Elsie Stain, M.D.    US Renal Port  01/04/2012  *RADIOLOGY REPORT*  Clinical Data: Acute renal failure, history hypertension, fall of liver disease  RENAL/URINARY TRACT ULTRASOUND COMPLETE PORTABLE  Comparison:  Abdomen ultrasound 07/27/2011  Findings:  Right Kidney:  10.2 cm length.  Mild cortical thinning.  Increased cortical echogenicity.  No mass, hydronephrosis shadowing calcification.  Left Kidney:  1.7 cm length.  Cortical thinning.  Increased cortical echogenicity.  No mass, hydronephrosis shadowing calcification.  Bladder:  Decompressed by Foley catheter, inadequately visualized.  Incidentally noted bilateral pleural effusions and significant ascites.  IMPRESSION: Medical renal disease changes of both kidneys. No evidence of renal mass or obstructive uropathy. Bilateral pleural effusions and significant ascites.   Original Report Authenticated By: Lollie Marrow, M.D.    US Paracentesis  12/15/2011  *RADIOLOGY REPORT*  Clinical Data: Alcoholic liver disease with recurrent symptomatic ascites.  Request has been made for large volume paracentesis with 6 liter limit following IV albumin infusion.  ULTRASOUND GUIDED PARACENTESIS  Comparison:  Prior paracentesis procedure.  An ultrasound guided paracentesis was thoroughly discussed with the patient and questions answered.  The benefits, risks, alternatives and complications were also discussed.  The patient understands and wishes to proceed with the procedure.  Written consent was obtained.  Ultrasound was performed to localize and mark an adequate pocket of fluid in the right lower quadrant of the abdomen.  The area was then prepped and draped in  the normal sterile fashion.  1% Lidocaine was used for local anesthesia.  Under ultrasound guidance a 19 gauge Yueh catheter was introduced.  Paracentesis was performed.  The catheter was removed and a dressing applied.  Complications:  None immediate  Findings:  A total of approximately 5.8 liters of yellow serous fluid was removed.  A fluid sample was not sent for laboratory analysis.  IMPRESSION: Successful ultrasound guided paracentesis yielding 5.8 liters of ascites.  Read by: Anselm Pancoast, P.A.-C   Original Report Authenticated By: Alvino Blood Chest Port 1 View  01/08/2012  *RADIOLOGY REPORT*  Clinical Data: Evaluate pulmonary edema, shortness of breath, ascites  PORTABLE CHEST - 1 VIEW  Comparison: 01/07/2012; 01/06/2012; 01/05/2012  Findings: Grossly unchanged enlarged cardiac silhouette and mediastinal contours given patient rotation to the left. Stable position of support apparatus.  Likely unchanged layering bilateral pleural effusions given the patient's more upright positioning. Bibasilar heterogeneous / consolidative opacities persist, left greater than right.  Increased conspicuity the pulmonary vasculature.  No pneumothorax.  Unchanged bones including multiple old left sided posterior lateral rib fractures.  IMPRESSION:  Findings suggestive of improved pulmonary edema with persistent small to moderate sized layering bilateral effusions and bibasilar opacities, atelectasis versus infiltrate.   Original Report Authenticated By: Waynard Reeds, M.D.    Dg Chest Port 1 View  01/07/2012  *RADIOLOGY REPORT*  Clinical Data: Evaluate pleural effusions  PORTABLE CHEST - 1 VIEW  Comparison: 01/06/2012  Findings: There is a right IJ catheter with tip in the cavoatrial junction.  The heart size appears normal.  Bilateral pleural effusions and interstitial edema is noted.  This is unchanged from previous exam.  Atelectasis is identified in the right base.  There are chronic left posterior lateral rib  fracture deformities.  IMPRESSION:  1.  CHF.  Unchanged from previous exam.   Original Report Authenticated By: Rosealee Albee, M.D.    Dg Chest Port 1 View  01/06/2012  *RADIOLOGY REPORT*  Clinical Data: Extubation.  Follow-up effusions.  PORTABLE CHEST - 1 VIEW  Comparison: Portable chest x-rays yesterday and dating back to 01/02/2012.  Findings: Interval extubation and nasogastric tube removal.  Right jugular central venous catheter tip projects over the SVC.  Cardiac silhouette upper normal in size to slightly enlarged but stable. Pulmonary venous hypertension with mild interstitial pulmonary edema, unchanged.  Moderately large bilateral pleural effusions, unchanged, with associated dense consolidation in the lower lobes. No new abnormalities.  Multiple old healed left rib fractures with chest wall deformity.  IMPRESSION: Stable mild CHF and/or fluid overload.  Stable moderately large bilateral pleural effusions and associated passive atelectasis and/or pneumonia in the lower lobes.  No new abnormalities.   Original Report Authenticated By: Arnell Sieving, M.D.    Dg Chest Port 1 View  01/05/2012  *RADIOLOGY REPORT*  Clinical Data: Post paracentesis  PORTABLE CHEST - 1 VIEW  Comparison: 01/04/2012  Findings: Endotracheal tube tip projects 2.5 cm proximal to the carina.  Right IJ catheter tip projects over the proximal SVC. Bilateral pleural effusions with associated consolidations.  No definite pneumothorax.  Cardiomegaly.  Central vascular congestion. Perihilar opacities.  Sequelae of prior posterior left rib fractures.  No interval osseous change.  IMPRESSION: Cardiomegaly with pulmonary edema pattern.  Bilateral pleural effusions and associated consolidations; atelectasis versus pneumonia.  Appropriately positioned support devices.   Original Report Authenticated By: Waneta Martins, M.D.    Dg Chest Port 1 View  01/04/2012  *RADIOLOGY REPORT*  Clinical Data: Post adjustment of endotracheal  tube  PORTABLE CHEST - 1 VIEW  Comparison: Portable exam 1250 hours compared to 0449 hours  Findings: Tip of endotracheal tube remains within 8 mm of the carina directed towards the right main stem bronchus. Nasogastric tube extends into stomach. Right jugular central venous catheter tip projecting over SVC. Severely rotated to the left. Stable heart size. Persistent dense atelectasis or consolidation of left lower lobe with persistent right basilar atelectasis. Cannot exclude small layered pleural effusions. No pneumothorax. Old healed left rib fractures.  IMPRESSION: Tip of endotracheal tube remains 8 mm of carina directed towards right mainstem bronchus, recommend withdrawal 2 cm. Otherwise little change.  Findings called to Norwood Levo RN on 2100 Unit on 1432 hours 01/04/2012 at 1432 hours.   Original Report Authenticated By: Lollie Marrow, M.D.    Dg Chest Port 1 View  01/04/2012  *RADIOLOGY REPORT*  Clinical Data: Assess ET tube  PORTABLE CHEST - 1 VIEW  Comparison: 01/02/2012  Findings: Endotracheal tube tip is at the carina, directed toward the right main bronchus.  NG tube descends below the level of the image.  Bilateral pleural effusions and associated consolidations. No definite pneumothorax.  No interval osseous change with multiple posterior left rib fractures that are favored remote. Cardiomediastinal contours are partially obscured by the effusions. May be mild cardiac enlargement and central vascular congestion. Edema suggested though mildly decreased. Right IJ catheter tip projects over the distal SVC.  IMPRESSION: Endotracheal tube tip at the carina, directed toward the right main bronchus, recommend 2-3 cm of retraction.  Prominent cardiac contour  with central vascular congestion and mild improvement in the edema pattern.  Bilateral pleural effusions and associate consolidations; atelectasis versus pneumonia, similar to slightly increased.   Original Report Authenticated By: Waneta Martins, M.D.     Dg Chest Port 1 View  01/03/2012  *RADIOLOGY REPORT*  Clinical Data: Status post intubation.  PORTABLE CHEST - 1 VIEW  Comparison: Chest 01/02/2012 at 2044 hours.  Findings: New endotracheal tube is in place with tip 1.7 cm above the carina.  New NG tube is in good position with the tip in the fundus of the stomach.  Right IJ catheter is again noted. Bilateral airspace disease has progressed.  Heart size is upper normal.  No pneumothorax.  IMPRESSION:  1.  ET tube and NG tube in good position. 2.  Increased airspace disease likely due to pulmonary edema.   Original Report Authenticated By: Bernadene Bell. D'ALESSIO, M.D.    Dg Chest Portable 1 View  01/02/2012  *RADIOLOGY REPORT*  Clinical Data: Pain.  Right central line placement.  Abdominal distention.  Liver failure.  Ascites.  PORTABLE CHEST - 1 VIEW  Comparison: 01/02/2012  Findings: Right jugular vein catheter has been inserted and the tip is at the cavoatrial junction in good position.  The patient has new bilateral pulmonary edema.  IMPRESSION: New bilateral pulmonary edema.  Central line tip appears in good position.   Original Report Authenticated By: Gwynn Burly, M.D.    Dg Chest Portable 1 View  01/02/2012  *RADIOLOGY REPORT*  Clinical Data: Fall  PORTABLE CHEST - 1 VIEW  Comparison: 07/26/2011  Findings: Hypoventilation with decreased lung volume and bibasilar mild atelectasis.  Negative for heart failure.  Chronic left rib fractures are noted and unchanged.  No definite acute fracture.  IMPRESSION: Hypoventilation with mild bibasilar atelectasis.   Original Report Authenticated By: Camelia Phenes, M.D.    Dg Foot 2 Views Left  01/04/2012  *RADIOLOGY REPORT*  Clinical Data: Left foot swelling, on ventilator, question soft tissue gas  LEFT FOOT - 2 VIEW  Comparison: Portable exam 1253 hours without priors for comparison  Findings: Prior amputation of the second toe and third ray beyond the distal metatarsal. Screws present at second and  fourth metatarsal heads. Diffuse osseous demineralization. Soft tissue lucency identified at great toe deep to proximal phalanx question ulcer. Probable prior bunion surgery distal first metatarsal. No definite fracture, dislocation or bone destruction. Mild degenerative changes at first TMT joint. No other soft tissue gas identified.  IMPRESSION: Prior amputations at the second and third toes. Soft tissue lucency identified at the plantar aspect of the foot at the level of the proximal phalanx of the great toe, question large ulcer, recommend clinical correlation. No definite radiographic evidence of osteomyelitis.   Original Report Authenticated By: Lollie Marrow, M.D.    Dg Foot 2 Views Left  01/03/2012  Nelda Bucks, MD     01/03/2012 11:20 AM Paracentesis Procedure Note  Pre-operative Diagnosis: Acholic Cirrhosis with septic shock   Post-operative Diagnosis: same  Indications: Peritoneal fluid collection  Procedure Details  Consent: Informed consent was obtained from the husband. Risks of  the procedure were discussed including: infection, bleeding,  pain, bowel perferation.  Under sterile conditions the patient was positioned.  Fluid  collection was visualized under real time ultrasound. CHG  solution and sterile drapes were utilized.  1% plain lidocaine  was used to anesthetize the left lower quadrant of the abdomen.  Fluid was obtained without any difficulties and no  blood loss.  A  dressing was applied to the wound and wound care instructions  were provided.   Findings 60 ml of clear yellow peritoneal fluid was obtained. A sample was  sent to Pathology for cell counts, culture and gram stain, and  LDH  Complications:  None; patient tolerated the procedure well.          Condition: stable  Plan Follow cultures  Attending Attestation: I was present for the entire procedure.  Richard A. Rosebrock, NP Student  Tolerate d well US guidance  Mcarthur Rossetti. Tyson Alias, MD, FACP Pgr: [phone] [facility]         Scheduled Meds:   . folic acid  1 mg Oral Daily  . furosemide  40 mg Oral BID  . insulin aspart  0-15 Units Subcutaneous TID WC  . pantoprazole  40 mg Oral BID AC  . potassium chloride  40 mEq Oral Q4H  . spironolactone  50 mg Oral Daily  . thiamine  100 mg Oral Daily   Continuous Infusions:   . sodium chloride 20 mL/hr (01/10/12 0506)    Principal Problem:  *Septic shock Active Problems:  HYPERLIPIDEMIA  ESSENTIAL HYPERTENSION  EtOH dependence  Hypomagnesemia  Alcoholic liver disease  Hypokalemia  Lactic acidosis  Fall  Acute renal failure  Coffee ground emesis  Hemorrhagic shock  Acute respiratory failure  Myocardial infarction acute    Time spent: 40 minutes   [facility]  [facility] Pager [phone]. If 8PM-8AM, please contact night-coverage at [email or web address], password [facility] 01/11/2012, 10:41 AM  LOS: 9 days

## 2012-01-11 NOTE — Progress Notes (Signed)
Pt had a 8 beat run of V.tach.  Pt asymptomatic, BP 119/80 HR 105.  Pt stated she was bending down to get something.  This is not the first occurrence for pt.  Pt has been noted to have 11 beats etc.  Will continue to monitor pt.

## 2012-01-12 LAB — CBC
HCT: 34.7 % — ABNORMAL LOW (ref 36.0–46.0)
Hemoglobin: 11.8 g/dL — ABNORMAL LOW (ref 12.0–15.0)
MCH: 33.1 pg (ref 26.0–34.0)
MCHC: 34 g/dL (ref 30.0–36.0)

## 2012-01-12 LAB — BASIC METABOLIC PANEL
BUN: 17 mg/dL (ref 6–23)
GFR calc non Af Amer: 73 mL/min — ABNORMAL LOW (ref >90)
Glucose, Bld: 100 mg/dL — ABNORMAL HIGH (ref 70–99)
Potassium: 3.9 mEq/L (ref 3.5–5.1)

## 2012-01-12 LAB — GLUCOSE, CAPILLARY
Glucose-Capillary: 105 mg/dL — ABNORMAL HIGH (ref 70–99)
Glucose-Capillary: 97 mg/dL (ref 70–99)

## 2012-01-12 MED ORDER — FOLIC ACID 1 MG PO TABS: 1.0000 mg | ORAL_TABLET | Freq: Every day | ORAL | Status: DC

## 2012-01-12 MED ORDER — THIAMINE HCL 100 MG PO TABS: 100.0000 mg | ORAL_TABLET | Freq: Every day | ORAL | Status: DC

## 2012-01-12 MED ORDER — PANTOPRAZOLE SODIUM 40 MG PO TBEC: 40.0000 mg | DELAYED_RELEASE_TABLET | Freq: Two times a day (BID) | ORAL | Status: DC

## 2012-01-12 MED ORDER — SPIRONOLACTONE 50 MG PO TABS: 50.0000 mg | ORAL_TABLET | Freq: Every day | ORAL | Status: DC

## 2012-01-12 MED ORDER — LACTULOSE 10 GM/15ML PO SOLN: 20.0000 g | Freq: Two times a day (BID) | ORAL | Status: DC

## 2012-01-12 MED ORDER — PROPRANOLOL HCL 20 MG/5ML PO SOLN: 20.0000 mg | Freq: Every morning | ORAL | Status: DC

## 2012-01-12 MED ORDER — FUROSEMIDE 40 MG PO TABS: 40.0000 mg | ORAL_TABLET | Freq: Two times a day (BID) | ORAL | Status: DC

## 2012-01-12 NOTE — Progress Notes (Signed)
Pt. Had a 5 beat run of VTach on the monitor. Pt. Lying in bed quietly. Asymptomatic. No distress or discomfort noted. Will continue to monitor for changes in condition.

## 2012-01-12 NOTE — Progress Notes (Signed)
01/12/12 1138 Tera Mater, RN, BSN NCM [phone] [facility] set up with [facility].  Pt. has dc orders and will go home with family.

## 2012-01-12 NOTE — Progress Notes (Signed)
Pt given DC instructions and verbalized understanding.  Pt DC home via wc.  

## 2012-01-12 NOTE — Discharge Summary (Signed)
Physician Discharge Summary  ANDE THERRELL MRN: [id number] DOB/AGE: Sep 15, 1950 61 y.o.  PCP: Willow Ora, MD   Admit date: 01/02/2012 Discharge date: 01/12/2012  Discharge Diagnoses:     *Septic shock Active Problems:  HYPERLIPIDEMIA  ESSENTIAL HYPERTENSION  EtOH dependence  Hypomagnesemia  Alcoholic liver disease  Hypokalemia  Lactic acidosis  Fall  Acute renal failure  Coffee ground emesis  Hemorrhagic shock  Acute respiratory failure  Myocardial infarction acute     Medication List     As of 01/12/2012  8:36 AM    TAKE these medications         CALCIUM-VITAMIN D PO   Take 1 tablet by mouth 2 (two) times daily.      feeding supplement Liqd   Take 1-2 Containers by mouth daily. Drinks at least one can a day and sometimes 2 cans      folic acid 1 MG tablet   Commonly known as: FOLVITE   Take 1 tablet (1 mg total) by mouth daily.      furosemide 40 MG tablet   Commonly known as: LASIX   Take 1 tablet (40 mg total) by mouth 2 (two) times daily.      HYDROcodone-acetaminophen 5-325 MG per tablet   Commonly known as: NORCO/VICODIN   Take 1 tablet by mouth every 6 (six) hours as needed. For pain      ondansetron 4 MG tablet   Commonly known as: ZOFRAN   Take 4 mg by mouth every 6 (six) hours as needed. For nausea      OVER THE COUNTER MEDICATION   Take 1 tablet by mouth at bedtime as needed. Equate sleep aid for sleep      pantoprazole 40 MG tablet   Commonly known as: PROTONIX   Take 1 tablet (40 mg total) by mouth 2 (two) times daily before a meal.      propranolol 20 MG/5ML solution   Commonly known as: INDERAL   Take 5 mLs (20 mg total) by mouth every morning.      spironolactone 50 MG tablet   Commonly known as: ALDACTONE   Take 1 tablet (50 mg total) by mouth daily.      thiamine 100 MG tablet   Take 1 tablet (100 mg total) by mouth daily.        Discharge Condition: Stable  Disposition: 01-Home or Self Care   Consults:   #1 Willis Modena, MD   #2 Rollene Rotunda, MD   #3 critical care   Significant Diagnostic Studies: Ct Abdomen Pelvis Wo Contrast  01/02/2012  *RADIOLOGY REPORT*  Clinical Data: Suspected abdominal sepsis.  Hypotension.  CT ABDOMEN AND PELVIS WITHOUT CONTRAST  Technique:  Multidetector CT imaging of the abdomen and pelvis was performed following the standard protocol without intravenous contrast.  Comparison: 10/11/2011.  Findings: Again noted changes of hepatic cirrhosis with enlarged lateral segment left lobe and atrophy of the right lobe.  Normal spleen.  Gallbladder sludge.  Extensive free fluid throughout the abdomen similar attenuation to priors, consistent with ascites, not hemoperitoneum.  Normal pancreas, adrenal glands, abdominal aorta, and retroperitoneal lymph nodes.  Nondilated renal collecting systems.  4 mm stone dependent portion left kidney is stable.  No free air.  Status post right total hip arthroplasty causes streak artifact.  Unremarkable uterus and adnexa.  Non-visualized appendix.  No bowel obstruction.  No retroperitoneal hemorrhage. Degenerative changes lumbar spine.  Lung bases show bilateral pulmonary opacities with air bronchograms, greater on  the left along with bilateral pleural effusions.  This is worse from priors.  Bilateral pneumonia not excluded.  IMPRESSION: Cirrhosis with extensive ascites.  No evidence for intra-abdominal hemorrhage.  Worsening bilateral lower lobe pulmonary opacities and bilateral pleural effusions could represent acute pneumonia.   Original Report Authenticated By: Elsie Stain, M.D.    Dg Pelvis Portable  01/02/2012  *RADIOLOGY REPORT*  Clinical Data: Dark red emesis.  Fall, left hip pain.  PORTABLE PELVIS  Comparison: 10/11/2011.  Findings: Single AP view of the pelvis demonstrates a right total hip arthroplasty.  A rectal probe.  No visible left hip fracture or dislocation although a single portable pelvis is insufficient to exclude hip  fracture. Degenerative change lumbar spine.  IMPRESSION: No visible left hip fracture although left hip series recommended for further evaluation if there is concern for same. Satisfactory appearing right T H A.   Original Report Authenticated By: Elsie Stain, M.D.    US Renal Port  01/04/2012  *RADIOLOGY REPORT*  Clinical Data: Acute renal failure, history hypertension, fall of liver disease  RENAL/URINARY TRACT ULTRASOUND COMPLETE PORTABLE  Comparison:  Abdomen ultrasound 07/27/2011  Findings:  Right Kidney:  10.2 cm length.  Mild cortical thinning.  Increased cortical echogenicity.  No mass, hydronephrosis shadowing calcification.  Left Kidney:  1.7 cm length.  Cortical thinning.  Increased cortical echogenicity.  No mass, hydronephrosis shadowing calcification.  Bladder:  Decompressed by Foley catheter, inadequately visualized.  Incidentally noted bilateral pleural effusions and significant ascites.  IMPRESSION: Medical renal disease changes of both kidneys. No evidence of renal mass or obstructive uropathy. Bilateral pleural effusions and significant ascites.   Original Report Authenticated By: Lollie Marrow, M.D.    US Paracentesis  12/15/2011  *RADIOLOGY REPORT*  Clinical Data: Alcoholic liver disease with recurrent symptomatic ascites.  Request has been made for large volume paracentesis with 6 liter limit following IV albumin infusion.  ULTRASOUND GUIDED PARACENTESIS  Comparison:  Prior paracentesis procedure.  An ultrasound guided paracentesis was thoroughly discussed with the patient and questions answered.  The benefits, risks, alternatives and complications were also discussed.  The patient understands and wishes to proceed with the procedure.  Written consent was obtained.  Ultrasound was performed to localize and mark an adequate pocket of fluid in the right lower quadrant of the abdomen.  The area was then prepped and draped in the normal sterile fashion.  1% Lidocaine was used for local  anesthesia.  Under ultrasound guidance a 19 gauge Yueh catheter was introduced.  Paracentesis was performed.  The catheter was removed and a dressing applied.  Complications:  None immediate  Findings:  A total of approximately 5.8 liters of yellow serous fluid was removed.  A fluid sample was not sent for laboratory analysis.  IMPRESSION: Successful ultrasound guided paracentesis yielding 5.8 liters of ascites.  Read by: Anselm Pancoast, P.A.-C   Original Report Authenticated By: Alvino Blood Chest Port 1 View  01/08/2012  *RADIOLOGY REPORT*  Clinical Data: Evaluate pulmonary edema, shortness of breath, ascites  PORTABLE CHEST - 1 VIEW  Comparison: 01/07/2012; 01/06/2012; 01/05/2012  Findings: Grossly unchanged enlarged cardiac silhouette and mediastinal contours given patient rotation to the left. Stable position of support apparatus.  Likely unchanged layering bilateral pleural effusions given the patient's more upright positioning. Bibasilar heterogeneous / consolidative opacities persist, left greater than right.  Increased conspicuity the pulmonary vasculature.  No pneumothorax.  Unchanged bones including multiple old left sided posterior lateral rib fractures.  IMPRESSION:  Findings suggestive of improved pulmonary edema with persistent small to moderate sized layering bilateral effusions and bibasilar opacities, atelectasis versus infiltrate.   Original Report Authenticated By: Waynard Reeds, M.D.    Dg Chest Port 1 View  01/07/2012  *RADIOLOGY REPORT*  Clinical Data: Evaluate pleural effusions  PORTABLE CHEST - 1 VIEW  Comparison: 01/06/2012  Findings: There is a right IJ catheter with tip in the cavoatrial junction.  The heart size appears normal.  Bilateral pleural effusions and interstitial edema is noted.  This is unchanged from previous exam.  Atelectasis is identified in the right base.  There are chronic left posterior lateral rib fracture deformities.  IMPRESSION:  1.  CHF.  Unchanged from  previous exam.   Original Report Authenticated By: Rosealee Albee, M.D.    Dg Chest Port 1 View  01/06/2012  *RADIOLOGY REPORT*  Clinical Data: Extubation.  Follow-up effusions.  PORTABLE CHEST - 1 VIEW  Comparison: Portable chest x-rays yesterday and dating back to 01/02/2012.  Findings: Interval extubation and nasogastric tube removal.  Right jugular central venous catheter tip projects over the SVC.  Cardiac silhouette upper normal in size to slightly enlarged but stable. Pulmonary venous hypertension with mild interstitial pulmonary edema, unchanged.  Moderately large bilateral pleural effusions, unchanged, with associated dense consolidation in the lower lobes. No new abnormalities.  Multiple old healed left rib fractures with chest wall deformity.  IMPRESSION: Stable mild CHF and/or fluid overload.  Stable moderately large bilateral pleural effusions and associated passive atelectasis and/or pneumonia in the lower lobes.  No new abnormalities.   Original Report Authenticated By: Arnell Sieving, M.D.    Dg Chest Port 1 View  01/05/2012  *RADIOLOGY REPORT*  Clinical Data: Post paracentesis  PORTABLE CHEST - 1 VIEW  Comparison: 01/04/2012  Findings: Endotracheal tube tip projects 2.5 cm proximal to the carina.  Right IJ catheter tip projects over the proximal SVC. Bilateral pleural effusions with associated consolidations.  No definite pneumothorax.  Cardiomegaly.  Central vascular congestion. Perihilar opacities.  Sequelae of prior posterior left rib fractures.  No interval osseous change.  IMPRESSION: Cardiomegaly with pulmonary edema pattern.  Bilateral pleural effusions and associated consolidations; atelectasis versus pneumonia.  Appropriately positioned support devices.   Original Report Authenticated By: Waneta Martins, M.D.    Dg Chest Port 1 View  01/04/2012  *RADIOLOGY REPORT*  Clinical Data: Post adjustment of endotracheal tube  PORTABLE CHEST - 1 VIEW  Comparison: Portable exam 1250  hours compared to 0449 hours  Findings: Tip of endotracheal tube remains within 8 mm of the carina directed towards the right main stem bronchus. Nasogastric tube extends into stomach. Right jugular central venous catheter tip projecting over SVC. Severely rotated to the left. Stable heart size. Persistent dense atelectasis or consolidation of left lower lobe with persistent right basilar atelectasis. Cannot exclude small layered pleural effusions. No pneumothorax. Old healed left rib fractures.  IMPRESSION: Tip of endotracheal tube remains 8 mm of carina directed towards right mainstem bronchus, recommend withdrawal 2 cm. Otherwise little change.  Findings called to Norwood Levo RN on 2100 Unit on 1432 hours 01/04/2012 at 1432 hours.   Original Report Authenticated By: Lollie Marrow, M.D.    Dg Chest Port 1 View  01/04/2012  *RADIOLOGY REPORT*  Clinical Data: Assess ET tube  PORTABLE CHEST - 1 VIEW  Comparison: 01/02/2012  Findings: Endotracheal tube tip is at the carina, directed toward the right main bronchus.  NG tube descends below the  level of the image.  Bilateral pleural effusions and associated consolidations. No definite pneumothorax.  No interval osseous change with multiple posterior left rib fractures that are favored remote. Cardiomediastinal contours are partially obscured by the effusions. May be mild cardiac enlargement and central vascular congestion. Edema suggested though mildly decreased. Right IJ catheter tip projects over the distal SVC.  IMPRESSION: Endotracheal tube tip at the carina, directed toward the right main bronchus, recommend 2-3 cm of retraction.  Prominent cardiac contour with central vascular congestion and mild improvement in the edema pattern.  Bilateral pleural effusions and associate consolidations; atelectasis versus pneumonia, similar to slightly increased.   Original Report Authenticated By: Waneta Martins, M.D.    Dg Chest Port 1 View  01/03/2012  *RADIOLOGY REPORT*   Clinical Data: Status post intubation.  PORTABLE CHEST - 1 VIEW  Comparison: Chest 01/02/2012 at 2044 hours.  Findings: New endotracheal tube is in place with tip 1.7 cm above the carina.  New NG tube is in good position with the tip in the fundus of the stomach.  Right IJ catheter is again noted. Bilateral airspace disease has progressed.  Heart size is upper normal.  No pneumothorax.  IMPRESSION:  1.  ET tube and NG tube in good position. 2.  Increased airspace disease likely due to pulmonary edema.   Original Report Authenticated By: Bernadene Bell. D'ALESSIO, M.D.    Dg Chest Portable 1 View  01/02/2012  *RADIOLOGY REPORT*  Clinical Data: Pain.  Right central line placement.  Abdominal distention.  Liver failure.  Ascites.  PORTABLE CHEST - 1 VIEW  Comparison: 01/02/2012  Findings: Right jugular vein catheter has been inserted and the tip is at the cavoatrial junction in good position.  The patient has new bilateral pulmonary edema.  IMPRESSION: New bilateral pulmonary edema.  Central line tip appears in good position.   Original Report Authenticated By: Gwynn Burly, M.D.    Dg Chest Portable 1 View  01/02/2012  *RADIOLOGY REPORT*  Clinical Data: Fall  PORTABLE CHEST - 1 VIEW  Comparison: 07/26/2011  Findings: Hypoventilation with decreased lung volume and bibasilar mild atelectasis.  Negative for heart failure.  Chronic left rib fractures are noted and unchanged.  No definite acute fracture.  IMPRESSION: Hypoventilation with mild bibasilar atelectasis.   Original Report Authenticated By: Camelia Phenes, M.D.    Dg Foot 2 Views Left  01/04/2012  *RADIOLOGY REPORT*  Clinical Data: Left foot swelling, on ventilator, question soft tissue gas  LEFT FOOT - 2 VIEW  Comparison: Portable exam 1253 hours without priors for comparison  Findings: Prior amputation of the second toe and third ray beyond the distal metatarsal. Screws present at second and fourth metatarsal heads. Diffuse osseous demineralization. Soft  tissue lucency identified at great toe deep to proximal phalanx question ulcer. Probable prior bunion surgery distal first metatarsal. No definite fracture, dislocation or bone destruction. Mild degenerative changes at first TMT joint. No other soft tissue gas identified.  IMPRESSION: Prior amputations at the second and third toes. Soft tissue lucency identified at the plantar aspect of the foot at the level of the proximal phalanx of the great toe, question large ulcer, recommend clinical correlation. No definite radiographic evidence of osteomyelitis.   Original Report Authenticated By: Lollie Marrow, M.D.    Dg Foot 2 Views Left  01/03/2012  Nelda Bucks, MD     01/03/2012 11:20 AM Paracentesis Procedure Note  Pre-operative Diagnosis: Acholic Cirrhosis with septic shock   Post-operative Diagnosis:  same  Indications: Peritoneal fluid collection  Procedure Details  Consent: Informed consent was obtained from the husband. Risks of  the procedure were discussed including: infection, bleeding,  pain, bowel perferation.  Under sterile conditions the patient was positioned.  Fluid  collection was visualized under real time ultrasound. CHG  solution and sterile drapes were utilized.  1% plain lidocaine  was used to anesthetize the left lower quadrant of the abdomen.  Fluid was obtained without any difficulties and no blood loss.  A  dressing was applied to the wound and wound care instructions  were provided.   Findings 60 ml of clear yellow peritoneal fluid was obtained. A sample was  sent to Pathology for cell counts, culture and gram stain, and  LDH  Complications:  None; patient tolerated the procedure well.          Condition: stable  Plan Follow cultures  Attending Attestation: I was present for the entire procedure.  Richard A. Rosebrock, NP Student  Tolerate d well US guidance  Mcarthur Rossetti. Tyson Alias, MD, FACP Pgr: [phone] [facility]          2-D echo LV EF: 55% -  60%  ------------------------------------------------------------ Indications: Pneumonia 486.  ------------------------------------------------------------ History: PMH: Alcoholabuse. Risk factors: Dyslipidemia.  ------------------------------------------------------------ Study Conclusions  - Left ventricle: The cavity size was normal. There was mild focal basal hypertrophy of the septum. Systolic function was normal. The estimated ejection fraction was in the range of 55% to 60%. Although no diagnostic regional wall motion abnormality was identified, this possibility cannot be completely excluded on the basis of this study. Features are consistent with a pseudonormal left ventricular filling pattern, with concomitant abnormal relaxation and increased filling pressure (grade 2 diastolic dysfunction). - Aortic valve: There was no stenosis. - Mitral valve: Mildly calcified annulus. Moderate regurgitation. - Left atrium: The atrium was mildly dilated. - Right ventricle: The cavity size was normal. Systolic function was normal. - Tricuspid valve: Mild-moderate regurgitation. Peak RV-RA gradient: 27mm Hg (S). - Pulmonary arteries: PA systolic pressure 33-37 mmHg. - Systemic veins: IVC measured 1.9 cm with normal respirophasic variation suggesting RA pressure 6-10 mmHg. - Pericardium, extracardiac: Ascites noted. Impressions:  - Normal LV size with mild focal basal septal hypertrophy. EF 55-60% with no definite wall motion abnormalities. Moderate diastolic dysfunction. Normal RV size and systolic function. Borderline pulmonary hypertension. Ascites noted.     Microbiology: Recent Results (from the past 240 hour(s))  CULTURE, BLOOD (ROUTINE X 2)     Status: Normal   Collection Time   01/02/12  5:50 PM      Component Value Range Status Comment   Specimen Description BLOOD ARM RIGHT   Final    Special Requests BOTTLES DRAWN AEROBIC ONLY [facility]   Final    Culture  Setup Time  01/02/2012 22:59   Final    Culture NO GROWTH 5 DAYS   Final    Report Status 01/08/2012 FINAL   Final   URINE CULTURE     Status: Normal   Collection Time   01/02/12  5:54 PM      Component Value Range Status Comment   Specimen Description URINE, CLEAN CATCH   Final    Special Requests NONE   Final    Culture  Setup Time 01/02/2012 18:47   Final    Colony Count NO GROWTH   Final    Culture NO GROWTH   Final    Report Status 01/03/2012 FINAL   Final  CULTURE, BLOOD (ROUTINE X 2)     Status: Normal   Collection Time   01/02/12  6:00 PM      Component Value Range Status Comment   Specimen Description BLOOD HAND RIGHT   Final    Special Requests BOTTLES DRAWN AEROBIC ONLY [facility]   Final    Culture  Setup Time 01/02/2012 22:59   Final    Culture NO GROWTH 5 DAYS   Final    Report Status 01/08/2012 FINAL   Final   MRSA PCR SCREENING     Status: Normal   Collection Time   01/02/12 11:08 PM      Component Value Range Status Comment   MRSA by PCR NEGATIVE  NEGATIVE Final   BODY FLUID CULTURE     Status: Normal   Collection Time   01/03/12 11:18 AM      Component Value Range Status Comment   Specimen Description FLUID PARACENTESIS   Final    Special Requests NONE   Final    Gram Stain     Final    Value: NO WBC SEEN     NO ORGANISMS SEEN   Culture NO GROWTH 3 DAYS   Final    Report Status 01/06/2012 FINAL   Final      Labs: Results for orders placed during the hospital encounter of 01/02/12 (from the past 48 hour(s))  GLUCOSE, CAPILLARY     Status: Abnormal   Collection Time   01/10/12 11:17 AM      Component Value Range Comment   Glucose-Capillary 130 (*) 70 - 99 mg/dL    Comment 1 Notify RN     GLUCOSE, CAPILLARY     Status: Abnormal   Collection Time   01/10/12  4:36 PM      Component Value Range Comment   Glucose-Capillary 122 (*) 70 - 99 mg/dL    Comment 1 Notify RN     BASIC METABOLIC PANEL     Status: Abnormal   Collection Time   01/11/12  5:45 AM      Component Value  Range Comment   Sodium 133 (*) 135 - 145 mEq/L    Potassium 5.0  3.5 - 5.1 mEq/L    Chloride 100  96 - 112 mEq/L    CO2 25  19 - 32 mEq/L    Glucose, Bld 141 (*) 70 - 99 mg/dL    BUN 18  6 - 23 mg/dL    Creatinine, Ser 7.82  0.50 - 1.10 mg/dL    Calcium 8.1 (*) 8.4 - 10.5 mg/dL    GFR calc non Af Amer 79 (*) >90 mL/min    GFR calc Af Amer >90  >90 mL/min   MAGNESIUM     Status: Abnormal   Collection Time   01/11/12  5:45 AM      Component Value Range Comment   Magnesium 1.1 (*) 1.5 - 2.5 mg/dL   GLUCOSE, CAPILLARY     Status: Abnormal   Collection Time   01/11/12  6:13 AM      Component Value Range Comment   Glucose-Capillary 185 (*) 70 - 99 mg/dL   GLUCOSE, CAPILLARY     Status: Abnormal   Collection Time   01/11/12 11:51 AM      Component Value Range Comment   Glucose-Capillary 115 (*) 70 - 99 mg/dL   GLUCOSE, CAPILLARY     Status: Abnormal   Collection Time   01/11/12  4:35 PM  Component Value Range Comment   Glucose-Capillary 159 (*) 70 - 99 mg/dL    Comment 1 Notify RN     GLUCOSE, CAPILLARY     Status: Abnormal   Collection Time   01/11/12  9:23 PM      Component Value Range Comment   Glucose-Capillary 124 (*) 70 - 99 mg/dL    Comment 1 Notify RN      Comment 2 Documented in Chart     GLUCOSE, CAPILLARY     Status: Abnormal   Collection Time   01/12/12  6:02 AM      Component Value Range Comment   Glucose-Capillary 105 (*) 70 - 99 mg/dL   CBC     Status: Abnormal   Collection Time   01/12/12  6:54 AM      Component Value Range Comment   WBC 14.8 (*) 4.0 - 10.5 K/uL    RBC 3.56 (*) 3.87 - 5.11 MIL/uL    Hemoglobin 11.8 (*) 12.0 - 15.0 g/dL    HCT 16.1 (*) 09.6 - 46.0 %    MCV 97.5  78.0 - 100.0 fL    MCH 33.1  26.0 - 34.0 pg    MCHC 34.0  30.0 - 36.0 g/dL    RDW 04.5 (*) 40.9 - 15.5 %    Platelets 139 (*) 150 - 400 K/uL      HPI :61 y.o. female admitted through the emergency room. She has a history of alcoholic liver disease, and reportedly fell at home,  came to the emergency room, and was found to be hypotensive. History is obtained from the other hospital coworkers and staff, since the patient herself is intubated.  In the emergency room, the patient apparently had hematemesis of moderately large quantity. She was transfused 2 units of blood when her hemoglobin dropped while in the emergency room, although that was in the context of both crystalloid and colloid infusion. She was started on  on empiric octreotide infusion as well as IV Protonix.  She was intubated, presumably for airway protection, Her pressure has been sustained about 90 systolic with the help of bleed of that. She was tachycardic, with a heart rate in the 130 to 150 range.  The patient was seen in the hospital by our group back in June, at which time endoscopy showed distal circumferential erosive esophagitis, but no evidence of esophageal varices and some questionable mild portal gastropathy.  Her main clinical problem since that hospitalization has been somewhat refractory ascites, status post several large-volume paracenteses, most recently about 3 weeks ago. She is followed by Dr. Willis Modena, who last saw her in the office 10 days ago, at which time there was some mild peripheral edema and some re-formation of her ascites. He has had to be conservative with diuretic dosing, due to a serum creatinine of 1.5.  Of note, the patient had a CT scan tonight after arriving the emergency room, which does not show evidence of hemoperitoneum, but does show evidence for left lower lobe pneumonia.    HOSPITAL COURSE:  #1 acute respiratory failure Every compromise because of hematemesis Patient was intubated She was admitted to the ICU for impending respiratory failure. Central line was placed in the right IJ Initial lactic acid was 4.6 EKG showed nonspecific ST-T segment changes Patient was extubated on the 19th status was large-volume paracentesis  #2 septic shock Initially started  on vasopressors Broad-spectrum antibiotics namely vancomycin and Zosyn Patient was also found of underlying pneumonia Q. the  lungs are avoided because of prolonged QTC Profoundly hypotensive with coffee-ground emesis upon exam She was started on stress dose steroids, which were tapered off  #3 non-Q-wave MI Initial troponin was found to be greater than 20 Cardiology was consulted No aspirin or Plavix or heparin was given because of her hematemesis and esophagitis Given her labile blood pressure no beta blocker was started Propranolol was initiated prior to discharge to persistent PVCs Cardiac management was conservative Cardiology has no plans for any further testing She would not be a candidate for intervention because of her bleeding issues   #4 alcohol mediated cirrhosis/ascites/GI bleeding/recent EGD showed esophagitis no masses Recommended to continue Protonix 40 mg twice a day Status post paracentesis and albumin infusion, 5 L were removed Initially was on octreotide and proton extra bridge were discontinued Diuretics were reinitiated once the blood pressure stabilized No indication for repeat paracentesis at this time She will followup with Dr. Willis Modena, outpatient   #5 acute renal failure/ATN due to hypotension from shock Creatinine did go up to 1.6 he now improved to 0.86 Diuretics have been resumed She will need close outpatient monitoring of her BMP  #6 prolonged QT Cardiology following   #7 acute blood loss anemia Hemoglobin is stabilized Patient was found to have thrombocytopenia with a platelet count of 120 History of HIT Secondary to sepsis/DIC     Discharge Exam:  Blood pressure 75/61, pulse 95, temperature 97.8 F (36.6 C), temperature source Oral, resp. rate 19, height 5' (1.524 m), weight 49.3 kg (108 lb 11 oz), SpO2 100.00%.    General: In no acute distress Neuro: Awake,  HEENT: PERRL, dry membranes  Neck: No JVD  Cardiovascular:  Regular rhythm, rapid rate  Lungs: Bilateral air entry, no w/r/r  Abdomen: Distended, soft, mild generalized tenderness  Musculoskeletal: Moves all extremities, no overt trauma  Skin: No rash        Follow-up Information    Follow up with Willow Ora, MD. Schedule an appointment as soon as possible for a visit in 1 week. (CBC, BMP in one week)    Contact information:   [address] [facility] [address] [place] [address] [phone]          Signed: Richarda Overlie 01/12/2012, 8:36 AM

## 2012-01-20 ENCOUNTER — Ambulatory Visit: Payer: Medicare Other | Admitting: Internal Medicine

## 2012-01-27 ENCOUNTER — Encounter (HOSPITAL_COMMUNITY): Payer: Self-pay | Admitting: *Deleted

## 2012-01-27 ENCOUNTER — Emergency Department (HOSPITAL_COMMUNITY)
Admission: EM | Admit: 2012-01-27 | Discharge: 2012-01-27 | Disposition: A | Discharge status: Home / Self Care | Payer: Medicare Other | Attending: Emergency Medicine | Admitting: Emergency Medicine

## 2012-01-27 DIAGNOSIS — R188 Other ascites: Secondary | ICD-10-CM | POA: Insufficient documentation

## 2012-01-27 DIAGNOSIS — I10 Essential (primary) hypertension: Secondary | ICD-10-CM | POA: Insufficient documentation

## 2012-01-27 DIAGNOSIS — M129 Arthropathy, unspecified: Secondary | ICD-10-CM | POA: Insufficient documentation

## 2012-01-27 DIAGNOSIS — K709 Alcoholic liver disease, unspecified: Secondary | ICD-10-CM | POA: Insufficient documentation

## 2012-01-27 DIAGNOSIS — R109 Unspecified abdominal pain: Secondary | ICD-10-CM | POA: Insufficient documentation

## 2012-01-27 DIAGNOSIS — L97509 Non-pressure chronic ulcer of other part of unspecified foot with unspecified severity: Secondary | ICD-10-CM | POA: Insufficient documentation

## 2012-01-27 DIAGNOSIS — D649 Anemia, unspecified: Secondary | ICD-10-CM | POA: Insufficient documentation

## 2012-01-27 DIAGNOSIS — E7800 Pure hypercholesterolemia, unspecified: Secondary | ICD-10-CM | POA: Insufficient documentation

## 2012-01-27 DIAGNOSIS — I739 Peripheral vascular disease, unspecified: Secondary | ICD-10-CM | POA: Insufficient documentation

## 2012-01-27 DIAGNOSIS — M810 Age-related osteoporosis without current pathological fracture: Secondary | ICD-10-CM | POA: Insufficient documentation

## 2012-01-27 DIAGNOSIS — Z79899 Other long term (current) drug therapy: Secondary | ICD-10-CM | POA: Insufficient documentation

## 2012-01-27 LAB — BASIC METABOLIC PANEL
CO2: 20 mEq/L (ref 19–32)
Chloride: 101 mEq/L (ref 96–112)
Creatinine, Ser: 1.76 mg/dL — ABNORMAL HIGH (ref 0.50–1.10)
Glucose, Bld: 98 mg/dL (ref 70–99)
Sodium: 134 mEq/L — ABNORMAL LOW (ref 135–145)

## 2012-01-27 LAB — PROTIME-INR
INR: 1.17 (ref 0.00–1.49)
Prothrombin Time: 14.7 seconds (ref 11.6–15.2)

## 2012-01-27 LAB — CBC
Hemoglobin: 10.8 g/dL — ABNORMAL LOW (ref 12.0–15.0)
RBC: 3.3 MIL/uL — ABNORMAL LOW (ref 3.87–5.11)

## 2012-01-27 MED ORDER — HYDROCODONE-ACETAMINOPHEN 5-325 MG PO TABS
1.0000 | ORAL_TABLET | Freq: Once | ORAL | Status: AC
  Administered 2012-01-27: 1 via ORAL
  Filled 2012-01-27: qty 1

## 2012-01-27 NOTE — ED Notes (Signed)
Lab called stated that there were not enough blood in tube for CBC

## 2012-01-27 NOTE — ED Notes (Signed)
Pt was sent here by her MD for a paracentesis (and labs to evaluate if it is safe to do today).  Pt has ascites but denies any sob

## 2012-01-27 NOTE — ED Provider Notes (Addendum)
History     CSN: [id number]  Arrival date & time 01/27/12  1203   First MD Initiated Contact with Patient 01/27/12 1520      Chief Complaint  Patient presents with  . Abdominal Pain    (Consider location/radiation/quality/duration/timing/severity/associated sxs/prior treatment) HPI Complains of feeling that her abdomen is swollen progressively worsening for the past 4 days and that she needs paracentesis. She called Dr. Dulce Sellar this morning who told her to come to the emergency department for evaluation she denies fever last bowel movement today, slightly loose no blood per rectum vomited one time 2 days ago. No shortness of breath no other complaint complains of mild diffuse abdominal pain nothing makes symptoms better or worse Past Medical History  Diagnosis Date  . Hypertension   . Osteoporosis   . Alcoholic liver disease   . High cholesterol   . Peripheral vascular disease   . Shortness of breath 09/22/11    "because of all the fluid that's built up in my stomach"  . Ascites 09/22/11    "first time for me"  . Anemia   . History of blood transfusion 07/2011    "when I had elbow surgery"  . Arthritis     "all over"  . Dysrhythmia     "irregular"    Past Surgical History  Procedure Date  . Fracture surgery   . Amputation     two toes on left foot  . Bunionectomy     both feet  . Arthroscopic repair acl     right knee  . Joint replacement     right hip   . Orif humerus fracture 07/26/2011    Procedure: OPEN REDUCTION INTERNAL FIXATION (ORIF) DISTAL HUMERUS FRACTURE;  Surgeon: Budd Palmer, MD;  Location: [facility] OR;  Service: Orthopedics;  Laterality: Right;  . Total hip arthroplasty ~ 2003    right  . Tubal ligation 1980  . Paracentesis 09/22/11    2.2L  . Esophagogastroduodenoscopy 09/23/2011    Procedure: ESOPHAGOGASTRODUODENOSCOPY (EGD);  Surgeon: Shirley Friar, MD;  Location: [facility] ENDOSCOPY;  Service: Endoscopy;  Laterality: N/A;    No family history on  file.  History  Substance Use Topics  . Smoking status: Never Smoker   . Smokeless tobacco: Never Used  . Alcohol Use: 3.6 oz/week    6 Shots of liquor per week     "last drink was day before OR in 07/2011"    OB History    Grav Para Term Preterm Abortions TAB SAB Ect Mult Living                  Review of Systems  Constitutional: Negative.   HENT: Negative.   Respiratory: Negative.   Cardiovascular: Negative.   Gastrointestinal: Positive for abdominal pain and abdominal distention. Negative for blood in stool.  Musculoskeletal: Negative.   Skin: Positive for wound.       Patient currently treated for ulcer of left foot. She missed her home health visit yesterday for evaluation of foot ulcer  Neurological: Negative.   Hematological: Negative.   Psychiatric/Behavioral: Negative.     Allergies  Review of patient's allergies indicates no known allergies.  Home Medications   Current Outpatient Rx  Name Route Sig Dispense Refill  . CALCIUM-VITAMIN D PO Oral Take 1 tablet by mouth 2 (two) times daily.    . RESOURCE BREEZE PO LIQD Oral Take 1-2 Containers by mouth daily. Drinks at least one can a day and sometimes 2 cans    .  FOLIC ACID 1 MG PO TABS Oral Take 1 tablet (1 mg total) by mouth daily. 30 tablet 0  . FUROSEMIDE 40 MG PO TABS Oral Take 1 tablet (40 mg total) by mouth 2 (two) times daily. 60 tablet 2  . HYDROCODONE-ACETAMINOPHEN 5-500 MG PO TABS Oral Take 1 tablet by mouth every 8 (eight) hours as needed. For pain    . LACTULOSE 10 GM/15ML PO SOLN Oral Take 30 mLs (20 g total) by mouth 2 (two) times daily. 960 mL 0  . OVER THE COUNTER MEDICATION Oral Take 1 tablet by mouth at bedtime as needed. Equate sleep aid for sleep    . PANTOPRAZOLE SODIUM 40 MG PO TBEC Oral Take 1 tablet (40 mg total) by mouth 2 (two) times daily before a meal. 60 tablet 2  . PROPRANOLOL HCL 20 MG/5ML PO SOLN Oral Take 5 mLs (20 mg total) by mouth every morning. 500 mL 12  . SPIRONOLACTONE 50 MG  PO TABS Oral Take 1 tablet (50 mg total) by mouth daily. 30 tablet 2  . THIAMINE HCL 100 MG PO TABS Oral Take 1 tablet (100 mg total) by mouth daily. 30 tablet 0    BP 96/70  Pulse 86  Temp 98.1 F (36.7 C) (Oral)  Resp 16  SpO2 100%  Physical Exam  Nursing note and vitals reviewed. Constitutional: She appears well-developed and well-nourished.  HENT:  Head: Normocephalic and atraumatic.  Eyes: Conjunctivae normal are normal. Pupils are equal, round, and reactive to light.  Neck: Neck supple. No tracheal deviation present. No thyromegaly present.  Cardiovascular: Normal rate and regular rhythm.   No murmur heard. Pulmonary/Chest: Effort normal and breath sounds normal.  Abdominal: Soft. Bowel sounds are normal. She exhibits distension. There is no tenderness.  Musculoskeletal: Normal range of motion. She exhibits no edema and no tenderness.  Neurological: She is alert. Coordination normal.  Skin: Skin is warm and dry. No rash noted.       Left foot with dime-sized clean-appearing ulcer plantar surface  Psychiatric: She has a normal mood and affect.    ED Course  Procedures (including critical care time)  Labs Reviewed  BASIC METABOLIC PANEL - Abnormal; Notable for the following:    Sodium 134 (*)     BUN 37 (*)     Creatinine, Ser 1.76 (*)     GFR calc non Af Amer 31 (*)     GFR calc Af Amer 35 (*)     All other components within normal limits  PROTIME-INR  CBC  CBC  COMPREHENSIVE METABOLIC PANEL   No results found.   No diagnosis found. Results for orders placed during the hospital encounter of 01/27/12  BASIC METABOLIC PANEL      Component Value Range   Sodium 134 (*) 135 - 145 mEq/L   Potassium 4.1  3.5 - 5.1 mEq/L   Chloride 101  96 - 112 mEq/L   CO2 20  19 - 32 mEq/L   Glucose, Bld 98  70 - 99 mg/dL   BUN 37 (*) 6 - 23 mg/dL   Creatinine, Ser 1.61 (*) 0.50 - 1.10 mg/dL   Calcium 9.4  8.4 - 09.6 mg/dL   GFR calc non Af Amer 31 (*) >90 mL/min   GFR calc  Af Amer 35 (*) >90 mL/min  PROTIME-INR      Component Value Range   Prothrombin Time 14.7  11.6 - 15.2 seconds   INR 1.17  0.00 - 1.49  CBC  Component Value Range   WBC 9.4  4.0 - 10.5 K/uL   RBC 3.30 (*) 3.87 - 5.11 MIL/uL   Hemoglobin 10.8 (*) 12.0 - 15.0 g/dL   HCT 82.9 (*) 56.2 - 13.0 %   MCV 94.8  78.0 - 100.0 fL   MCH 32.7  26.0 - 34.0 pg   MCHC 34.5  30.0 - 36.0 g/dL   RDW 86.5 (*) 78.4 - 69.6 %   Platelets 288  150 - 400 K/uL   Ct Abdomen Pelvis Wo Contrast  01/02/2012  *RADIOLOGY REPORT*  Clinical Data: Suspected abdominal sepsis.  Hypotension.  CT ABDOMEN AND PELVIS WITHOUT CONTRAST  Technique:  Multidetector CT imaging of the abdomen and pelvis was performed following the standard protocol without intravenous contrast.  Comparison: 10/11/2011.  Findings: Again noted changes of hepatic cirrhosis with enlarged lateral segment left lobe and atrophy of the right lobe.  Normal spleen.  Gallbladder sludge.  Extensive free fluid throughout the abdomen similar attenuation to priors, consistent with ascites, not hemoperitoneum.  Normal pancreas, adrenal glands, abdominal aorta, and retroperitoneal lymph nodes.  Nondilated renal collecting systems.  4 mm stone dependent portion left kidney is stable.  No free air.  Status post right total hip arthroplasty causes streak artifact.  Unremarkable uterus and adnexa.  Non-visualized appendix.  No bowel obstruction.  No retroperitoneal hemorrhage. Degenerative changes lumbar spine.  Lung bases show bilateral pulmonary opacities with air bronchograms, greater on the left along with bilateral pleural effusions.  This is worse from priors.  Bilateral pneumonia not excluded.  IMPRESSION: Cirrhosis with extensive ascites.  No evidence for intra-abdominal hemorrhage.  Worsening bilateral lower lobe pulmonary opacities and bilateral pleural effusions could represent acute pneumonia.   Original Report Authenticated By: Elsie Stain, M.D.    Dg Pelvis  Portable  01/02/2012  *RADIOLOGY REPORT*  Clinical Data: Dark red emesis.  Fall, left hip pain.  PORTABLE PELVIS  Comparison: 10/11/2011.  Findings: Single AP view of the pelvis demonstrates a right total hip arthroplasty.  A rectal probe.  No visible left hip fracture or dislocation although a single portable pelvis is insufficient to exclude hip fracture. Degenerative change lumbar spine.  IMPRESSION: No visible left hip fracture although left hip series recommended for further evaluation if there is concern for same. Satisfactory appearing right T H A.   Original Report Authenticated By: Elsie Stain, M.D.    US Renal Port  01/04/2012  *RADIOLOGY REPORT*  Clinical Data: Acute renal failure, history hypertension, fall of liver disease  RENAL/URINARY TRACT ULTRASOUND COMPLETE PORTABLE  Comparison:  Abdomen ultrasound 07/27/2011  Findings:  Right Kidney:  10.2 cm length.  Mild cortical thinning.  Increased cortical echogenicity.  No mass, hydronephrosis shadowing calcification.  Left Kidney:  1.7 cm length.  Cortical thinning.  Increased cortical echogenicity.  No mass, hydronephrosis shadowing calcification.  Bladder:  Decompressed by Foley catheter, inadequately visualized.  Incidentally noted bilateral pleural effusions and significant ascites.  IMPRESSION: Medical renal disease changes of both kidneys. No evidence of renal mass or obstructive uropathy. Bilateral pleural effusions and significant ascites.   Original Report Authenticated By: Lollie Marrow, M.D.    Dg Chest Port 1 View  01/08/2012  *RADIOLOGY REPORT*  Clinical Data: Evaluate pulmonary edema, shortness of breath, ascites  PORTABLE CHEST - 1 VIEW  Comparison: 01/07/2012; 01/06/2012; 01/05/2012  Findings: Grossly unchanged enlarged cardiac silhouette and mediastinal contours given patient rotation to the left. Stable position of support apparatus.  Likely unchanged layering bilateral pleural  effusions given the patient's more upright  positioning. Bibasilar heterogeneous / consolidative opacities persist, left greater than right.  Increased conspicuity the pulmonary vasculature.  No pneumothorax.  Unchanged bones including multiple old left sided posterior lateral rib fractures.  IMPRESSION:  Findings suggestive of improved pulmonary edema with persistent small to moderate sized layering bilateral effusions and bibasilar opacities, atelectasis versus infiltrate.   Original Report Authenticated By: Waynard Reeds, M.D.    Dg Chest Port 1 View  01/07/2012  *RADIOLOGY REPORT*  Clinical Data: Evaluate pleural effusions  PORTABLE CHEST - 1 VIEW  Comparison: 01/06/2012  Findings: There is a right IJ catheter with tip in the cavoatrial junction.  The heart size appears normal.  Bilateral pleural effusions and interstitial edema is noted.  This is unchanged from previous exam.  Atelectasis is identified in the right base.  There are chronic left posterior lateral rib fracture deformities.  IMPRESSION:  1.  CHF.  Unchanged from previous exam.   Original Report Authenticated By: Rosealee Albee, M.D.    Dg Chest Port 1 View  01/06/2012  *RADIOLOGY REPORT*  Clinical Data: Extubation.  Follow-up effusions.  PORTABLE CHEST - 1 VIEW  Comparison: Portable chest x-rays yesterday and dating back to 01/02/2012.  Findings: Interval extubation and nasogastric tube removal.  Right jugular central venous catheter tip projects over the SVC.  Cardiac silhouette upper normal in size to slightly enlarged but stable. Pulmonary venous hypertension with mild interstitial pulmonary edema, unchanged.  Moderately large bilateral pleural effusions, unchanged, with associated dense consolidation in the lower lobes. No new abnormalities.  Multiple old healed left rib fractures with chest wall deformity.  IMPRESSION: Stable mild CHF and/or fluid overload.  Stable moderately large bilateral pleural effusions and associated passive atelectasis and/or pneumonia in the lower  lobes.  No new abnormalities.   Original Report Authenticated By: Arnell Sieving, M.D.    Dg Chest Port 1 View  01/05/2012  *RADIOLOGY REPORT*  Clinical Data: Post paracentesis  PORTABLE CHEST - 1 VIEW  Comparison: 01/04/2012  Findings: Endotracheal tube tip projects 2.5 cm proximal to the carina.  Right IJ catheter tip projects over the proximal SVC. Bilateral pleural effusions with associated consolidations.  No definite pneumothorax.  Cardiomegaly.  Central vascular congestion. Perihilar opacities.  Sequelae of prior posterior left rib fractures.  No interval osseous change.  IMPRESSION: Cardiomegaly with pulmonary edema pattern.  Bilateral pleural effusions and associated consolidations; atelectasis versus pneumonia.  Appropriately positioned support devices.   Original Report Authenticated By: Waneta Martins, M.D.    Dg Chest Port 1 View  01/04/2012  *RADIOLOGY REPORT*  Clinical Data: Post adjustment of endotracheal tube  PORTABLE CHEST - 1 VIEW  Comparison: Portable exam 1250 hours compared to 0449 hours  Findings: Tip of endotracheal tube remains within 8 mm of the carina directed towards the right main stem bronchus. Nasogastric tube extends into stomach. Right jugular central venous catheter tip projecting over SVC. Severely rotated to the left. Stable heart size. Persistent dense atelectasis or consolidation of left lower lobe with persistent right basilar atelectasis. Cannot exclude small layered pleural effusions. No pneumothorax. Old healed left rib fractures.  IMPRESSION: Tip of endotracheal tube remains 8 mm of carina directed towards right mainstem bronchus, recommend withdrawal 2 cm. Otherwise little change.  Findings called to Norwood Levo RN on 2100 Unit on 1432 hours 01/04/2012 at 1432 hours.   Original Report Authenticated By: Lollie Marrow, M.D.    Dg Chest Port 1 View  01/04/2012  *  RADIOLOGY REPORT*  Clinical Data: Assess ET tube  PORTABLE CHEST - 1 VIEW  Comparison: 01/02/2012   Findings: Endotracheal tube tip is at the carina, directed toward the right main bronchus.  NG tube descends below the level of the image.  Bilateral pleural effusions and associated consolidations. No definite pneumothorax.  No interval osseous change with multiple posterior left rib fractures that are favored remote. Cardiomediastinal contours are partially obscured by the effusions. May be mild cardiac enlargement and central vascular congestion. Edema suggested though mildly decreased. Right IJ catheter tip projects over the distal SVC.  IMPRESSION: Endotracheal tube tip at the carina, directed toward the right main bronchus, recommend 2-3 cm of retraction.  Prominent cardiac contour with central vascular congestion and mild improvement in the edema pattern.  Bilateral pleural effusions and associate consolidations; atelectasis versus pneumonia, similar to slightly increased.   Original Report Authenticated By: Waneta Martins, M.D.    Dg Chest Port 1 View  01/03/2012  *RADIOLOGY REPORT*  Clinical Data: Status post intubation.  PORTABLE CHEST - 1 VIEW  Comparison: Chest 01/02/2012 at 2044 hours.  Findings: New endotracheal tube is in place with tip 1.7 cm above the carina.  New NG tube is in good position with the tip in the fundus of the stomach.  Right IJ catheter is again noted. Bilateral airspace disease has progressed.  Heart size is upper normal.  No pneumothorax.  IMPRESSION:  1.  ET tube and NG tube in good position. 2.  Increased airspace disease likely due to pulmonary edema.   Original Report Authenticated By: Bernadene Bell. D'ALESSIO, M.D.    Dg Chest Portable 1 View  01/02/2012  *RADIOLOGY REPORT*  Clinical Data: Pain.  Right central line placement.  Abdominal distention.  Liver failure.  Ascites.  PORTABLE CHEST - 1 VIEW  Comparison: 01/02/2012  Findings: Right jugular vein catheter has been inserted and the tip is at the cavoatrial junction in good position.  The patient has new bilateral  pulmonary edema.  IMPRESSION: New bilateral pulmonary edema.  Central line tip appears in good position.   Original Report Authenticated By: Gwynn Burly, M.D.    Dg Chest Portable 1 View  01/02/2012  *RADIOLOGY REPORT*  Clinical Data: Fall  PORTABLE CHEST - 1 VIEW  Comparison: 07/26/2011  Findings: Hypoventilation with decreased lung volume and bibasilar mild atelectasis.  Negative for heart failure.  Chronic left rib fractures are noted and unchanged.  No definite acute fracture.  IMPRESSION: Hypoventilation with mild bibasilar atelectasis.   Original Report Authenticated By: Camelia Phenes, M.D.    Dg Foot 2 Views Left  01/04/2012  *RADIOLOGY REPORT*  Clinical Data: Left foot swelling, on ventilator, question soft tissue gas  LEFT FOOT - 2 VIEW  Comparison: Portable exam 1253 hours without priors for comparison  Findings: Prior amputation of the second toe and third ray beyond the distal metatarsal. Screws present at second and fourth metatarsal heads. Diffuse osseous demineralization. Soft tissue lucency identified at great toe deep to proximal phalanx question ulcer. Probable prior bunion surgery distal first metatarsal. No definite fracture, dislocation or bone destruction. Mild degenerative changes at first TMT joint. No other soft tissue gas identified.  IMPRESSION: Prior amputations at the second and third toes. Soft tissue lucency identified at the plantar aspect of the foot at the level of the proximal phalanx of the great toe, question large ulcer, recommend clinical correlation. No definite radiographic evidence of osteomyelitis.   Original Report Authenticated By: Lollie Marrow,  M.D.    Dg Foot 2 Views Left  01/03/2012  Nelda Bucks, MD     01/03/2012 11:20 AM Paracentesis Procedure Note  Pre-operative Diagnosis: Acholic Cirrhosis with septic shock   Post-operative Diagnosis: same  Indications: Peritoneal fluid collection  Procedure Details  Consent: Informed consent was obtained from  the husband. Risks of  the procedure were discussed including: infection, bleeding,  pain, bowel perferation.  Under sterile conditions the patient was positioned.  Fluid  collection was visualized under real time ultrasound. CHG  solution and sterile drapes were utilized.  1% plain lidocaine  was used to anesthetize the left lower quadrant of the abdomen.  Fluid was obtained without any difficulties and no blood loss.  A  dressing was applied to the wound and wound care instructions  were provided.   Findings 60 ml of clear yellow peritoneal fluid was obtained. A sample was  sent to Pathology for cell counts, culture and gram stain, and  LDH  Complications:  None; patient tolerated the procedure well.          Condition: stable  Plan Follow cultures  Attending Attestation: I was present for the entire procedure.  Richard A. Rosebrock, NP Student  Tolerate d well US guidance  Mcarthur Rossetti. Tyson Alias, MD, FACP Pgr: [phone] [facility]          Patient is not lightheaded on standing. TMs are without difficulty. She a full meal in the emergency department  MDM  Case discussed with Dr. Dulce Sellar emergent paracentesis none indicated, Dr. Dulce Sellar states the patient almost died after last paracentesis and he would prefer paracentesis not be done today if not absolutely indicated. Patient exhibits no evidence of encephalopathy dyspnea or lightheadedness on standing.  plan antibiotic ointment placed on foot ulcer and ulcer was re\re bandaged Patient is to call Dr. Dulce Sellar 01/30/2012 for followup Dx #1 Ascites #2 foot ulcer #3 anemia       Doug Sou, MD 01/27/12 1849  Doug Sou, MD 01/28/12 0004

## 2012-02-01 ENCOUNTER — Telehealth: Payer: Self-pay | Admitting: Internal Medicine

## 2012-02-01 NOTE — Telephone Encounter (Signed)
Please advise 

## 2012-02-01 NOTE — Telephone Encounter (Signed)
The home health nurse is calling to let you know she will be seeing her twice a week for about 3 weeks.   She is doing wound care on her foot.  She said she had spoken to her sister and it was the sister's impression she would be going to a rehab for a few weeks.  The nurse said she got up and ambulated across the room with her walker without any assistance and she gets to the bathroom and kitchen ok with walker.  She said the social worker was just leaving as she got there so she doesn't know what her evaluation was.

## 2012-02-02 NOTE — Telephone Encounter (Signed)
I don't know exactly what I had been asked. I haven't seen the patient in almost 2 years, she has been in the hospital few times. Schedule a office visit, if she is unable to come, she needs to get a doctor who does home visits.

## 2012-02-02 NOTE — Telephone Encounter (Signed)
lmovm for pt to return call.  

## 2012-02-02 NOTE — Telephone Encounter (Signed)
Discussed with kay.

## 2012-02-15 ENCOUNTER — Encounter (HOSPITAL_COMMUNITY): Payer: Self-pay | Admitting: *Deleted

## 2012-02-16 ENCOUNTER — Inpatient Hospital Stay (HOSPITAL_COMMUNITY)
Admission: RE | Admit: 2012-02-16 | Discharge: 2012-02-20 | DRG: 475 | Disposition: A | Discharge status: Skilled Nursing Facility | Payer: Medicare Other | Source: Ambulatory Visit | Attending: Orthopedic Surgery | Admitting: Orthopedic Surgery

## 2012-02-16 ENCOUNTER — Encounter (HOSPITAL_COMMUNITY)
Admission: RE | Disposition: A | Discharge status: Skilled Nursing Facility | Payer: Self-pay | Source: Ambulatory Visit | Attending: Orthopedic Surgery

## 2012-02-16 ENCOUNTER — Encounter (HOSPITAL_COMMUNITY): Payer: Self-pay | Admitting: Anesthesiology

## 2012-02-16 ENCOUNTER — Encounter (HOSPITAL_COMMUNITY): Payer: Self-pay | Admitting: *Deleted

## 2012-02-16 ENCOUNTER — Inpatient Hospital Stay (HOSPITAL_COMMUNITY): Payer: Medicare Other | Admitting: Anesthesiology

## 2012-02-16 DIAGNOSIS — Z79899 Other long term (current) drug therapy: Secondary | ICD-10-CM

## 2012-02-16 DIAGNOSIS — M810 Age-related osteoporosis without current pathological fracture: Secondary | ICD-10-CM | POA: Diagnosis present

## 2012-02-16 DIAGNOSIS — M86179 Other acute osteomyelitis, unspecified ankle and foot: Secondary | ICD-10-CM

## 2012-02-16 DIAGNOSIS — L02619 Cutaneous abscess of unspecified foot: Secondary | ICD-10-CM | POA: Diagnosis present

## 2012-02-16 DIAGNOSIS — I10 Essential (primary) hypertension: Secondary | ICD-10-CM | POA: Diagnosis present

## 2012-02-16 DIAGNOSIS — M86679 Other chronic osteomyelitis, unspecified ankle and foot: Principal | ICD-10-CM | POA: Diagnosis present

## 2012-02-16 DIAGNOSIS — S98139A Complete traumatic amputation of one unspecified lesser toe, initial encounter: Secondary | ICD-10-CM

## 2012-02-16 HISTORY — DX: Gastro-esophageal reflux disease without esophagitis: K21.9

## 2012-02-16 HISTORY — PX: TRANSMETATARSAL AMPUTATION: SHX6197

## 2012-02-16 HISTORY — DX: Unspecified staphylococcus as the cause of diseases classified elsewhere: B95.8

## 2012-02-16 LAB — BASIC METABOLIC PANEL
CO2: 24 mEq/L (ref 19–32)
Chloride: 96 mEq/L (ref 96–112)
Glucose, Bld: 81 mg/dL (ref 70–99)
Potassium: 3.6 mEq/L (ref 3.5–5.1)
Sodium: 134 mEq/L — ABNORMAL LOW (ref 135–145)

## 2012-02-16 LAB — CBC
HCT: 27.6 % — ABNORMAL LOW (ref 36.0–46.0)
Hemoglobin: 9.2 g/dL — ABNORMAL LOW (ref 12.0–15.0)
MCH: 31.2 pg (ref 26.0–34.0)
MCV: 93.6 fL (ref 78.0–100.0)
RBC: 2.95 MIL/uL — ABNORMAL LOW (ref 3.87–5.11)

## 2012-02-16 LAB — SURGICAL PCR SCREEN: MRSA, PCR: NEGATIVE

## 2012-02-16 SURGERY — AMPUTATION, FOOT, TRANSMETATARSAL
Anesthesia: General | Site: Foot | Laterality: Left | Wound class: Dirty or Infected

## 2012-02-16 MED ORDER — SODIUM CHLORIDE 0.9 % IV SOLN: INTRAVENOUS | Status: DC

## 2012-02-16 MED ORDER — PANTOPRAZOLE SODIUM 40 MG PO TBEC
40.0000 mg | DELAYED_RELEASE_TABLET | Freq: Two times a day (BID) | ORAL | Status: DC
  Administered 2012-02-17 – 2012-02-20 (×7): 40 mg via ORAL
  Filled 2012-02-16 (×8): qty 1

## 2012-02-16 MED ORDER — WARFARIN - PHARMACIST DOSING INPATIENT: Freq: Every day | Status: DC

## 2012-02-16 MED ORDER — HYDROMORPHONE HCL PF 1 MG/ML IJ SOLN
0.2500 mg | INTRAMUSCULAR | Status: DC | PRN
  Administered 2012-02-16 (×2): 0.5 mg via INTRAVENOUS

## 2012-02-16 MED ORDER — FENTANYL CITRATE 0.05 MG/ML IJ SOLN
INTRAMUSCULAR | Status: DC | PRN
  Administered 2012-02-16: 50 ug via INTRAVENOUS

## 2012-02-16 MED ORDER — ONDANSETRON HCL 4 MG PO TABS
4.0000 mg | ORAL_TABLET | Freq: Four times a day (QID) | ORAL | Status: DC | PRN
  Administered 2012-02-19 – 2012-02-20 (×2): 4 mg via ORAL
  Filled 2012-02-16 (×2): qty 1

## 2012-02-16 MED ORDER — VITAMIN B-1 100 MG PO TABS
100.0000 mg | ORAL_TABLET | Freq: Every day | ORAL | Status: DC
  Administered 2012-02-16 – 2012-02-20 (×5): 100 mg via ORAL
  Filled 2012-02-16 (×5): qty 1

## 2012-02-16 MED ORDER — LACTATED RINGERS IV SOLN
INTRAVENOUS | Status: DC | PRN
  Administered 2012-02-16: 17:00:00 via INTRAVENOUS

## 2012-02-16 MED ORDER — HYDROCODONE-ACETAMINOPHEN 5-325 MG PO TABS
1.0000 | ORAL_TABLET | ORAL | Status: DC | PRN
  Administered 2012-02-16 – 2012-02-18 (×8): 2 via ORAL
  Filled 2012-02-16 (×8): qty 2

## 2012-02-16 MED ORDER — CEFAZOLIN SODIUM 1-5 GM-% IV SOLN
INTRAVENOUS | Status: AC
  Filled 2012-02-16: qty 50

## 2012-02-16 MED ORDER — LIDOCAINE HCL (CARDIAC) 20 MG/ML IV SOLN
INTRAVENOUS | Status: DC | PRN
  Administered 2012-02-16: 30 mg via INTRAVENOUS

## 2012-02-16 MED ORDER — HYDROMORPHONE HCL PF 1 MG/ML IJ SOLN
INTRAMUSCULAR | Status: AC
  Filled 2012-02-16: qty 1

## 2012-02-16 MED ORDER — FOLIC ACID 1 MG PO TABS
1.0000 mg | ORAL_TABLET | Freq: Every day | ORAL | Status: DC
  Administered 2012-02-16 – 2012-02-20 (×5): 1 mg via ORAL
  Filled 2012-02-16 (×5): qty 1

## 2012-02-16 MED ORDER — SPIRONOLACTONE 50 MG PO TABS
50.0000 mg | ORAL_TABLET | Freq: Every day | ORAL | Status: DC
  Administered 2012-02-17 – 2012-02-18 (×2): 50 mg via ORAL
  Filled 2012-02-16 (×3): qty 1

## 2012-02-16 MED ORDER — METOCLOPRAMIDE HCL 5 MG/ML IJ SOLN: 5.0000 mg | Freq: Three times a day (TID) | INTRAMUSCULAR | Status: DC | PRN

## 2012-02-16 MED ORDER — CEFAZOLIN SODIUM 1-5 GM-% IV SOLN
1.0000 g | Freq: Four times a day (QID) | INTRAVENOUS | Status: AC
  Administered 2012-02-17 (×3): 1 g via INTRAVENOUS
  Filled 2012-02-16 (×3): qty 50

## 2012-02-16 MED ORDER — 0.9 % SODIUM CHLORIDE (POUR BTL) OPTIME
TOPICAL | Status: DC | PRN
  Administered 2012-02-16: 1000 mL

## 2012-02-16 MED ORDER — METOCLOPRAMIDE HCL 10 MG PO TABS: 5.0000 mg | ORAL_TABLET | Freq: Three times a day (TID) | ORAL | Status: DC | PRN

## 2012-02-16 MED ORDER — SUCCINYLCHOLINE CHLORIDE 20 MG/ML IJ SOLN
INTRAMUSCULAR | Status: DC | PRN
  Administered 2012-02-16: 60 mg via INTRAVENOUS

## 2012-02-16 MED ORDER — WHITE PETROLATUM GEL
Status: AC
  Administered 2012-02-16: 20:00:00
  Filled 2012-02-16: qty 5

## 2012-02-16 MED ORDER — PROPRANOLOL HCL 20 MG/5ML PO SOLN
20.0000 mg | Freq: Every morning | ORAL | Status: DC
  Administered 2012-02-17 – 2012-02-20 (×4): 20 mg via ORAL
  Filled 2012-02-16 (×4): qty 5

## 2012-02-16 MED ORDER — MENTHOL 3 MG MT LOZG
1.0000 | LOZENGE | OROMUCOSAL | Status: DC | PRN
Start: 2012-02-16 — End: 2012-02-20
  Filled 2012-02-16: qty 9

## 2012-02-16 MED ORDER — ONDANSETRON HCL 4 MG/2ML IJ SOLN: 4.0000 mg | Freq: Once | INTRAMUSCULAR | Status: DC | PRN

## 2012-02-16 MED ORDER — BOOST / RESOURCE BREEZE PO LIQD
1.0000 | Freq: Every day | ORAL | Status: DC
  Administered 2012-02-17 – 2012-02-20 (×4): 1 via ORAL
  Filled 2012-02-16: qty 2

## 2012-02-16 MED ORDER — ONDANSETRON HCL 4 MG/2ML IJ SOLN: 4.0000 mg | Freq: Four times a day (QID) | INTRAMUSCULAR | Status: DC | PRN

## 2012-02-16 MED ORDER — MUPIROCIN 2 % EX OINT
TOPICAL_OINTMENT | CUTANEOUS | Status: AC
  Administered 2012-02-16: 1 via NASAL
  Filled 2012-02-16: qty 22

## 2012-02-16 MED ORDER — FUROSEMIDE 40 MG PO TABS
40.0000 mg | ORAL_TABLET | Freq: Two times a day (BID) | ORAL | Status: DC
  Administered 2012-02-16 – 2012-02-18 (×5): 40 mg via ORAL
  Filled 2012-02-16 (×12): qty 1

## 2012-02-16 MED ORDER — PROPOFOL 10 MG/ML IV BOLUS
INTRAVENOUS | Status: DC | PRN
  Administered 2012-02-16: 60 mg via INTRAVENOUS

## 2012-02-16 MED ORDER — CEFAZOLIN SODIUM 1-5 GM-% IV SOLN
INTRAVENOUS | Status: DC | PRN
  Administered 2012-02-16: 1 g via INTRAVENOUS

## 2012-02-16 MED ORDER — ONDANSETRON HCL 4 MG/2ML IJ SOLN: INTRAMUSCULAR | Status: DC | PRN

## 2012-02-16 MED ORDER — WARFARIN SODIUM 2.5 MG PO TABS
2.5000 mg | ORAL_TABLET | ORAL | Status: AC
  Administered 2012-02-17: 2.5 mg via ORAL
  Filled 2012-02-16 (×2): qty 1

## 2012-02-16 MED ORDER — EPHEDRINE SULFATE 50 MG/ML IJ SOLN
INTRAMUSCULAR | Status: DC | PRN
  Administered 2012-02-16 (×2): 10 mg via INTRAVENOUS

## 2012-02-16 SURGICAL SUPPLY — 39 items
BANDAGE GAUZE ELAST BULKY 4 IN (GAUZE/BANDAGES/DRESSINGS) ×2 IMPLANT
BLADE SAW SGTL HD 18.5X60.5X1. (BLADE) ×2 IMPLANT
BLADE SURG 10 STRL SS (BLADE) IMPLANT
BNDG COHESIVE 4X5 TAN STRL (GAUZE/BANDAGES/DRESSINGS) ×2 IMPLANT
BNDG ESMARK 4X9 LF (GAUZE/BANDAGES/DRESSINGS) ×2 IMPLANT
CLOTH BEACON ORANGE TIMEOUT ST (SAFETY) ×2 IMPLANT
COVER SURGICAL LIGHT HANDLE (MISCELLANEOUS) ×2 IMPLANT
CUFF TOURNIQUET SINGLE 34IN LL (TOURNIQUET CUFF) IMPLANT
CUFF TOURNIQUET SINGLE 44IN (TOURNIQUET CUFF) IMPLANT
DRAPE U-SHAPE 47X51 STRL (DRAPES) ×2 IMPLANT
DRSG ADAPTIC 3X8 NADH LF (GAUZE/BANDAGES/DRESSINGS) ×2 IMPLANT
DRSG PAD ABDOMINAL 8X10 ST (GAUZE/BANDAGES/DRESSINGS) ×2 IMPLANT
DURAPREP 26ML APPLICATOR (WOUND CARE) ×2 IMPLANT
ELECT REM PT RETURN 9FT ADLT (ELECTROSURGICAL) ×2
ELECTRODE REM PT RTRN 9FT ADLT (ELECTROSURGICAL) ×1 IMPLANT
GLOVE BIOGEL PI IND STRL 9 (GLOVE) ×1 IMPLANT
GLOVE BIOGEL PI INDICATOR 9 (GLOVE) ×1
GLOVE SURG ORTHO 9.0 STRL STRW (GLOVE) ×2 IMPLANT
GOWN PREVENTION PLUS XLARGE (GOWN DISPOSABLE) ×2 IMPLANT
GOWN SRG XL XLNG 56XLVL 4 (GOWN DISPOSABLE) ×2 IMPLANT
GOWN STRL NON-REIN XL XLG LVL4 (GOWN DISPOSABLE) ×2
KIT BASIN OR (CUSTOM PROCEDURE TRAY) ×2 IMPLANT
KIT ROOM TURNOVER OR (KITS) ×2 IMPLANT
MANIFOLD NEPTUNE II (INSTRUMENTS) ×2 IMPLANT
NS IRRIG 1000ML POUR BTL (IV SOLUTION) ×2 IMPLANT
PACK ORTHO EXTREMITY (CUSTOM PROCEDURE TRAY) ×2 IMPLANT
PAD ARMBOARD 7.5X6 YLW CONV (MISCELLANEOUS) ×4 IMPLANT
PAD CAST 4YDX4 CTTN HI CHSV (CAST SUPPLIES) ×1 IMPLANT
PADDING CAST COTTON 4X4 STRL (CAST SUPPLIES) ×1
SPONGE GAUZE 4X4 12PLY (GAUZE/BANDAGES/DRESSINGS) ×2 IMPLANT
SPONGE LAP 18X18 X RAY DECT (DISPOSABLE) ×2 IMPLANT
STAPLER VISISTAT 35W (STAPLE) ×2 IMPLANT
SUT ETHILON 2 0 PSLX (SUTURE) ×6 IMPLANT
SUT VIC AB 2-0 CTB1 (SUTURE) ×4 IMPLANT
TOWEL OR 17X24 6PK STRL BLUE (TOWEL DISPOSABLE) ×2 IMPLANT
TOWEL OR 17X26 10 PK STRL BLUE (TOWEL DISPOSABLE) ×2 IMPLANT
TUBE CONNECTING 12X1/4 (SUCTIONS) ×2 IMPLANT
WATER STERILE IRR 1000ML POUR (IV SOLUTION) ×2 IMPLANT
YANKAUER SUCT BULB TIP NO VENT (SUCTIONS) ×2 IMPLANT

## 2012-02-16 NOTE — Progress Notes (Signed)
Pt took her 1500 meds as prescribed with a sip of water.

## 2012-02-16 NOTE — Op Note (Signed)
OPERATIVE REPORT  DATE OF SURGERY: 02/16/2012  PATIENT:  Kathryn [name],  61 y.o. female  PRE-OPERATIVE DIAGNOSIS:  osteomyelitis and abscess left forefoot  POST-OPERATIVE DIAGNOSIS:  osteomyelitis and abscess left forefoot  PROCEDURE:  Procedure(s): TRANSMETATARSAL AMPUTATION  SURGEON:  Surgeon(s): Nadara Mustard, MD  ANESTHESIA:   general  EBL:  Minimal ML  SPECIMEN:  Source of Specimen:  Left forefoot  TOURNIQUET:  * No tourniquets in log *  PROCEDURE DETAILS: Patient is a 61 year old woman with severe peripheral vascular disease with osteomyelitis of the second metatarsal abscess of the forefoot ulceration cellulitis has failed conservative care and presents at this time for foot salvage surgery with midfoot amputation. Risks and benefits were discussed including infection neurovascular injury nonhealing of the wound need for higher level amputation. Patient states she understands was to proceed at this time. Description of procedure patient was brought to the operating room underwent a general anesthetic. After adequate levels of anesthesia were obtained patient's left lower extremity was prepped using DuraPrep and draped into a sterile field. A fishmouth incision was made through the midfoot. This was carried down and a transmetatarsal amputation was performed using a reciprocating saw. The wound was irrigated with normal saline hemostasis was obtained. The skin was closing 2-0 nylon was no tension the skin. The wound was covered with Adaptic orthopedic sponges AB dressing Kerlix and Coban. Patient was extubated taken to the PACU in stable condition patient will need discharge to skilled nursing.  PLAN OF CARE: Admit to inpatient   PATIENT DISPOSITION:  PACU - hemodynamically stable.   Nadara Mustard, MD 02/16/2012 5:49 PM

## 2012-02-16 NOTE — H&P (Signed)
Kathryn [name] is an 60 y.o. female.   Chief Complaint: Chronic ulceration abscess and osteomyelitis left forefoot HPI: Patient is a 61 year old woman status post previous foot salvage surgeries who presents at this time after prolonged conservative therapy for foot salvage. Patient has a chronic draining ulcer with osteomyelitis of the second metatarsal and presents at this time for foot salvage surgical treatment.  Past Medical History  Diagnosis Date  . Hypertension   . Osteoporosis   . Alcoholic liver disease   . High cholesterol   . Peripheral vascular disease   . Shortness of breath 09/22/11    "because of all the fluid that's built up in my stomach"  . Ascites 09/22/11    "first time for me"  . Anemia   . History of blood transfusion 07/2011    "when I had elbow surgery"  . Arthritis     "all over"  . Dysrhythmia     "irregular"  . GERD (gastroesophageal reflux disease)   . Staph infection     left foot    Past Surgical History  Procedure Date  . Fracture surgery   . Amputation     two toes on left foot  . Bunionectomy     both feet  . Arthroscopic repair acl     right knee  . Joint replacement     right hip   . Orif humerus fracture 07/26/2011    Procedure: OPEN REDUCTION INTERNAL FIXATION (ORIF) DISTAL HUMERUS FRACTURE;  Surgeon: Budd Palmer, MD;  Location: [facility] OR;  Service: Orthopedics;  Laterality: Right;  . Total hip arthroplasty ~ 2003    right  . Tubal ligation 1980  . Paracentesis 09/22/11    2.2L  . Esophagogastroduodenoscopy 09/23/2011    Procedure: ESOPHAGOGASTRODUODENOSCOPY (EGD);  Surgeon: Shirley Friar, MD;  Location: [facility] ENDOSCOPY;  Service: Endoscopy;  Laterality: N/A;    History reviewed. No pertinent family history. Social History:  reports that she has never smoked. She has never used smokeless tobacco. She reports that she does not drink alcohol or use illicit drugs.  Allergies: No Known Allergies  Medications Prior to Admission  Medication  Sig Dispense Refill  . CALCIUM-VITAMIN D PO Take 1 tablet by mouth 2 (two) times daily.      . clindamycin (CLEOCIN) 150 MG capsule Take 300 mg by mouth 4 (four) times daily.      . feeding supplement (RESOURCE BREEZE) LIQD Take 1-2 Containers by mouth daily. Drinks at least one can a day and sometimes 2 cans      . folic acid (FOLVITE) 1 MG tablet Take 1 tablet (1 mg total) by mouth daily.  30 tablet  0  . furosemide (LASIX) 40 MG tablet Take 1 tablet (40 mg total) by mouth 2 (two) times daily.  60 tablet  2  . HYDROcodone-acetaminophen (VICODIN) 5-500 MG per tablet Take 1 tablet by mouth every 8 (eight) hours as needed. For pain      . OVER THE COUNTER MEDICATION Take 1 tablet by mouth at bedtime as needed. For sleep. Equate sleep aid      . pantoprazole (PROTONIX) 40 MG tablet Take 1 tablet (40 mg total) by mouth 2 (two) times daily before a meal.  60 tablet  2  . propranolol (INDERAL) 20 MG/5ML solution Take 5 mLs (20 mg total) by mouth every morning.  500 mL  12  . spironolactone (ALDACTONE) 50 MG tablet Take 1 tablet (50 mg total) by mouth daily.  30 tablet  2  . thiamine 100 MG tablet Take 1 tablet (100 mg total) by mouth daily.  30 tablet  0  . lactulose (CHRONULAC) 10 GM/15ML solution Take 30 mLs (20 g total) by mouth 2 (two) times daily.  960 mL  0  . OVER THE COUNTER MEDICATION Take 1 tablet by mouth at bedtime as needed. Equate sleep aid for sleep        Results for orders placed during the hospital encounter of 02/16/12 (from the past 48 hour(s))  BASIC METABOLIC PANEL     Status: Abnormal   Collection Time   02/16/12  2:32 PM      Component Value Range Comment   Sodium 134 (*) 135 - 145 mEq/L    Potassium 3.6  3.5 - 5.1 mEq/L    Chloride 96  96 - 112 mEq/L    CO2 24  19 - 32 mEq/L    Glucose, Bld 81  70 - 99 mg/dL    BUN 20  6 - 23 mg/dL    Creatinine, Ser 5.78 (*) 0.50 - 1.10 mg/dL    Calcium 8.8  8.4 - 46.9 mg/dL    GFR calc non Af Amer 48 (*) >90 mL/min    GFR calc Af  Amer 56 (*) >90 mL/min   CBC     Status: Abnormal   Collection Time   02/16/12  2:32 PM      Component Value Range Comment   WBC 8.2  4.0 - 10.5 K/uL    RBC 2.95 (*) 3.87 - 5.11 MIL/uL    Hemoglobin 9.2 (*) 12.0 - 15.0 g/dL    HCT 62.9 (*) 52.8 - 46.0 %    MCV 93.6  78.0 - 100.0 fL    MCH 31.2  26.0 - 34.0 pg    MCHC 33.3  30.0 - 36.0 g/dL    RDW 41.3  24.4 - 01.0 %    Platelets 351  150 - 400 K/uL   SURGICAL PCR SCREEN     Status: Normal   Collection Time   02/16/12  2:32 PM      Component Value Range Comment   MRSA, PCR NEGATIVE  NEGATIVE    Staphylococcus aureus NEGATIVE  NEGATIVE    No results found.  Review of Systems  All other systems reviewed and are negative.    Height 5\' 1"  (1.549 m), weight 47.174 kg (104 lb). Physical Exam  On examination patient has a palpable dorsalis pedis pulse. She has an ulcer beneath the second metatarsal head which probes to bone there is purulent drainage radiograph shows osteomyelitis.  Assessment/Plan Assessment osteomyelitis abscess ulceration left forefoot. Plan: Will plan for a midfoot amputation. Risks and benefits were discussed including persistent infection nonhealing of the wound need for additional surgery. Patient states she understands was to proceed at this time we'll plan for discharge to skilled nursing once she is stable postoperatively.Aldean Baker V 02/16/2012, 4:57 PM

## 2012-02-16 NOTE — Anesthesia Preprocedure Evaluation (Addendum)
Anesthesia Evaluation  Patient identified by MRN, date of birth, ID band Patient awake    Reviewed: Allergy & Precautions, H&P , Patient's Chart, lab work & pertinent test results  Airway Mallampati: I TM Distance: >3 FB Neck ROM: Full    Dental  (+) Partial Upper,    Pulmonary shortness of breath, COPD   Pulmonary exam normal       Cardiovascular hypertension, + CAD and + Past MI Rhythm:Regular Rate:Normal     Neuro/Psych  Neuromuscular disease    GI/Hepatic GERD-  ,(+)     substance abuse  alcohol use,   Endo/Other    Renal/GU Renal disease     Musculoskeletal   Abdominal Normal abdominal exam  (+)   Peds  Hematology   Anesthesia Other Findings   Reproductive/Obstetrics                         Anesthesia Physical Anesthesia Plan  ASA: IV  Anesthesia Plan: General   Post-op Pain Management:    Induction: Intravenous  Airway Management Planned: Oral ETT  Additional Equipment:   Intra-op Plan:   Post-operative Plan: Extubation in OR  Informed Consent: I have reviewed the patients History and Physical, chart, labs and discussed the procedure including the risks, benefits and alternatives for the proposed anesthesia with the patient or authorized representative who has indicated his/her understanding and acceptance.     Plan Discussed with: CRNA, Anesthesiologist and Surgeon  Anesthesia Plan Comments: (Pt refuses regional block.)        Anesthesia Quick Evaluation

## 2012-02-16 NOTE — Preoperative (Signed)
Beta Blockers   Reason not to administer Beta Blockers:Not Applicable 

## 2012-02-16 NOTE — Anesthesia Postprocedure Evaluation (Signed)
  Anesthesia Post-op Note  Patient: Kathryn [name]  Procedure(s) Performed: Procedure(s) (LRB) with comments: TRANSMETATARSAL AMPUTATION (Left)  Patient Location: PACU  Anesthesia Type:General  Level of Consciousness: awake  Airway and Oxygen Therapy: Patient Spontanous Breathing  Post-op Pain: mild  Post-op Assessment: Post-op Vital signs reviewed  Post-op Vital Signs: stable  Complications: No apparent anesthesia complications

## 2012-02-16 NOTE — Transfer of Care (Signed)
Immediate Anesthesia Transfer of Care Note  Patient: Kathryn [name]  Procedure(s) Performed: Procedure(s) (LRB) with comments: TRANSMETATARSAL AMPUTATION (Left)  Patient Location: PACU  Anesthesia Type:General  Level of Consciousness: awake and alert   Airway & Oxygen Therapy: Patient Spontanous Breathing  Post-op Assessment: Report given to PACU RN and Post -op Vital signs reviewed and stable  Post vital signs: Reviewed and stable  Complications: No apparent anesthesia complications

## 2012-02-16 NOTE — Consult Note (Signed)
ANTICOAGULATION CONSULT - COUMADIN  Pharmacy Consult for Coumadin  HPI: 61 y.o.female admitted for osteomyelitis and abscess left forefoot who has been started on Coumadin for VTE prophylaxis.   She is s/p transmetatarsal amputation [left foot].  Allergies: No Known Allergies  Height/Weight: Height: 5\' 1"  (154.9 cm) Weight: 104 lb (47.174 kg) IBW/kg (Calculated) : 47.8   Vitals: Blood pressure 90/62, pulse 89, temperature 97.6 F (36.4 C), temperature source Oral, resp. rate 18, height 5\' 1"  (1.549 m), weight 104 lb (47.174 kg), SpO2 100.00%.  Current active problems: Active Problems:  * No active hospital problems. *    Medical / Surgical History: Past Medical History  Diagnosis Date  . Hypertension   . Osteoporosis   . Alcoholic liver disease   . High cholesterol   . Peripheral vascular disease   . Shortness of breath 09/22/11    "because of all the fluid that's built up in my stomach"  . Ascites 09/22/11    "first time for me"  . Anemia   . History of blood transfusion 07/2011    "when I had elbow surgery"  . Arthritis     "all over"  . Dysrhythmia     "irregular"  . GERD (gastroesophageal reflux disease)   . Staph infection     left foot   Past Surgical History  Procedure Date  . Fracture surgery   . Amputation     two toes on left foot  . Bunionectomy     both feet  . Arthroscopic repair acl     right knee  . Joint replacement     right hip   . Orif humerus fracture 07/26/2011    Procedure: OPEN REDUCTION INTERNAL FIXATION (ORIF) DISTAL HUMERUS FRACTURE;  Surgeon: Budd Palmer, MD;  Location: [facility] OR;  Service: Orthopedics;  Laterality: Right;  . Total hip arthroplasty ~ 2003    right  . Tubal ligation 1980  . Paracentesis 09/22/11    2.2L  . Esophagogastroduodenoscopy 09/23/2011    Procedure: ESOPHAGOGASTRODUODENOSCOPY (EGD);  Surgeon: Shirley Friar, MD;  Location: [facility] ENDOSCOPY;  Service: Endoscopy;  Laterality: N/A;    Current  Labs:   Basename 02/16/12 1432  HGB 9.2*  HCT 27.6*  PLT 351  CREATININE 1.20*    Lab Results  Component Value Date   INR 1.17 01/27/2012   INR 1.35 01/05/2012   INR 1.19 01/02/2012   Estimated Creatinine Clearance: 37.6 ml/min (by C-G formula based on Cr of 1.2).  Pertinent Medication History: Prescriptions prior to admission  Medication Sig Dispense Refill  . CALCIUM-VITAMIN D PO Take 1 tablet by mouth 2 (two) times daily.      . clindamycin (CLEOCIN) 150 MG capsule Take 300 mg by mouth 4 (four) times daily.      . feeding supplement (RESOURCE BREEZE) LIQD Take 1-2 Containers by mouth daily. Drinks at least one can a day and sometimes 2 cans      . folic acid (FOLVITE) 1 MG tablet Take 1 tablet (1 mg total) by mouth daily.  30 tablet  0  . furosemide (LASIX) 40 MG tablet Take 1 tablet (40 mg total) by mouth 2 (two) times daily.  60 tablet  2  . HYDROcodone-acetaminophen (VICODIN) 5-500 MG per tablet Take 1 tablet by mouth every 8 (eight) hours as needed. For pain      . OVER THE COUNTER MEDICATION Take 1 tablet by mouth at bedtime as needed. For sleep. Equate sleep aid      .  pantoprazole (PROTONIX) 40 MG tablet Take 1 tablet (40 mg total) by mouth 2 (two) times daily before a meal.  60 tablet  2  . propranolol (INDERAL) 20 MG/5ML solution Take 5 mLs (20 mg total) by mouth every morning.  500 mL  12  . spironolactone (ALDACTONE) 50 MG tablet Take 1 tablet (50 mg total) by mouth daily.  30 tablet  2  . thiamine 100 MG tablet Take 1 tablet (100 mg total) by mouth daily.  30 tablet  0  . lactulose (CHRONULAC) 10 GM/15ML solution Take 30 mLs (20 g total) by mouth 2 (two) times daily.  960 mL  0  . OVER THE COUNTER MEDICATION Take 1 tablet by mouth at bedtime as needed. Equate sleep aid for sleep       Scheduled:    .  ceFAZolin (ANCEF) IV  1 g Intravenous Q6H  . feeding supplement  1-2 Container Oral Daily  . folic acid  1 mg Oral Daily  . furosemide  40 mg Oral BID  .  HYDROmorphone      . mupirocin ointment      . pantoprazole  40 mg Oral BID AC  . propranolol  20 mg Oral q morning - 10a  . spironolactone  50 mg Oral Daily  . thiamine  100 mg Oral Daily  . white petrolatum       Anti-infectives     Start     Dose/Rate Route Frequency Ordered Stop   02/16/12 2315   ceFAZolin (ANCEF) IVPB 1 g/50 mL premix        1 g 100 mL/hr over 30 Minutes Intravenous Every 6 hours 02/16/12 1905 02/17/12 1714          Assessment:  Pre-op INR 1.17.  Patient currently not on anticoagulation but does have a history of Alcoholic Liver Disease. Last GGT elevated 441, Total Protein 4.5, Albumin 2.1.  Coumadin Predictor score = 2  Patient does have following high risk factors:  Albumin < 2.5, Wt < 50 kg.  Will begin Coumadin with low doses and adjust slowly.  Goals:  Target INR of 2-3.  Plan:  Will start Coumadin tonight 2.5 mg x 1.    Daily INR's, CBC. Warf ED initiated  Laurena Bering, Pharm. D. 02/16/2012, 8:23 PM

## 2012-02-16 NOTE — Anesthesia Procedure Notes (Signed)
Procedure Name: Intubation Date/Time: 02/16/2012 5:23 PM Performed by: Gwenyth Allegra Pre-anesthesia Checklist: Patient identified, Timeout performed, Emergency Drugs available, Suction available and Patient being monitored Patient Re-evaluated:Patient Re-evaluated prior to inductionOxygen Delivery Method: Circle system utilized Preoxygenation: Pre-oxygenation with 100% oxygen Intubation Type: IV induction Laryngoscope Size: Mac and 3 Grade View: Grade I Tube type: Oral Tube size: 7.0 mm Number of attempts: 1 Airway Equipment and Method: Stylet Placement Confirmation: ETT inserted through vocal cords under direct vision,  breath sounds checked- equal and bilateral and positive ETCO2 Secured at: 21 cm Dental Injury: Teeth and Oropharynx as per pre-operative assessment

## 2012-02-17 ENCOUNTER — Encounter (HOSPITAL_COMMUNITY): Payer: Self-pay | Admitting: Orthopedic Surgery

## 2012-02-17 LAB — PROTIME-INR
INR: 1.31 (ref 0.00–1.49)
Prothrombin Time: 16 seconds — ABNORMAL HIGH (ref 11.6–15.2)

## 2012-02-17 LAB — CBC
MCHC: 33 g/dL (ref 30.0–36.0)
RDW: 15.3 % (ref 11.5–15.5)

## 2012-02-17 MED ORDER — INFLUENZA VIRUS VACC SPLIT PF IM SUSP
0.5000 mL | INTRAMUSCULAR | Status: AC
  Administered 2012-02-18: 0.5 mL via INTRAMUSCULAR
  Filled 2012-02-17: qty 0.5

## 2012-02-17 MED ORDER — WARFARIN SODIUM 2.5 MG PO TABS
2.5000 mg | ORAL_TABLET | Freq: Once | ORAL | Status: AC
  Administered 2012-02-17: 2.5 mg via ORAL
  Filled 2012-02-17: qty 1

## 2012-02-17 MED ORDER — COUMADIN BOOK
Freq: Once | Status: AC
  Administered 2012-02-17: 14:00:00
  Filled 2012-02-17: qty 1

## 2012-02-17 MED ORDER — HYDROMORPHONE HCL PF 1 MG/ML IJ SOLN
1.0000 mg | INTRAMUSCULAR | Status: DC | PRN
Start: 2012-02-17 — End: 2012-02-19
  Administered 2012-02-17 – 2012-02-18 (×6): 1 mg via INTRAVENOUS
  Filled 2012-02-17 (×6): qty 1

## 2012-02-17 MED ORDER — WARFARIN VIDEO
Freq: Once | Status: AC
  Administered 2012-02-17: 14:00:00

## 2012-02-17 NOTE — Evaluation (Signed)
Physical Therapy Evaluation Patient Details Name: Kathryn [name] MRN: [id number] DOB: [birth year] Today's Date: 02/17/2012 Time: 6387-5643 PT Time Calculation (min): 28 min  PT Assessment / Plan / Recommendation Clinical Impression  Pt is 61 y/o female admitted for s/p mid foot ambulation.  Pt will benefit from acute PT services to improve overall mobility and prepare for safe d/c to next venue.  Pt very motivated and willing to work.      PT Assessment  Patient needs continued PT services    Follow Up Recommendations  Post acute inpatient    Does the patient have the potential to tolerate intense rehabilitation   No, Recommend SNF  Barriers to Discharge None      Equipment Recommendations  None recommended by PT    Recommendations for Other Services     Frequency Min 5X/week    Precautions / Restrictions Precautions Precautions: Fall Restrictions Weight Bearing Restrictions: Yes LLE Weight Bearing: Non weight bearing   Pertinent Vitals/Pain No c/o pain      Mobility  Bed Mobility Bed Mobility: Supine to Sit Supine to Sit: 4: Min guard Details for Bed Mobility Assistance: Minguard for safety with cues for hand placement Transfers Transfers: Sit to Stand;Stand to Sit;Stand Pivot Transfers Sit to Stand: 4: Min assist;From bed Stand to Sit: 4: Min assist;To bed;To chair/3-in-1 Stand Pivot Transfers: 3: Mod assist Details for Transfer Assistance: (A) to initiate transfer and slowly descend to chair with max cues for hand placement.  (A) to maintain balance and cues to maintain NWB. Ambulation/Gait Ambulation/Gait Assistance: Not tested (comment)    Shoulder Instructions     Exercises     PT Diagnosis: Difficulty walking;Abnormality of gait;Generalized weakness;Acute pain  PT Problem List: Decreased strength;Decreased range of motion;Decreased activity tolerance;Decreased balance;Decreased mobility;Decreased knowledge of use of DME;Pain PT Treatment Interventions:  DME instruction;Gait training;Stair training;Functional mobility training;Therapeutic activities;Therapeutic exercise;Balance training;Patient/family education   PT Goals Acute Rehab PT Goals PT Goal Formulation: With patient Time For Goal Achievement: 02/24/12 Potential to Achieve Goals: Good Pt will go Supine/Side to Sit: with modified independence PT Goal: Supine/Side to Sit - Progress: Goal set today Pt will go Sit to Supine/Side: with modified independence PT Goal: Sit to Supine/Side - Progress: Goal set today Pt will go Sit to Stand: with supervision PT Goal: Sit to Stand - Progress: Goal set today Pt will go Stand to Sit: with supervision PT Goal: Stand to Sit - Progress: Goal set today Pt will Transfer Bed to Chair/Chair to Bed: with supervision PT Transfer Goal: Bed to Chair/Chair to Bed - Progress: Goal set today Pt will Ambulate: 1 - 15 feet;with min assist;with rolling walker PT Goal: Ambulate - Progress: Goal set today  Visit Information  Last PT Received On: 02/17/12 Assistance Needed: +1    Subjective Data  Subjective: "My husband just passed away three weeks ago." Patient Stated Goal: Ultimate goal is to go home.     Prior Functioning  Home Living Lives With: Spouse Available Help at Discharge: Family (if needed) Type of Home: House Home Access: Stairs to enter [employer] of Steps: 2 Entrance Stairs-Rails: Left Home Layout: One level Bathroom Shower/Tub: Engineer, manufacturing systems: Handicapped height Bathroom Accessibility: Yes How Accessible: Accessible via walker Home Adaptive Equipment: Wheelchair - manual;Walker - rolling;Straight cane Prior Function Level of Independence:  (intermittent assistance with ADL, supervision getting tub) Able to Take Stairs?: Yes Communication Communication: No difficulties    Cognition  Overall Cognitive Status: Appears within functional limits for tasks  assessed/performed Arousal/Alertness:  Awake/alert Orientation Level: Appears intact for tasks assessed Behavior During Session: [facility] for tasks performed    Extremity/Trunk Assessment Right Upper Extremity Assessment RUE ROM/Strength/Tone: Within functional levels Left Upper Extremity Assessment LUE ROM/Strength/Tone: Within functional levels Right Lower Extremity Assessment RLE ROM/Strength/Tone: Within functional levels Left Lower Extremity Assessment LLE ROM/Strength/Tone: Deficits;Unable to fully assess;Due to pain LLE ROM/Strength/Tone Deficits: At least 3/5 gross with functional mobility LLE Sensation: History of peripheral neuropathy   Balance    End of Session PT - End of Session Equipment Utilized During Treatment: Gait belt Activity Tolerance: Patient tolerated treatment well Patient left: in chair;with call bell/phone within reach Nurse Communication: Mobility status  GP     Kathryn [name] 02/17/2012, 3:50 PM [name], PT DPT [phone]

## 2012-02-17 NOTE — Progress Notes (Signed)
Orthopedic Tech Progress Note Patient Details:  Kathryn [name] 02-23-1951 [id number]  Ortho Devices Type of Ortho Device: Postop boot   Haskell Flirt 02/17/2012, 1:37 AM

## 2012-02-17 NOTE — Progress Notes (Signed)
Patient ID: Kathryn [name], female   DOB: 05-Nov-1950, 61 y.o.   MRN: [id number] Postoperative day 1 left midfoot amputation. Patient will require discharge to skilled nursing facility. Physical therapy progressive ambulation nonweightbearing on the left. Patient is comfortable this morning.

## 2012-02-17 NOTE — Progress Notes (Signed)
Nutrition Brief Note  Patient identified on the Malnutrition Screening Tool (MST) Report  Body mass index is 19.65 kg/(m^2). Pt meets criteria for wnl based on current BMI.   Current diet order is CHO Mod Medium, patient is consuming approximately 50% of meals at this time. Labs and medications reviewed.  Pt's usual wt is 109 lbs with increased variation over the past yr, ranging from 99-129 lbs.  Pt states she has lost wt, however denies concern and feels she is eating well compared to her usual.  Pt states she keeps multiple snacks on hand, including sources of protein, and has family help with ensuring adequate nutrition.  No nutrition interventions warranted at this time. If nutrition issues arise, please consult RD.    Loyce Dys, MS RD LDN Clinical Inpatient Dietitian Pager: [phone] Weekend/After hours pager: [phone]

## 2012-02-17 NOTE — Progress Notes (Signed)
UR COMPLETED  

## 2012-02-17 NOTE — Progress Notes (Addendum)
ANTICOAGULATION CONSULT NOTE - Follow Up Consult  Pharmacy Consult for Coumadin Indication: VTE prophylaxis  No Known Allergies  Patient Measurements: Height: 5\' 1"  (154.9 cm) Weight: 104 lb (47.174 kg) IBW/kg (Calculated) : 47.8   Vital Signs: Temp: 98.8 F (37.1 C) (11/01 0444) BP: 86/62 mmHg (11/01 1053) Pulse Rate: 76  (11/01 1053)  Labs:  [facility] 02/17/12 0643 02/16/12 1432  HGB 8.6* 9.2*  HCT 26.1* 27.6*  PLT 312 351  APTT -- --  LABPROT 16.0* --  INR 1.31 --  HEPARINUNFRC -- --  CREATININE -- 1.20*  CKTOTAL -- --  CKMB -- --  TROPONINI -- --    Estimated Creatinine Clearance: 37.6 ml/min (by C-G formula based on Cr of 1.2).    Assessment: 61 yo female admitted for osteomyelitis and abscess left forefoot now s/p transmetatarsal amputation on 02/16/12 to continue on Coumadin for VTE prophylaxis. Baseline INR of 1.17. Patient does have following high risk factors: Albumin < 2.5 (12/2011), Wt < 50 kg.  INR today is trending up towards goal at 1.31 after one dose of 2.5 mg last night. Hgb trending down/decreased postop (9.2>8.6), plt stable. No bleeding noted per RN.   Goal of Therapy:  INR 2-3 Monitor platelets by anticoagulation protocol: Yes   Plan:  Repeat Coumadin 2.5 mg PO x1 Follow up daily PT/INR Monitor for s/sx of bleeding Coumadin book/video    Crist Fat L 02/17/2012,12:08 PM

## 2012-02-18 LAB — CBC
HCT: 26.5 % — ABNORMAL LOW (ref 36.0–46.0)
MCHC: 32.8 g/dL (ref 30.0–36.0)
RDW: 15 % (ref 11.5–15.5)

## 2012-02-18 LAB — PROTIME-INR
INR: 1.99 — ABNORMAL HIGH (ref 0.00–1.49)
Prothrombin Time: 21.8 seconds — ABNORMAL HIGH (ref 11.6–15.2)

## 2012-02-18 MED ORDER — WARFARIN SODIUM 1 MG PO TABS
1.0000 mg | ORAL_TABLET | Freq: Once | ORAL | Status: AC
  Administered 2012-02-18: 1 mg via ORAL
  Filled 2012-02-18: qty 1

## 2012-02-18 MED ORDER — HYDROCODONE-ACETAMINOPHEN 7.5-325 MG PO TABS
1.0000 | ORAL_TABLET | ORAL | Status: DC | PRN
  Administered 2012-02-18 – 2012-02-20 (×11): 2 via ORAL
  Filled 2012-02-18 (×10): qty 2

## 2012-02-18 MED ORDER — HYDROCODONE-ACETAMINOPHEN 7.5-325 MG PO TABS
ORAL_TABLET | ORAL | Status: AC
  Filled 2012-02-18: qty 2

## 2012-02-18 NOTE — Progress Notes (Signed)
Physical Therapy Treatment Patient Details Name: Kathryn [name] MRN: [id number] DOB: 08-Aug-1950 Today's Date: 02/18/2012 Time: 1201-1225 PT Time Calculation (min): 24 min  PT Assessment / Plan / Recommendation Comments on Treatment Session  Good progress today despite increased pain levels with less assitance needed with overall mobiltiy.    Follow Up Recommendations  Post acute inpatient     Does the patient have the potential to tolerate intense rehabilitation  No, Recommend SNF     Equipment Recommendations  None recommended by PT       Frequency Min 5X/week   Plan Discharge plan remains appropriate;Frequency remains appropriate    Precautions / Restrictions Precautions Precautions: Fall Restrictions LLE Weight Bearing: Non weight bearing       Mobility  Bed Mobility Supine to Sit: 5: Supervision;With rails;HOB flat Details for Bed Mobility Assistance: cues for hand placement to assist with mobility/trunk transition into sitting Transfers Sit to Stand: 4: Min assist;From bed;With upper extremity assist;From chair/3-in-1 Stand to Sit: 4: Min assist;To chair/3-in-1;With armrests;With upper extremity assist Stand Pivot Transfers: 4: Min assist Details for Transfer Assistance: cues for hand placement with sit to/from stand transfers. on first stand from bed, pt with significant increase in left foot pain and needed to sit back down and elevate foot to releive the pain. on the second stand from the bed and all other transfers had the left foot propped on an overturned trash can so to assit with NWB and to keep the foot from being in a complete dependent postion for pain control. Pt able to stand from 3n1 with less assistance due to better ant wt shift for standing. min cue for posture and to adavance her left foot with both stand pivot transfer using a RW. assist needed to control her descent with all stand to sit transfers.                        PT Goals Acute Rehab PT  Goals PT Goal: Supine/Side to Sit - Progress: Progressing toward goal PT Goal: Sit to Stand - Progress: Progressing toward goal PT Goal: Stand to Sit - Progress: Progressing toward goal PT Transfer Goal: Bed to Chair/Chair to Bed - Progress: Progressing toward goal  Visit Information  Last PT Received On: 02/18/12 Assistance Needed: +1    Subjective Data  Subjective: Reports having a bad day today due to couple mishaps with her food tray and delayed responses to calling for bathroom, RN has been made aware and is addressing these issues. Reports a "throbbing" pain in her left foot. RN called and pain meds requested.   Cognition  Overall Cognitive Status: Appears within functional limits for tasks assessed/performed Arousal/Alertness: Awake/alert Orientation Level: Appears intact for tasks assessed Behavior During Session: [facility] for tasks performed       End of Session PT - End of Session Equipment Utilized During Treatment: Gait belt Activity Tolerance: Patient tolerated treatment well;Patient limited by pain Patient left: in chair;with call bell/phone within reach;with nursing in room Nurse Communication: Mobility status;Patient requests pain meds;Weight bearing status   GP     Sallyanne Kuster 02/18/2012, 1:34 PM  Sallyanne Kuster, PTA Office- [phone]

## 2012-02-18 NOTE — Progress Notes (Signed)
Subjective: 2 Days Post-Op Procedure(s) (LRB): TRANSMETATARSAL AMPUTATION (Left) Patient reports pain as moderate.    Objective: Vital signs in last 24 hours: Temp:  [98.3 F (36.8 C)-99.3 F (37.4 C)] 98.3 F (36.8 C) (11/02 0451) Pulse Rate:  [76-94] 94  (11/02 0451) Resp:  [16] 16  (11/02 0451) BP: (86-105)/(62-70) 98/69 mmHg (11/02 0451) SpO2:  [94 %-100 %] 100 % (11/02 0451) Weight:  [47.174 kg (104 lb)] 47.174 kg (104 lb) (11/02 0454)  Intake/Output from previous day: 11/01 0701 - 11/02 0700 In: 230 [I.V.:180; IV Piggyback:50] Out: -  Intake/Output this shift:     Basename 02/18/12 0440 02/17/12 0643 02/16/12 1432  HGB 8.7* 8.6* 9.2*    Basename 02/18/12 0440 02/17/12 0643  WBC 9.1 6.8  RBC 2.80* 2.77*  HCT 26.5* 26.1*  PLT 327 312    Basename 02/16/12 1432  NA 134*  K 3.6  CL 96  CO2 24  BUN 20  CREATININE 1.20*  GLUCOSE 81  CALCIUM 8.8    Basename 02/18/12 0440 02/17/12 0643  LABPT -- --  INR 1.99* 1.31    dressing dry.    Assessment/Plan: 2 Days Post-Op Procedure(s) (LRB): TRANSMETATARSAL AMPUTATION (Left) Up with therapy   ,   FL-2 signed.   D/C   IV  Kassius Battiste C 02/18/2012, 9:36 AM

## 2012-02-18 NOTE — Progress Notes (Signed)
ANTICOAGULATION CONSULT NOTE - Follow Up Consult  Pharmacy Consult for warfarin Indication: VTE prophylaxis  No Known Allergies  Patient Measurements: Height: 5\' 1"  (154.9 cm) Weight: 104 lb (47.174 kg) IBW/kg (Calculated) : 47.8    Vital Signs: Temp: 98.3 F (36.8 C) (11/02 0451) Temp src: Oral (11/02 0451) BP: 98/69 mmHg (11/02 0451) Pulse Rate: 94  (11/02 0451)  Labs:  Basename 02/18/12 0440 02/17/12 0643 02/16/12 1432  HGB 8.7* 8.6* --  HCT 26.5* 26.1* 27.6*  PLT 327 312 351  APTT -- -- --  LABPROT 21.8* 16.0* --  INR 1.99* 1.31 --  HEPARINUNFRC -- -- --  CREATININE -- -- 1.20*  CKTOTAL -- -- --  CKMB -- -- --  TROPONINI -- -- --    Estimated Creatinine Clearance: 37.6 ml/min (by C-G formula based on Cr of 1.2).   Medications:  Scheduled:    . coumadin book   Does not apply Once  . feeding supplement  1-2 Container Oral Daily  . folic acid  1 mg Oral Daily  . furosemide  40 mg Oral BID  . HYDROcodone-acetaminophen      . influenza  inactive virus vaccine  0.5 mL Intramuscular Tomorrow-1000  . pantoprazole  40 mg Oral BID AC  . propranolol  20 mg Oral q morning - 10a  . spironolactone  50 mg Oral Daily  . thiamine  100 mg Oral Daily  . warfarin  2.5 mg Oral ONCE-1800  . warfarin   Does not apply Once  . Warfarin - Pharmacist Dosing Inpatient   Does not apply q1800    Assessment: 46 YOF admitted for osteomyelitis and abscess of her left foot for which she went transmetatarsal amputation on 10/31. Warfarin being used for VTE prophylaxis. Baseline INR was 1.17- patients does have alcoholic liver disease as well as low albumin and a low weight. She has received two doses of warfarin 2.5mg  (both on 11/1- one at 0040 and the other at 1654). Her INR today is 1.99. Hemoglobin is slightly low, but starting to increase after surgery and platelets are normal. No bleeding noted.  Goal of Therapy:  INR 2-3 Monitor platelets by anticoagulation protocol: Yes     Plan:  1. Warfarin 1mg  po x1 tonight 2. Daily CBC/INR 3. Monitor for hemoglobin to keep increasing and no signs of bleeding  Donyel Castagnola D. Lamonda Noxon, PharmD Clinical Pharmacist Pager: [phone] Phone: [phone] 02/18/2012 1:32 PM

## 2012-02-19 LAB — PROTIME-INR
INR: 2.9 — ABNORMAL HIGH (ref 0.00–1.49)
Prothrombin Time: 28.8 seconds — ABNORMAL HIGH (ref 11.6–15.2)

## 2012-02-19 LAB — CBC
HCT: 24.4 % — ABNORMAL LOW (ref 36.0–46.0)
MCHC: 33.2 g/dL (ref 30.0–36.0)
RDW: 15 % (ref 11.5–15.5)

## 2012-02-19 LAB — COMPREHENSIVE METABOLIC PANEL
Albumin: 2.1 g/dL — ABNORMAL LOW (ref 3.5–5.2)
Alkaline Phosphatase: 148 U/L — ABNORMAL HIGH (ref 39–117)
BUN: 16 mg/dL (ref 6–23)
Calcium: 9 mg/dL (ref 8.4–10.5)
Creatinine, Ser: 0.96 mg/dL (ref 0.50–1.10)
Potassium: 3.3 mEq/L — ABNORMAL LOW (ref 3.5–5.1)
Total Protein: 6.6 g/dL (ref 6.0–8.3)

## 2012-02-19 LAB — AMMONIA: Ammonia: 14 umol/L (ref 11–60)

## 2012-02-19 MED ORDER — SPIRONOLACTONE 50 MG PO TABS
50.0000 mg | ORAL_TABLET | Freq: Every day | ORAL | Status: DC
  Administered 2012-02-20: 50 mg via ORAL
  Filled 2012-02-19 (×2): qty 1

## 2012-02-19 MED ORDER — WARFARIN SODIUM 1 MG PO TABS
1.0000 mg | ORAL_TABLET | Freq: Once | ORAL | Status: AC
  Administered 2012-02-19: 1 mg via ORAL
  Filled 2012-02-19: qty 1

## 2012-02-19 MED ORDER — BISACODYL 10 MG RE SUPP: 10.0000 mg | Freq: Every day | RECTAL | Status: DC | PRN

## 2012-02-19 NOTE — Progress Notes (Signed)
Subjective: 3 Days Post-Op Procedure(s) (LRB): TRANSMETATARSAL AMPUTATION (Left) Patient reports pain as mild.    Objective: Vital signs in last 24 hours: Temp:  [98.8 F (37.1 C)-99.6 F (37.6 C)] 98.8 F (37.1 C) (11/03 1512) Pulse Rate:  [94-95] 94  (11/03 0705) Resp:  [16-18] 18  (11/03 1512) BP: (90-99)/(61-69) 93/65 mmHg (11/03 1512) SpO2:  [98 %-100 %] 100 % (11/03 1512)  Intake/Output from previous day: 11/02 0701 - 11/03 0700 In: 740 [P.O.:740] Out: 300 [Urine:300] Intake/Output this shift: Total I/O In: 800 [P.O.:800] Out: -    Basename 02/19/12 0605 02/18/12 0440 02/17/12 0643  HGB 8.1* 8.7* 8.6*    Basename 02/19/12 0605 02/18/12 0440  WBC 6.0 9.1  RBC 2.61* 2.80*  HCT 24.4* 26.5*  PLT 294 327    Basename 02/19/12 0901  NA 134*  K 3.3*  CL 99  CO2 26  BUN 16  CREATININE 0.96  GLUCOSE 118*  CALCIUM 9.0    Basename 02/19/12 0605 02/18/12 0440  LABPT -- --  INR 2.90* 1.99*    Neurologically intact  Assessment/Plan: 3 Days Post-Op Procedure(s) (LRB): TRANSMETATARSAL AMPUTATION (Left) Up with therapy  Ammonia level normal . Working on UE strengthening. Plan placement  Kathryn [name] C 02/19/2012, 5:12 PM

## 2012-02-19 NOTE — Progress Notes (Signed)
Physical Therapy Treatment Patient Details Name: Kathryn [name] MRN: [id number] DOB: 1951-01-21 Today's Date: 02/19/2012 Time: 4098-1191 PT Time Calculation (min): 25 min  PT Assessment / Plan / Recommendation Comments on Treatment Session  Less assistance needed with mobility today and pt reported less pain with left LE dependent positon with transfers today. Pt with concerns of UE weakness and use of walker with NWB. She reports that home heatlh therapy had been working with her on UE strenghtening with use of therabands. Issued Level 2 theraband and educated pt on 3 exercises to assist with UE strenghening for pt to continue doing on her own.                                  Follow Up Recommendations  Post acute inpatient     Does the patient have the potential to tolerate intense rehabilitation  No, Recommend SNF     Equipment Recommendations  None recommended by PT       Frequency Min 5X/week   Plan Discharge plan remains appropriate;Frequency remains appropriate    Precautions / Restrictions Precautions Precautions: Fall Restrictions LLE Weight Bearing: Non weight bearing       Mobility  Bed Mobility Supine to Sit: 5: Supervision;HOB flat Details for Bed Mobility Assistance: cues for hand/UE position to assist with moving to edge of bed. no rails used today. Transfers Sit to Stand: 4: Min guard;From bed;With upper extremity assist;From chair/3-in-1;4: Min assist Stand to Sit: 4: Min guard;To chair/3-in-1;With upper extremity assist;With armrests Stand Pivot Transfers: 4: Min assist Details for Transfer Assistance: assistance needed to complete stand from bed (min) and min guard only with standing from bsc. cues for hand placement for saftey with standing up from both surfaces. Min assist for balance and walker negotiation with both stand pivot transfers. cues for posture and to advance right foot toward destination surface. Pt unable to "hop" step, only able to  shuffle/wiggle right foot. with cues to reach back with sitting pt able to control her descent. pt able to maintain NWB on left foot without propping today (able to tolerate fully dependent postion).                           Exercises General Exercises - Upper Extremity Shoulder Horizontal ABduction: AROM;Theraband;Right;Left;10 reps;Seated Theraband Level (Shoulder Horizontal Abduction): Level 2 (Red) Other Exercises Other Exercises: shoulder alternating diagonals with Level 2 theraband X10 each side seated in recliner. Other Exercises: UE chest press with Level 2 theraband x10 seated in recliner.     PT Goals Acute Rehab PT Goals PT Goal: Supine/Side to Sit - Progress: Progressing toward goal PT Goal: Sit to Stand - Progress: Progressing toward goal PT Goal: Stand to Sit - Progress: Progressing toward goal PT Transfer Goal: Bed to Chair/Chair to Bed - Progress: Progressing toward goal  Visit Information  Last PT Received On: 02/19/12 Assistance Needed: +1    Subjective Data  Subjective: No new complaints, Feeling better today. Rates her left foot pain at 4/10. RN in room and premedicated pt with po pain meds.   Cognition  Overall Cognitive Status: Appears within functional limits for tasks assessed/performed Arousal/Alertness: Awake/alert Orientation Level: Appears intact for tasks assessed Behavior During Session: [facility] for tasks performed       End of Session PT - End of Session Equipment Utilized During Treatment: Gait belt Activity Tolerance: Patient tolerated  treatment well Patient left: in chair;with call bell/phone within reach Nurse Communication: Mobility status   GP     Sallyanne Kuster 02/19/2012, 12:40 PM  Sallyanne Kuster, PTA Office- [phone]

## 2012-02-19 NOTE — Progress Notes (Signed)
ANTICOAGULATION CONSULT NOTE - Follow Up Consult  Pharmacy Consult for warfarin Indication: VTE prophylaxis  No Known Allergies  Patient Measurements: Height: 5\' 1"  (154.9 cm) Weight: 104 lb (47.174 kg) IBW/kg (Calculated) : 47.8    Vital Signs: Temp: 98.9 F (37.2 C) (11/03 0705) BP: 90/61 mmHg (11/03 0705) Pulse Rate: 94  (11/03 0705)  Labs:  Basename 02/19/12 0901 02/19/12 0605 02/18/12 0440 02/17/12 0643 02/16/12 1432  HGB -- 8.1* 8.7* -- --  HCT -- 24.4* 26.5* 26.1* --  PLT -- 294 327 312 --  APTT -- -- -- -- --  LABPROT -- 28.8* 21.8* 16.0* --  INR -- 2.90* 1.99* 1.31 --  HEPARINUNFRC -- -- -- -- --  CREATININE 0.96 -- -- -- 1.20*  CKTOTAL -- -- -- -- --  CKMB -- -- -- -- --  TROPONINI -- -- -- -- --    Estimated Creatinine Clearance: 47 ml/min (by C-G formula based on Cr of 0.96).   Medications:  Scheduled:     . feeding supplement  1-2 Container Oral Daily  . folic acid  1 mg Oral Daily  . furosemide  40 mg Oral BID  . [EXPIRED] HYDROcodone-acetaminophen      . [COMPLETED] influenza  inactive virus vaccine  0.5 mL Intramuscular Tomorrow-1000  . pantoprazole  40 mg Oral BID AC  . propranolol  20 mg Oral q morning - 10a  . spironolactone  50 mg Oral Daily  . thiamine  100 mg Oral Daily  . [COMPLETED] warfarin  1 mg Oral ONCE-1800  . Warfarin - Pharmacist Dosing Inpatient   Does not apply q1800  . [DISCONTINUED] spironolactone  50 mg Oral Daily    Assessment: 60 YOF admitted for osteomyelitis and abscess of her left foot for which she went transmetatarsal amputation on 10/31. Warfarin being used for VTE prophylaxis. Baseline INR was 1.17 on 10/17- patients does have alcoholic liver disease as well as low albumin and a low weight. She received two doses of warfarin 2.5mg  on 11/1 (one at 0040 and the other at 1654) and 1mg  on 11/2. Her INR today is 2.9. Hemoglobin and platelets both decreased slightly, but no bleeding noted.  Goal of Therapy:  INR  2-3 Monitor platelets by anticoagulation protocol: Yes   Plan:  1. Warfarin 1mg  po x1 tonight 2. Daily CBC/INR 3. Monitor for hemoglobin to keep increasing and no signs of bleeding  Abdulhamid Olgin D. Brayam Boeke, PharmD Clinical Pharmacist Pager: [phone] Phone: [phone] 02/19/2012 1:56 PM

## 2012-02-20 ENCOUNTER — Non-Acute Institutional Stay: Payer: Self-pay | Admitting: Family Medicine

## 2012-02-20 LAB — PROTIME-INR
INR: 3.74 — ABNORMAL HIGH (ref 0.00–1.49)
Prothrombin Time: 34.8 seconds — ABNORMAL HIGH (ref 11.6–15.2)

## 2012-02-20 LAB — CBC
Platelets: 341 10*3/uL (ref 150–400)
RBC: 2.99 MIL/uL — ABNORMAL LOW (ref 3.87–5.11)
WBC: 5.5 10*3/uL (ref 4.0–10.5)

## 2012-02-20 MED ORDER — HYDROCODONE-ACETAMINOPHEN 5-500 MG PO TABS: 1.0000 | ORAL_TABLET | Freq: Four times a day (QID) | ORAL | Status: DC | PRN

## 2012-02-20 NOTE — Progress Notes (Signed)
Pharmacy Consult-Anticoagulation  Pharmacy Consult: 61 y/o female is currently on Coumadin  for VTE prophylaxis.   Current Labs: Hematology  [facility] 02/20/12 0500 02/19/12 0605 02/18/12 0440  HGB 9.1* 8.1* --  HCT 28.0* 24.4* 26.5*  PLT 341 294 327  LABPROT 34.8* 28.8* 21.8*  INR 3.74* 2.90* 1.99*   Lab Results  Component Value Date   INR 3.74* 02/20/2012   INR 2.90* 02/19/2012   INR 1.99* 02/18/2012    Estimated Creatinine Clearance: 47 ml/min (by C-G formula based on Cr of 0.96).  Current Meds:    feeding supplement 1-2 Container Oral Daily  folic acid 1 mg Oral Daily  furosemide 40 mg Oral BID  pantoprazole 40 mg Oral BID AC  propranolol 20 mg Oral q morning - 10a  spironolactone 50 mg Oral Daily  thiamine 100 mg Oral Daily  [COMPLETED] warfarin 1 mg Oral ONCE-1800  Warfarin - Pharmacist Dosing Inpatient  Does not apply q1800    Assessment:  INR is SUPRAtherapeutic after only 3 Coumadin doses.  Last HGB up 9.1.  No bleeding complications observed.  Goal/Plan:  INR goal is 2-3.  Coumadin will be held today.  Daily INR's, CBC.  Chery Giusto, Elisha Headland, Pharm.D. 02/20/2012  9:20 AM

## 2012-02-20 NOTE — Discharge Summary (Signed)
Physician Discharge Summary  Patient ID: Kathryn [name] MRN: [id number] DOB/AGE: 06-27-1950 61 y.o.  Admit date: 02/16/2012 Discharge date: 02/20/2012  Admission Diagnoses: Osteomyelitis abscess left forefoot.  Discharge Diagnoses: Same Active Problems:  * No active hospital problems. *    Discharged Condition: stable  Hospital Course: Patient's hospital course was essentially unremarkable. She underwent a midfoot amputation postoperatively patient progressed slowly with therapy she is unable to ambulate independently does not have the strength for inpatient rehabilitation and is scheduled for discharge to skilled nursing.  Consults: None  Significant Diagnostic Studies: labs: Routine labs  Treatments: surgery: See operative note  Discharge Exam: Blood pressure 92/62, pulse 87, temperature 98.5 F (36.9 C), temperature source Oral, resp. rate 18, height 5\' 1"  (1.549 m), weight 47.174 kg (104 lb), SpO2 100.00%. Incision/Wound: incision clean dry at time of discharge  Disposition: 01-Home or Self Care     Medication List     As of 02/20/2012  6:21 AM    ASK your doctor about these medications         CALCIUM-VITAMIN D PO   Take 1 tablet by mouth 2 (two) times daily.      clindamycin 150 MG capsule   Commonly known as: CLEOCIN   Take 300 mg by mouth 4 (four) times daily.      feeding supplement Liqd   Take 1-2 Containers by mouth daily. Drinks at least one can a day and sometimes 2 cans      folic acid 1 MG tablet   Commonly known as: FOLVITE   Take 1 tablet (1 mg total) by mouth daily.      furosemide 40 MG tablet   Commonly known as: LASIX   Take 1 tablet (40 mg total) by mouth 2 (two) times daily.      HYDROcodone-acetaminophen 5-500 MG per tablet   Commonly known as: VICODIN   Take 1 tablet by mouth every 8 (eight) hours as needed. For pain      lactulose 10 GM/15ML solution   Commonly known as: CHRONULAC   Take 30 mLs (20 g total) by mouth 2 (two) times  daily.      OVER THE COUNTER MEDICATION   Take 1 tablet by mouth at bedtime as needed. Equate sleep aid for sleep      OVER THE COUNTER MEDICATION   Take 1 tablet by mouth at bedtime as needed. For sleep. Equate sleep aid      pantoprazole 40 MG tablet   Commonly known as: PROTONIX   Take 1 tablet (40 mg total) by mouth 2 (two) times daily before a meal.      propranolol 20 MG/5ML solution   Commonly known as: INDERAL   Take 5 mLs (20 mg total) by mouth every morning.      spironolactone 50 MG tablet   Commonly known as: ALDACTONE   Take 1 tablet (50 mg total) by mouth daily.      thiamine 100 MG tablet   Take 1 tablet (100 mg total) by mouth daily.           Follow-up Information    Follow up with DUDA,MARCUS V, MD. In 2 weeks.   Contact information:   [address] [place] [address] [phone]          Signed: Nadara Mustard 02/20/2012, 6:21 AM

## 2012-02-20 NOTE — Progress Notes (Signed)
Orthopedic Tech Progress Note Patient Details:  [name] 11-04-1950 [id number] Attending nurse Ivar Drape) called [facility] to pick up post op shoe. Nurse stated there had been no order for shoe and patient had not worn shoe. Upon further investigation I found that there was an order for a post op shoe placed on 02/16/12 and [facility] [employer] delivered shoe. Unclear as to whether or not patient was ever fitted with shoe by Ortho Tech (does not state either way in original progress note). Patient was charged for [employer] visit, Post op shoe, and Foot/ankle strapping. I picked up post op shoe from nurse on 02/20/12 and it appeared to have not been worn nor fitted (shoe in original bag, velcro straps not fastened and had not been cut). Patient wants to be reimbursed for charges. Note explaining situation as well as post op shoe given to Gar Gibbon with patient sticker attached. Patient ID: Kathryn [name], female   DOB: 01-Feb-1951, 61 y.o.   MRN: [id number]   Orie Rout 02/20/2012, 1:54 PM

## 2012-02-20 NOTE — Progress Notes (Signed)
Physical Therapy Treatment Patient Details Name: Kathryn [name] MRN: [id number] DOB: [birth year] Today's Date: 02/20/2012 Time: 7829-5621 PT Time Calculation (min): 23 min  PT Assessment / Plan / Recommendation Comments on Treatment Session  Patient continues to be very motivated. Able to stand with less assistance today and added in LE therex as well.     Follow Up Recommendations  Post acute inpatient     Does the patient have the potential to tolerate intense rehabilitation  No, Recommend SNF  Barriers to Discharge        Equipment Recommendations  None recommended by PT    Recommendations for Other Services    Frequency Min 5X/week   Plan Discharge plan remains appropriate;Frequency remains appropriate    Precautions / Restrictions Precautions Precautions: Fall Restrictions LLE Weight Bearing: Non weight bearing   Pertinent Vitals/Pain 6/10 LLE. RN made aware.     Mobility  Bed Mobility Supine to Sit: 6: Modified independent (Device/Increase time) Transfers Sit to Stand: 4: Min guard;With upper extremity assist;From bed Stand to Sit: To chair/3-in-1;4: Min guard;With upper extremity assist Stand Pivot Transfers: 4: Min assist Details for Transfer Assistance: Patient required Min A for transfer to recliner. Patient able to pivot right LE well but carrying alot of weight through her UEs and gets "caught" with RW. Patient requiring positioning assitance prior to sitting onto recliner. Patient able to maintain NWBing well.     Exercises General Exercises - Upper Extremity Shoulder Horizontal ABduction: AROM;Right;Left;20 reps Theraband Level (Shoulder Horizontal Abduction): Level 2 (Red) Elbow Flexion: AROM;Theraband;Both;20 reps Theraband Level (Elbow Flexion): Level 2 (Red) Chair Push Up: AROM;Both;20 reps General Exercises - Lower Extremity Quad Sets: AROM;Left;10 reps Heel Slides: AROM;Left;10 reps Hip ABduction/ADduction: AROM;Left;10 reps Straight Leg Raises:  AROM;Left;10 reps   PT Diagnosis:    PT Problem List:   PT Treatment Interventions:     PT Goals Acute Rehab PT Goals PT Goal: Supine/Side to Sit - Progress: Progressing toward goal PT Goal: Sit to Stand - Progress: Progressing toward goal PT Goal: Stand to Sit - Progress: Progressing toward goal PT Transfer Goal: Bed to Chair/Chair to Bed - Progress: Progressing toward goal  Visit Information  Last PT Received On: 02/20/12 Assistance Needed: +1    Subjective Data      Cognition  Overall Cognitive Status: Appears within functional limits for tasks assessed/performed Arousal/Alertness: Awake/alert Orientation Level: Appears intact for tasks assessed Behavior During Session: [facility] for tasks performed    Balance     End of Session PT - End of Session Equipment Utilized During Treatment: Gait belt Activity Tolerance: Patient tolerated treatment well Patient left: in chair;with call bell/phone within reach;with family/visitor present Nurse Communication: Mobility status   GP     Fredrich Birks 02/20/2012, 9:27 AM  02/20/2012 Fredrich Birks PTA [phone] pager [phone] office

## 2012-02-20 NOTE — Progress Notes (Signed)
Subjective:     Patient ID: Kathryn [name], female   DOB: [birth year], 61 y.o.   MRN: [id number]  HPI Comments: 61 y.o. F present to [facility] after hospital admission from 10/31-11/4/13 for transmetatarsal amputation for what sounds like a chronic, nonhealing ulcer with osteomyelitis of the 2nd metatarsal.  Patient was scheduled for this surgery after being seen by Dr. Lajoyce Corners in his outpatient clinic on 02/15/12 after referral by patient's (new) PCP Dr. Clovis Riley with [facility], which was facilitated by patietn's GI MD Dr. Dulce Sellar.  Patient reports initially noticing a hole in the bottom of her foot many months ago, but it did not cause any pain and did not have any bleeding or drainage.   While in the hospital last month (see below for details), an x-ray was done which did not show any active infection.  She was on antibiotics while in house, but was not d/c'ed on any. On 01/27/12 (the day of her husband's funeral), the wound started draining and she was taken to Urgent Care where she was started on a 7 day course of clindamycin, which did not make it better but prevented it from getting worse.  She was being monitored by Home Health and when the wound started getting worse, she was encouraged to see Dr. Clovis Riley, who referred her to Dr. Lajoyce Corners.    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  They attempted a surgery to fix her R hip when she was 84-64 years old, but she continued to have 1 leg shorter than the other.  She has since had a total R hip replacement.  When she was 61 yo, they did a procedure in her left leg at her knee to try to keep it from continuing to grow to see if they could make the 2 leg equal length; this did not work and caused significant vascular damage to her left lower leg.  Prior to this surgery, she had 2 toes on her left amputated at 2 separate times.  She had a fall in March 2013 (she reports this was NOT  related to EtOH) which resulted in a R humeral fracture as well as elbow dislocation per patient report.    Patient is seen by [facility] for her cirrhosis, which appears to be secondary to EtOH abuse.  Patient reports her last drink was in March 2013.  She denies tobacco or illicit drug use.   Of note, patient had rather dramatic hospitalization on 9/16-9/26/13 after having a fall at home.  Upon arrival to the ED, she was found to be hypotensive and had large volume hematemesis at which time she was intubated x3 days for airway protection.  She was found to be in septic shock due to underlying PNA.  She also had an NSTEMI.  She was started on propranolol for persistent PVCs at the time of discharge, which is the reason she is on this medication.  Patient required paracentesis during this hospitalization, with 5L of fluid removed.  Patient had been requiring outpatient paracentesis prior to this admission for refractory ascites; patient reports a total of 5, all of which have been in the past 1 year.  This is the reason is she on Lasix.  Patient had an endoscopy in June 2013 which showed distal circumferential erosive esophagitis but no evidence of esophageal varices + some questionable mild portal gastropathy.  No definitve cause for her hematemesis  was identified in 12/2011, however, based on GI progress notes, it can be inferred that they assumed the cause was her severe esophagitis.  No repeat EGD needed to be done.   Patient reports a 20# weight loss over the past 1 year, although her weight fluctuates with the amount of her ascites and if she has a paracentesis.    On 01/27/12, patient's husband of 38 years passed away, relatively unexpectedly, after being admitted for what sounds like a CHF exacerbation during which time they discovered a lung mass and possible tracheal mass, ultimately leading to his unfortunately demise.  Prior to her hospitalization Sept 2013, patient was able to perform  all ADL's on her own.  After she was d/c'ed home, she was only able to feed herself.  She needed help dressing and bathing.  She also had functional incontinence, using Depends, from that time on, due to weakness and being unable to make it to the bathroom quickly enough.    Patient is to remain FULL CODE.  She is here for short term rehab to regain strength.  She has two adult children that live in the area as well as a sister who will be able to help her when she is d/c'ed home.  Her daughter, Danella Deis, is to become her HCPOA (they are going to get the paper work done soon).     Review of Systems  Constitutional: Positive for activity change and unexpected weight change.  HENT: Negative for nosebleeds, congestion, rhinorrhea, trouble swallowing and neck pain.   Respiratory: Negative for chest tightness and shortness of breath.   Cardiovascular: Negative for chest pain and leg swelling.  Gastrointestinal: Positive for nausea, constipation and abdominal distention. Negative for vomiting, abdominal pain and diarrhea.  Musculoskeletal: Positive for arthralgias.  Skin: Positive for wound.  Neurological: Positive for weakness. Negative for dizziness.  Psychiatric/Behavioral: Positive for dysphoric mood. Negative for suicidal ideas.       Objective:   Physical Exam  Constitutional: She is oriented to person, place, and time. She appears well-developed and well-nourished. She appears cachectic. She does not appear ill. No distress.       Appears older than stated age  HENT:  Head: Normocephalic and atraumatic.  Nose: Nose normal.  Mouth/Throat: Oropharynx is clear and moist. She has dentures. No oropharyngeal exudate.       Partial dentures on left upper teeth  Eyes: Conjunctivae normal and EOM are normal. Pupils are equal, round, and reactive to light. No scleral icterus.  Neck: Normal range of motion. Neck supple.  Cardiovascular: Normal rate, regular rhythm, normal heart sounds and  intact distal pulses.   No murmur heard. Pulmonary/Chest: Effort normal and breath sounds normal. No respiratory distress. She has no wheezes. Right breast exhibits no mass, no skin change and no tenderness. Left breast exhibits no mass, no skin change and no tenderness. Breasts are symmetrical.  Abdominal: Bowel sounds are normal. She exhibits distension, fluid wave and ascites. There is no tenderness. There is no rebound.       Abdomen firm, prominent veins over anterior abdomen  Musculoskeletal: She exhibits tenderness. She exhibits no edema.       R leg with TED hose in place; L left with dressing over foot to distal 1/3 of calf. Good strength bilateral upper and lower extremities   Lymphadenopathy:    She has no cervical adenopathy.  Neurological: She is alert and oriented to person, place, and time. She has normal strength. No cranial nerve  deficit. Coordination normal.  Skin: Skin is warm and dry.       L foot wrapped with ACE bandage   Psychiatric: Her affect is labile.       Tearful when discussing her husband who recently passed away 3 1/2 weeks ago       Assessment/Plan:   62 yo F with known alcoholic cirrhosis now s/p L transmetatarsal amputation for nonhealing ulcer and osteomyelitis presents to SNF for short term rehab -Continue Clinda for 7 days post-op (will end on 02/23/12) -Pain control will be key for this patient; given her known cirrhosis, will use Percocet 10/325mg  q4hr PRN to minimize amount of Tylenol given to patient; may consider adding Oxycontin (long-acting) at bed time to help prevent significant pain during the night/AM -Monitor blood pressure -Monitor for need of paracentesis- patient will likely need GI f/u while still in SNF -PT for strengthening post-op -Wound care for post-op dressing changes -Lactulose is scheduled BID due to patient having significant diarrhea on TID dosing; will monitor for hepatic encephalopathy  -Patient reports last drink >6 months  ago; question how long folic acid and thiamine need to be continued

## 2012-02-20 NOTE — Progress Notes (Signed)
Patient ID: Kathryn [name], female   DOB: 03-Sep-1950, 61 y.o.   MRN: [id number] Plan for discharge to skilled nursing facility. F. L2 signed on chart. Discharge orders and summary completed.

## 2012-02-21 ENCOUNTER — Encounter (HOSPITAL_BASED_OUTPATIENT_CLINIC_OR_DEPARTMENT_OTHER): Payer: Medicare Other

## 2012-02-21 ENCOUNTER — Other Ambulatory Visit: Payer: Self-pay | Admitting: Family Medicine

## 2012-02-21 ENCOUNTER — Encounter: Payer: Self-pay | Admitting: Family Medicine

## 2012-02-21 ENCOUNTER — Non-Acute Institutional Stay: Payer: Self-pay | Admitting: Family Medicine

## 2012-02-21 DIAGNOSIS — E8809 Other disorders of plasma-protein metabolism, not elsewhere classified: Secondary | ICD-10-CM

## 2012-02-21 DIAGNOSIS — E876 Hypokalemia: Secondary | ICD-10-CM

## 2012-02-21 DIAGNOSIS — M86172 Other acute osteomyelitis, left ankle and foot: Secondary | ICD-10-CM

## 2012-02-21 DIAGNOSIS — E43 Unspecified severe protein-calorie malnutrition: Secondary | ICD-10-CM

## 2012-02-21 DIAGNOSIS — K709 Alcoholic liver disease, unspecified: Secondary | ICD-10-CM

## 2012-02-21 DIAGNOSIS — R197 Diarrhea, unspecified: Secondary | ICD-10-CM

## 2012-02-21 NOTE — Telephone Encounter (Signed)
Written and attached to fax to send to [facility] The hydrocodone 5 mg/500 was replace by hydrocodone/APAP 10/325 to increase pain relief with decreased APAP

## 2012-02-21 NOTE — Clinical Social Work Psychosocial (Addendum)
    Clinical Social Work Department BRIEF PSYCHOSOCIAL ASSESSMENT 02/21/2012  Patient:  Kathryn [name], Kathryn [name]     Account Number:  [id number]     Admit date:  02/16/2012  Clinical Social Worker:  Tiburcio Pea  Date/Time:  02/17/2012 06:50 PM  Referred by:  Physician  Date Referred:  02/17/2012 Referred for  SNF Placement   Other Referral:   Interview type:  Patient Other interview type:    PSYCHOSOCIAL DATA Living Status:  ALONE Admitted from facility:   Level of care:   Primary support name:   Primary support relationship to patient:   Degree of support available:   Strong support    CURRENT CONCERNS Current Concerns  Post-Acute Placement   Other Concerns:    SOCIAL WORK ASSESSMENT / PLAN Patient was admitted from home. Received call from Jodene Nam- [facility]. She stated that arrangements have been made with [facility] for pt to be placed and they were working on this prior to her admission to the hospital. Received a call from the SNF confirming this.  Met with pateint- she stated that she was not aware of this arrangement and would like to determine other choices.  Fl2 placed on chart for MD's signature. Bed search initaited.   Assessment/plan status:  Psychosocial Support/Ongoing Assessment of Needs Other assessment/ plan:   Information/referral to community resources:   SNF list provided to patient    PATIENT'S/FAMILY'S RESPONSE TO PLAN OF CARE: Patient is alert, oriented and very pleasant. She agrees to short term SNF and then hopes to return home.  She states that she has a supportive family.

## 2012-02-21 NOTE — Clinical Social Work Placement (Addendum)
    Clinical Social Work Department CLINICAL SOCIAL WORK PLACEMENT NOTE 02/21/2012  Patient:  Kathryn [name], Kathryn [name]  Account Number:  [id number] Admit date:  02/16/2012  Clinical Social Worker:  Lupita Leash Keara Pagliarulo, LCSWA  Date/time:  02/17/2012 06:30 PM  Clinical Social Work is seeking post-discharge placement for this patient at the following level of care:   SKILLED NURSING   (*CSW will update this form in Epic as items are completed)   02/17/2012  Patient/family provided with [facility] Department of Clinical Social Work's list of facilities offering this level of care within the geographic area requested by the patient (or if unable, by the patient's family).  02/17/2012  Patient/family informed of their freedom to choose among providers that offer the needed level of care, that participate in Medicare, Medicaid or managed care program needed by the patient, have an available bed and are willing to accept the patient.  02/17/2012  Patient/family informed of [facility]' ownership interest in [facility], as well as of the fact that they are under no obligation to receive care at this facility.  PASARR submitted to EDS on 02/20/12 PASARR number received from EDS on 02/20/12  FL2 transmitted to all facilities in geographic area requested by pt/family on  02/20/12 FL2 transmitted to all facilities within larger geographic area on   Patient informed that his/her managed care company has contracts with or will negotiate with  certain facilities, including the following:  NA   Patient/family informed of bed offers received: 02/20/12  Patient chooses bed at  [facility] Physician recommends and patient chooses bed at    Patient to be transferred to [facility]  on  02/20/12 Patient to be transferred to facility by ambulance [facility])  The following physician request were entered in Epic:   Additional Comments: Pt's sister toured several facilities offered and they chose [facility] and  [address]. They can offer a private room. Patient and sister were pleased with d/c plan.  Notified SNF and pt's nurse of d/c.  Lorri Frederick. [place]  [phone]

## 2012-02-23 ENCOUNTER — Encounter: Payer: Self-pay | Admitting: Family Medicine

## 2012-02-23 DIAGNOSIS — R197 Diarrhea, unspecified: Secondary | ICD-10-CM | POA: Insufficient documentation

## 2012-02-23 DIAGNOSIS — M86172 Other acute osteomyelitis, left ankle and foot: Secondary | ICD-10-CM | POA: Insufficient documentation

## 2012-02-23 NOTE — Assessment & Plan Note (Signed)
Check for C. Dif toxin due to current antibiotics. Will decrease Lactulose and titrate to 3-4 bowel movement daily.

## 2012-02-23 NOTE — Assessment & Plan Note (Signed)
Continue supplementation and monitoring of appetite

## 2012-02-23 NOTE — Progress Notes (Signed)
  Subjective:    Patient ID: Kathryn [name], female    DOB: 02/26/1951, 61 y.o.   MRN: [id number]  HPI Pain - chronic in legs, acute in the left foot which had recent transmetatarsal amputation by Dr Lajoyce Corners. Awakened once last night with pain.   Osteomyelitis  - of the left forefoot resulting in the amputation  Ascites - she reports a significant episode of hypotension after large volume paracentesis in September. At that time she had respiratory difficulty and pain related to the ascites. These symptoms haven't recurred, even though she's had some recurrence of ascitic fluid  Alcoholic cirrhosis - No longer drinking alcohol. She denies tremor or confusion. No peripheral edema recently. Had to cut back on the Lactulose due to diarrhea and fecal incontinence.   Malnutrition - No dysphagia. She is using Luna bars as a supplement between meals.   Depression - Mourning the recent death of her husband.  Disposition - she plans to live alone in her new house. Has much local family support. Review of Systems  Respiratory: Negative for shortness of breath.   Cardiovascular: Positive for leg swelling. Negative for chest pain.  Gastrointestinal: Negative for diarrhea.       Objective:   Physical Exam  Constitutional: She is oriented to person, place, and time.       Thin extremities. Enlarged abdomen. Appears older than stated age  Cardiovascular: Normal rate and regular rhythm.   No murmur heard. Pulmonary/Chest: Effort normal and breath sounds normal. She has no wheezes.  Abdominal: Bowel sounds are normal. She exhibits distension. She exhibits no mass. There is no tenderness. There is no rebound.       Firm and distended with prominent veins over the upper abdomen.  Musculoskeletal:       left foot wrapped in Coban with apparent amputation of forefoot.   Neurological: She is alert and oriented to person, place, and time.  Skin:       Hyperpigmented  Psychiatric: She has a normal mood and  affect. Her behavior is normal. Judgment and thought content normal.       Tearful when discussing the recent death of her husband.          Assessment & Plan:  I interviewed and examined this patient and discussed the treatment plan with Dr Fara Boros.

## 2012-02-23 NOTE — Assessment & Plan Note (Signed)
Last dose of Clindamycin 02/23/12. Per Dr Lajoyce Corners, it is to remain wrapped until he sees her 03/06/12

## 2012-03-04 ENCOUNTER — Telehealth: Payer: Self-pay | Admitting: Family Medicine

## 2012-03-04 NOTE — Telephone Encounter (Signed)
RN called for verbal order to give house stock percocet 5/325mg  q6 prn. The rx for her prescribed 10/325mg  tabs has not gone through the pharmacy yet.

## 2012-03-05 ENCOUNTER — Other Ambulatory Visit: Payer: Self-pay | Admitting: Family Medicine

## 2012-03-05 ENCOUNTER — Telehealth: Payer: Self-pay | Admitting: Family Medicine

## 2012-03-05 MED ORDER — HYDROCODONE-ACETAMINOPHEN 10-325 MG PO TABS: 1.0000 | ORAL_TABLET | ORAL | Status: DC | PRN

## 2012-03-05 NOTE — Telephone Encounter (Signed)
Out of 10/325 at nursing home.  Printing new rx.  Patient can have several more weeks of this dose before we start talking about decreasing.

## 2012-03-06 ENCOUNTER — Other Ambulatory Visit: Payer: Self-pay | Admitting: Family Medicine

## 2012-03-06 MED ORDER — OXYCODONE-ACETAMINOPHEN 10-325 MG PO TABS: 1.0000 | ORAL_TABLET | ORAL | Status: DC | PRN

## 2012-03-06 NOTE — Telephone Encounter (Signed)
Written and attached to fax to send to [facility]  They had returned a faxed hydrocodone/APAP Rx

## 2012-03-08 ENCOUNTER — Other Ambulatory Visit: Payer: Self-pay | Admitting: Family Medicine

## 2012-03-08 ENCOUNTER — Encounter: Payer: Self-pay | Admitting: Family Medicine

## 2012-03-08 MED ORDER — LACTULOSE 10 GM/15ML PO SOLN: ORAL | Status: DC

## 2012-03-08 MED ORDER — OMEPRAZOLE 20 MG PO CPDR: 20.0000 mg | DELAYED_RELEASE_CAPSULE | Freq: Every day | ORAL | Status: DC

## 2012-03-08 MED ORDER — PROPRANOLOL HCL 20 MG PO TABS: 20.0000 mg | ORAL_TABLET | Freq: Two times a day (BID) | ORAL | Status: DC

## 2012-03-09 ENCOUNTER — Telehealth: Payer: Self-pay | Admitting: Internal Medicine

## 2012-03-09 NOTE — Telephone Encounter (Signed)
Opened in error. BC °

## 2012-03-12 ENCOUNTER — Other Ambulatory Visit: Payer: Self-pay | Admitting: Family Medicine

## 2012-03-12 ENCOUNTER — Encounter: Payer: Self-pay | Admitting: Pharmacist

## 2012-03-12 DIAGNOSIS — I10 Essential (primary) hypertension: Secondary | ICD-10-CM

## 2012-03-12 DIAGNOSIS — F1020 Alcohol dependence, uncomplicated: Secondary | ICD-10-CM

## 2012-03-12 MED ORDER — TRAZODONE HCL 50 MG PO TABS: 25.0000 mg | ORAL_TABLET | Freq: Every evening | ORAL | Status: DC | PRN

## 2012-03-13 ENCOUNTER — Other Ambulatory Visit: Payer: Self-pay | Admitting: Family Medicine

## 2012-03-13 MED ORDER — OXYCODONE-ACETAMINOPHEN 10-325 MG PO TABS: 1.0000 | ORAL_TABLET | ORAL | Status: DC | PRN

## 2012-03-13 NOTE — Telephone Encounter (Signed)
Written and attached to fax to send to [facility]  

## 2012-03-14 ENCOUNTER — Non-Acute Institutional Stay: Payer: Self-pay | Admitting: Family Medicine

## 2012-03-14 DIAGNOSIS — M86172 Other acute osteomyelitis, left ankle and foot: Secondary | ICD-10-CM

## 2012-03-14 DIAGNOSIS — G4700 Insomnia, unspecified: Secondary | ICD-10-CM

## 2012-03-14 DIAGNOSIS — I10 Essential (primary) hypertension: Secondary | ICD-10-CM

## 2012-03-14 DIAGNOSIS — R188 Other ascites: Secondary | ICD-10-CM

## 2012-03-17 ENCOUNTER — Encounter: Payer: Self-pay | Admitting: Family Medicine

## 2012-03-17 DIAGNOSIS — G4700 Insomnia, unspecified: Secondary | ICD-10-CM | POA: Insufficient documentation

## 2012-03-17 NOTE — Assessment & Plan Note (Signed)
Weight up five pounds to 109.4 on 11/25, but was 111.2 on admission 02/21/12

## 2012-03-17 NOTE — Assessment & Plan Note (Signed)
Appears to be healing well 

## 2012-03-17 NOTE — Assessment & Plan Note (Signed)
Will start Mirtazapine to help sleep and appetite

## 2012-03-17 NOTE — Assessment & Plan Note (Signed)
well controlled  

## 2012-03-17 NOTE — Progress Notes (Signed)
  Subjective:    Patient ID: Kathryn [name], female    DOB: [birth year], 61 y.o.   MRN: [id number]  HPI Insomnia - the reason that she's been asking for pain medicine during the night.   Ascites - Stomach is getting tighter. Eating frequent small meals. She's supposed to see Dr Dulce Sellar, her gastroenterologist this afternoon if she can get a ride.   left forefoot amputation - Getting out of bed on her own. She will be seeing Dr Lajoyce Corners 12/9 and hopes to go home 12/10. She has some out of town relatives who will be staying with her for awhile.    Review of Systems     Objective:   Physical Exam  Constitutional:       Cachectic appearing except for a markedly enlarged abdomen.   Cardiovascular: Regular rhythm.        Tachycardic  Pulmonary/Chest: Effort normal.       Breathing easily lying. Decreased breath sounds at the bases R>L.   Abdominal: Soft. She exhibits distension. There is no tenderness.       Distended abdomen with prominent venous pattern  Musculoskeletal:       One plus sacral edema, none in elevated legs.   left foot surgical wound without signs of infection  Neurological: She is alert.       No asterexis.   Psychiatric: She has a normal mood and affect. Her behavior is normal. Judgment and thought content normal.          Assessment & Plan:

## 2012-03-20 ENCOUNTER — Encounter (HOSPITAL_BASED_OUTPATIENT_CLINIC_OR_DEPARTMENT_OTHER): Payer: Medicare Other

## 2012-03-23 ENCOUNTER — Other Ambulatory Visit (HOSPITAL_COMMUNITY): Payer: Self-pay | Admitting: Gastroenterology

## 2012-03-23 DIAGNOSIS — R188 Other ascites: Secondary | ICD-10-CM

## 2012-03-23 DIAGNOSIS — K7460 Unspecified cirrhosis of liver: Secondary | ICD-10-CM

## 2012-03-26 ENCOUNTER — Ambulatory Visit (HOSPITAL_COMMUNITY)
Admission: RE | Admit: 2012-03-26 | Discharge: 2012-03-26 | Disposition: A | Discharge status: Home / Self Care | Payer: Medicare Other | Source: Ambulatory Visit | Attending: Gastroenterology | Admitting: Gastroenterology

## 2012-03-26 ENCOUNTER — Other Ambulatory Visit: Payer: Self-pay | Admitting: Family Medicine

## 2012-03-26 ENCOUNTER — Non-Acute Institutional Stay: Payer: Self-pay | Admitting: Family Medicine

## 2012-03-26 ENCOUNTER — Encounter (HOSPITAL_COMMUNITY)
Admission: RE | Admit: 2012-03-26 | Discharge: 2012-03-26 | Disposition: A | Discharge status: Home / Self Care | Payer: Medicare Other | Source: Ambulatory Visit | Attending: Gastroenterology | Admitting: Gastroenterology

## 2012-03-26 DIAGNOSIS — F1020 Alcohol dependence, uncomplicated: Secondary | ICD-10-CM

## 2012-03-26 DIAGNOSIS — E43 Unspecified severe protein-calorie malnutrition: Secondary | ICD-10-CM | POA: Insufficient documentation

## 2012-03-26 DIAGNOSIS — K709 Alcoholic liver disease, unspecified: Secondary | ICD-10-CM | POA: Insufficient documentation

## 2012-03-26 DIAGNOSIS — I10 Essential (primary) hypertension: Secondary | ICD-10-CM

## 2012-03-26 DIAGNOSIS — M810 Age-related osteoporosis without current pathological fracture: Secondary | ICD-10-CM | POA: Insufficient documentation

## 2012-03-26 DIAGNOSIS — M1990 Unspecified osteoarthritis, unspecified site: Secondary | ICD-10-CM | POA: Insufficient documentation

## 2012-03-26 DIAGNOSIS — G5690 Unspecified mononeuropathy of unspecified upper limb: Secondary | ICD-10-CM | POA: Insufficient documentation

## 2012-03-26 DIAGNOSIS — R188 Other ascites: Secondary | ICD-10-CM | POA: Insufficient documentation

## 2012-03-26 DIAGNOSIS — K7460 Unspecified cirrhosis of liver: Secondary | ICD-10-CM | POA: Insufficient documentation

## 2012-03-26 DIAGNOSIS — Z01818 Encounter for other preprocedural examination: Secondary | ICD-10-CM | POA: Insufficient documentation

## 2012-03-26 DIAGNOSIS — M4120 Other idiopathic scoliosis, site unspecified: Secondary | ICD-10-CM | POA: Insufficient documentation

## 2012-03-26 DIAGNOSIS — E785 Hyperlipidemia, unspecified: Secondary | ICD-10-CM | POA: Insufficient documentation

## 2012-03-26 DIAGNOSIS — K219 Gastro-esophageal reflux disease without esophagitis: Secondary | ICD-10-CM | POA: Insufficient documentation

## 2012-03-26 MED ORDER — FUROSEMIDE 40 MG PO TABS: 40.0000 mg | ORAL_TABLET | Freq: Two times a day (BID) | ORAL | Status: DC

## 2012-03-26 MED ORDER — PROPRANOLOL HCL 10 MG PO TABS: 10.0000 mg | ORAL_TABLET | Freq: Two times a day (BID) | ORAL | Status: DC

## 2012-03-26 MED ORDER — CALCIUM-VITAMIN D 600-200 MG-UNIT PO TABS: 1.0000 | ORAL_TABLET | Freq: Two times a day (BID) | ORAL | Status: DC

## 2012-03-26 MED ORDER — CIPROFLOXACIN IN D5W 400 MG/200ML IV SOLN
400.0000 mg | Freq: Once | INTRAVENOUS | Status: AC
  Administered 2012-03-26: 400 mg via INTRAVENOUS
  Filled 2012-03-26: qty 200

## 2012-03-26 MED ORDER — FOLIC ACID 1 MG PO TABS: 1.0000 mg | ORAL_TABLET | Freq: Every day | ORAL | Status: DC

## 2012-03-26 MED ORDER — SPIRONOLACTONE 50 MG PO TABS: 50.0000 mg | ORAL_TABLET | Freq: Every day | ORAL | Status: DC

## 2012-03-26 MED ORDER — TRAZODONE HCL 50 MG PO TABS: 25.0000 mg | ORAL_TABLET | Freq: Every evening | ORAL | Status: DC | PRN

## 2012-03-26 MED ORDER — OXYCODONE-ACETAMINOPHEN 10-325 MG PO TABS: 1.0000 | ORAL_TABLET | ORAL | Status: DC | PRN

## 2012-03-26 MED ORDER — VITAMIN B-1 100 MG PO TABS: 100.0000 mg | ORAL_TABLET | Freq: Every day | ORAL | Status: DC

## 2012-03-26 MED ORDER — MIRTAZAPINE 15 MG PO TABS: 15.0000 mg | ORAL_TABLET | Freq: Every day | ORAL | Status: AC

## 2012-03-26 MED ORDER — ALBUMIN HUMAN 25 % IV SOLN
25.0000 g | Freq: Once | INTRAVENOUS | Status: AC
  Administered 2012-03-26: 25 g via INTRAVENOUS
  Filled 2012-03-26: qty 100

## 2012-03-26 MED ORDER — OMEPRAZOLE 20 MG PO CPDR: 20.0000 mg | DELAYED_RELEASE_CAPSULE | Freq: Every day | ORAL | Status: DC

## 2012-03-26 MED ORDER — LACTULOSE 10 GM/15ML PO SOLN: 10.0000 g | ORAL | Status: DC

## 2012-03-26 MED ORDER — MIRTAZAPINE 15 MG PO TABS: 15.0000 mg | ORAL_TABLET | Freq: Every day | ORAL | Status: DC

## 2012-03-26 NOTE — Progress Notes (Signed)
Family Medicine Teaching [facility] Discharge Summary  Patient name: Kathryn [name] Medical record number: [id number] Date of birth: 06-13-1950 Age: 61 y.o. Gender: female Date of Admission: 02/20/2012  Date of Discharge: 03/27/2012  Primary Care Provider: Willow Ora, MD  Indication for SNF Placement: Rehabilitation following left foot partial amputation for osteomyelitis Discharge Diagnoses:  Patient Active Problem List  Diagnosis  . HYPERLIPIDEMIA  . UNSPECIFIED MONONEURITIS OF UPPER LIMB  . ESSENTIAL HYPERTENSION  . GERD  . OSTEOARTHRITIS  . OSTEOPOROSIS  . SCOLIOSIS  . EtOH dependence  . Ascites  . Alcoholic liver disease  . Severe protein-calorie malnutrition  . Hypoalbuminemia  . Diarrhea  . Insomnia   Consultations: GI, Dr. Dulce Sellar; Ortho, Dr. Lajoyce Corners  Brief SNF Course: Patient was admitted and placed in rehab.  She did well with her rehabilitation exercises and was able to ambulate and perform all ADLs at the time of discharge.  During her time at the SNF, the patient did have some problems with elevated ammonia secondary to alcoholic liver disease.  She was initially placed on lactulose with good response but then elected to stop it due to diarrhea.  As her ammonia level began climbing again she was placed on a reduced dose of lactulose and was tolerating this well at the time of discharge.  Patient also noted to have significant ascites that has required paracentesis (thereputic) in the past.  She continues to follow with Dr. Dulce Sellar who assists in the management of this issue.  Discharge Medications:  Current Outpatient Prescriptions  Medication Sig Dispense Refill  . CALCIUM-VITAMIN D PO Take 1 tablet by mouth 2 (two) times daily.      . folic acid (FOLVITE) 1 MG tablet Take 1 tablet (1 mg total) by mouth daily.  30 tablet  0  . furosemide (LASIX) 40 MG tablet Take 1 tablet (40 mg total) by mouth 2 (two) times daily.  60 tablet  2  . lactulose (CHRONULAC) 10 GM/15ML  solution 10gm oral 3 times weekly on M, W, F      . mirtazapine (REMERON) 15 MG tablet Take 15 mg by mouth at bedtime.      Marland Kitchen omeprazole (PRILOSEC) 20 MG capsule Take 1 capsule (20 mg total) by mouth daily.  30 capsule  3  . oxyCODONE-acetaminophen (PERCOCET) 10-325 MG per tablet Take 1 tablet by mouth every 4 (four) hours as needed for pain. Maximum 4 tablets daily  40 tablet  0  . propranolol (INDERAL) 10 MG tablet Take 1 tablet (10 mg total) by mouth 2 (two) times daily.      Marland Kitchen spironolactone (ALDACTONE) 50 MG tablet Take 1 tablet (50 mg total) by mouth daily.  30 tablet  2  . thiamine (VITAMIN B-1) 100 MG tablet Take 1 tablet (100 mg total) by mouth daily.      . traZODone (DESYREL) 50 MG tablet Take 0.5 tablets (25 mg total) by mouth at bedtime as needed for sleep.  30 tablet  3   Issues for Follow Up: Continue to follow ammonia level and weight closely. Patient also has chronic pain complaints.  She is being discharged with a limited supply of Percocet 10/325 and will need to follow up with her new PCP and come to an agreement with him about appropriate long-term pain management.  Discharge Condition: Elliot Gault, MD 03/26/2012, 2:08 PM

## 2012-03-27 ENCOUNTER — Telehealth: Payer: Self-pay | Admitting: Family Medicine

## 2012-03-27 NOTE — Telephone Encounter (Signed)
[name] called emergency line about one of her patients. Kathryn [name] had paracentesis today at hospital. After procedure her BP was 84/64, and currently her BP 77/49. She has eaten fine and drank water since her procedure. She is asymptomatic and awake and talking. (sitting up writing cards at 2:00am) Will continue to monitor BP and recheck soon. If patient has any symptoms, she should be brought to ED for evaluation.   Will forward to Geriatric resident. Kathryn [name] should be followed closely and anti-hypertensive medications should be held for now.  Sweden Lesure M. Henny Strauch, M.D.

## 2012-03-27 NOTE — Progress Notes (Signed)
Interventional Radiology  I spoke with Mrs. Calandra on the phone today.  She had a limited US guided paracentesis two days ago and is having continued leaking from her puncture site. She had an incomplete paracentesis 2/2 her prior history of severe hypotension following complete drainage.   I asked her to come in to the radiology nurses station for evaluation.  She is draining clear ascites from her puncture site in the LLQ.  No signs of cellulitis or SBP.  I cleansed the area with chlorhexadine and applied a liberal amount of Dermabond to the area while maintaining skin apposition of the puncture site by manual tension.    If she leaks through this, she may need a purse string suture placed.  I warned her to keep alert for any signs and symptoms of SBP and to report to the ER immediately if she develops belly pain, fever or chills.  If she leaks, she will call us in IR to come in and have a suture placed.   Signed,  Sterling Big, MD Vascular & Interventional Radiologist [facility]

## 2012-03-28 ENCOUNTER — Non-Acute Institutional Stay: Payer: Self-pay | Admitting: Family Medicine

## 2012-03-28 ENCOUNTER — Telehealth: Payer: Self-pay | Admitting: Internal Medicine

## 2012-03-28 NOTE — Telephone Encounter (Signed)
Pharmacist calling, unable to locate Calcium D 600-200, she has 800-600 or 500-400. Called pharmacist back and let her know that he agreed to the 500-400.

## 2012-03-28 NOTE — Progress Notes (Signed)
Patient discharged.

## 2012-04-20 ENCOUNTER — Other Ambulatory Visit: Payer: Self-pay | Admitting: *Deleted

## 2012-04-20 DIAGNOSIS — F1020 Alcohol dependence, uncomplicated: Secondary | ICD-10-CM

## 2012-04-22 MED ORDER — VITAMIN B-1 100 MG PO TABS: 100.0000 mg | ORAL_TABLET | Freq: Every day | ORAL | Status: DC

## 2012-04-22 MED ORDER — OMEPRAZOLE 20 MG PO CPDR: 20.0000 mg | DELAYED_RELEASE_CAPSULE | Freq: Every day | ORAL | Status: DC

## 2012-04-22 MED ORDER — FOLIC ACID 1 MG PO TABS: 1.0000 mg | ORAL_TABLET | Freq: Every day | ORAL | Status: DC

## 2012-04-26 ENCOUNTER — Encounter (INDEPENDENT_AMBULATORY_CARE_PROVIDER_SITE_OTHER): Payer: Medicare Other | Admitting: Family Medicine

## 2012-04-26 ENCOUNTER — Other Ambulatory Visit: Payer: Self-pay | Admitting: *Deleted

## 2012-04-26 DIAGNOSIS — Z0000 Encounter for general adult medical examination without abnormal findings: Secondary | ICD-10-CM

## 2012-04-26 DIAGNOSIS — I10 Essential (primary) hypertension: Secondary | ICD-10-CM

## 2012-04-26 NOTE — Progress Notes (Signed)
  Subjective:    Patient ID: Kathryn [name], female    DOB: 10/06/1950, 62 y.o.   MRN: [id number]  HPI    Review of Systems     Objective:   Physical Exam        Assessment & Plan:  Alcoholic liver disease Hypertension  Home Health certification for PT was completed and sent back to [facility].

## 2012-07-26 ENCOUNTER — Other Ambulatory Visit: Payer: Self-pay | Admitting: Gastroenterology

## 2012-07-26 DIAGNOSIS — K7460 Unspecified cirrhosis of liver: Secondary | ICD-10-CM

## 2012-07-30 ENCOUNTER — Encounter (HOSPITAL_COMMUNITY): Payer: Self-pay | Admitting: *Deleted

## 2012-07-30 ENCOUNTER — Encounter (HOSPITAL_COMMUNITY): Payer: Self-pay

## 2012-07-31 ENCOUNTER — Ambulatory Visit
Admission: RE | Admit: 2012-07-31 | Discharge: 2012-07-31 | Disposition: A | Discharge status: Home / Self Care | Payer: Medicare Other | Source: Ambulatory Visit | Attending: Gastroenterology | Admitting: Gastroenterology

## 2012-07-31 DIAGNOSIS — K7460 Unspecified cirrhosis of liver: Secondary | ICD-10-CM

## 2012-08-14 ENCOUNTER — Other Ambulatory Visit: Payer: Self-pay | Admitting: Gastroenterology

## 2012-08-14 NOTE — Addendum Note (Signed)
Addended by: Masha Orbach on: 08/14/2012 01:30 PM   Modules accepted: Orders  

## 2012-08-15 ENCOUNTER — Ambulatory Visit (HOSPITAL_COMMUNITY): Admission: RE | Admit: 2012-08-15 | Payer: Medicare Other | Source: Ambulatory Visit | Admitting: Gastroenterology

## 2012-08-15 ENCOUNTER — Telehealth: Payer: Self-pay | Admitting: Oncology

## 2012-08-15 SURGERY — ESOPHAGOGASTRODUODENOSCOPY (EGD) WITH PROPOFOL
Anesthesia: Monitor Anesthesia Care

## 2012-08-15 NOTE — Telephone Encounter (Signed)
S/W PT IN RE TO NP APPT 05/23 @ 10:30 W/DR. MOHAMED REFERRING DR. Chrissie Noa OUTLAW DX-EVALUATION HYPERPROTEINEMIA AND MILD HYPERCALCEMIA WELCOME PACKET MAILED.

## 2012-08-20 ENCOUNTER — Other Ambulatory Visit: Payer: Self-pay | Admitting: Gastroenterology

## 2012-08-21 ENCOUNTER — Telehealth: Payer: Self-pay | Admitting: Oncology

## 2012-08-21 ENCOUNTER — Other Ambulatory Visit: Payer: Self-pay | Admitting: Gastroenterology

## 2012-08-21 NOTE — Addendum Note (Signed)
Addended by: Willis Modena on: 08/21/2012 09:21 AM   Modules accepted: Orders

## 2012-08-21 NOTE — Telephone Encounter (Signed)
C/D 08/21/12 for appt. 09/07/12 °

## 2012-08-22 ENCOUNTER — Ambulatory Visit (HOSPITAL_COMMUNITY)
Admission: RE | Admit: 2012-08-22 | Discharge: 2012-08-22 | Disposition: A | Discharge status: Home / Self Care | Payer: Medicare Other | Source: Ambulatory Visit | Attending: Gastroenterology | Admitting: Gastroenterology

## 2012-08-22 ENCOUNTER — Ambulatory Visit (HOSPITAL_COMMUNITY): Payer: Medicare Other | Admitting: Anesthesiology

## 2012-08-22 ENCOUNTER — Encounter (HOSPITAL_COMMUNITY): Payer: Self-pay | Admitting: Anesthesiology

## 2012-08-22 ENCOUNTER — Encounter (HOSPITAL_COMMUNITY)
Admission: RE | Disposition: A | Discharge status: Home / Self Care | Payer: Self-pay | Source: Ambulatory Visit | Attending: Gastroenterology

## 2012-08-22 ENCOUNTER — Encounter (HOSPITAL_COMMUNITY): Payer: Self-pay | Admitting: *Deleted

## 2012-08-22 DIAGNOSIS — K319 Disease of stomach and duodenum, unspecified: Secondary | ICD-10-CM | POA: Insufficient documentation

## 2012-08-22 DIAGNOSIS — K766 Portal hypertension: Secondary | ICD-10-CM | POA: Insufficient documentation

## 2012-08-22 DIAGNOSIS — K2940 Chronic atrophic gastritis without bleeding: Secondary | ICD-10-CM | POA: Insufficient documentation

## 2012-08-22 DIAGNOSIS — I8510 Secondary esophageal varices without bleeding: Secondary | ICD-10-CM | POA: Insufficient documentation

## 2012-08-22 DIAGNOSIS — K449 Diaphragmatic hernia without obstruction or gangrene: Secondary | ICD-10-CM | POA: Insufficient documentation

## 2012-08-22 DIAGNOSIS — K7460 Unspecified cirrhosis of liver: Secondary | ICD-10-CM | POA: Insufficient documentation

## 2012-08-22 HISTORY — PX: ESOPHAGOGASTRODUODENOSCOPY (EGD) WITH PROPOFOL: SHX5813

## 2012-08-22 SURGERY — ESOPHAGOGASTRODUODENOSCOPY (EGD) WITH PROPOFOL
Anesthesia: Monitor Anesthesia Care

## 2012-08-22 MED ORDER — BUTAMBEN-TETRACAINE-BENZOCAINE 2-2-14 % EX AERO
INHALATION_SPRAY | CUTANEOUS | Status: DC | PRN
  Administered 2012-08-22: 2 via TOPICAL

## 2012-08-22 MED ORDER — FENTANYL CITRATE 0.05 MG/ML IJ SOLN
INTRAMUSCULAR | Status: DC | PRN
  Administered 2012-08-22 (×2): 50 ug via INTRAVENOUS

## 2012-08-22 MED ORDER — MIDAZOLAM HCL 5 MG/5ML IJ SOLN
INTRAMUSCULAR | Status: DC | PRN
  Administered 2012-08-22: 2 mg via INTRAVENOUS

## 2012-08-22 MED ORDER — FENTANYL CITRATE 0.05 MG/ML IJ SOLN: 25.0000 ug | INTRAMUSCULAR | Status: DC | PRN

## 2012-08-22 MED ORDER — PROPOFOL 10 MG/ML IV EMUL
INTRAVENOUS | Status: DC | PRN
  Administered 2012-08-22: 100 ug/kg/min via INTRAVENOUS

## 2012-08-22 MED ORDER — LACTATED RINGERS IV SOLN
INTRAVENOUS | Status: DC
  Administered 2012-08-22: 11:00:00 via INTRAVENOUS

## 2012-08-22 SURGICAL SUPPLY — 14 items

## 2012-08-22 NOTE — H&P (Signed)
Patient interval history reviewed.  Patient examined again.  There has been no change from documented H/P dated 07/26/12 (scanned into chart from our office) except as documented above.  Assessment:  1.  Cirrhosis with history small distal esophageal varices.  Plan:  1. Endoscopy for esophageal variceal surveillance and screening for gastric varices. 2.  Risks (bleeding, infection, bowel perforation that could require surgery, sedation-related changes in cardiopulmonary systems), benefits (identification and possible treatment of source of symptoms, exclusion of certain causes of symptoms), and alternatives (watchful waiting, radiographic imaging studies, empiric medical treatment) of upper endoscopy (EGD) were explained to patient in detail and patient wishes to proceed.

## 2012-08-22 NOTE — Transfer of Care (Signed)
Immediate Anesthesia Transfer of Care Note  Patient: Kathryn [name]  Procedure(s) Performed: Procedure(s): ESOPHAGOGASTRODUODENOSCOPY (EGD) WITH PROPOFOL (N/A)  Patient Location: PACU  Anesthesia Type:MAC  Level of Consciousness: awake, alert  and oriented  Airway & Oxygen Therapy: Patient Spontanous Breathing and Patient connected to nasal cannula oxygen  Post-op Assessment: Report given to PACU RN and Post -op Vital signs reviewed and stable  Post vital signs: Reviewed and stable  Complications: No apparent anesthesia complications

## 2012-08-22 NOTE — Op Note (Signed)
[facility] [address] [place], [address]   ENDOSCOPY PROCEDURE REPORT  PATIENT: [name], Kathryn [name]  MR#: [id number] BIRTHDATE: 1951/01/10 , 61  yrs. old GENDER: Female ENDOSCOPIST: Willis Modena, MD REFERRED BY:  Lupe Carney, M.D. PROCEDURE DATE:  08/22/2012 PROCEDURE:  EGD, diagnostic ASA CLASS:     Class III INDICATIONS:  cirrhosis, esophageal variceal surveillance. MEDICATIONS: MAC sedation, administered by CRNA TOPICAL ANESTHETIC: Cetacaine Spray  DESCRIPTION OF PROCEDURE: After the risks benefits and alternatives of the procedure were thoroughly explained, informed consent was obtained.  The diagnostic forward-viewing endoscope was introduced through the mouth and advanced to the second portion of the duodenum. Without limitations.  The instrument was slowly withdrawn as the mucosa was fully examined.    Findings:  Small hiatal hernia.  Two columns of very small (Grade I) distal esophageal varices, without stigmata of bleeding.  Mild diffuse atrophic-appearing gastritis with very mild proximal-predominant portal hypertensive gastropathy. Retroflexion into cardia showed no evidence of gastric varices. Otherwise normal endoscopy to the second portion of the duodenum.         The scope was  then withdrawn from the patient and the procedure completed.  ENDOSCOPIC IMPRESSION:     As above.  Very small distal esophageal varices.  RECOMMENDATIONS:     1.  Watch for potential complications of procedure. 2.  Continue propranolol 10 mg bid. 3.  Continue other current medications. 4.  No need for further surveillance endoscopies. 5.  Follow-up with [facility] in 6 months.  eSigned:  Willis Modena, MD 08/22/2012 11:36 AM   CC:

## 2012-08-22 NOTE — Anesthesia Preprocedure Evaluation (Addendum)
Anesthesia Evaluation  Patient identified by MRN, date of birth, ID band Patient awake    Reviewed: Allergy & Precautions, H&P , NPO status , Patient's Chart, lab work & pertinent test results, reviewed documented beta blocker date and time   Airway Mallampati: II TM Distance: >3 FB Neck ROM: full    Dental  (+) Missing and Dental Advisory Given 4 upper front teeth missing:   Pulmonary shortness of breath and with exertion,  breath sounds clear to auscultation  Pulmonary exam normal       Cardiovascular Exercise Tolerance: Poor hypertension, Pt. on home beta blockers Rhythm:regular Rate:Normal  Irregular hb   Neuro/Psych negative neurological ROS  negative psych ROS   GI/Hepatic negative GI ROS, (+) Cirrhosis -  ascites  substance abuse  alcohol use,   Endo/Other  negative endocrine ROS  Renal/GU negative Renal ROS  negative genitourinary   Musculoskeletal   Abdominal   Peds  Hematology negative hematology ROS (+)   Anesthesia Other Findings   Reproductive/Obstetrics negative OB ROS                          Anesthesia Physical Anesthesia Plan  ASA: III  Anesthesia Plan: MAC   Post-op Pain Management:    Induction:   Airway Management Planned: Simple Face Mask  Additional Equipment:   Intra-op Plan:   Post-operative Plan:   Informed Consent: I have reviewed the patients History and Physical, chart, labs and discussed the procedure including the risks, benefits and alternatives for the proposed anesthesia with the patient or authorized representative who has indicated his/her understanding and acceptance.   Dental Advisory Given  Plan Discussed with: Surgeon  Anesthesia Plan Comments:         Anesthesia Quick Evaluation

## 2012-08-22 NOTE — Anesthesia Postprocedure Evaluation (Signed)
  Anesthesia Post-op Note  Patient: Kathryn [name]  Procedure(s) Performed: Procedure(s) (LRB): ESOPHAGOGASTRODUODENOSCOPY (EGD) WITH PROPOFOL (N/A)  Patient Location: PACU  Anesthesia Type: MAC  Level of Consciousness: awake and alert   Airway and Oxygen Therapy: Patient Spontanous Breathing  Post-op Pain: mild  Post-op Assessment: Post-op Vital signs reviewed, Patient's Cardiovascular Status Stable, Respiratory Function Stable, Patent Airway and No signs of Nausea or vomiting  Last Vitals:  Filed Vitals:   08/22/12 1200  BP: 97/70  Pulse:   Temp:   Resp: 16    Post-op Vital Signs: stable   Complications: No apparent anesthesia complications

## 2012-08-23 ENCOUNTER — Encounter (HOSPITAL_COMMUNITY): Payer: Self-pay | Admitting: Gastroenterology

## 2012-08-31 ENCOUNTER — Other Ambulatory Visit: Payer: Self-pay | Admitting: Oncology

## 2012-08-31 DIAGNOSIS — D729 Disorder of white blood cells, unspecified: Secondary | ICD-10-CM

## 2012-09-07 ENCOUNTER — Ambulatory Visit: Payer: Medicare Other

## 2012-09-07 ENCOUNTER — Ambulatory Visit: Payer: Medicare Other | Admitting: Oncology

## 2012-09-07 ENCOUNTER — Other Ambulatory Visit: Payer: Medicare Other | Admitting: Lab

## 2012-12-28 ENCOUNTER — Telehealth: Payer: Self-pay | Admitting: Oncology

## 2012-12-28 NOTE — Telephone Encounter (Signed)
C/D 12/28/12 for appt. 01/17/13

## 2012-12-28 NOTE — Telephone Encounter (Signed)
LEFT VM TO REFERRING OFFICE TO CALL PT IN REF TO NP APPT. 01/17/13@10 :30 REFERRING DR Earney Mallet FOR MULTIPLE MYELOMA MAILED NP PACKET

## 2013-01-11 ENCOUNTER — Other Ambulatory Visit: Payer: Self-pay | Admitting: Oncology

## 2013-01-11 DIAGNOSIS — D729 Disorder of white blood cells, unspecified: Secondary | ICD-10-CM

## 2013-01-17 ENCOUNTER — Ambulatory Visit: Payer: Medicare Other | Admitting: Oncology

## 2013-01-17 ENCOUNTER — Ambulatory Visit: Payer: Medicare Other

## 2013-01-17 ENCOUNTER — Other Ambulatory Visit: Payer: Medicare Other | Admitting: Lab

## 2013-01-28 ENCOUNTER — Other Ambulatory Visit: Payer: Self-pay | Admitting: Gastroenterology

## 2013-01-28 DIAGNOSIS — K7460 Unspecified cirrhosis of liver: Secondary | ICD-10-CM

## 2013-02-01 ENCOUNTER — Other Ambulatory Visit: Payer: Self-pay | Admitting: Oncology

## 2013-02-01 ENCOUNTER — Ambulatory Visit
Admission: RE | Admit: 2013-02-01 | Discharge: 2013-02-01 | Disposition: A | Discharge status: Home / Self Care | Payer: Medicare Other | Source: Ambulatory Visit | Attending: Gastroenterology | Admitting: Gastroenterology

## 2013-02-01 DIAGNOSIS — K7460 Unspecified cirrhosis of liver: Secondary | ICD-10-CM

## 2013-02-01 DIAGNOSIS — D539 Nutritional anemia, unspecified: Secondary | ICD-10-CM

## 2013-02-06 ENCOUNTER — Ambulatory Visit (HOSPITAL_BASED_OUTPATIENT_CLINIC_OR_DEPARTMENT_OTHER): Payer: Medicare Other | Admitting: Oncology

## 2013-02-06 ENCOUNTER — Other Ambulatory Visit (HOSPITAL_BASED_OUTPATIENT_CLINIC_OR_DEPARTMENT_OTHER): Payer: Medicare Other | Admitting: Lab

## 2013-02-06 ENCOUNTER — Encounter (INDEPENDENT_AMBULATORY_CARE_PROVIDER_SITE_OTHER): Payer: Self-pay

## 2013-02-06 ENCOUNTER — Ambulatory Visit (HOSPITAL_COMMUNITY)
Admission: RE | Admit: 2013-02-06 | Discharge: 2013-02-06 | Disposition: A | Discharge status: Home / Self Care | Payer: Medicare Other | Source: Ambulatory Visit | Attending: Oncology | Admitting: Oncology

## 2013-02-06 ENCOUNTER — Ambulatory Visit: Payer: Medicare Other

## 2013-02-06 ENCOUNTER — Telehealth: Payer: Self-pay | Admitting: *Deleted

## 2013-02-06 VITALS — BP 104/74 | HR 95 | Temp 98.7°F | Resp 20 | Ht 61.0 in | Wt 110.3 lb

## 2013-02-06 DIAGNOSIS — D7289 Other specified disorders of white blood cells: Secondary | ICD-10-CM | POA: Insufficient documentation

## 2013-02-06 DIAGNOSIS — R799 Abnormal finding of blood chemistry, unspecified: Secondary | ICD-10-CM

## 2013-02-06 DIAGNOSIS — D729 Disorder of white blood cells, unspecified: Secondary | ICD-10-CM

## 2013-02-06 DIAGNOSIS — M4120 Other idiopathic scoliosis, site unspecified: Secondary | ICD-10-CM | POA: Insufficient documentation

## 2013-02-06 DIAGNOSIS — E8809 Other disorders of plasma-protein metabolism, not elsewhere classified: Secondary | ICD-10-CM

## 2013-02-06 DIAGNOSIS — Z96649 Presence of unspecified artificial hip joint: Secondary | ICD-10-CM | POA: Insufficient documentation

## 2013-02-06 DIAGNOSIS — K769 Liver disease, unspecified: Secondary | ICD-10-CM

## 2013-02-06 DIAGNOSIS — D539 Nutritional anemia, unspecified: Secondary | ICD-10-CM

## 2013-02-06 LAB — IRON AND TIBC CHCC
Iron: 158 ug/dL — ABNORMAL HIGH (ref 41–142)
TIBC: 300 ug/dL (ref 236–444)

## 2013-02-06 LAB — CBC WITH DIFFERENTIAL/PLATELET
BASO%: 1.3 % (ref 0.0–2.0)
Basophils Absolute: 0.1 10*3/uL (ref 0.0–0.1)
EOS%: 1.9 % (ref 0.0–7.0)
HCT: 36.8 % (ref 34.8–46.6)
HGB: 12.3 g/dL (ref 11.6–15.9)
LYMPH%: 23.4 % (ref 14.0–49.7)
MCH: 33 pg (ref 25.1–34.0)
MCHC: 33.4 g/dL (ref 31.5–36.0)
MONO#: 0.7 10*3/uL (ref 0.1–0.9)
NEUT%: 61.2 % (ref 38.4–76.8)
RBC: 3.72 10*6/uL (ref 3.70–5.45)
WBC: 5.5 10*3/uL (ref 3.9–10.3)
lymph#: 1.3 10*3/uL (ref 0.9–3.3)

## 2013-02-06 LAB — COMPREHENSIVE METABOLIC PANEL (CC13)
ALT: 20 U/L (ref 0–55)
AST: 41 U/L — ABNORMAL HIGH (ref 5–34)
Albumin: 3 g/dL — ABNORMAL LOW (ref 3.5–5.0)
Anion Gap: 10 mEq/L (ref 3–11)
BUN: 12.2 mg/dL (ref 7.0–26.0)
CO2: 25 mEq/L (ref 22–29)
Calcium: 9.2 mg/dL (ref 8.4–10.4)
Chloride: 101 mEq/L (ref 98–109)
Creatinine: 1.1 mg/dL (ref 0.6–1.1)
Potassium: 4 mEq/L (ref 3.5–5.1)
Total Bilirubin: 0.75 mg/dL (ref 0.20–1.20)

## 2013-02-06 NOTE — Progress Notes (Signed)
Please see consult note.  

## 2013-02-06 NOTE — Consult Note (Signed)
Reason for Referral: Elevated protein and evaluation for a plasma cell disorder.   HPI: This is a pleasant 62 year old woman referred to me for the evaluation of a possible plasma cell disorder. She does have a past medical history of alcoholic liver disease and cirrhosis of the liver. And she has been followed by Dr. Dulce Sellar and for the most part her liver disease have been relatively stable. On a routine laboratory data she was noted to have a persistently high protein levels ranging between 9.2 and 8.6. She also was noted to have an elevated calcium of 10.7 with the upper limited of normal of 10.3. She had a serum protein electrophoresis which showed she has no M spike noted. She did have an elevation in both beta and gamma globulins. She had a normal hemoglobin and hematocrit. She also has normal kidney function.  Clinically, she is asymptomatic from these findings. She has not reported any recent illnesses or hospitalizations. She has not reported any fevers or chills or sweats. Is not reporting any cough or hemoptysis or hematemesis. Is a report any musculoskeletal plain arthralgias or myalgias or fractures. She has not reported any recurrent sinopulmonary infections. She does not report any confusion or change in her mental status. She does not report any bleeding or hematochezia or melena. This report any epistaxis or any recent hospitalizations. She continue to live independently and perform activities of daily living without any hindrance or decline.   Past Medical History  Diagnosis Date  . Hypertension   . Osteoporosis   . Alcoholic liver disease   . High cholesterol   . Ascites 09/22/11    "first time for me"  . Pancreatitis   . History of blood transfusion 07/2011    "when I had elbow surgery"  . GERD (gastroesophageal reflux disease)   . Staph infection     left foot  . Fracture, humerus 07/2011    right  . Dysrhythmia     "irregular"  . Shortness of breath 09/22/11    "because of  all the fluid that's built up in my stomach"  . Arthritis     "all over"  . Peripheral vascular disease   :  Past Surgical History  Procedure Laterality Date  . Amputation      two toes on left foot  . Bunionectomy      both feet  . Arthroscopic repair acl      right knee  . Orif humerus fracture  07/26/2011    Procedure: OPEN REDUCTION INTERNAL FIXATION (ORIF) DISTAL HUMERUS FRACTURE;  Surgeon: Budd Palmer, MD;  Location: [facility] OR;  Service: Orthopedics;  Laterality: Right;  . Total hip arthroplasty  ~ 2003    right  . Tubal ligation  1980  . Paracentesis  09/22/11    2.2L  . Esophagogastroduodenoscopy  09/23/2011    Procedure: ESOPHAGOGASTRODUODENOSCOPY (EGD);  Surgeon: Shirley Friar, MD;  Location: [facility] ENDOSCOPY;  Service: Endoscopy;  Laterality: N/A;  . Transmetatarsal amputation  02/16/2012    Procedure: TRANSMETATARSAL AMPUTATION;  Surgeon: Nadara Mustard, MD;  Location: [facility] OR;  Service: Orthopedics;  Laterality: Left;  . Fracture surgery  2013    Right elbow  . Joint replacement      right hip   . Esophagogastroduodenoscopy (egd) with propofol N/A 08/22/2012    Procedure: ESOPHAGOGASTRODUODENOSCOPY (EGD) WITH PROPOFOL;  Surgeon: Willis Modena, MD;  Location: [facility] ENDOSCOPY;  Service: Endoscopy;  Laterality: N/A;  :  Current Outpatient Prescriptions  Medication Sig Dispense  Refill  . Calcium-Vitamin D 600-200 MG-UNIT per tablet Take 1 tablet by mouth 2 (two) times daily.  60 tablet  0  . folic acid (FOLVITE) 1 MG tablet Take 1 tablet (1 mg total) by mouth daily.  30 tablet  0  . furosemide (LASIX) 40 MG tablet Take 1 tablet (40 mg total) by mouth 2 (two) times daily.  60 tablet  2  . lactulose (CHRONULAC) 10 GM/15ML solution Take 15 mLs (10 g total) by mouth 3 (three) times a week. 10gm oral 3 times weekly on M, W, F  960 mL  0  . mirtazapine (REMERON) 15 MG tablet Take 1 tablet (15 mg total) by mouth at bedtime.  30 tablet  0  . oxyCODONE-acetaminophen (PERCOCET) 10-325 MG  per tablet Take 1 tablet by mouth every 4 (four) hours as needed for pain. Maximum 4 tablets daily  40 tablet  0  . propranolol (INDERAL) 10 MG tablet Take 1 tablet (10 mg total) by mouth 2 (two) times daily.  60 tablet  0  . spironolactone (ALDACTONE) 50 MG tablet Take 50 mg by mouth 2 (two) times daily.      Marland Kitchen thiamine (VITAMIN B-1) 100 MG tablet Take 1 tablet (100 mg total) by mouth daily.  30 tablet  0  . traZODone (DESYREL) 50 MG tablet Take 0.5 tablets (25 mg total) by mouth at bedtime as needed for sleep.  30 tablet  0  . [DISCONTINUED] CALCIUM-VITAMIN D PO Take 1 tablet by mouth 2 (two) times daily.       No current facility-administered medications for this visit.       No Known Allergies:  No family history on file: Her father had stomach cancer and her brother had cirrhosis of the liver but no other history of any malignancies or any blood disorders.  History   Social History  . Marital Status: Widowed    Spouse Name: N/A    Number of Children: N/A  . Years of Education: N/A   Occupational History  . Not on file.   Social History Main Topics  . Smoking status: Never Smoker   . Smokeless tobacco: Never Used  . Alcohol Use: No     Comment: "last drink was day before OR in 07/2011"  . Drug Use: No  . Sexual Activity: Not Currently   Other Topics Concern  . Not on file   Social History Narrative   Widowed 2013   2 children in the area  :  Pertinent items are noted in HPI.  Exam: ECOG 1 Blood pressure 104/74, pulse 95, temperature 98.7 F (37.1 C), temperature source Oral, resp. rate 20, height 5\' 1"  (1.549 m), weight 110 lb 4.8 oz (50.032 kg). General appearance: alert, cooperative and appears stated age Head: Normocephalic, without obvious abnormality, atraumatic Throat: lips, mucosa, and tongue normal; teeth and gums normal Neck: no adenopathy, no carotid bruit, no JVD, supple, symmetrical, trachea midline and thyroid not enlarged, symmetric, no  tenderness/mass/nodules Back: negative Resp: clear to auscultation bilaterally Chest wall: no tenderness Cardio: regular rate and rhythm, S1, S2 normal, no murmur, click, rub or gallop GI: soft, non-tender; bowel sounds normal; no masses,  no organomegaly. No ascites noted. Extremities: extremities normal, atraumatic, no cyanosis or edema Pulses: 2+ and symmetric Skin: Skin color, texture, turgor normal. No rashes or lesions Lymph nodes: Cervical, supraclavicular, and axillary nodes normal.   Recent Labs  02/06/13 1336  WBC 5.5  HGB 12.3  HCT 36.8  PLT  195      Assessment and Plan:   62 year old woman with the following issues:  1. Elevated protein detected by routine laboratory testing in April of 2014 as well as in August of 2014. Her protein level dropped down from 9.2-8.6 with the upper limit of normal of around 8.3. Serum protein electrophoresis did not show any elevation of a monoclonal protein. The differential diagnosis was discussed today with Ms. Ravan which includes reactive elevation of her gamma and beta globulins due to liver disease. That is usually manifested with a polyclonal elevation which is more consistent with what we are seeing in her. I think a plasma cell disorder is unlikely given the fact that we are not dealing with a monoclonal protein rather than a polyclonal presentation. Her hemoglobin, kidney function all indicate less likely we are dealing with a plasma cell disorder. For completeness sake, I will repeat her serum protein electrophoresis, quantitative immunoglobulins and serum light chains to confirm the presence of a polyclonal elevation of her protein and that will support likely reactive finding rather than a malignant disorder.  2. Hypercalcemia: This could be related to her calcium supplements which he is taking around 1200 mg a day and certainly can explain her hypercalcemia for completeness sake, I will obtain a skeletal survey to rule out any  lytic bony lesions but if none noted that I would recommend decreasing her oral calcium supplements as it might be contributing to her hypercalcemia.  All her questions are answered today and we will arrange followup depending on these results at this point.

## 2013-02-06 NOTE — Telephone Encounter (Signed)
Pt is aware of her xray....td

## 2013-02-07 ENCOUNTER — Telehealth: Payer: Self-pay | Admitting: *Deleted

## 2013-02-07 NOTE — Telephone Encounter (Signed)
Called patient to let her know her bone scan was normal

## 2013-02-07 NOTE — Telephone Encounter (Signed)
Message copied by Reesa Chew on Thu Feb 07, 2013  8:57 AM ------      Message from: Benjiman Core      Created: Thu Feb 07, 2013  8:28 AM       Please call her xray results: All normal. ------

## 2013-02-08 LAB — KAPPA/LAMBDA LIGHT CHAINS: Kappa:Lambda Ratio: 0.94 (ref 0.26–1.65)

## 2013-02-11 LAB — SPEP & IFE WITH QIG

## 2013-02-12 ENCOUNTER — Telehealth: Payer: Self-pay | Admitting: *Deleted

## 2013-02-12 NOTE — Telephone Encounter (Signed)
Spoke with patient, told her all of her labs look good and there is no need for a follow up

## 2013-02-12 NOTE — Telephone Encounter (Signed)
Message copied by Reesa Chew on Tue Feb 12, 2013  8:48 AM ------      Message from: Benjiman Core      Created: Tue Feb 12, 2013  8:44 AM       Please call her and let her know that ALL her labs look good including her calcium.       No need to follow up with [place]. ------

## 2013-05-28 IMAGING — CR DG ELBOW 2V*R*
2 series · 2 of 2 positions shown · non-contrast
Comparison: None.

CLINICAL DATA: Pain post fall

RIGHT ELBOW - 2 VIEW

[x elbow lat right]
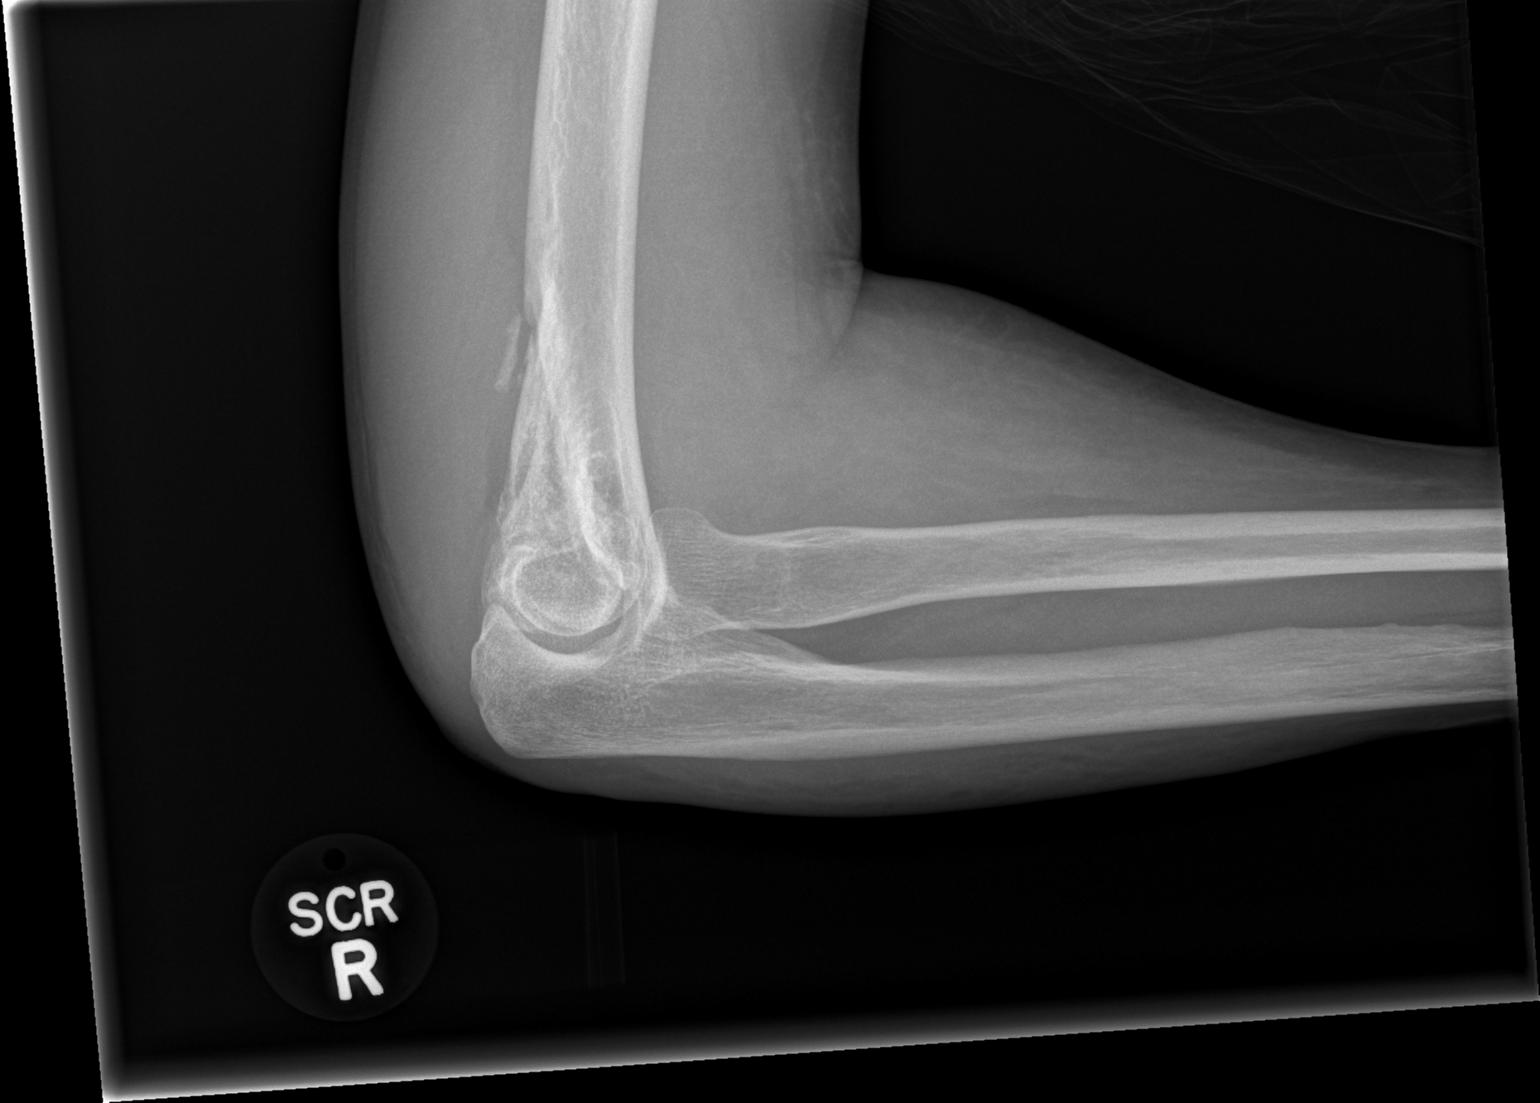

[x elbow ap right]
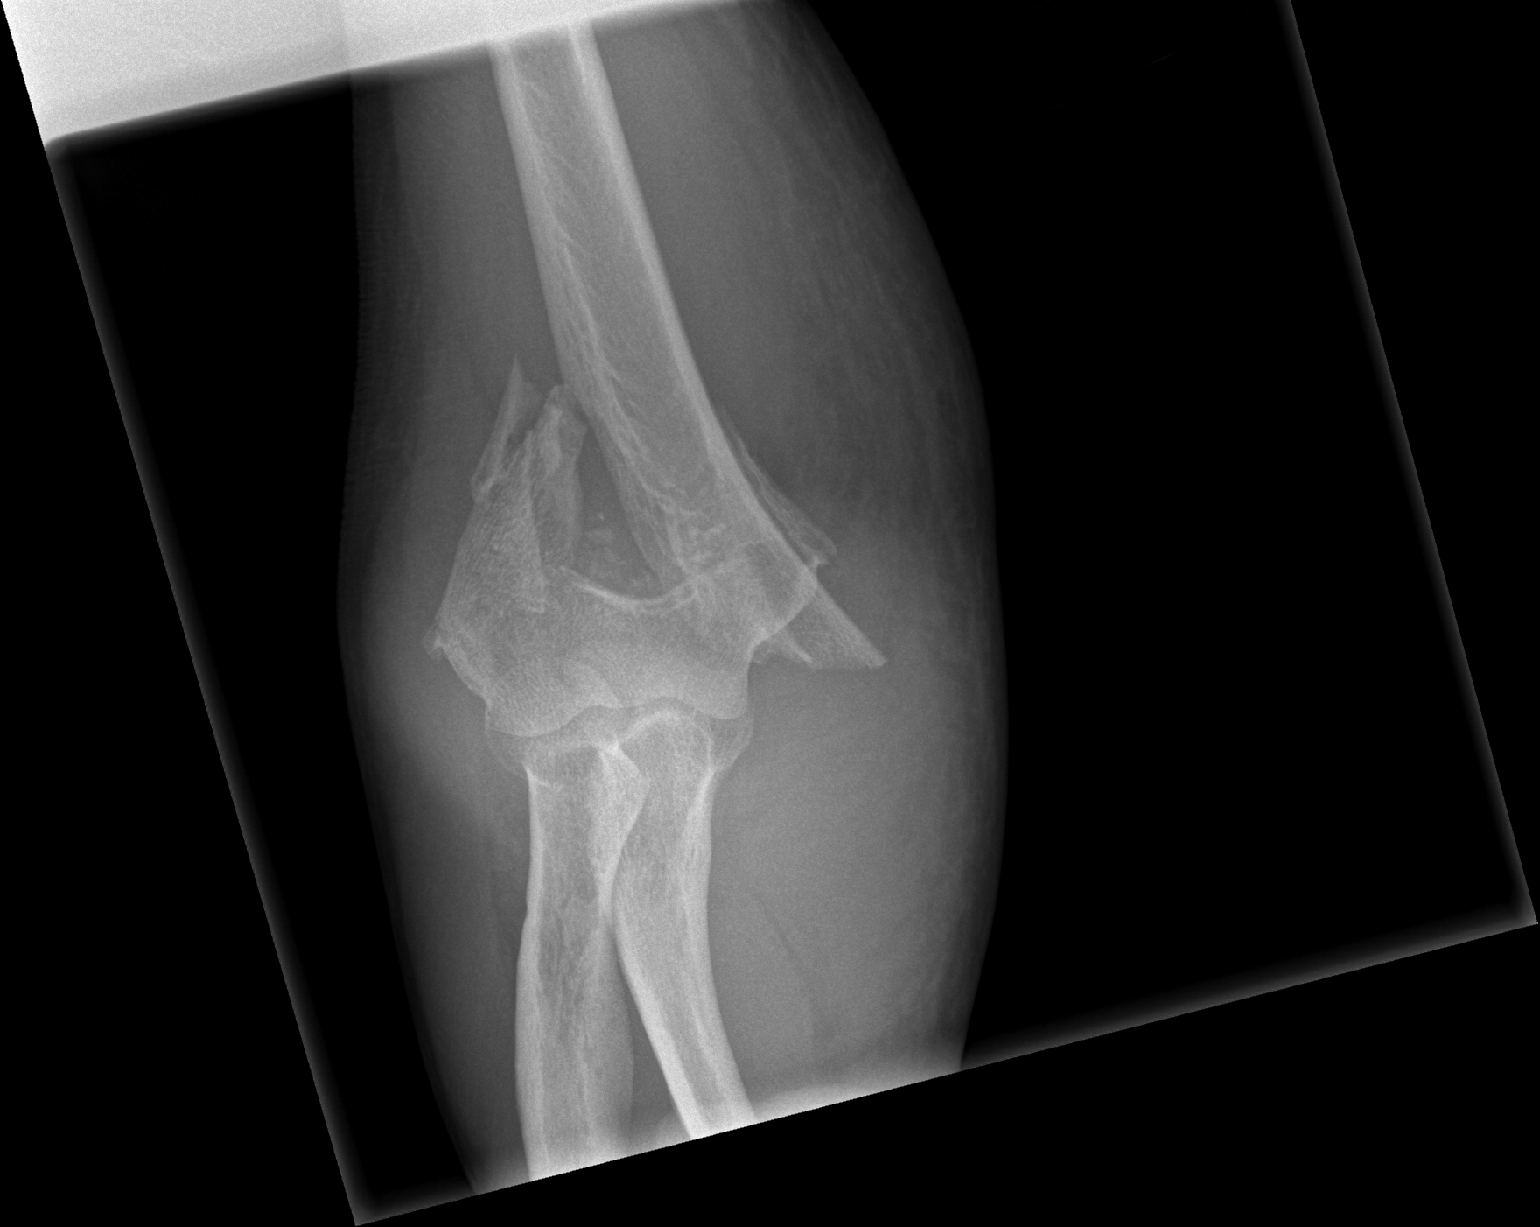

[2 of 2 positions shown; findings below may reference images not displayed]

FINDINGS: Two views of the right elbow submitted.  There is oblique
displaced supracondylar fracture in distal right humerus.
Posterior fat pad sign is noted probable due to joint effusion.
IMPRESSION: Oblique displaced fracture in distal right humerus.

## 2013-06-26 ENCOUNTER — Emergency Department (HOSPITAL_COMMUNITY)
Admission: EM | Admit: 2013-06-26 | Discharge: 2013-06-26 | Disposition: A | Discharge status: Home / Self Care | Payer: Medicare Other | Attending: Emergency Medicine | Admitting: Emergency Medicine

## 2013-06-26 ENCOUNTER — Encounter (HOSPITAL_COMMUNITY): Payer: Self-pay | Admitting: Emergency Medicine

## 2013-06-26 ENCOUNTER — Emergency Department (HOSPITAL_COMMUNITY): Payer: Medicare Other

## 2013-06-26 DIAGNOSIS — Z8639 Personal history of other endocrine, nutritional and metabolic disease: Secondary | ICD-10-CM | POA: Insufficient documentation

## 2013-06-26 DIAGNOSIS — Y9389 Activity, other specified: Secondary | ICD-10-CM | POA: Insufficient documentation

## 2013-06-26 DIAGNOSIS — S32509A Unspecified fracture of unspecified pubis, initial encounter for closed fracture: Secondary | ICD-10-CM | POA: Insufficient documentation

## 2013-06-26 DIAGNOSIS — W010XXA Fall on same level from slipping, tripping and stumbling without subsequent striking against object, initial encounter: Secondary | ICD-10-CM | POA: Insufficient documentation

## 2013-06-26 DIAGNOSIS — I10 Essential (primary) hypertension: Secondary | ICD-10-CM | POA: Insufficient documentation

## 2013-06-26 DIAGNOSIS — M129 Arthropathy, unspecified: Secondary | ICD-10-CM | POA: Insufficient documentation

## 2013-06-26 DIAGNOSIS — K219 Gastro-esophageal reflux disease without esophagitis: Secondary | ICD-10-CM | POA: Insufficient documentation

## 2013-06-26 DIAGNOSIS — S329XXA Fracture of unspecified parts of lumbosacral spine and pelvis, initial encounter for closed fracture: Secondary | ICD-10-CM

## 2013-06-26 DIAGNOSIS — Z96649 Presence of unspecified artificial hip joint: Secondary | ICD-10-CM | POA: Insufficient documentation

## 2013-06-26 DIAGNOSIS — Z79899 Other long term (current) drug therapy: Secondary | ICD-10-CM | POA: Insufficient documentation

## 2013-06-26 DIAGNOSIS — M810 Age-related osteoporosis without current pathological fracture: Secondary | ICD-10-CM | POA: Insufficient documentation

## 2013-06-26 DIAGNOSIS — Z8619 Personal history of other infectious and parasitic diseases: Secondary | ICD-10-CM | POA: Insufficient documentation

## 2013-06-26 DIAGNOSIS — Y929 Unspecified place or not applicable: Secondary | ICD-10-CM | POA: Insufficient documentation

## 2013-06-26 DIAGNOSIS — Z862 Personal history of diseases of the blood and blood-forming organs and certain disorders involving the immune mechanism: Secondary | ICD-10-CM | POA: Insufficient documentation

## 2013-06-26 MED ORDER — OXYCODONE-ACETAMINOPHEN 5-325 MG PO TABS
2.0000 | ORAL_TABLET | Freq: Once | ORAL | Status: AC
  Administered 2013-06-26: 2 via ORAL
  Filled 2013-06-26: qty 2

## 2013-06-26 MED ORDER — OXYCODONE-ACETAMINOPHEN 5-325 MG PO TABS: 1.0000 | ORAL_TABLET | ORAL | Status: DC | PRN

## 2013-06-26 NOTE — Discharge Instructions (Signed)
Use your walker when you get up to walk.  See your Orthopedist for a check up in 1 week, and as needed.   Stable Pelvic Fracture, Adult You have one or more fractures (this means there is a break in the bones) of the pelvis. The pelvis is the ring of bones that make up your hipbones. These are the bones you sit on and the lower part of the spine. It is like a boney ring where your legs attach and which supports your upper body. You have an un-displaced fracture. This means the bones are in good position. The pelvic fracture you have is a simple (uncomplicated) fracture. DIAGNOSIS  X-rays usually diagnose these fractures. TREATMENT  The goals of treating pelvic fractures are to get the bones to heal in a good position. The patient should return to normal activities as soon as possible. Such fractures are often treated with normal bed rest and conservative measures.  HOME CARE INSTRUCTIONS   You should be on bed rest for as long as directed by your caregiver. Change positions of your legs every 1-2 hours to maintain good blood flow. You may sit as long as is tolerable. Following this, you may do usual activities, but avoid strenuous activities for as long as directed by your caregiver.  Only take over-the-counter or prescription medicines for pain, discomfort, or fever as directed by your caregiver.  Bed-rest may also be used for discomfort.  Resume your activities when you are able. Use a cain or crutch on the injured side to reduce pain while walking, as needed.  If you develop increased pain or discomfort not relieved with medications, contact your caregiver.  Warning: Do not drive a car or operate a motor vehicle until your caregiver specifically tells you it is safe to do so. SEEK IMMEDIATE MEDICAL CARE IF:   You feel light-headed or faint, develop chest pain or shortness of breath.  An unexplained oral temperature above 102 F (38.9 C) develops.  You develop blood in the urine or in  the stools.  There is difficulty urinating, and/or having a bowel movement, or pain with these efforts.  There is a difficulty or increased pain with walking.  There is swelling in one or both legs that is not normal. Document Released: 06/13/2001 Document Revised: 12/05/2012 Document Reviewed: 11/16/2007 [facility] Patient Information 2014 [place], [place].

## 2013-06-26 NOTE — ED Notes (Signed)
Pt from home by ems. Fell last night after tripping over her left foot. Her only complaint here is with left hip pain.  sts she is unable to stand or put pressure on her left leg. Pt sts that she falls frequently d/t her left foot being partially amputated.

## 2013-06-26 NOTE — ED Notes (Signed)
Pt dc to home. Pt sts understanding to dc instructions. Pt taken to car via w/c. nadn.

## 2013-06-26 NOTE — ED Provider Notes (Signed)
CSN: [id number]     Arrival date & time 06/26/13  1203 History   First MD Initiated Contact with Patient 06/26/13 1211     Chief Complaint  Patient presents with  . Hip Pain     (Consider location/radiation/quality/duration/timing/severity/associated sxs/prior Treatment) Patient is a 63 y.o. female presenting with hip pain. The history is provided by the patient.  Hip Pain   She fell yesterday, when bending over to pick up a article of clothing. She feels like her right foot disability caused her to be off balance and fall. She denies injuries to head, neck, back, arms or legs. She has increased pain in the left groin when moving the left leg. She is able to bear weight, on both feet, now. No recent illnesses. No other known modifying factors.   Past Medical History  Diagnosis Date  . Hypertension   . Osteoporosis   . Alcoholic liver disease   . High cholesterol   . Ascites 09/22/11    "first time for me"  . Pancreatitis   . History of blood transfusion 07/2011    "when I had elbow surgery"  . GERD (gastroesophageal reflux disease)   . Staph infection     left foot  . Fracture, humerus 07/2011    right  . Dysrhythmia     "irregular"  . Shortness of breath 09/22/11    "because of all the fluid that's built up in my stomach"  . Arthritis     "all over"  . Peripheral vascular disease    Past Surgical History  Procedure Laterality Date  . Amputation      two toes on left foot  . Bunionectomy      both feet  . Arthroscopic repair acl      right knee  . Orif humerus fracture  07/26/2011    Procedure: OPEN REDUCTION INTERNAL FIXATION (ORIF) DISTAL HUMERUS FRACTURE;  Surgeon: Rozanna Box, MD;  Location: [place];  Service: Orthopedics;  Laterality: Right;  . Total hip arthroplasty  ~ 2003    right  . Tubal ligation  1980  . Paracentesis  09/22/11    2.2L  . Esophagogastroduodenoscopy  09/23/2011    Procedure: ESOPHAGOGASTRODUODENOSCOPY (EGD);  Surgeon: Lear Ng, MD;   Location: [facility] ENDOSCOPY;  Service: Endoscopy;  Laterality: N/A;  . Transmetatarsal amputation  02/16/2012    Procedure: TRANSMETATARSAL AMPUTATION;  Surgeon: Newt Minion, MD;  Location: [place];  Service: Orthopedics;  Laterality: Left;  . Fracture surgery  2013    Right elbow  . Joint replacement      right hip   . Esophagogastroduodenoscopy (egd) with propofol N/A 08/22/2012    Procedure: ESOPHAGOGASTRODUODENOSCOPY (EGD) WITH PROPOFOL;  Surgeon: Arta Silence, MD;  Location: [facility] ENDOSCOPY;  Service: Endoscopy;  Laterality: N/A;   No family history on file. History  Substance Use Topics  . Smoking status: Never Smoker   . Smokeless tobacco: Never Used  . Alcohol Use: No     Comment: "last drink was day before OR in 07/2011"   OB History   Grav Para Term Preterm Abortions TAB SAB Ect Mult Living                 Review of Systems  All other systems reviewed and are negative.      Allergies  Review of patient's allergies indicates no known allergies.  Home Medications   Current Outpatient Rx  Name  Route  Sig  Dispense  Refill  .  Calcium-Vitamin D 600-200 MG-UNIT per tablet   Oral   Take 1 tablet by mouth 2 (two) times daily.   60 tablet   0   . folic acid (FOLVITE) 1 MG tablet   Oral   Take 1 tablet (1 mg total) by mouth daily.   30 tablet   0     Will need to get updated Rx from PCP.  No more ref ...   . furosemide (LASIX) 40 MG tablet   Oral   Take 1 tablet (40 mg total) by mouth 2 (two) times daily.   60 tablet   2   . mirtazapine (REMERON) 15 MG tablet   Oral   Take 1 tablet (15 mg total) by mouth at bedtime.   30 tablet   0   . omeprazole (PRILOSEC) 20 MG capsule   Oral   Take 20 mg by mouth daily.         Marland Kitchen oxyCODONE-acetaminophen (PERCOCET) 10-325 MG per tablet   Oral   Take 1 tablet by mouth every 4 (four) hours as needed for pain. Maximum 4 tablets daily   40 tablet   0   . propranolol (INDERAL) 10 MG tablet   Oral   Take 1 tablet (10  mg total) by mouth 2 (two) times daily.   60 tablet   0   . spironolactone (ALDACTONE) 50 MG tablet   Oral   Take 50 mg by mouth 2 (two) times daily.         Marland Kitchen thiamine (VITAMIN B-1) 100 MG tablet   Oral   Take 1 tablet (100 mg total) by mouth daily.   30 tablet   0     Will need to get updated Rx from PCP.  No more ref ...   . oxyCODONE-acetaminophen (PERCOCET) 5-325 MG per tablet   Oral   Take 1 tablet by mouth every 4 (four) hours as needed for severe pain.   30 tablet   0    BP 108/90  Pulse 86  Temp(Src) 98.8 F (37.1 C) (Oral)  Resp 14  SpO2 99% Physical Exam  Nursing note and vitals reviewed. Constitutional: She is oriented to person, place, and time. She appears well-developed and well-nourished.  HENT:  Head: Normocephalic and atraumatic.  Eyes: Conjunctivae and EOM are normal. Pupils are equal, round, and reactive to light.  Neck: Normal range of motion and phonation normal. Neck supple.  Cardiovascular: Normal rate, regular rhythm and intact distal pulses.   Pulmonary/Chest: Effort normal and breath sounds normal. She exhibits no tenderness.  Abdominal: Soft. She exhibits no distension. There is no tenderness. There is no guarding.  Musculoskeletal: She exhibits no tenderness.  Tender left groin. Increased pain with passive left flexion.  Neurological: She is alert and oriented to person, place, and time. She exhibits normal muscle tone.  Skin: Skin is warm and dry.  Psychiatric: She has a normal mood and affect. Her behavior is normal. Judgment and thought content normal.    ED Course  Procedures (including critical care time)  Medications  oxyCODONE-acetaminophen (PERCOCET/ROXICET) 5-325 MG per tablet 2 tablet (2 tablets Oral Given 06/26/13 1443)    Patient Vitals for the past 24 hrs:  BP Temp Temp src Pulse Resp SpO2  06/26/13 1446 108/90 mmHg - - 86 - 99 %  06/26/13 1205 117/83 mmHg 98.8 F (37.1 C) Oral 91 14 97 %    2:22 PM Reevaluation  with update and discussion. After initial assessment and  treatment, an updated evaluation reveals Feels better after meds.. [place] Review Labs Reviewed - No data to display Imaging Review Dg Hip Complete Left  06/26/2013   ADDENDUM HEADER ADDENDUM REPORT: 06/26/2013 14:33  ADDENDUM The last line of the findings and the impression should read: There is a minimally displaced LEFT superior pubic ramus fracture.  SIGNATURE  Electronically Signed   By: Kathreen Devoid   On: 06/26/2013 14:33   06/26/2013   CLINICAL DATA Hip pain  EXAM LEFT HIP - COMPLETE 2+ VIEW  COMPARISON DG FOOT 2 VIEWS*L* dated 01/04/2012; DG BONE SURVEY MET dated 02/06/2013  FINDINGS There is a a right total hip arthroplasty. There is no hardware failure or complication. There is no dislocation. There is no left hip fracture or dislocation. There is a minimally displaced right superior pubic ramus fracture.  IMPRESSION Minimally displaced right superior pubic ramus fracture.  SIGNATURE Electronically Signed: By: Kathreen Devoid On: 06/26/2013 13:02     EKG Interpretation None      MDM   Final diagnoses:  Pelvic fracture    Mechanical fall with injury to left kidney grams, fracture. This is a stable fracture. There are no other injuries.  Nursing Notes Reviewed/ Care Coordinated Applicable Imaging Reviewed Interpretation of Laboratory Data incorporated into ED treatment  The patient appears reasonably screened and/or stabilized for discharge and I doubt any other medical condition or other [facility] requiring further screening, evaluation, or treatment in the ED at this time prior to discharge.  Plan: Home Medications- Percocet; Home Treatments- rest, use walker for ambulation; return here if the recommended treatment, does not improve the symptoms; Recommended follow up- orthopedic followup in one week     Richarda Blade, MD 06/26/13 1542

## 2013-07-25 ENCOUNTER — Other Ambulatory Visit: Payer: Self-pay | Admitting: Gastroenterology

## 2013-07-25 DIAGNOSIS — K7460 Unspecified cirrhosis of liver: Secondary | ICD-10-CM

## 2013-08-06 ENCOUNTER — Ambulatory Visit
Admission: RE | Admit: 2013-08-06 | Discharge: 2013-08-06 | Disposition: A | Discharge status: Home / Self Care | Payer: Medicare Other | Source: Ambulatory Visit | Attending: Gastroenterology | Admitting: Gastroenterology

## 2013-08-06 DIAGNOSIS — K7460 Unspecified cirrhosis of liver: Secondary | ICD-10-CM

## 2013-11-07 IMAGING — CT CT ABD-PELV W/O CM
2 of 4 series · 13 of 42 positions shown, 19 images · non-contrast
Comparison: 10/11/2011.

CLINICAL DATA: Suspected abdominal sepsis.  Hypotension.

CT ABDOMEN AND PELVIS WITHOUT CONTRAST
TECHNIQUE: Multidetector CT imaging of the abdomen and pelvis was
performed following the standard protocol without intravenous
contrast.

[Series 2: routine abdomen · axial · 0.70mm/px · z∈[-418,-28]mm · 10 of 94 slices shown, 16 images]
[im 8/94  soft-tissue]
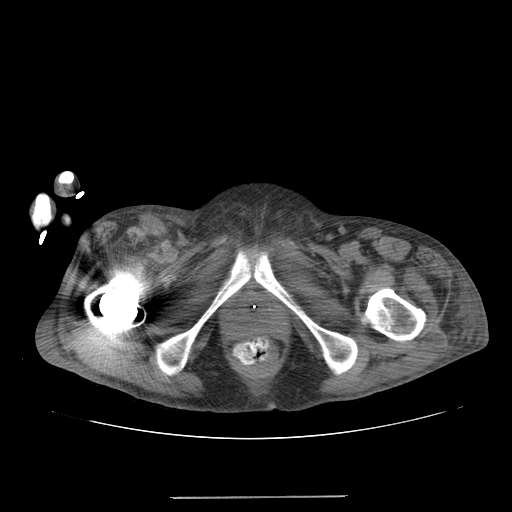
[im 8/94  bone]
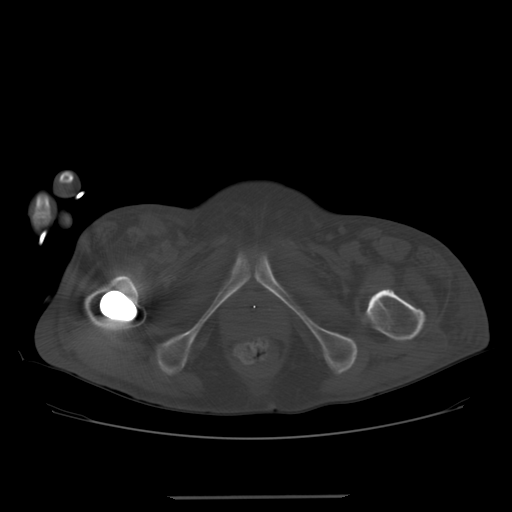
[im 22/94  soft-tissue]
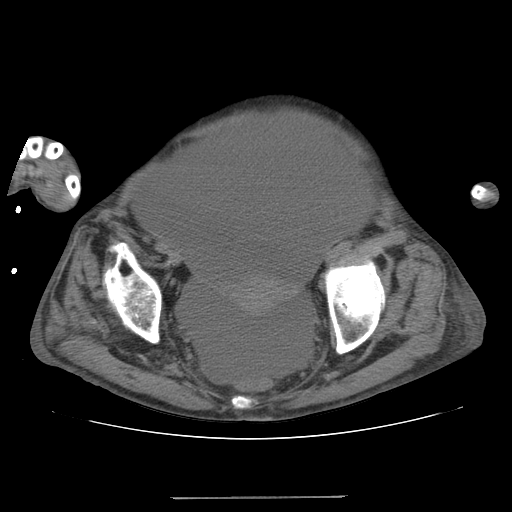
[im 29/94  soft-tissue]
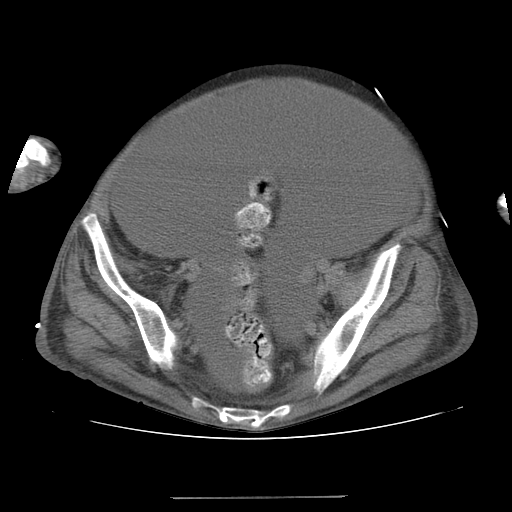
[im 36/94  soft-tissue]
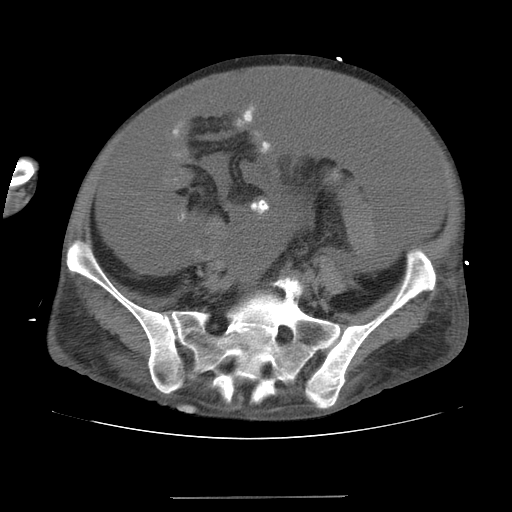
[im 43/94  soft-tissue]
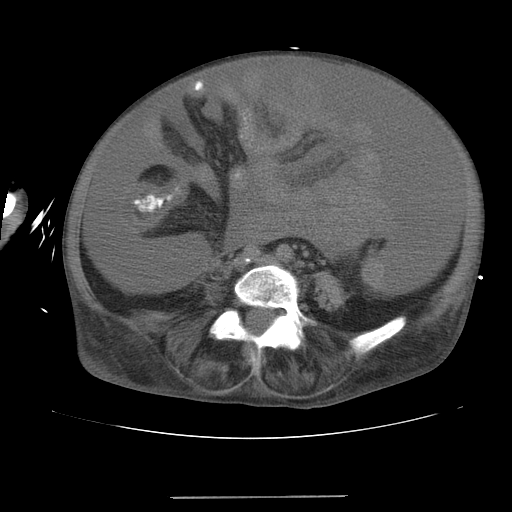
[im 58/94  soft-tissue]
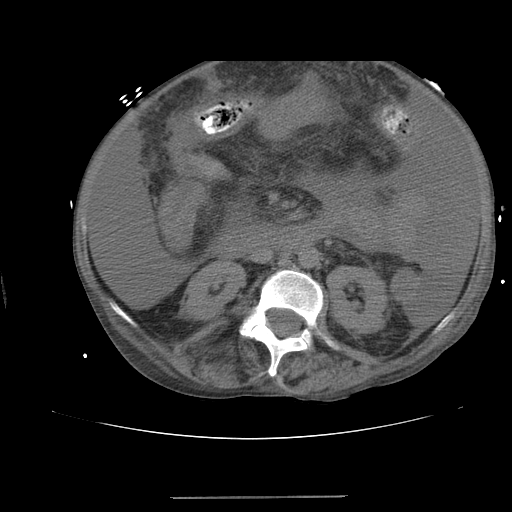
[im 65/94  soft-tissue]
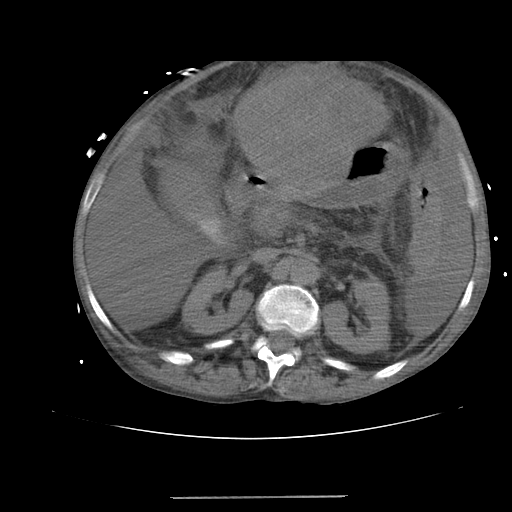
[im 65/94  lung]
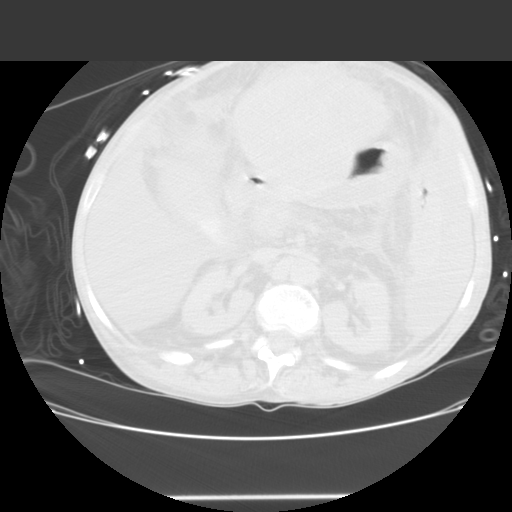
[im 72/94  soft-tissue]
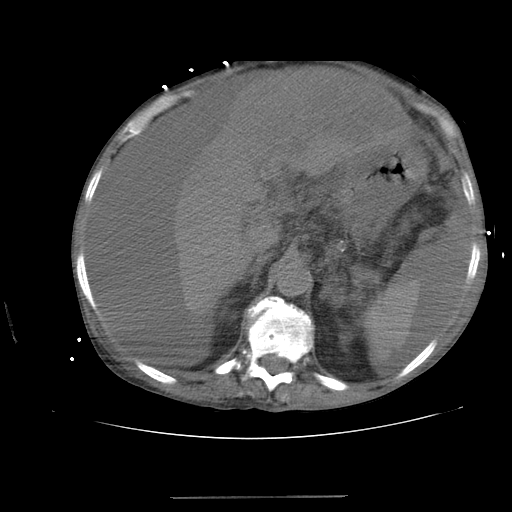
[im 72/94  lung]
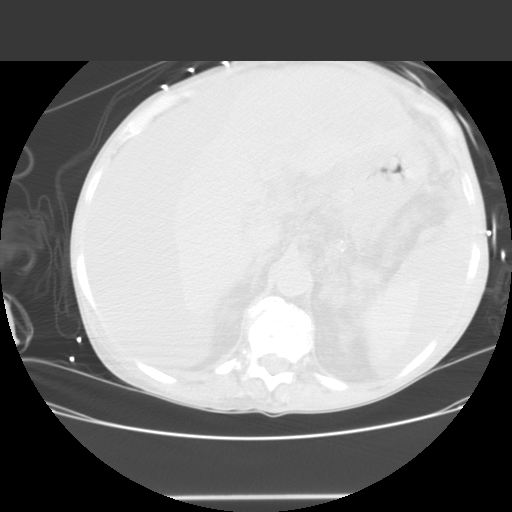
[im 79/94  soft-tissue]
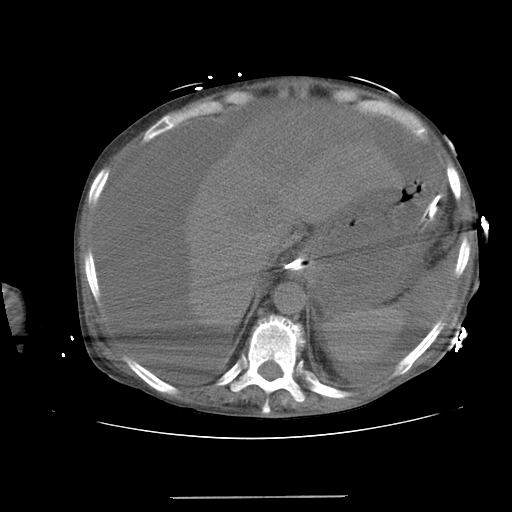
[im 79/94  lung]
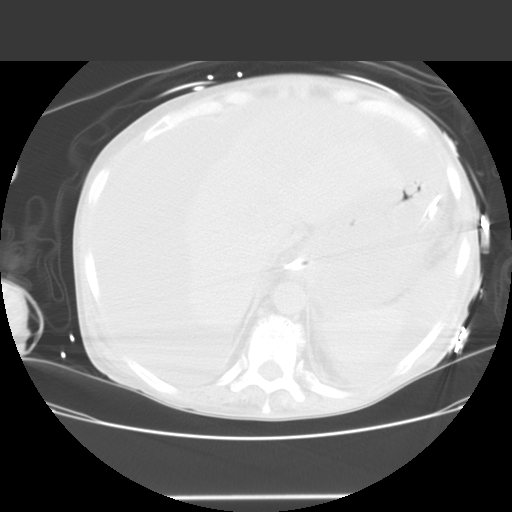
[im 79/94  bone]
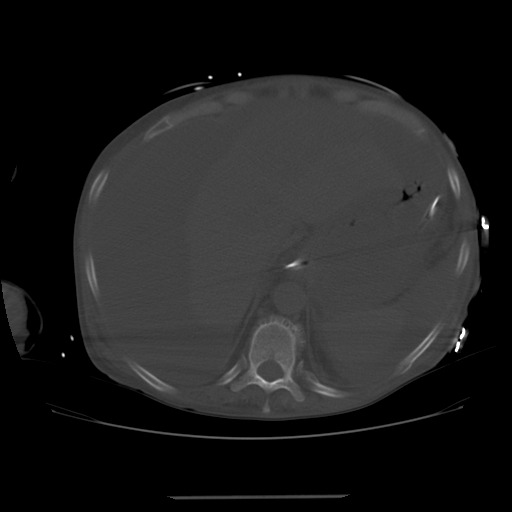
[im 86/94  soft-tissue]
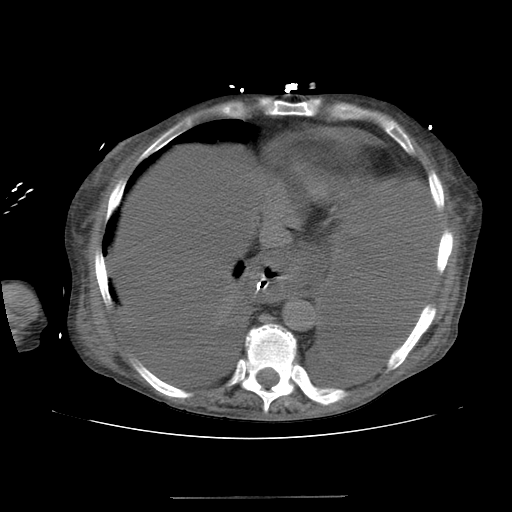
[im 86/94  lung]
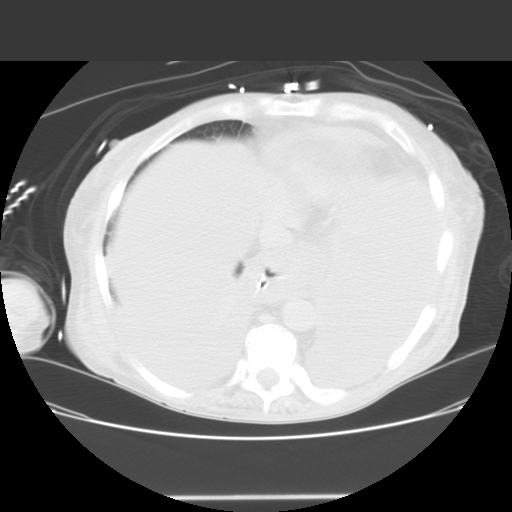

[Series 400: cor · coronal · 0.93mm/px · 3 of 84 slices shown]
[im 28/84  soft-tissue]
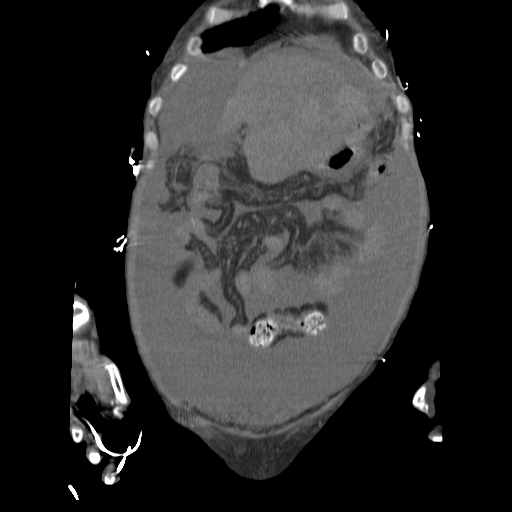
[im 37/84  soft-tissue]
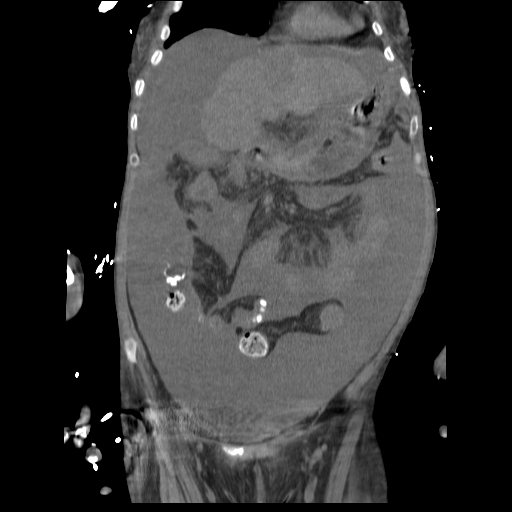
[im 47/84  soft-tissue]
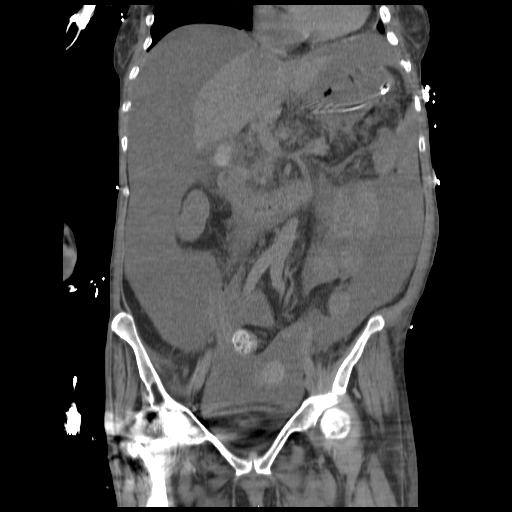

[13 of 42 positions shown; findings below may reference images not displayed]

FINDINGS: Again noted changes of hepatic cirrhosis with enlarged
lateral segment left lobe and atrophy of the right lobe.  Normal
spleen.  Gallbladder sludge.  Extensive free fluid throughout the
abdomen similar attenuation to priors, consistent with ascites, not
hemoperitoneum.  Normal pancreas, adrenal glands, abdominal aorta,
and retroperitoneal lymph nodes.  Nondilated renal collecting
systems.  4 mm stone dependent portion left kidney is stable.  No
free air.  Status post right total hip arthroplasty causes streak
artifact.  Unremarkable uterus and adnexa.  Non-visualized
appendix.  No bowel obstruction.  No retroperitoneal hemorrhage.
Degenerative changes lumbar spine.

Lung bases show bilateral pulmonary opacities with air
bronchograms, greater on the left along with bilateral pleural
effusions.  This is worse from priors.  Bilateral pneumonia not
excluded.
IMPRESSION: Cirrhosis with extensive ascites.  No evidence for intra-abdominal
hemorrhage.

Worsening bilateral lower lobe pulmonary opacities and bilateral
pleural effusions could represent acute pneumonia.

## 2013-11-07 IMAGING — CR DG CHEST 1V PORT
1 series · 1 of 1 positions shown · non-contrast
Comparison: Chest 01/02/2012 at 9955 hours.

CLINICAL DATA: Status post intubation.

PORTABLE CHEST - 1 VIEW

[AP]
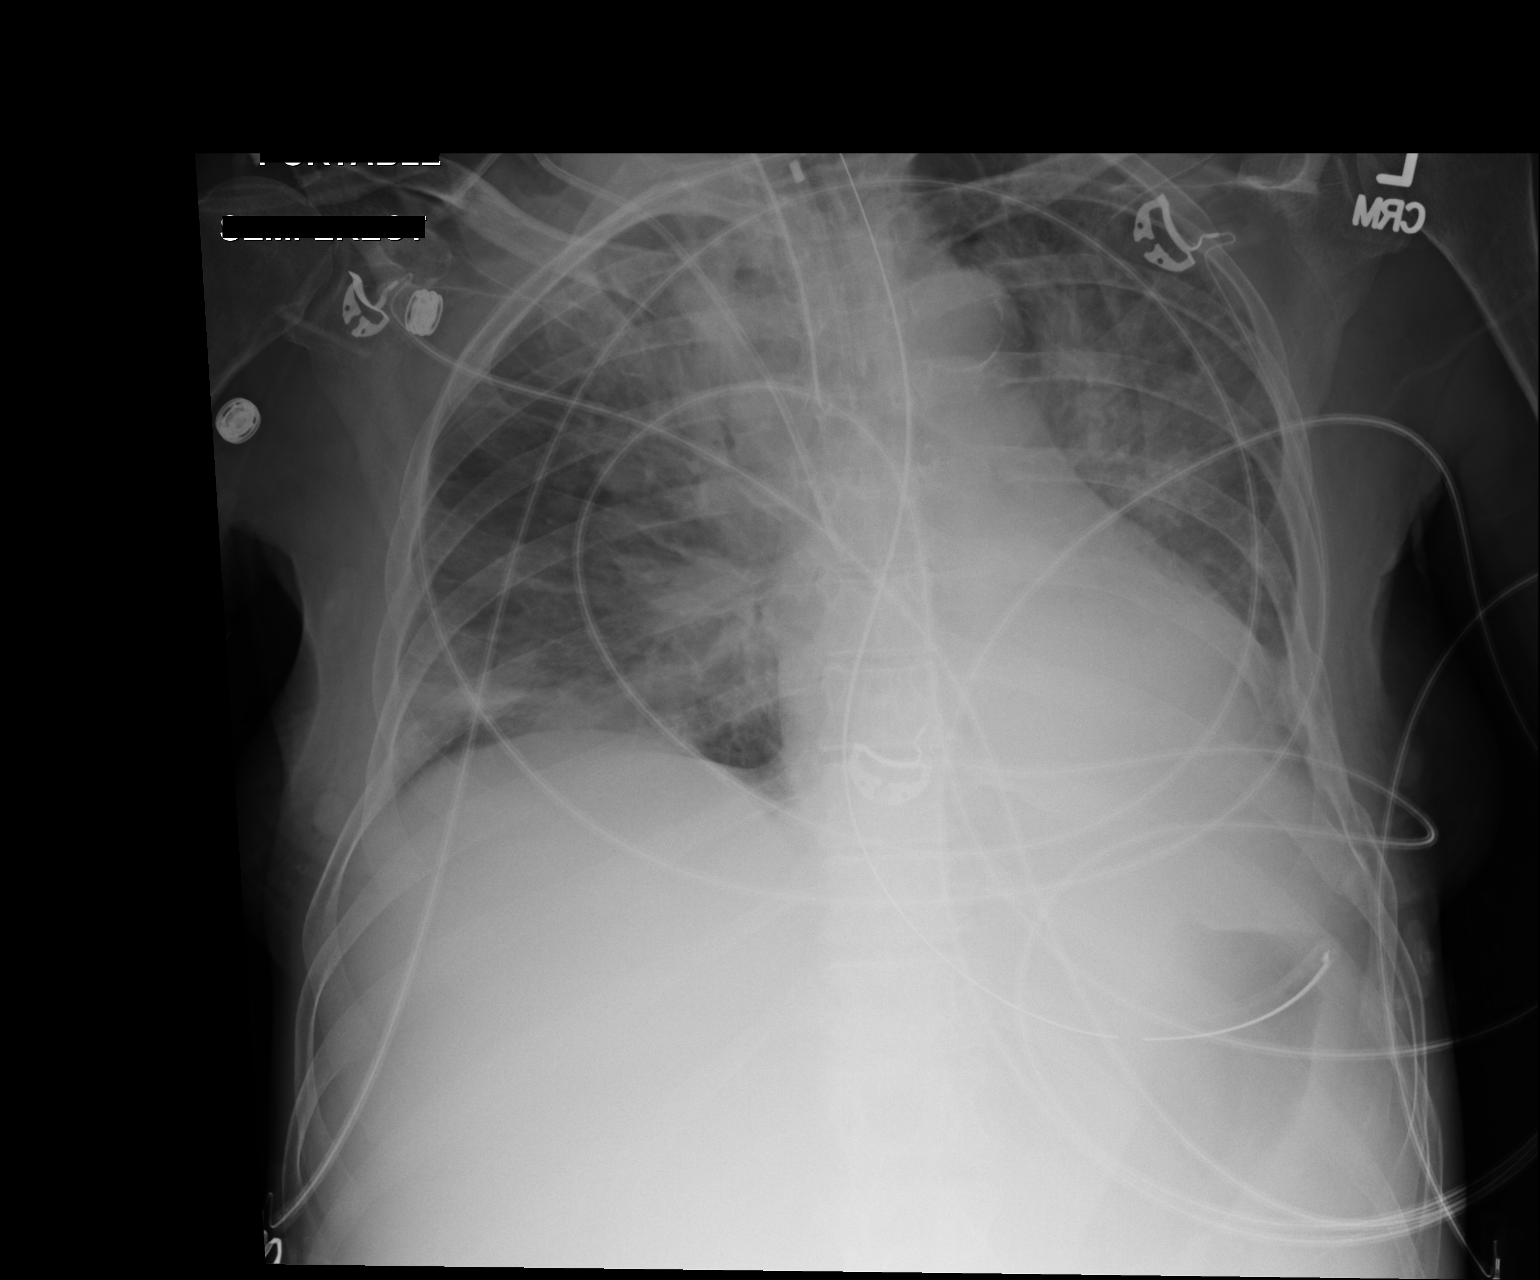

[1 of 1 positions shown; findings below may reference images not displayed]

FINDINGS: New endotracheal tube is in place with tip 1.7 cm above
the carina.  New NG tube is in good position with the tip in the
fundus of the stomach.  Right IJ catheter is again noted.
Bilateral airspace disease has progressed.  Heart size is upper
normal.  No pneumothorax.
IMPRESSION: 1.  ET tube and NG tube in good position.
2.  Increased airspace disease likely due to pulmonary edema.

## 2013-12-10 ENCOUNTER — Other Ambulatory Visit: Payer: Self-pay | Admitting: Gastroenterology

## 2013-12-10 DIAGNOSIS — K7030 Alcoholic cirrhosis of liver without ascites: Secondary | ICD-10-CM

## 2013-12-30 ENCOUNTER — Emergency Department (HOSPITAL_COMMUNITY)
Admission: EM | Admit: 2013-12-30 | Discharge: 2013-12-30 | Disposition: A | Discharge status: Home / Self Care | Payer: Medicare Other | Attending: Emergency Medicine | Admitting: Emergency Medicine

## 2013-12-30 ENCOUNTER — Encounter (HOSPITAL_COMMUNITY): Payer: Self-pay | Admitting: Emergency Medicine

## 2013-12-30 ENCOUNTER — Emergency Department (HOSPITAL_COMMUNITY): Payer: Medicare Other

## 2013-12-30 DIAGNOSIS — I10 Essential (primary) hypertension: Secondary | ICD-10-CM | POA: Diagnosis not present

## 2013-12-30 DIAGNOSIS — Z8619 Personal history of other infectious and parasitic diseases: Secondary | ICD-10-CM | POA: Insufficient documentation

## 2013-12-30 DIAGNOSIS — M542 Cervicalgia: Secondary | ICD-10-CM | POA: Insufficient documentation

## 2013-12-30 DIAGNOSIS — M549 Dorsalgia, unspecified: Secondary | ICD-10-CM | POA: Diagnosis present

## 2013-12-30 DIAGNOSIS — Z8781 Personal history of (healed) traumatic fracture: Secondary | ICD-10-CM | POA: Insufficient documentation

## 2013-12-30 DIAGNOSIS — R000 Tachycardia, unspecified: Secondary | ICD-10-CM | POA: Diagnosis not present

## 2013-12-30 DIAGNOSIS — Z79899 Other long term (current) drug therapy: Secondary | ICD-10-CM | POA: Insufficient documentation

## 2013-12-30 DIAGNOSIS — Z8639 Personal history of other endocrine, nutritional and metabolic disease: Secondary | ICD-10-CM | POA: Insufficient documentation

## 2013-12-30 DIAGNOSIS — Z862 Personal history of diseases of the blood and blood-forming organs and certain disorders involving the immune mechanism: Secondary | ICD-10-CM | POA: Insufficient documentation

## 2013-12-30 DIAGNOSIS — N390 Urinary tract infection, site not specified: Secondary | ICD-10-CM | POA: Diagnosis not present

## 2013-12-30 DIAGNOSIS — K219 Gastro-esophageal reflux disease without esophagitis: Secondary | ICD-10-CM | POA: Insufficient documentation

## 2013-12-30 DIAGNOSIS — M129 Arthropathy, unspecified: Secondary | ICD-10-CM | POA: Insufficient documentation

## 2013-12-30 DIAGNOSIS — G8929 Other chronic pain: Secondary | ICD-10-CM | POA: Diagnosis not present

## 2013-12-30 LAB — CBC WITH DIFFERENTIAL/PLATELET
BASOS ABS: 0 10*3/uL (ref 0.0–0.1)
BASOS PCT: 0 % (ref 0–1)
Eosinophils Absolute: 0 10*3/uL (ref 0.0–0.7)
Eosinophils Relative: 0 % (ref 0–5)
HEMATOCRIT: 31.9 % — AB (ref 36.0–46.0)
Hemoglobin: 10.3 g/dL — ABNORMAL LOW (ref 12.0–15.0)
LYMPHS PCT: 13 % (ref 12–46)
Lymphs Abs: 1.2 10*3/uL (ref 0.7–4.0)
MCH: 31.7 pg (ref 26.0–34.0)
MCHC: 32.3 g/dL (ref 30.0–36.0)
MCV: 98.2 fL (ref 78.0–100.0)
MONO ABS: 1.2 10*3/uL — AB (ref 0.1–1.0)
Monocytes Relative: 13 % — ABNORMAL HIGH (ref 3–12)
NEUTROS ABS: 7 10*3/uL (ref 1.7–7.7)
NEUTROS PCT: 74 % (ref 43–77)
Platelets: 290 10*3/uL (ref 150–400)
RBC: 3.25 MIL/uL — ABNORMAL LOW (ref 3.87–5.11)
RDW: 12.6 % (ref 11.5–15.5)
WBC: 9.5 10*3/uL (ref 4.0–10.5)

## 2013-12-30 LAB — COMPREHENSIVE METABOLIC PANEL
ALBUMIN: 2.9 g/dL — AB (ref 3.5–5.2)
ALK PHOS: 128 U/L — AB (ref 39–117)
ALT: 6 U/L (ref 0–35)
AST: 16 U/L (ref 0–37)
Anion gap: 18 — ABNORMAL HIGH (ref 5–15)
BILIRUBIN TOTAL: 0.9 mg/dL (ref 0.3–1.2)
BUN: 13 mg/dL (ref 6–23)
CHLORIDE: 90 meq/L — AB (ref 96–112)
CO2: 21 meq/L (ref 19–32)
CREATININE: 0.92 mg/dL (ref 0.50–1.10)
Calcium: 9.9 mg/dL (ref 8.4–10.5)
GFR calc Af Amer: 76 mL/min — ABNORMAL LOW (ref >90)
GFR, EST NON AFRICAN AMERICAN: 66 mL/min — AB (ref >90)
Glucose, Bld: 128 mg/dL — ABNORMAL HIGH (ref 70–99)
POTASSIUM: 4.3 meq/L (ref 3.7–5.3)
Sodium: 129 mEq/L — ABNORMAL LOW (ref 137–147)
Total Protein: 8.4 g/dL — ABNORMAL HIGH (ref 6.0–8.3)

## 2013-12-30 LAB — URINALYSIS, ROUTINE W REFLEX MICROSCOPIC
Bilirubin Urine: NEGATIVE
GLUCOSE, UA: NEGATIVE mg/dL
HGB URINE DIPSTICK: NEGATIVE
Ketones, ur: NEGATIVE mg/dL
Nitrite: NEGATIVE
PH: 6.5 (ref 5.0–8.0)
Protein, ur: NEGATIVE mg/dL
Specific Gravity, Urine: 1.009 (ref 1.005–1.030)
Urobilinogen, UA: 0.2 mg/dL (ref 0.0–1.0)

## 2013-12-30 LAB — I-STAT CG4 LACTIC ACID, ED: Lactic Acid, Venous: 1.5 mmol/L (ref 0.5–2.2)

## 2013-12-30 LAB — URINE MICROSCOPIC-ADD ON

## 2013-12-30 MED ORDER — ONDANSETRON HCL 4 MG/2ML IJ SOLN
4.0000 mg | Freq: Once | INTRAMUSCULAR | Status: AC
  Administered 2013-12-30: 4 mg via INTRAVENOUS
  Filled 2013-12-30: qty 2

## 2013-12-30 MED ORDER — TRAMADOL HCL 50 MG PO TABS: 50.0000 mg | ORAL_TABLET | Freq: Four times a day (QID) | ORAL | Status: DC | PRN

## 2013-12-30 MED ORDER — SODIUM CHLORIDE 0.9 % IV SOLN
INTRAVENOUS | Status: DC
  Administered 2013-12-30: 15:00:00 via INTRAVENOUS

## 2013-12-30 MED ORDER — CEPHALEXIN 500 MG PO CAPS: 500.0000 mg | ORAL_CAPSULE | Freq: Four times a day (QID) | ORAL | Status: DC

## 2013-12-30 MED ORDER — ACETAMINOPHEN 325 MG PO TABS
650.0000 mg | ORAL_TABLET | Freq: Once | ORAL | Status: AC
  Administered 2013-12-30: 650 mg via ORAL
  Filled 2013-12-30: qty 2

## 2013-12-30 MED ORDER — DEXTROSE 5 % IV SOLN
1.0000 g | INTRAVENOUS | Status: DC
  Administered 2013-12-30: 1 g via INTRAVENOUS
  Filled 2013-12-30: qty 10

## 2013-12-30 MED ORDER — MORPHINE SULFATE 4 MG/ML IJ SOLN
4.0000 mg | Freq: Once | INTRAMUSCULAR | Status: AC
  Administered 2013-12-30: 4 mg via INTRAVENOUS
  Filled 2013-12-30: qty 1

## 2013-12-30 NOTE — Discharge Instructions (Signed)
Return here at once for fever, chills, increased swelling, or any other problems. If you develop a rash with taking the tramadol, stopped taking it and use a dose of Benadryl. If your rash doesn't resolve within an hour, return here Urinary Tract Infection Urinary tract infections (UTIs) can develop anywhere along your urinary tract. Your urinary tract is your body's drainage system for removing wastes and extra water. Your urinary tract includes two kidneys, two ureters, a bladder, and a urethra. Your kidneys are a pair of bean-shaped organs. Each kidney is about the size of your fist. They are located below your ribs, one on each side of your spine. CAUSES Infections are caused by microbes, which are microscopic organisms, including fungi, viruses, and bacteria. These organisms are so small that they can only be seen through a microscope. Bacteria are the microbes that most commonly cause UTIs. SYMPTOMS  Symptoms of UTIs may vary by age and gender of the patient and by the location of the infection. Symptoms in young women typically include a frequent and intense urge to urinate and a painful, burning feeling in the bladder or urethra during urination. Older women and men are more likely to be tired, shaky, and weak and have muscle aches and abdominal pain. A fever may mean the infection is in your kidneys. Other symptoms of a kidney infection include pain in your back or sides below the ribs, nausea, and vomiting. DIAGNOSIS To diagnose a UTI, your caregiver will ask you about your symptoms. Your caregiver also will ask to provide a urine sample. The urine sample will be tested for bacteria and white blood cells. White blood cells are made by your body to help fight infection. TREATMENT  Typically, UTIs can be treated with medication. Because most UTIs are caused by a bacterial infection, they usually can be treated with the use of antibiotics. The choice of antibiotic and length of treatment depend on  your symptoms and the type of bacteria causing your infection. HOME CARE INSTRUCTIONS  If you were prescribed antibiotics, take them exactly as your caregiver instructs you. Finish the medication even if you feel better after you have only taken some of the medication.  Drink enough water and fluids to keep your urine clear or pale yellow.  Avoid caffeine, tea, and carbonated beverages. They tend to irritate your bladder.  Empty your bladder often. Avoid holding urine for long periods of time.  Empty your bladder before and after sexual intercourse.  After a bowel movement, women should cleanse from front to back. Use each tissue only once. SEEK MEDICAL CARE IF:   You have back pain.  You develop a fever.  Your symptoms do not begin to resolve within 3 days. SEEK IMMEDIATE MEDICAL CARE IF:   You have severe back pain or lower abdominal pain.  You develop chills.  You have nausea or vomiting.  You have continued burning or discomfort with urination. MAKE SURE YOU:   Understand these instructions.  Will watch your condition.  Will get help right away if you are not doing well or get worse. Document Released: 01/12/2005 Document Revised: 10/04/2011 Document Reviewed: 05/13/2011 [facility] Patient Information 2015 [place], [place]. This information is not intended to replace advice given to you by your health care provider. Make sure you discuss any questions you have with your health care provider.

## 2013-12-30 NOTE — ED Notes (Signed)
Per [facility] pt from home with hx of osteoporosis and is c/o of neck, back, left hand, right foot pain. Denies fall or trauma. Reports pain since Friday. Pt unable to ambulated due to pain.

## 2013-12-30 NOTE — ED Notes (Signed)
Bed: WTR9 Expected date:  Expected time:  Means of arrival:  Comments: EMS-hand/foot pain

## 2013-12-30 NOTE — ED Notes (Signed)
Pt using bedpan. 

## 2013-12-30 NOTE — ED Provider Notes (Signed)
CSN: [id number]     Arrival date & time 12/30/13  1102 History   First MD Initiated Contact with Patient 12/30/13 1128     Chief Complaint  Patient presents with  . Multiple Complaints      (Consider location/radiation/quality/duration/timing/severity/associated sxs/prior Treatment) HPI Comments: Patient here complaining of worsening chronic neck and back pain. She also has a temperature which began this morning up to 101 at home. Recently treated for UTI and finished antibiotics 3 days ago. She was also diagnosed with having renal insufficiency as well as hypokalemia and this was treated. Denies any cough or congestion. No shortness of breath. No rashes noted. No photophobia. No severe headache. Denies recent history of trauma. Notes pain to her right foot and left hand with swelling at the joints. Symptoms persisted and worse with movement and better with rest. Has used over-the-counter medications without results.  The history is provided by the patient.    Past Medical History  Diagnosis Date  . Hypertension   . Osteoporosis   . Alcoholic liver disease   . High cholesterol   . Ascites 09/22/11    "first time for me"  . Pancreatitis   . History of blood transfusion 07/2011    "when I had elbow surgery"  . GERD (gastroesophageal reflux disease)   . Staph infection     left foot  . Fracture, humerus 07/2011    right  . Dysrhythmia     "irregular"  . Shortness of breath 09/22/11    "because of all the fluid that's built up in my stomach"  . Arthritis     "all over"  . Peripheral vascular disease    Past Surgical History  Procedure Laterality Date  . Amputation      two toes on left foot  . Bunionectomy      both feet  . Arthroscopic repair acl      right knee  . Orif humerus fracture  07/26/2011    Procedure: OPEN REDUCTION INTERNAL FIXATION (ORIF) DISTAL HUMERUS FRACTURE;  Surgeon: Budd Palmer, MD;  Location: [facility] OR;  Service: Orthopedics;  Laterality: Right;  . Total hip  arthroplasty  ~ 2003    right  . Tubal ligation  1980  . Paracentesis  09/22/11    2.2L  . Esophagogastroduodenoscopy  09/23/2011    Procedure: ESOPHAGOGASTRODUODENOSCOPY (EGD);  Surgeon: Shirley Friar, MD;  Location: [facility] ENDOSCOPY;  Service: Endoscopy;  Laterality: N/A;  . Transmetatarsal amputation  02/16/2012    Procedure: TRANSMETATARSAL AMPUTATION;  Surgeon: Nadara Mustard, MD;  Location: [facility] OR;  Service: Orthopedics;  Laterality: Left;  . Fracture surgery  2013    Right elbow  . Joint replacement      right hip   . Esophagogastroduodenoscopy (egd) with propofol N/A 08/22/2012    Procedure: ESOPHAGOGASTRODUODENOSCOPY (EGD) WITH PROPOFOL;  Surgeon: Willis Modena, MD;  Location: [facility] ENDOSCOPY;  Service: Endoscopy;  Laterality: N/A;   History reviewed. No pertinent family history. History  Substance Use Topics  . Smoking status: Never Smoker   . Smokeless tobacco: Never Used  . Alcohol Use: No     Comment: "last drink was day before OR in 07/2011"   OB History   Grav Para Term Preterm Abortions TAB SAB Ect Mult Living                 Review of Systems  All other systems reviewed and are negative.     Allergies  Codeine  Home Medications  Prior to Admission medications   Medication Sig Start Date End Date Taking? Authorizing Provider  Calcium-Vitamin D 600-200 MG-UNIT per tablet Take 1 tablet by mouth 2 (two) times daily. 03/26/12  Yes Brent Bulla, MD  folic acid (FOLVITE) 1 MG tablet Take 1 tablet (1 mg total) by mouth daily. 04/20/12  Yes Brent Bulla, MD  furosemide (LASIX) 40 MG tablet Take 1 tablet (40 mg total) by mouth 2 (two) times daily. 03/26/12  Yes Brent Bulla, MD  mirtazapine (REMERON) 15 MG tablet Take 1 tablet (15 mg total) by mouth at bedtime. 03/26/12  Yes Brent Bulla, MD  omeprazole (PRILOSEC) 20 MG capsule Take 20 mg by mouth daily. 01/29/13  Yes Historical Provider, MD  propranolol (INDERAL) 10 MG tablet Take 1 tablet (10 mg total) by mouth 2 (two)  times daily. 03/26/12   Brent Bulla, MD  spironolactone (ALDACTONE) 50 MG tablet Take 50 mg by mouth 2 (two) times daily. 03/26/12   Brent Bulla, MD  thiamine (VITAMIN B-1) 100 MG tablet Take 1 tablet (100 mg total) by mouth daily. 04/20/12   Brent Bulla, MD   BP 100/72  Pulse 104  Temp(Src) 100.1 F (37.8 C) (Oral)  Resp 16  SpO2 100% Physical Exam  Nursing note and vitals reviewed. Constitutional: She is oriented to person, place, and time. She appears well-developed and well-nourished.  Non-toxic appearance. No distress.  HENT:  Head: Normocephalic and atraumatic.  Eyes: Conjunctivae, EOM and lids are normal. Pupils are equal, round, and reactive to light.  Neck: Normal range of motion. Neck supple. No tracheal deviation present. No mass present.  Cardiovascular: Regular rhythm and normal heart sounds.  Tachycardia present.  Exam reveals no gallop.   No murmur heard. Pulmonary/Chest: Effort normal and breath sounds normal. No stridor. No respiratory distress. She has no decreased breath sounds. She has no wheezes. She has no rhonchi. She has no rales.  Abdominal: Soft. Normal appearance and bowel sounds are normal. She exhibits no distension. There is no tenderness. There is no rebound and no CVA tenderness.  Musculoskeletal: Normal range of motion. She exhibits no edema and no tenderness.       Hands:      Feet:  Neurological: She is alert and oriented to person, place, and time. She has normal strength. No cranial nerve deficit or sensory deficit. GCS eye subscore is 4. GCS verbal subscore is 5. GCS motor subscore is 6.  Skin: Skin is warm and dry. No abrasion and no rash noted.  Psychiatric: She has a normal mood and affect. Her speech is normal and behavior is normal.    ED Course  Procedures (including critical care time) Labs Review Labs Reviewed  CULTURE, BLOOD (ROUTINE X 2)  CULTURE, BLOOD (ROUTINE X 2)  URINE CULTURE  CBC WITH DIFFERENTIAL  COMPREHENSIVE METABOLIC  PANEL  URINALYSIS, ROUTINE W REFLEX MICROSCOPIC  I-STAT CG4 LACTIC ACID, ED    Imaging Review No results found.   EKG Interpretation   Date/Time:  Monday December 30 2013 12:15:49 EDT Ventricular Rate:  93 PR Interval:  138 QRS Duration: 82 QT Interval:  360 QTC Calculation: 448 R Axis:   142 Text Interpretation:  Sinus rhythm Probable right ventricular hypertrophy  Abnrm T, consider ischemia, anterolateral lds unchanged from 2004  Confirmed by Jamien Casanova  MD, Ivanell Deshotel ([id number]) on 12/30/2013 3:07:37 PM      MDM   Final diagnoses:  None    I will place the  patient on a different course of antibiotics. I will give her an IV dose here. No evidence of systemic infection at this time. Blood cultures obtained. Return precautions given and patient is comfortable with this plan    Toy Baker, MD 12/30/13 1555

## 2013-12-30 NOTE — ED Notes (Addendum)
Pt presents w/ multiple complaints.  Reports multiple recent medication changes.  C/o R foot and L hand swelling and pain in neck and back x 3 days.  Pt sts Patal MD wanted her to have blood work done today, but she was in too much pain.  Pain score 8/10.  Sts neck and back pain is chronic and they recently took her off her pain medication.  Denies recent injury.  Recently diagnosed w/ an UTI and finished antibiotics x 4 days ago.

## 2013-12-31 LAB — URINE CULTURE: Colony Count: 60000

## 2014-01-01 ENCOUNTER — Other Ambulatory Visit: Payer: Self-pay | Admitting: Nephrology

## 2014-01-01 DIAGNOSIS — N179 Acute kidney failure, unspecified: Secondary | ICD-10-CM

## 2014-01-05 LAB — CULTURE, BLOOD (ROUTINE X 2)
Culture: NO GROWTH
Culture: NO GROWTH

## 2014-01-09 ENCOUNTER — Other Ambulatory Visit: Payer: Self-pay | Admitting: Nephrology

## 2014-01-09 ENCOUNTER — Ambulatory Visit
Admission: RE | Admit: 2014-01-09 | Discharge: 2014-01-09 | Disposition: A | Discharge status: Home / Self Care | Payer: Medicare Other | Source: Ambulatory Visit | Attending: Nephrology | Admitting: Nephrology

## 2014-01-09 DIAGNOSIS — N179 Acute kidney failure, unspecified: Secondary | ICD-10-CM

## 2014-01-28 ENCOUNTER — Other Ambulatory Visit: Payer: Medicare Other

## 2014-08-20 ENCOUNTER — Other Ambulatory Visit: Payer: Self-pay | Admitting: Gastroenterology

## 2014-08-20 DIAGNOSIS — K7460 Unspecified cirrhosis of liver: Secondary | ICD-10-CM

## 2014-08-25 ENCOUNTER — Ambulatory Visit
Admission: RE | Admit: 2014-08-25 | Discharge: 2014-08-25 | Disposition: A | Discharge status: Home / Self Care | Payer: Medicare Other | Source: Ambulatory Visit | Attending: Gastroenterology | Admitting: Gastroenterology

## 2014-08-25 DIAGNOSIS — K7460 Unspecified cirrhosis of liver: Secondary | ICD-10-CM

## 2014-08-27 ENCOUNTER — Other Ambulatory Visit: Payer: Self-pay | Admitting: Gastroenterology

## 2014-08-27 DIAGNOSIS — K8689 Other specified diseases of pancreas: Secondary | ICD-10-CM

## 2014-09-01 ENCOUNTER — Ambulatory Visit
Admission: RE | Admit: 2014-09-01 | Discharge: 2014-09-01 | Disposition: A | Discharge status: Home / Self Care | Payer: Medicare Other | Source: Ambulatory Visit | Attending: Gastroenterology | Admitting: Gastroenterology

## 2014-09-01 DIAGNOSIS — K8689 Other specified diseases of pancreas: Secondary | ICD-10-CM

## 2014-09-01 MED ORDER — GADOBENATE DIMEGLUMINE 529 MG/ML IV SOLN
5.0000 mL | Freq: Once | INTRAVENOUS | Status: AC | PRN
  Administered 2014-09-01: 5 mL via INTRAVENOUS

## 2014-09-03 ENCOUNTER — Other Ambulatory Visit: Payer: Medicare Other

## 2014-09-29 ENCOUNTER — Encounter (HOSPITAL_COMMUNITY): Payer: Self-pay | Admitting: *Deleted

## 2014-09-29 ENCOUNTER — Emergency Department (HOSPITAL_COMMUNITY)
Admission: EM | Admit: 2014-09-29 | Discharge: 2014-09-29 | Disposition: A | Discharge status: Home / Self Care | Payer: Medicare Other | Attending: Emergency Medicine | Admitting: Emergency Medicine

## 2014-09-29 ENCOUNTER — Emergency Department (HOSPITAL_COMMUNITY): Payer: Medicare Other

## 2014-09-29 DIAGNOSIS — T507X1A Poisoning by analeptics and opioid receptor antagonists, accidental (unintentional), initial encounter: Secondary | ICD-10-CM | POA: Diagnosis not present

## 2014-09-29 DIAGNOSIS — Z8619 Personal history of other infectious and parasitic diseases: Secondary | ICD-10-CM | POA: Insufficient documentation

## 2014-09-29 DIAGNOSIS — W19XXXA Unspecified fall, initial encounter: Secondary | ICD-10-CM

## 2014-09-29 DIAGNOSIS — Z8639 Personal history of other endocrine, nutritional and metabolic disease: Secondary | ICD-10-CM | POA: Insufficient documentation

## 2014-09-29 DIAGNOSIS — M810 Age-related osteoporosis without current pathological fracture: Secondary | ICD-10-CM | POA: Insufficient documentation

## 2014-09-29 DIAGNOSIS — Y9289 Other specified places as the place of occurrence of the external cause: Secondary | ICD-10-CM | POA: Insufficient documentation

## 2014-09-29 DIAGNOSIS — Z79899 Other long term (current) drug therapy: Secondary | ICD-10-CM | POA: Insufficient documentation

## 2014-09-29 DIAGNOSIS — Y998 Other external cause status: Secondary | ICD-10-CM | POA: Insufficient documentation

## 2014-09-29 DIAGNOSIS — Y9389 Activity, other specified: Secondary | ICD-10-CM | POA: Diagnosis not present

## 2014-09-29 DIAGNOSIS — Z96641 Presence of right artificial hip joint: Secondary | ICD-10-CM | POA: Insufficient documentation

## 2014-09-29 DIAGNOSIS — S72111A Displaced fracture of greater trochanter of right femur, initial encounter for closed fracture: Secondary | ICD-10-CM | POA: Insufficient documentation

## 2014-09-29 DIAGNOSIS — K219 Gastro-esophageal reflux disease without esophagitis: Secondary | ICD-10-CM | POA: Diagnosis not present

## 2014-09-29 DIAGNOSIS — I739 Peripheral vascular disease, unspecified: Secondary | ICD-10-CM | POA: Diagnosis not present

## 2014-09-29 DIAGNOSIS — M1990 Unspecified osteoarthritis, unspecified site: Secondary | ICD-10-CM | POA: Insufficient documentation

## 2014-09-29 DIAGNOSIS — W1839XA Other fall on same level, initial encounter: Secondary | ICD-10-CM | POA: Diagnosis not present

## 2014-09-29 DIAGNOSIS — I10 Essential (primary) hypertension: Secondary | ICD-10-CM | POA: Insufficient documentation

## 2014-09-29 DIAGNOSIS — T50901A Poisoning by unspecified drugs, medicaments and biological substances, accidental (unintentional), initial encounter: Secondary | ICD-10-CM

## 2014-09-29 DIAGNOSIS — S72141A Displaced intertrochanteric fracture of right femur, initial encounter for closed fracture: Secondary | ICD-10-CM

## 2014-09-29 DIAGNOSIS — S79911A Unspecified injury of right hip, initial encounter: Secondary | ICD-10-CM | POA: Diagnosis present

## 2014-09-29 LAB — BASIC METABOLIC PANEL
Anion gap: 12 (ref 5–15)
BUN: 17 mg/dL (ref 6–20)
CHLORIDE: 93 mmol/L — AB (ref 101–111)
CO2: 28 mmol/L (ref 22–32)
CREATININE: 0.88 mg/dL (ref 0.44–1.00)
Calcium: 8.8 mg/dL — ABNORMAL LOW (ref 8.9–10.3)
GFR calc Af Amer: >60 mL/min (ref >60)
GFR calc non Af Amer: >60 mL/min (ref >60)
Glucose, Bld: 152 mg/dL — ABNORMAL HIGH (ref 65–99)
POTASSIUM: 3.4 mmol/L — AB (ref 3.5–5.1)
Sodium: 133 mmol/L — ABNORMAL LOW (ref 135–145)

## 2014-09-29 LAB — CBC WITH DIFFERENTIAL/PLATELET
BASOS PCT: 0 % (ref 0–1)
Basophils Absolute: 0 10*3/uL (ref 0.0–0.1)
EOS PCT: 3 % (ref 0–5)
Eosinophils Absolute: 0.2 10*3/uL (ref 0.0–0.7)
HEMATOCRIT: 36.7 % (ref 36.0–46.0)
HEMOGLOBIN: 11.7 g/dL — AB (ref 12.0–15.0)
Lymphocytes Relative: 12 % (ref 12–46)
Lymphs Abs: 0.8 10*3/uL (ref 0.7–4.0)
MCH: 32.6 pg (ref 26.0–34.0)
MCHC: 31.9 g/dL (ref 30.0–36.0)
MCV: 102.2 fL — AB (ref 78.0–100.0)
MONO ABS: 0.7 10*3/uL (ref 0.1–1.0)
Monocytes Relative: 10 % (ref 3–12)
Neutro Abs: 5.1 10*3/uL (ref 1.7–7.7)
Neutrophils Relative %: 75 % (ref 43–77)
Platelets: 197 10*3/uL (ref 150–400)
RBC: 3.59 MIL/uL — ABNORMAL LOW (ref 3.87–5.11)
RDW: 13.7 % (ref 11.5–15.5)
WBC: 6.8 10*3/uL (ref 4.0–10.5)

## 2014-09-29 LAB — TROPONIN I

## 2014-09-29 MED ORDER — ONDANSETRON HCL 4 MG/2ML IJ SOLN
4.0000 mg | Freq: Once | INTRAMUSCULAR | Status: DC
  Filled 2014-09-29: qty 2

## 2014-09-29 NOTE — ED Notes (Signed)
Per EMS on arrival pt lying on side; diaphoretic; decreased resps; unconscious; cbg good; nsr; pupils pinpoint; narcan intranasally given--pt started to arouse; repeat dose given iv and pt became aroused and beligerant; total narcan 2mg  given; on patient waking up attempted to straighten pts leg and c/o right hip pain with possible deformity; per husband he heard a loud noise and found pt laying on the floor; per EMS pt doesn't remember events; denies taking any extra meds or narcotics; remains awake and alert on arrival;

## 2014-09-29 NOTE — ED Notes (Signed)
Patient is being transported to CT. 

## 2014-09-29 NOTE — ED Notes (Signed)
Bed: WA17 Expected date: 09/29/14 Expected time:  Means of arrival:  Comments: Pt from home narcan/hip deform

## 2014-09-29 NOTE — Discharge Instructions (Signed)
Hip Fracture °A hip fracture is a fracture of the upper part of your thigh bone (femur).  °CAUSES °A hip fracture is caused by a direct blow to the side of your hip. This is usually the result of a fall but can occur in other circumstances, such as an automobile accident. °RISK FACTORS °There is an increased risk of hip fractures in people with: °· An unsteady walking pattern (gait) and those with conditions that contribute to poor balance, such as Parkinson's disease or dementia. °· Osteopenia and osteoporosis. °· Cancer that spreads to the leg bones. °· Certain metabolic diseases. °SYMPTOMS  °Symptoms of hip fracture include: °· Pain over the injured hip. °· Inability to put weight on the leg in which the fracture occurred (although, some patients are able to walk after a hip fracture). °· Toes and foot of the affected leg point outward when you lie down. °DIAGNOSIS °A physical exam can determine if a hip fracture is likely to have occurred. X-ray exams are needed to confirm the fracture and to look for other injuries. The X-ray exam can help to determine the type of hip fracture. Rarely, the fracture is not visible on an X-ray image and a CT scan or MRI will have to be done. °TREATMENT  °The treatment for a fracture is usually surgery. This means using a screw, nail, or rod to hold the bones in place.  °HOME CARE INSTRUCTIONS °Take all medicines as directed by your health care provider. °SEEK MEDICAL CARE IF: °Pain continues, even after taking pain medicine. °MAKE SURE YOU: °· Understand these instructions.   °· Will watch your condition. °· Will get help right away if you are not doing well or get worse. °Document Released: 04/04/2005 Document Revised: 04/09/2013 Document Reviewed: 11/14/2012 °Exit[employer]® Patient Information ©2015 [employer]. This information is not intended to replace advice given to you by your health care provider. Make sure you discuss any questions you have with your health care  provider. ° °

## 2014-09-29 NOTE — ED Provider Notes (Addendum)
CSN: [id number]     Arrival date & time 09/29/14  1150 History   First MD Initiated Contact with Patient 09/29/14 1153     Chief Complaint  Patient presents with  . Hip Pain     (Consider location/radiation/quality/duration/timing/severity/associated sxs/prior Treatment) HPI Comments: Presents to the ER for evaluation after a fall and syncope. Patient's significant other reportedly heard a loud noise and found her on the ground. EMS was called and when they arrive patient was diaphoretic, bradycardic and unresponsive. She had pinpoint pupils and was given Narcan with improvement. Patient became completely aroused, alert and oriented after Narcan.  Patient is a 64 y.o. female presenting with hip pain.  Hip Pain    Past Medical History  Diagnosis Date  . Hypertension   . Osteoporosis   . Alcoholic liver disease   . High cholesterol   . Ascites 09/22/11    "first time for me"  . Pancreatitis   . History of blood transfusion 07/2011    "when I had elbow surgery"  . GERD (gastroesophageal reflux disease)   . Staph infection     left foot  . Fracture, humerus 07/2011    right  . Dysrhythmia     "irregular"  . Shortness of breath 09/22/11    "because of all the fluid that's built up in my stomach"  . Arthritis     "all over"  . Peripheral vascular disease    Past Surgical History  Procedure Laterality Date  . Amputation      two toes on left foot  . Bunionectomy      both feet  . Arthroscopic repair acl      right knee  . Orif humerus fracture  07/26/2011    Procedure: OPEN REDUCTION INTERNAL FIXATION (ORIF) DISTAL HUMERUS FRACTURE;  Surgeon: Budd Palmer, MD;  Location: [facility] OR;  Service: Orthopedics;  Laterality: Right;  . Total hip arthroplasty  ~ 2003    right  . Tubal ligation  1980  . Paracentesis  09/22/11    2.2L  . Esophagogastroduodenoscopy  09/23/2011    Procedure: ESOPHAGOGASTRODUODENOSCOPY (EGD);  Surgeon: Shirley Friar, MD;  Location: [facility] ENDOSCOPY;  Service:  Endoscopy;  Laterality: N/A;  . Transmetatarsal amputation  02/16/2012    Procedure: TRANSMETATARSAL AMPUTATION;  Surgeon: Nadara Mustard, MD;  Location: [facility] OR;  Service: Orthopedics;  Laterality: Left;  . Fracture surgery  2013    Right elbow  . Joint replacement      right hip   . Esophagogastroduodenoscopy (egd) with propofol N/A 08/22/2012    Procedure: ESOPHAGOGASTRODUODENOSCOPY (EGD) WITH PROPOFOL;  Surgeon: Willis Modena, MD;  Location: [facility] ENDOSCOPY;  Service: Endoscopy;  Laterality: N/A;   No family history on file. History  Substance Use Topics  . Smoking status: Never Smoker   . Smokeless tobacco: Never Used  . Alcohol Use: No     Comment: "last drink was day before OR in 07/2011"   OB History    No data available     Review of Systems  Musculoskeletal: Positive for arthralgias.  Neurological: Positive for syncope.  All other systems reviewed and are negative.     Allergies  Codeine  Home Medications   Prior to Admission medications   Medication Sig Start Date End Date Taking? Authorizing Provider  allopurinol (ZYLOPRIM) 100 MG tablet Take 100 mg by mouth 2 (two) times daily.   Yes Historical Provider, MD  Calcium-Vitamin D 600-200 MG-UNIT per tablet Take 1 tablet by mouth  2 (two) times daily. 03/26/12  Yes Brent Bulla, MD  colchicine 0.6 MG tablet Take 0.6 mg by mouth 2 (two) times daily as needed (gout).   Yes Historical Provider, MD  folic acid (FOLVITE) 1 MG tablet Take 1 tablet (1 mg total) by mouth daily. 04/20/12  Yes Brent Bulla, MD  mirtazapine (REMERON) 15 MG tablet Take 1 tablet (15 mg total) by mouth at bedtime. 03/26/12  Yes Brent Bulla, MD  omeprazole (PRILOSEC) 20 MG capsule Take 20 mg by mouth daily. 01/29/13  Yes Historical Provider, MD  propranolol (INDERAL) 10 MG tablet Take 1 tablet (10 mg total) by mouth 2 (two) times daily. 03/26/12  Yes Brent Bulla, MD  spironolactone (ALDACTONE) 50 MG tablet Take 50 mg by mouth daily.   Yes Historical  Provider, MD  thiamine (VITAMIN B-1) 100 MG tablet Take 1 tablet (100 mg total) by mouth daily. 04/20/12  Yes Brent Bulla, MD  traMADol (ULTRAM) 50 MG tablet Take 50 mg by mouth every 8 (eight) hours as needed for moderate pain or severe pain.  09/10/14  Yes Historical Provider, MD  furosemide (LASIX) 40 MG tablet Take 1 tablet (40 mg total) by mouth 2 (two) times daily. Patient not taking: Reported on 09/29/2014 03/26/12   Brent Bulla, MD   BP 104/73 mmHg  Pulse 76  Temp(Src) 98.6 F (37 C) (Oral)  Resp 16  SpO2 94% Physical Exam  Constitutional: She is oriented to person, place, and time. She appears well-developed and well-nourished. No distress.  HENT:  Head: Normocephalic and atraumatic.  Right Ear: Hearing normal.  Left Ear: Hearing normal.  Nose: Nose normal.  Mouth/Throat: Oropharynx is clear and moist and mucous membranes are normal.  Eyes: Conjunctivae and EOM are normal. Pupils are equal, round, and reactive to light.  Neck: Normal range of motion. Neck supple.  Cardiovascular: Regular rhythm, S1 normal and S2 normal.  Exam reveals no gallop and no friction rub.   No murmur heard. Pulmonary/Chest: Effort normal and breath sounds normal. No respiratory distress. She exhibits no tenderness.  Abdominal: Soft. Normal appearance and bowel sounds are normal. There is no hepatosplenomegaly. There is no tenderness. There is no rebound, no guarding, no tenderness at McBurney's point and negative Murphy's sign. No hernia.  Musculoskeletal: Normal range of motion.       Right elbow: She exhibits normal range of motion, no swelling and no effusion.       Right hip: She exhibits tenderness. She exhibits normal range of motion.  Neurological: She is alert and oriented to person, place, and time. She has normal strength. No cranial nerve deficit or sensory deficit. Coordination normal. GCS eye subscore is 4. GCS verbal subscore is 5. GCS motor subscore is 6.  Skin: Skin is warm, dry and  intact. No rash noted. No cyanosis.  Psychiatric: She has a normal mood and affect. Her speech is normal and behavior is normal. Thought content normal.  Nursing note and vitals reviewed.   ED Course  Procedures (including critical care time) Labs Review Labs Reviewed  CBC WITH DIFFERENTIAL/PLATELET - Abnormal; Notable for the following:    RBC 3.59 (*)    Hemoglobin 11.7 (*)    MCV 102.2 (*)    All other components within normal limits  BASIC METABOLIC PANEL - Abnormal; Notable for the following:    Sodium 133 (*)    Potassium 3.4 (*)    Chloride 93 (*)    Glucose, Bld  152 (*)    Calcium 8.8 (*)    All other components within normal limits  TROPONIN I    Imaging Review Dg Chest 1 View  09/29/2014   CLINICAL DATA:  Fall, loss of consciousness  EXAM: CHEST  1 VIEW  COMPARISON:  12/30/2013  FINDINGS: Lungs are clear.  No pleural effusion or pneumothorax.  The heart is normal in size.  Left lower rib deformities.  IMPRESSION: No evidence of acute cardiopulmonary disease.   Electronically Signed   By: Charline Bills M.D.   On: 09/29/2014 13:32   Dg Elbow Complete Right  09/29/2014   CLINICAL DATA:  Elbow surgery 3 years ago. Abrasions and hematoma along the right elbow after a fall.  EXAM: RIGHT ELBOW - COMPLETE 3+ VIEW  COMPARISON:  08/02/2011  FINDINGS: Extensive fixation hardware along the olecranon and distal humerus. Bony deformity from prior fractures. No hardware complicating feature is identified.  Slightly indistinct anterior fat pad but not appreciably widened on the lateral projection. Posterior fat pad not visible, although possibly partially obscured by one of the plate and screw fixators.  On the lateral projection only, there is some slight malalignment at the radio capitellar articulation. Spurring of the radial head.  I do not observe a well-defined radial fracture. Supinator fat pad is irregular and anteriorly convex, although the significance of this in light of all of  the postoperative findings is uncertain.  IMPRESSION: 1. No new fracture is identified. 2. There is mild radiocapitellar malalignment on the lateral projection, with the axis of the radius some what proximal to the capitellum. This could indicate radiocapitellar instability. 3. No definite elbow joint effusion, but indistinctness of the anterior fat pad which may partially be postoperative. 4. No hardware failure identified.   Electronically Signed   By: Gaylyn Rong M.D.   On: 09/29/2014 15:42   Ct Head Wo Contrast  09/29/2014   CLINICAL DATA:  Fall  EXAM: CT HEAD WITHOUT CONTRAST  TECHNIQUE: Contiguous axial images were obtained from the base of the skull through the vertex without intravenous contrast.  COMPARISON:  None.  FINDINGS: No skull fracture is noted. No intracranial hemorrhage, mass effect or midline shift. Mild cerebral atrophy. There is mucosal thickening with almost complete opacification of the left sphenoid sinus. The mastoid air cells are unremarkable. No acute cortical infarction. No mass lesion is noted on this unenhanced scan.  IMPRESSION: No acute intracranial abnormality. Mild cerebral atrophy. There is mucosal thickening with almost complete opacification of left sphenoid sinus.   Electronically Signed   By: Natasha Mead M.D.   On: 09/29/2014 13:35   Ct Hip Right Wo Contrast  09/29/2014   CLINICAL DATA:  Right hip pain.  The patient was found down.  EXAM: CT OF THE RIGHT HIP WITHOUT CONTRAST  TECHNIQUE: Multidetector CT imaging of the right hip was performed according to the standard protocol. Multiplanar CT image reconstructions were also generated.  COMPARISON:  CT scan dated 01/02/2012  FINDINGS: There is a new intertrochanteric fracture of the proximal right femur. There is no angulation or displacement. The fracture extends into the proximal aspect of the greater trochanter.  The femoral and acetabular components of the hip prosthesis demonstrate no loosening. No acetabular  fracture. Slight chronic protrusio deformity. The visualized pelvic bones are normal. Marked atrophy of the right iliopsoas muscle.  IMPRESSION: New nondisplaced intertrochanteric fracture of the proximal right femur.   Electronically Signed   By: Francene Boyers M.D.   On: 09/29/2014  14:34   Dg Hip Unilat With Pelvis 2-3 Views Right  09/29/2014   CLINICAL DATA:  Fall, loss of consciousness, right hip pain, initial encounter.  EXAM: RIGHT HIP (WITH PELVIS) 2-3 VIEWS  COMPARISON:  Left hip 06/26/2013.  FINDINGS: Right hip arthroplasty. Lucency is seen along the inferior aspect of the right greater trochanter, new from 06/26/2013. Remote left superior and inferior pubic rami fractures are seen. Osteopenia. Left hip is otherwise unremarkable.  IMPRESSION: 1. Lucency along the inferior aspect of the right greater trochanter, adjacent to a right hip arthroplasty, highly suspicious for a fracture. 2. Posttraumatic deformity of the left obturator ring.   Electronically Signed   By: Leanna Battles M.D.   On: 09/29/2014 13:36     EKG Interpretation   Date/Time:  Monday September 29 2014 12:16:41 EDT Ventricular Rate:  81 PR Interval:  164 QRS Duration: 90 QT Interval:  420 QTC Calculation: 487 R Axis:   116 Text Interpretation:  Sinus rhythm Probable right ventricular hypertrophy  Inferior infarct, old Abnrm T, consider ischemia, anterolateral lds  Baseline wander in lead(s) V2 No significant change since last tracing  Confirmed by Zuhayr Deeney  MD, Franz Svec [phone]) on 09/29/2014 1:40:01 PM      MDM   Final diagnoses:  Fall  Fall  Fall   unintentional overdose  Right hip fracture  She presents to the ER for evaluation after a fall. Patient was found bradypneic and unresponsive. Patient's pupils were pinpoint. She immediately aroused after Narcan. Patient denies intentional overdose. She has remained awake and alert throughout the evaluation here in the ER. Head CT was normal. Cardiac evaluation did  not show any significant abnormality. X-ray of hip did show a lucency that was suspicious for fracture. This was confirmed by CT scan.  As was discussed with Dr. Cleophas Dunker, covering for Dr. Lajoyce Corners. He did look at the images and confirmed that the patient could bear weight with a walker and follow-up in the office, no surgical intervention necessary.    Gilda Crease, MD 09/29/14 1546  Gilda Crease, MD 10/13/14 0745  Gilda Crease, MD 10/13/14 [id number]  Gilda Crease, MD 10/22/14 [phone]

## 2014-12-23 ENCOUNTER — Other Ambulatory Visit (HOSPITAL_COMMUNITY): Payer: Self-pay | Admitting: Orthopedic Surgery

## 2014-12-25 ENCOUNTER — Other Ambulatory Visit (HOSPITAL_COMMUNITY)
Admission: RE | Admit: 2014-12-25 | Discharge: 2014-12-25 | Disposition: A | Discharge status: Home / Self Care | Payer: Medicare Other | Source: Ambulatory Visit | Attending: Family Medicine | Admitting: Family Medicine

## 2014-12-25 ENCOUNTER — Encounter (HOSPITAL_COMMUNITY)
Admission: RE | Admit: 2014-12-25 | Discharge: 2014-12-25 | Disposition: A | Discharge status: Home / Self Care | Payer: Medicare Other | Source: Ambulatory Visit | Attending: Orthopedic Surgery | Admitting: Orthopedic Surgery

## 2014-12-25 ENCOUNTER — Encounter (HOSPITAL_COMMUNITY): Payer: Self-pay

## 2014-12-25 ENCOUNTER — Other Ambulatory Visit: Payer: Self-pay | Admitting: Family Medicine

## 2014-12-25 DIAGNOSIS — I272 Other secondary pulmonary hypertension: Secondary | ICD-10-CM | POA: Diagnosis not present

## 2014-12-25 DIAGNOSIS — W19XXXA Unspecified fall, initial encounter: Secondary | ICD-10-CM | POA: Diagnosis not present

## 2014-12-25 DIAGNOSIS — S52572A Other intraarticular fracture of lower end of left radius, initial encounter for closed fracture: Secondary | ICD-10-CM | POA: Diagnosis not present

## 2014-12-25 DIAGNOSIS — R188 Other ascites: Secondary | ICD-10-CM | POA: Diagnosis not present

## 2014-12-25 DIAGNOSIS — B75 Trichinellosis: Secondary | ICD-10-CM | POA: Diagnosis present

## 2014-12-25 DIAGNOSIS — I252 Old myocardial infarction: Secondary | ICD-10-CM | POA: Diagnosis not present

## 2014-12-25 DIAGNOSIS — I10 Essential (primary) hypertension: Secondary | ICD-10-CM | POA: Diagnosis not present

## 2014-12-25 HISTORY — DX: Acute myocardial infarction, unspecified: I21.9

## 2014-12-25 HISTORY — DX: Osteomyelitis, unspecified: M86.9

## 2014-12-25 LAB — CBC
HCT: 35.8 % — ABNORMAL LOW (ref 36.0–46.0)
Hemoglobin: 11.9 g/dL — ABNORMAL LOW (ref 12.0–15.0)
MCH: 32.1 pg (ref 26.0–34.0)
MCHC: 33.2 g/dL (ref 30.0–36.0)
MCV: 96.5 fL (ref 78.0–100.0)
PLATELETS: 187 10*3/uL (ref 150–400)
RBC: 3.71 MIL/uL — AB (ref 3.87–5.11)
RDW: 13.4 % (ref 11.5–15.5)
WBC: 7 10*3/uL (ref 4.0–10.5)

## 2014-12-25 LAB — COMPREHENSIVE METABOLIC PANEL
ALT: 12 U/L — AB (ref 14–54)
AST: 30 U/L (ref 15–41)
Albumin: 3.4 g/dL — ABNORMAL LOW (ref 3.5–5.0)
Alkaline Phosphatase: 124 U/L (ref 38–126)
Anion gap: 15 (ref 5–15)
BUN: 18 mg/dL (ref 6–20)
CHLORIDE: 91 mmol/L — AB (ref 101–111)
CO2: 26 mmol/L (ref 22–32)
CREATININE: 1.42 mg/dL — AB (ref 0.44–1.00)
Calcium: 8.9 mg/dL (ref 8.9–10.3)
GFR calc non Af Amer: 39 mL/min — ABNORMAL LOW (ref >60)
GFR, EST AFRICAN AMERICAN: 45 mL/min — AB (ref >60)
Glucose, Bld: 106 mg/dL — ABNORMAL HIGH (ref 65–99)
POTASSIUM: 3.3 mmol/L — AB (ref 3.5–5.1)
SODIUM: 132 mmol/L — AB (ref 135–145)
Total Bilirubin: 1 mg/dL (ref 0.3–1.2)
Total Protein: 7.4 g/dL (ref 6.5–8.1)

## 2014-12-25 LAB — PROTIME-INR
INR: 1.12 (ref 0.00–1.49)
Prothrombin Time: 14.5 seconds (ref 11.6–15.2)

## 2014-12-25 NOTE — Progress Notes (Signed)
Mrs Kathryn [name] fell backwards while walking up stairs with a picture in her hand.  Patient denies lightheadness, shortness of breath or chest pain. Patient reports that she has been on Inderal for a long time "for irregular heartbeat."  Patient does not see a cardiologist.

## 2014-12-25 NOTE — Progress Notes (Signed)
Anesthesia Chart Review:  Pt is 64 year old female scheduled for ORIF L wrist fracture on 12/26/2014 with Dr. August Saucer.   PMH includes: NSTEMI (12/2011), HTN, irregular heart beat (by notes, tachycardia on holter monitor in 2008), PVD, hyperlipidemia, alcoholic liver disease, pancreatitis, GERD. Never smoker. BMI 22. S/p EGD 08/22/12. S/p transmetatarsal amputation 02/16/12. S/p ORIF distal humerus fracture 07/23/11.  ER visit 09/29/2014 for unintentional overdose, fall, and R hip fracture (not requiring surgery).   Pt hospitalized 9/16-9/26/2013 for septic shock, suffered NSTEMI during hospitalization. Was to f/u with Dr. Antoine Poche with cardiology after discharge but this does not appear to have happened.   Medications include: lasix, remeron, dilaudid, prilosec, propranolol, spironolactone.   Preoperative labs reviewed.    Chest x-ray 12/31/2013 reviewed. Linear atelectasis or scarring in the right lower lobe and stable mild cardiomegaly.  EKG 09/29/2014: Sinus rhythm. Probable right ventricular hypertrophy. Inferior infarct, old. Abnrm T, consider ischemia, anterolateral lds. Baseline wander in lead(s) V2. No significant change since last tracing 12/30/2013. T abnormality found on EKGs dating back to 2004.   Echo 01/03/2012:  - Left ventricle: The cavity size was normal. There was mild focal basal hypertrophy of the septum. Systolic function was normal. The estimated ejection fraction was in the range of 55% to 60%. Although no diagnostic regional wall motion abnormality was identified, this possibility cannot be completely excluded on the basis of this study. Features are consistent with a pseudonormal left ventricular filling pattern, with concomitant abnormal relaxation and increased filling pressure (grade 2 diastolic dysfunction). - Aortic valve: There was no stenosis. - Mitral valve: Mildly calcified annulus. Moderate regurgitation. - Left atrium: The atrium was mildly dilated. - Right ventricle: The  cavity size was normal. Systolic function was normal. - Tricuspid valve: Mild-moderate regurgitation. Peak RV-RA gradient: 27mm Hg (S). - Pulmonary arteries: PA systolic pressure 33-37 mmHg. - Systemic veins: IVC measured 1.9 cm with normal respirophasic variation suggesting RA pressure 6-10 mmHg. - Pericardium, extracardiac: Ascites noted. -Impressions: Normal LV size with mild focal basal septal hypertrophy. EF 55-60% with no definite wall motion abnormalities. Moderate diastolic dysfunction. Normal RV size and systolic function. Borderline pulmonary hypertension. Ascites noted.  Reviewed case/EKGs with Dr. Okey Dupre. Pt denied CP or CV sx at PAT.   If no changes, I anticipate pt can proceed with surgery as scheduled.   Rica Mast, FNP-BC [facility] Short Stay [facility]/Anesthesiology Phone: [phone] 12/25/2014 1:23 PM

## 2014-12-25 NOTE — Pre-Procedure Instructions (Addendum)
    Kathryn [name]  12/25/2014    Your procedure is scheduled on Friday, September 9.  Report to [facility] Admitting at 8:30 A.M.  Call this number if you have problems the morning of surgery:[phone]   Remember:  Do not eat food or drink liquids after midnight.  Take these medicines the morning of surgery with A SIP OF WATER: allopurinol (ZYLOPRIM), omeprazole (PRILOSEC), propranolol (INDERAL).                       Take if needed:HYDROmorphone (DILAUDID   Do not wear jewelry, make-up or nail polish.   Do not wear lotions, powders, or perfumes.                Do not shave 48 hours prior to surgery.     Do not bring valuables to the hospital.   [facility] is not responsible for any belongings or valuables.  Contacts, dentures or bridgework may not be worn into surgery.  Leave your suitcase in the car.  After surgery it may be brought to your room.  For patients admitted to the hospital, discharge time will be determined by your treatment team.  Patients discharged the day of surgery will not be allowed to drive home.   Name and phone number of your driver:   -  Special instructions:  Review  [facility] - Preparing For Surgery.  Please read over the following fact sheets that you were given. Pain Booklet, Coughing and Deep Breathing and Surgical Site Infection Prevention

## 2014-12-26 ENCOUNTER — Ambulatory Visit (HOSPITAL_COMMUNITY): Payer: Medicare Other | Admitting: Emergency Medicine

## 2014-12-26 ENCOUNTER — Encounter (HOSPITAL_COMMUNITY): Payer: Self-pay | Admitting: Certified Registered Nurse Anesthetist

## 2014-12-26 ENCOUNTER — Encounter (HOSPITAL_COMMUNITY)
Admission: RE | Disposition: A | Discharge status: Home / Self Care | Payer: Self-pay | Source: Ambulatory Visit | Attending: Orthopedic Surgery

## 2014-12-26 ENCOUNTER — Ambulatory Visit (HOSPITAL_COMMUNITY)
Admission: RE | Admit: 2014-12-26 | Discharge: 2014-12-26 | Disposition: A | Discharge status: Home / Self Care | Payer: Medicare Other | Source: Ambulatory Visit | Attending: Orthopedic Surgery | Admitting: Orthopedic Surgery

## 2014-12-26 ENCOUNTER — Ambulatory Visit (HOSPITAL_COMMUNITY): Payer: Medicare Other | Admitting: Certified Registered Nurse Anesthetist

## 2014-12-26 DIAGNOSIS — I272 Other secondary pulmonary hypertension: Secondary | ICD-10-CM | POA: Diagnosis not present

## 2014-12-26 DIAGNOSIS — I10 Essential (primary) hypertension: Secondary | ICD-10-CM | POA: Diagnosis not present

## 2014-12-26 DIAGNOSIS — S52572A Other intraarticular fracture of lower end of left radius, initial encounter for closed fracture: Secondary | ICD-10-CM | POA: Insufficient documentation

## 2014-12-26 DIAGNOSIS — R188 Other ascites: Secondary | ICD-10-CM | POA: Insufficient documentation

## 2014-12-26 DIAGNOSIS — I252 Old myocardial infarction: Secondary | ICD-10-CM | POA: Diagnosis not present

## 2014-12-26 DIAGNOSIS — W19XXXA Unspecified fall, initial encounter: Secondary | ICD-10-CM | POA: Insufficient documentation

## 2014-12-26 HISTORY — PX: ORIF WRIST FRACTURE: SHX2133

## 2014-12-26 SURGERY — OPEN REDUCTION INTERNAL FIXATION (ORIF) WRIST FRACTURE
Anesthesia: Monitor Anesthesia Care | Site: Wrist | Laterality: Left

## 2014-12-26 MED ORDER — PROPOFOL 10 MG/ML IV BOLUS
INTRAVENOUS | Status: AC
  Filled 2014-12-26: qty 20

## 2014-12-26 MED ORDER — MIDAZOLAM HCL 2 MG/2ML IJ SOLN
INTRAMUSCULAR | Status: AC
  Filled 2014-12-26: qty 2

## 2014-12-26 MED ORDER — LACTATED RINGERS IV SOLN
INTRAVENOUS | Status: DC | PRN
  Administered 2014-12-26: 09:00:00 via INTRAVENOUS

## 2014-12-26 MED ORDER — PROPRANOLOL HCL 10 MG PO TABS
10.0000 mg | ORAL_TABLET | Freq: Once | ORAL | Status: AC
  Administered 2014-12-26: 10 mg via ORAL
  Filled 2014-12-26: qty 1

## 2014-12-26 MED ORDER — PROMETHAZINE HCL 25 MG/ML IJ SOLN
6.2500 mg | INTRAMUSCULAR | Status: DC | PRN
Start: 2014-12-26 — End: 2014-12-26

## 2014-12-26 MED ORDER — MIDAZOLAM HCL 2 MG/2ML IJ SOLN
INTRAMUSCULAR | Status: AC
  Filled 2014-12-26: qty 4

## 2014-12-26 MED ORDER — CHLORHEXIDINE GLUCONATE 4 % EX LIQD: 60.0000 mL | Freq: Once | CUTANEOUS | Status: DC

## 2014-12-26 MED ORDER — LACTATED RINGERS IV SOLN
INTRAVENOUS | Status: DC
  Administered 2014-12-26: 09:00:00 via INTRAVENOUS

## 2014-12-26 MED ORDER — BUPIVACAINE-EPINEPHRINE (PF) 0.5% -1:200000 IJ SOLN
INTRAMUSCULAR | Status: DC | PRN
  Administered 2014-12-26: 30 mL via PERINEURAL

## 2014-12-26 MED ORDER — FUROSEMIDE 40 MG PO TABS: 40.0000 mg | ORAL_TABLET | Freq: Every day | ORAL | Status: DC

## 2014-12-26 MED ORDER — ONDANSETRON HCL 4 MG/2ML IJ SOLN
INTRAMUSCULAR | Status: DC | PRN
  Administered 2014-12-26: 4 mg via INTRAVENOUS

## 2014-12-26 MED ORDER — ONDANSETRON HCL 4 MG/2ML IJ SOLN
INTRAMUSCULAR | Status: AC
  Filled 2014-12-26: qty 2

## 2014-12-26 MED ORDER — PROPOFOL INFUSION 10 MG/ML OPTIME
INTRAVENOUS | Status: DC | PRN
  Administered 2014-12-26: 25 ug/kg/min via INTRAVENOUS

## 2014-12-26 MED ORDER — MIDAZOLAM HCL 5 MG/5ML IJ SOLN
INTRAMUSCULAR | Status: DC | PRN
  Administered 2014-12-26: 2 mg via INTRAVENOUS

## 2014-12-26 MED ORDER — LIDOCAINE HCL (CARDIAC) 20 MG/ML IV SOLN
INTRAVENOUS | Status: AC
  Filled 2014-12-26: qty 5

## 2014-12-26 MED ORDER — MEPERIDINE HCL 25 MG/ML IJ SOLN: 6.2500 mg | INTRAMUSCULAR | Status: DC | PRN

## 2014-12-26 MED ORDER — FENTANYL CITRATE (PF) 100 MCG/2ML IJ SOLN
INTRAMUSCULAR | Status: AC
  Administered 2014-12-26: 50 ug
  Filled 2014-12-26: qty 2

## 2014-12-26 MED ORDER — MIDAZOLAM HCL 2 MG/2ML IJ SOLN
INTRAMUSCULAR | Status: AC
  Administered 2014-12-26: 2 mg
  Filled 2014-12-26: qty 2

## 2014-12-26 MED ORDER — CEFAZOLIN SODIUM-DEXTROSE 2-3 GM-% IV SOLR
INTRAVENOUS | Status: AC
  Administered 2014-12-26: 2 g via INTRAVENOUS
  Filled 2014-12-26: qty 50

## 2014-12-26 MED ORDER — FENTANYL CITRATE (PF) 250 MCG/5ML IJ SOLN
INTRAMUSCULAR | Status: AC
  Filled 2014-12-26: qty 5

## 2014-12-26 MED ORDER — HYDROMORPHONE HCL 1 MG/ML IJ SOLN: 0.2500 mg | INTRAMUSCULAR | Status: DC | PRN

## 2014-12-26 MED ORDER — CHLORHEXIDINE GLUCONATE 4 % EX LIQD
60.0000 mL | Freq: Once | CUTANEOUS | Status: DC
Start: 2014-12-26 — End: 2014-12-26

## 2014-12-26 MED ORDER — 0.9 % SODIUM CHLORIDE (POUR BTL) OPTIME
TOPICAL | Status: DC | PRN
  Administered 2014-12-26: 1000 mL

## 2014-12-26 MED ORDER — CEFAZOLIN SODIUM-DEXTROSE 2-3 GM-% IV SOLR: 2.0000 g | INTRAVENOUS | Status: DC

## 2014-12-26 SURGICAL SUPPLY — 72 items
BANDAGE ELASTIC 4 VELCRO ST LF (GAUZE/BANDAGES/DRESSINGS) ×3 IMPLANT
BIT DRILL 2.2 SS TIBIAL (BIT) ×3 IMPLANT
BLADE SURG 10 STRL SS (BLADE) ×3 IMPLANT
BNDG ESMARK 4X9 LF (GAUZE/BANDAGES/DRESSINGS) IMPLANT
BNDG GAUZE ELAST 4 BULKY (GAUZE/BANDAGES/DRESSINGS) IMPLANT
CANISTER SUCTION 2500CC (MISCELLANEOUS) ×3 IMPLANT
CLOSURE WOUND 1/2 X4 (GAUZE/BANDAGES/DRESSINGS) ×1
CORDS BIPOLAR (ELECTRODE) ×3 IMPLANT
COVER SURGICAL LIGHT HANDLE (MISCELLANEOUS) ×3 IMPLANT
CUFF TOURNIQUET SINGLE 18IN (TOURNIQUET CUFF) ×3 IMPLANT
CUFF TOURNIQUET SINGLE 24IN (TOURNIQUET CUFF) IMPLANT
DRAIN TLS ROUND 10FR (DRAIN) IMPLANT
DRAPE INCISE IOBAN 66X45 STRL (DRAPES) ×3 IMPLANT
DRAPE OEC MINIVIEW 54X84 (DRAPES) ×3 IMPLANT
DRAPE U-SHAPE 47X51 STRL (DRAPES) ×3 IMPLANT
DRSG PAD ABDOMINAL 8X10 ST (GAUZE/BANDAGES/DRESSINGS) ×3 IMPLANT
DURAPREP 26ML APPLICATOR (WOUND CARE) ×3 IMPLANT
ELECT REM PT RETURN 9FT ADLT (ELECTROSURGICAL) ×3
ELECTRODE REM PT RTRN 9FT ADLT (ELECTROSURGICAL) ×1 IMPLANT
FACESHIELD WRAPAROUND (MASK) ×3 IMPLANT
GAUZE SPONGE 4X4 12PLY STRL (GAUZE/BANDAGES/DRESSINGS) IMPLANT
GAUZE XEROFORM 1X8 LF (GAUZE/BANDAGES/DRESSINGS) ×3 IMPLANT
GLOVE BIO SURGEON ST LM GN SZ9 (GLOVE) ×3 IMPLANT
GLOVE BIOGEL PI IND STRL 8 (GLOVE) ×1 IMPLANT
GLOVE BIOGEL PI INDICATOR 8 (GLOVE) ×2
GLOVE SURG ORTHO 8.0 STRL STRW (GLOVE) ×3 IMPLANT
GOWN STRL REUS W/ TWL LRG LVL3 (GOWN DISPOSABLE) ×2 IMPLANT
GOWN STRL REUS W/ TWL XL LVL3 (GOWN DISPOSABLE) ×1 IMPLANT
GOWN STRL REUS W/TWL LRG LVL3 (GOWN DISPOSABLE) ×4
GOWN STRL REUS W/TWL XL LVL3 (GOWN DISPOSABLE) ×2
K-WIRE 1.6 (WIRE) ×8
K-WIRE FX5X1.6XNS BN SS (WIRE) ×4
KIT BASIN OR (CUSTOM PROCEDURE TRAY) ×3 IMPLANT
KIT ROOM TURNOVER OR (KITS) ×3 IMPLANT
KWIRE FX5X1.6XNS BN SS (WIRE) ×4 IMPLANT
MANIFOLD NEPTUNE II (INSTRUMENTS) IMPLANT
NEEDLE 22X1 1/2 (OR ONLY) (NEEDLE) IMPLANT
NS IRRIG 1000ML POUR BTL (IV SOLUTION) ×3 IMPLANT
PACK ORTHO EXTREMITY (CUSTOM PROCEDURE TRAY) ×3 IMPLANT
PAD ARMBOARD 7.5X6 YLW CONV (MISCELLANEOUS) ×6 IMPLANT
PAD CAST 3X4 CTTN HI CHSV (CAST SUPPLIES) ×1 IMPLANT
PAD CAST 4YDX4 CTTN HI CHSV (CAST SUPPLIES) ×1 IMPLANT
PADDING CAST COTTON 3X4 STRL (CAST SUPPLIES) ×2
PADDING CAST COTTON 4X4 STRL (CAST SUPPLIES) ×2
PEG LOCKING SMOOTH 2.2X18 (Peg) ×3 IMPLANT
PEG LOCKING SMOOTH 2.2X20 (Screw) ×15 IMPLANT
PEG LOCKING SMOOTH 2.2X22 (Screw) ×3 IMPLANT
PLATE STANDARD DVR LEFT (Plate) ×3 IMPLANT
PLATE STD DVR LT 24X51 (Plate) ×1 IMPLANT
SCREW LOCK 14X2.7X 3 LD TPR (Screw) ×1 IMPLANT
SCREW LOCKING 2.7X14 (Screw) ×2 IMPLANT
SCREW LOCKING 2.7X15MM (Screw) ×6 IMPLANT
SLING ARM FOAM STRAP MED (SOFTGOODS) ×3 IMPLANT
SPLINT FIBERGLASS 4X15 (CAST SUPPLIES) ×3 IMPLANT
SPONGE GAUZE 4X4 12PLY STER LF (GAUZE/BANDAGES/DRESSINGS) ×3 IMPLANT
SPONGE LAP 4X18 X RAY DECT (DISPOSABLE) ×6 IMPLANT
STAPLER VISISTAT 35W (STAPLE) IMPLANT
STOCKINETTE IMPERVIOUS 9X36 MD (GAUZE/BANDAGES/DRESSINGS) ×3 IMPLANT
STRIP CLOSURE SKIN 1/2X4 (GAUZE/BANDAGES/DRESSINGS) ×2 IMPLANT
SUCTION FRAZIER TIP 10 FR DISP (SUCTIONS) ×3 IMPLANT
SUT ETHILON 3 0 PS 1 (SUTURE) ×3 IMPLANT
SUT PROLENE 3 0 PS 1 (SUTURE) IMPLANT
SUT VIC AB 2-0 CTB1 (SUTURE) IMPLANT
SUT VIC AB 3-0 X1 27 (SUTURE) ×3 IMPLANT
SYR CONTROL 10ML LL (SYRINGE) IMPLANT
SYSTEM CHEST DRAIN TLS 7FR (DRAIN) IMPLANT
TOWEL OR 17X24 6PK STRL BLUE (TOWEL DISPOSABLE) ×3 IMPLANT
TOWEL OR 17X26 10 PK STRL BLUE (TOWEL DISPOSABLE) ×3 IMPLANT
TUBE CONNECTING 12'X1/4 (SUCTIONS) ×1
TUBE CONNECTING 12X1/4 (SUCTIONS) ×2 IMPLANT
WATER STERILE IRR 1000ML POUR (IV SOLUTION) ×3 IMPLANT
YANKAUER SUCT BULB TIP NO VENT (SUCTIONS) IMPLANT

## 2014-12-26 NOTE — Anesthesia Procedure Notes (Addendum)
Procedure Name: MAC Date/Time: 12/26/2014 11:45 AM Performed by: Rise Patience T Pre-anesthesia Checklist: Patient identified, Emergency Drugs available, Suction available and Patient being monitored Patient Re-evaluated:Patient Re-evaluated prior to inductionOxygen Delivery Method: Simple face mask Preoxygenation: Pre-oxygenation with 100% oxygen Intubation Type: IV induction Placement Confirmation: positive ETCO2 and breath sounds checked- equal and bilateral Dental Injury: Teeth and Oropharynx as per pre-operative assessment    Anesthesia Regional Block:  Axillary brachial plexus block  Pre-Anesthetic Checklist: ,, timeout performed, Correct Patient, Correct Site, Correct Laterality, Correct Procedure, Correct Position, site marked, Risks and benefits discussed, Surgical consent,  Pre-op evaluation,  Post-op pain management  Laterality: Left  Prep: chloraprep       Needles:  Injection technique: Single-shot  Needle Type: Stimulator Needle - 40     Needle Length: 4cm 4 cm Needle Gauge: 22 and 22 G    Additional Needles:  Procedures: ultrasound guided (picture in chart) Axillary brachial plexus block Narrative:  Injection made incrementally with aspirations every 5 mL. Anesthesiologist: Lewie Loron  Additional Notes: BP cuff, EKG monitors applied. Sedation begun. Nerve location verified with U/S. Anesthetic injected incrementally, slowly , and after neg aspirations under direct u/s guidance. Good perineural spread. Tolerated well.

## 2014-12-26 NOTE — Anesthesia Preprocedure Evaluation (Addendum)
Anesthesia Evaluation  Patient identified by MRN, date of birth, ID band Patient awake    Reviewed: Allergy & Precautions, NPO status , Patient's Chart, lab work & pertinent test results, reviewed documented beta blocker date and time   Airway Mallampati: II  TM Distance: >3 FB Neck ROM: Full    Dental no notable dental hx. (+) Dental Advisory Given, Partial Upper, Poor Dentition   Pulmonary shortness of breath,    Pulmonary exam normal breath sounds clear to auscultation       Cardiovascular hypertension, Pt. on medications and Pt. on home beta blockers + Past MI and + Peripheral Vascular Disease  Normal cardiovascular exam+ dysrhythmias  Rhythm:Regular Rate:Normal     Neuro/Psych    GI/Hepatic GERD  Medicated,(+) Cirrhosis   ascites  substance abuse  alcohol use,   Endo/Other    Renal/GU      Musculoskeletal  (+) Arthritis ,   Abdominal   Peds  Hematology   Anesthesia Other Findings   Reproductive/Obstetrics                           Anesthesia Physical Anesthesia Plan  ASA: III  Anesthesia Plan: Regional and MAC   Post-op Pain Management:    Induction: Intravenous  Airway Management Planned: Simple Face Mask  Additional Equipment:   Intra-op Plan:   Post-operative Plan:   Informed Consent: I have reviewed the patients History and Physical, chart, labs and discussed the procedure including the risks, benefits and alternatives for the proposed anesthesia with the patient or authorized representative who has indicated his/her understanding and acceptance.   Dental advisory given  Plan Discussed with: CRNA  Anesthesia Plan Comments:        Anesthesia Quick Evaluation

## 2014-12-26 NOTE — H&P (Signed)
Kathryn [name] is an 64 y.o. female.   Chief Complaint: Left wrist pain HPI: Kathryn [name] is a 64 year old female with left wrist pain. She fell approximately a week ago and describes left wrist pain and denies any other orthopedic complaints. Denies any shoulder or wrist symptoms on the left-hand side she has been ambulatory since her fall this is a mechanical trip and fall without syncope  Past Medical History  Diagnosis Date  . Osteoporosis   . Alcoholic liver disease   . High cholesterol   . Ascites 09/22/11    "first time for me"  . Pancreatitis   . History of blood transfusion 07/2011    "when I had elbow surgery"  . GERD (gastroesophageal reflux disease)   . Staph infection     left foot  . Fracture, humerus 07/2011    right  . Dysrhythmia     "irregular"  . Shortness of breath 09/22/11    "because of all the fluid that's built up in my stomach"  . Arthritis     "all over"  . Peripheral vascular disease   . Hypertension     not current  . Osteomyelitis     left foot  . Myocardial infarction     NSTEMI 12/2011 in setting of septic shock    Past Surgical History  Procedure Laterality Date  . Amputation      two toes on left foot  . Bunionectomy      both feet  . Arthroscopic repair acl      right knee  . Orif humerus fracture  07/26/2011    Procedure: OPEN REDUCTION INTERNAL FIXATION (ORIF) DISTAL HUMERUS FRACTURE;  Surgeon: Rozanna Box, MD;  Location: [facility];  Service: Orthopedics;  Laterality: Right;  . Total hip arthroplasty  ~ 2003    right  . Tubal ligation  1980  . Paracentesis  09/22/11    2.2L  . Esophagogastroduodenoscopy  09/23/2011    Procedure: ESOPHAGOGASTRODUODENOSCOPY (EGD);  Surgeon: Lear Ng, MD;  Location: [facility] ENDOSCOPY;  Service: Endoscopy;  Laterality: N/A;  . Transmetatarsal amputation  02/16/2012    Procedure: TRANSMETATARSAL AMPUTATION;  Surgeon: Newt Minion, MD;  Location: [facility];  Service: Orthopedics;  Laterality: Left;  . Fracture surgery   2013    Right elbow  . Joint replacement      right hip   . Esophagogastroduodenoscopy (egd) with propofol N/A 08/22/2012    Procedure: ESOPHAGOGASTRODUODENOSCOPY (EGD) WITH PROPOFOL;  Surgeon: Arta Silence, MD;  Location: [facility] ENDOSCOPY;  Service: Endoscopy;  Laterality: N/A;  . Leg surgery Left     Correction of unequal leg length    No family history on file. Social History:  reports that she has never smoked. She has never used smokeless tobacco. She reports that she does not drink alcohol or use illicit drugs.  Allergies:  Allergies  Allergen Reactions  . Codeine Hives    Causes blisters  . Tramadol Other (See Comments)    Loss of consciousness     No prescriptions prior to admission    Results for orders placed or performed during the hospital encounter of 12/25/14 (from the past 48 hour(s))  CBC     Status: Abnormal   Collection Time: 12/25/14  9:35 AM  Result Value Ref Range   WBC 7.0 4.0 - 10.5 K/uL   RBC 3.71 (L) 3.87 - 5.11 MIL/uL   Hemoglobin 11.9 (L) 12.0 - 15.0 g/dL   HCT 35.8 (L) 36.0 - 46.0 %  MCV 96.5 78.0 - 100.0 fL   MCH 32.1 26.0 - 34.0 pg   MCHC 33.2 30.0 - 36.0 g/dL   RDW 13.4 11.5 - 15.5 %   Platelets 187 150 - 400 K/uL  Comprehensive metabolic panel     Status: Abnormal   Collection Time: 12/25/14  9:35 AM  Result Value Ref Range   Sodium 132 (L) 135 - 145 mmol/L   Potassium 3.3 (L) 3.5 - 5.1 mmol/L   Chloride 91 (L) 101 - 111 mmol/L   CO2 26 22 - 32 mmol/L   Glucose, Bld 106 (H) 65 - 99 mg/dL   BUN 18 6 - 20 mg/dL   Creatinine, Ser 1.42 (H) 0.44 - 1.00 mg/dL   Calcium 8.9 8.9 - 10.3 mg/dL   Total Protein 7.4 6.5 - 8.1 g/dL   Albumin 3.4 (L) 3.5 - 5.0 g/dL   AST 30 15 - 41 U/L   ALT 12 (L) 14 - 54 U/L   Alkaline Phosphatase 124 38 - 126 U/L   Total Bilirubin 1.0 0.3 - 1.2 mg/dL   GFR calc non Af Amer 39 (L) >60 mL/min   GFR calc Af Amer 45 (L) >60 mL/min    Comment: (NOTE) The eGFR has been calculated using the CKD EPI equation. This  calculation has not been validated in all clinical situations. eGFR's persistently <60 mL/min signify possible Chronic Kidney Disease.    Anion gap 15 5 - 15  PT- INR at PAT visit (Pre-admission Testing)     Status: None   Collection Time: 12/25/14  9:35 AM  Result Value Ref Range   Prothrombin Time 14.5 11.6 - 15.2 seconds   INR 1.12 0.00 - 1.49   No results found.  Review of Systems  Constitutional: Negative.   HENT: Negative.   Eyes: Negative.   Respiratory: Negative.   Cardiovascular: Negative.   Gastrointestinal: Negative.   Genitourinary: Negative.   Musculoskeletal: Positive for joint pain.  Skin: Negative.   Neurological: Negative.   Endo/Heme/Allergies: Negative.   Psychiatric/Behavioral: Negative.     There were no vitals taken for this visit. Physical Exam  Constitutional: She appears well-developed.  HENT:  Head: Normocephalic.  Eyes: Pupils are equal, round, and reactive to light.  Neck: Normal range of motion.  Cardiovascular: Normal rate.   Respiratory: Effort normal.  Neurological: She is alert.  Skin: Skin is warm.  Psychiatric: She has a normal mood and affect.   left hand demonstrates intact EPL FPL function intact interosseous function hand is perfused and sensate elbow range of motion is full swelling is present of the distal radial region but the skin is intact   Assessment/Plan Impression is left distal radius fracture with angulation and intra-articular extension plan open reduction internal fixation risk and benefits discussed with the patient we will and to infection or vessel damage potential need for more surgery including plate removal tendon rupture extended rehabilitative process also described all questions answered  DEAN,GREGORY SCOTT 12/26/2014, 7:30 AM

## 2014-12-26 NOTE — Brief Op Note (Signed)
12/26/2014  1:23 PM  PATIENT:  Kathryn [name]  64 y.o. female  PRE-OPERATIVE DIAGNOSIS:  LEFT WRIST FRACTURE, Pt fell 9/5  POST-OPERATIVE DIAGNOSIS:  LEFT WRIST FRACTURE  PROCEDURE:  Procedure(s): OPEN REDUCTION INTERNAL FIXATION (ORIF) WRIST FRACTURE  SURGEON:  Surgeon(s): Cammy Copa, MD  ASSISTANT: none  ANESTHESIA:   regional  EBL: 10 ml    Total I/O In: 800 [I.V.:800] Out: 20 [Blood:20]  BLOOD ADMINISTERED: none  DRAINS: none   LOCAL MEDICATIONS USED:  none  SPECIMEN:  No Specimen  COUNTS:  YES  TOURNIQUET:   Total Tourniquet Time Documented: Upper Arm (Left) - 47 minutes Total: Upper Arm (Left) - 47 minutes   DICTATION: .Other Dictation: Dictation Number [id number]  PLAN OF CARE: Discharge to home after PACU  PATIENT DISPOSITION:  PACU - hemodynamically stable

## 2014-12-26 NOTE — Progress Notes (Signed)
Anitra Lauth RN returning partial to pt.

## 2014-12-26 NOTE — Transfer of Care (Signed)
Immediate Anesthesia Transfer of Care Note  Patient: Kathryn [name]  Procedure(s) Performed: Procedure(s): OPEN REDUCTION INTERNAL FIXATION (ORIF) WRIST FRACTURE (Left)  Patient Location: PACU  Anesthesia Type:MAC, Regional and MAC combined with regional for post-op pain  Level of Consciousness: awake, alert  and oriented  Airway & Oxygen Therapy: Patient Spontanous Breathing and Patient connected to nasal cannula oxygen  Post-op Assessment: Report given to RN, Post -op Vital signs reviewed and stable and Patient moving all extremities X 4  Post vital signs: Reviewed and stable  Last Vitals:  Filed Vitals:   12/26/14 1016  BP: 97/64  Pulse: 76  Temp:   Resp: 10    Complications: No apparent anesthesia complications

## 2014-12-26 NOTE — Progress Notes (Signed)
Pt has low bp. Asymptomatic. Dr.Germeroth notified. No new orders.

## 2014-12-26 NOTE — Anesthesia Postprocedure Evaluation (Signed)
Anesthesia Post Note  Patient: Kathryn [name]  Procedure(s) Performed: Procedure(s) (LRB): OPEN REDUCTION INTERNAL FIXATION (ORIF) WRIST FRACTURE (Left)  Anesthesia type: MAC + ax block  Patient location: PACU  Post pain: Pain level controlled  Post assessment: Post-op Vital signs reviewed  Last Vitals: BP 95/65 mmHg  Pulse 83  Temp(Src) 36.3 C (Oral)  Resp 16  Ht 4' 11.5" (1.511 m)  Wt 109 lb (49.442 kg)  BMI 21.66 kg/m2  SpO2 96%  Post vital signs: Reviewed  Level of consciousness: awake  Complications: No apparent anesthesia complications

## 2014-12-27 NOTE — Op Note (Signed)
[name], Kathryn [name] Kirandeep                   ACCOUNT NO.:  [id number]  MEDICAL RECORD NO.:  [id number]  LOCATION:  [facility]                         FACILITY:  [facility]  PHYSICIAN:  Burnard Bunting, M.D.    DATE OF BIRTH:  01-May-1950  DATE OF PROCEDURE:  12/26/2014 DATE OF DISCHARGE:  12/26/2014                              OPERATIVE REPORT   PREOPERATIVE DIAGNOSIS:  Left distal radius fracture, 3-part, intra- articular.  POSTOPERATIVE DIAGNOSIS:  Left distal radius fracture, 3-part, intra- articular.  PROCEDURE:  Open reduction and internal fixation of left intra-articular distal radius fracture.  SURGEON:  Burnard Bunting, M.D.  ASSISTANT:  April Chilton Si, RNFA.  INDICATIONS:  [name] is the patient, who fell on her left wrist this week. She presents for operative management after explanation of risks and benefits.  PROCEDURE IN DETAIL:  The patient was brought to the operating room where regional anesthetic was induced.  Preop IV antibiotics were administered.  Time-out was called.  Left arm was prescrubbed with alcohol and Betadine, allowed to air dry, prepped with DuraPrep solution and draped in a sterile manner.  [name] was used to cover the operative field.  Arm was elevated and exsanguinated with Esmarch wrap to 250 mmHg.  Total tourniquet time 47 minutes.  Incision was made over the FCR tendon extending to the proximal wrist flexion crease.  Skin and subcutaneous tissue were sharply divided.  The FCR tendon was mobilized radially before the sheath was incised.  Median nerve and radial artery and vein were protected all times.  Pronator quadratus incised on the radial aspect of the distal radius.  The periosteal elevation was performed.  Fracture was identified, reduced, K-wires were required in order to restore the volar tilt of the articular surface.  Biomet standard plate was applied with good bone purchase achieved and restoration of distal radial height, inclination, and tilt.  At  this time, tourniquet was released.  Bleeding points were encountered and controlled with electrocautery.  Fluoroscopy was utilized to confirm in the AP and lateral planes the reduction of the fracture.  The patient tolerated the procedure well without immediate complication.  Incision was closed using 3-0 Vicryl and 3-0 nylon.  Well-padded splint applied. The patient tolerated the procedure well without immediate complication.     Burnard Bunting, M.D.     GSD/MEDQ  D:  12/26/2014  T:  12/27/2014  Job:  [id number]

## 2014-12-29 ENCOUNTER — Encounter (HOSPITAL_COMMUNITY): Payer: Self-pay | Admitting: Orthopedic Surgery

## 2014-12-29 LAB — CYTOLOGY - PAP

## 2015-02-05 ENCOUNTER — Other Ambulatory Visit: Payer: Self-pay | Admitting: Orthopedic Surgery

## 2015-02-05 DIAGNOSIS — M25561 Pain in right knee: Secondary | ICD-10-CM

## 2015-02-10 ENCOUNTER — Other Ambulatory Visit: Payer: Self-pay | Admitting: Gastroenterology

## 2015-02-10 DIAGNOSIS — K869 Disease of pancreas, unspecified: Secondary | ICD-10-CM

## 2015-02-21 ENCOUNTER — Ambulatory Visit
Admission: RE | Admit: 2015-02-21 | Discharge: 2015-02-21 | Disposition: A | Discharge status: Home / Self Care | Payer: Medicare Other | Source: Ambulatory Visit | Attending: Orthopedic Surgery | Admitting: Orthopedic Surgery

## 2015-02-21 DIAGNOSIS — M25561 Pain in right knee: Secondary | ICD-10-CM

## 2015-02-26 ENCOUNTER — Ambulatory Visit
Admission: RE | Admit: 2015-02-26 | Discharge: 2015-02-26 | Disposition: A | Discharge status: Home / Self Care | Payer: Medicare Other | Source: Ambulatory Visit | Attending: Gastroenterology | Admitting: Gastroenterology

## 2015-02-26 DIAGNOSIS — K869 Disease of pancreas, unspecified: Secondary | ICD-10-CM

## 2015-02-26 MED ORDER — GADOBENATE DIMEGLUMINE 529 MG/ML IV SOLN
9.0000 mL | Freq: Once | INTRAVENOUS | Status: AC | PRN
  Administered 2015-02-26: 9 mL via INTRAVENOUS

## 2015-03-11 ENCOUNTER — Other Ambulatory Visit: Payer: Self-pay | Admitting: Gastroenterology

## 2015-03-16 NOTE — Addendum Note (Signed)
Addended by: Clyde Zarrella on: 03/16/2015 08:01 AM   Modules accepted: Orders  

## 2015-03-19 ENCOUNTER — Encounter (HOSPITAL_COMMUNITY): Payer: Self-pay | Admitting: *Deleted

## 2015-03-24 ENCOUNTER — Other Ambulatory Visit: Payer: Self-pay | Admitting: Gastroenterology

## 2015-03-25 ENCOUNTER — Encounter (HOSPITAL_COMMUNITY): Payer: Self-pay

## 2015-03-25 ENCOUNTER — Ambulatory Visit (HOSPITAL_COMMUNITY): Payer: Medicare Other | Admitting: Registered Nurse

## 2015-03-25 ENCOUNTER — Encounter (HOSPITAL_COMMUNITY)
Admission: RE | Disposition: A | Discharge status: Home / Self Care | Payer: Self-pay | Source: Ambulatory Visit | Attending: Gastroenterology

## 2015-03-25 ENCOUNTER — Ambulatory Visit (HOSPITAL_COMMUNITY)
Admission: RE | Admit: 2015-03-25 | Discharge: 2015-03-25 | Disposition: A | Discharge status: Home / Self Care | Payer: Medicare Other | Source: Ambulatory Visit | Attending: Gastroenterology | Admitting: Gastroenterology

## 2015-03-25 DIAGNOSIS — Z96641 Presence of right artificial hip joint: Secondary | ICD-10-CM

## 2015-03-25 DIAGNOSIS — R188 Other ascites: Secondary | ICD-10-CM | POA: Insufficient documentation

## 2015-03-25 DIAGNOSIS — E7800 Pure hypercholesterolemia, unspecified: Secondary | ICD-10-CM

## 2015-03-25 DIAGNOSIS — Z89429 Acquired absence of other toe(s), unspecified side: Secondary | ICD-10-CM

## 2015-03-25 DIAGNOSIS — K7030 Alcoholic cirrhosis of liver without ascites: Secondary | ICD-10-CM

## 2015-03-25 DIAGNOSIS — M1990 Unspecified osteoarthritis, unspecified site: Secondary | ICD-10-CM | POA: Insufficient documentation

## 2015-03-25 DIAGNOSIS — K219 Gastro-esophageal reflux disease without esophagitis: Secondary | ICD-10-CM | POA: Insufficient documentation

## 2015-03-25 DIAGNOSIS — Z7983 Long term (current) use of bisphosphonates: Secondary | ICD-10-CM | POA: Insufficient documentation

## 2015-03-25 DIAGNOSIS — I252 Old myocardial infarction: Secondary | ICD-10-CM | POA: Insufficient documentation

## 2015-03-25 DIAGNOSIS — I10 Essential (primary) hypertension: Secondary | ICD-10-CM | POA: Insufficient documentation

## 2015-03-25 DIAGNOSIS — M810 Age-related osteoporosis without current pathological fracture: Secondary | ICD-10-CM | POA: Insufficient documentation

## 2015-03-25 DIAGNOSIS — Z79899 Other long term (current) drug therapy: Secondary | ICD-10-CM | POA: Insufficient documentation

## 2015-03-25 DIAGNOSIS — R933 Abnormal findings on diagnostic imaging of other parts of digestive tract: Secondary | ICD-10-CM | POA: Insufficient documentation

## 2015-03-25 DIAGNOSIS — M109 Gout, unspecified: Secondary | ICD-10-CM | POA: Insufficient documentation

## 2015-03-25 DIAGNOSIS — I739 Peripheral vascular disease, unspecified: Secondary | ICD-10-CM

## 2015-03-25 DIAGNOSIS — K8020 Calculus of gallbladder without cholecystitis without obstruction: Secondary | ICD-10-CM | POA: Insufficient documentation

## 2015-03-25 DIAGNOSIS — K861 Other chronic pancreatitis: Secondary | ICD-10-CM

## 2015-03-25 DIAGNOSIS — K859 Acute pancreatitis without necrosis or infection, unspecified: Secondary | ICD-10-CM | POA: Diagnosis not present

## 2015-03-25 DIAGNOSIS — E876 Hypokalemia: Secondary | ICD-10-CM | POA: Diagnosis not present

## 2015-03-25 HISTORY — PX: EUS: SHX5427

## 2015-03-25 HISTORY — PX: FINE NEEDLE ASPIRATION: SHX5430

## 2015-03-25 SURGERY — UPPER ENDOSCOPIC ULTRASOUND (EUS) RADIAL
Anesthesia: Monitor Anesthesia Care

## 2015-03-25 MED ORDER — PROPOFOL 10 MG/ML IV BOLUS
INTRAVENOUS | Status: AC
  Filled 2015-03-25: qty 40

## 2015-03-25 MED ORDER — PROPOFOL 10 MG/ML IV BOLUS
INTRAVENOUS | Status: DC | PRN
  Administered 2015-03-25 (×2): 50 mg via INTRAVENOUS
  Administered 2015-03-25 (×3): 100 mg via INTRAVENOUS
  Administered 2015-03-25: 50 mg via INTRAVENOUS

## 2015-03-25 MED ORDER — SODIUM CHLORIDE 0.9 % IV SOLN
INTRAVENOUS | Status: DC
Start: 2015-03-25 — End: 2015-03-25

## 2015-03-25 MED ORDER — LACTATED RINGERS IV SOLN
INTRAVENOUS | Status: DC
  Administered 2015-03-25: 1000 mL via INTRAVENOUS

## 2015-03-25 MED ORDER — PROPOFOL 10 MG/ML IV BOLUS
INTRAVENOUS | Status: AC
  Filled 2015-03-25: qty 20

## 2015-03-25 NOTE — Addendum Note (Signed)
Addended by: Windsor Goeken on: 03/25/2015 08:41 AM   Modules accepted: Orders  

## 2015-03-25 NOTE — Anesthesia Preprocedure Evaluation (Signed)
Anesthesia Evaluation  Patient identified by MRN, date of birth, ID band Patient awake    Reviewed: Allergy & Precautions, NPO status , Patient's Chart, lab work & pertinent test results, reviewed documented beta blocker date and time   Airway Mallampati: II  TM Distance: >3 FB Neck ROM: Full    Dental no notable dental hx. (+) Dental Advisory Given, Partial Upper, Poor Dentition   Pulmonary shortness of breath,    Pulmonary exam normal breath sounds clear to auscultation       Cardiovascular hypertension, Pt. on medications and Pt. on home beta blockers + Past MI and + Peripheral Vascular Disease  Normal cardiovascular exam+ dysrhythmias  Rhythm:Regular Rate:Normal     Neuro/Psych    GI/Hepatic GERD  Medicated,(+) Cirrhosis   ascites  substance abuse  alcohol use,   Endo/Other    Renal/GU      Musculoskeletal  (+) Arthritis ,   Abdominal   Peds  Hematology   Anesthesia Other Findings   Reproductive/Obstetrics                             Anesthesia Physical  Anesthesia Plan  ASA: III  Anesthesia Plan: MAC   Post-op Pain Management:    Induction: Intravenous  Airway Management Planned: Nasal Cannula and Natural Airway  Additional Equipment:   Intra-op Plan:   Post-operative Plan:   Informed Consent: I have reviewed the patients History and Physical, chart, labs and discussed the procedure including the risks, benefits and alternatives for the proposed anesthesia with the patient or authorized representative who has indicated his/her understanding and acceptance.   Dental advisory given  Plan Discussed with: CRNA  Anesthesia Plan Comments:         Anesthesia Quick Evaluation

## 2015-03-25 NOTE — Discharge Instructions (Signed)

## 2015-03-25 NOTE — H&P (Signed)
Patient interval history reviewed.  Patient examined again.  There has been no change from documented H/P dated 03/10/15 (scanned into chart from our office) except as documented above.  Assessment:  1.  Abnormal MRI pancreas.  Plan:  1.  Endoscopic ultrasound with possible fine needle aspiration. 2.  Risks (bleeding, infection, bowel perforation that could require surgery, sedation-related changes in cardiopulmonary systems), benefits (identification and possible treatment of source of symptoms, exclusion of certain causes of symptoms), and alternatives (watchful waiting, radiographic imaging studies, empiric medical treatment) of upper endoscopy with ultrasound (EUS  +/- FNA) were explained to patient/family in detail and patient wishes to proceed.

## 2015-03-25 NOTE — Op Note (Signed)
[facility] [address] [place], [id number]   ENDOSCOPIC ULTRASOUND PROCEDURE REPORT  PATIENT: Kathryn [name], Kathryn [name]  MR#: [id number] BIRTHDATE: January 11, 1951  GENDER: female ENDOSCOPIST: Willis ModenaWilliam Jordin Dambrosio, MD REFERRED BY:  [name], M.D. PROCEDURE DATE:  03/25/2015 PROCEDURE:   Upper EUS ASA CLASS:      Class III INDICATIONS:   1.  abnormal MRI pancreas. MEDICATIONS: Monitored anesthesia care  DESCRIPTION OF PROCEDURE:   After the risks benefits and alternatives of the procedure were  explained, informed consent was obtained. The patient was then placed in the left, lateral, decubitus postion and IV sedation was administered. Throughout the procedure, the patients blood pressure, pulse and oxygen saturations were monitored continuously.  Under direct visualization, the radial forward-viewing endoscope was introduced through the mouth  and advanced to the second portion of the duodenum  .  Water was used as necessary to provide an acoustic interface. Estimated blood loss is zero unless otherwise noted in this procedure report. Upon completion of the imaging, water was removed and the patient was sent to the recovery room in satisfactory condition.    FINDINGS:      Extensive changes of chronic pancreatitis throughout the pancreas, changes include lobularity, hyperechoic strands, hyperechoic foci, visible pancreatic duct side branches, hyperechoic pancreatic duct wall, pancreatic ductal stones, pancreatic parenchymal stones.  There were a couple areas of focal but ill-defined hyperechoic areas, each about 10-15 mm in diameter, one in periampullary/uncinate area and another in neck of pancreas. Clinically I suspect these are large lobules from her chronic pancreatitis.  Gallstones noted in gallbladder.  I could not clearly discern pancreatic ductal dilatation, in light of her extensive pancreatic calcifications but, in the head of the pancreas, where there weren't as  many calcifications, her pancreatic duct and bile duct were normal caliber.  IMPRESSION:     As above.  Suspect changes on MRI are due to chronic pancreatitis; patient is asymptomatic, has not lost weight (is in fact gaining weight), and her Ca 19-9 was normal.  However, in face of such significant chronic pancreatitis, it could be difficult to discern pancreatic mass.  RECOMMENDATIONS:     1.  Watch for potential complications of procedure. 2.  Repeat labs, including Ca 19-9, in 3-4 months. 3.  Repeat MRI in 4-6 months. 4.  If any interval change in appearance, might consider empiric biopsies of these hypoechoic region(s). 5.  Follow-up with [facility] in 3-4 months.   _______________________________ Rosalie DoctoreSignedWillis Modena:  Harlee Eckroth, MD 03/25/2015 9:29 AM   CC:

## 2015-03-25 NOTE — Anesthesia Postprocedure Evaluation (Signed)
Anesthesia Post Note  Patient: Vergie Livingnn B Fuentes  Procedure(s) Performed: Procedure(s) (LRB): UPPER ENDOSCOPIC ULTRASOUND (EUS) RADIAL (N/A) FINE NEEDLE ASPIRATION (FNA) RADIAL (N/A)  Patient location during evaluation: Endoscopy Anesthesia Type: MAC Level of consciousness: awake and alert Pain management: pain level controlled Vital Signs Assessment: post-procedure vital signs reviewed and stable Respiratory status: spontaneous breathing, nonlabored ventilation, respiratory function stable and patient connected to nasal cannula oxygen Cardiovascular status: stable and blood pressure returned to baseline Anesthetic complications: no    Last Vitals:  Filed Vitals:   03/25/15 0940 03/25/15 0950  BP: 116/75 124/76  Pulse: 86 84  Temp:    Resp: 21 11    Last Pain: There were no vitals filed for this visit.               Phillips Groutarignan, Rane Dumm

## 2015-03-25 NOTE — Anesthesia Postprocedure Evaluation (Signed)
Anesthesia Post Note  Patient: Kathryn [name]  Procedure(s) Performed: Procedure(s) (LRB): UPPER ENDOSCOPIC ULTRASOUND (EUS) RADIAL (N/A) FINE NEEDLE ASPIRATION (FNA) RADIAL (N/A)  Patient location during evaluation: PACU Level of consciousness: awake, awake and alert and oriented Pain management: pain level controlled Vital Signs Assessment: post-procedure vital signs reviewed and stable Respiratory status: spontaneous breathing Cardiovascular status: blood pressure returned to baseline and stable Anesthetic complications: no    Last Vitals:  Filed Vitals:   03/25/15 0742  BP: 122/82  Pulse: 91  Temp: 37 C  Resp: 12    Last Pain: There were no vitals filed for this visit.               Willy Vorce Genworth FinancialFulk

## 2015-03-25 NOTE — Transfer of Care (Signed)
Immediate Anesthesia Transfer of Care Note  Patient: Kathryn [name]  Procedure(s) Performed: Procedure(s): UPPER ENDOSCOPIC ULTRASOUND (EUS) RADIAL (N/A) FINE NEEDLE ASPIRATION (FNA) RADIAL (N/A)  Patient Location: PACU  Anesthesia Type:MAC  Level of Consciousness: awake, alert  and oriented  Airway & Oxygen Therapy: Patient Spontanous Breathing and Patient connected to face mask oxygen  Post-op Assessment: Report given to RN and Post -op Vital signs reviewed and stable  Post vital signs: Reviewed and stable  Last Vitals:  Filed Vitals:   03/25/15 0742  BP: 122/82  Pulse: 91  Temp: 37 C  Resp: 12    Complications: No apparent anesthesia complications

## 2015-03-26 ENCOUNTER — Emergency Department (HOSPITAL_COMMUNITY): Payer: Medicare Other

## 2015-03-26 ENCOUNTER — Encounter (HOSPITAL_COMMUNITY): Payer: Self-pay | Admitting: Gastroenterology

## 2015-03-26 ENCOUNTER — Observation Stay (HOSPITAL_COMMUNITY): Payer: Medicare Other

## 2015-03-26 ENCOUNTER — Inpatient Hospital Stay (HOSPITAL_COMMUNITY)
Admission: EM | Admit: 2015-03-26 | Discharge: 2015-04-02 | DRG: 439 | Disposition: A | Discharge status: Home Health | Payer: Medicare Other | Attending: Internal Medicine | Admitting: Internal Medicine

## 2015-03-26 ENCOUNTER — Inpatient Hospital Stay (HOSPITAL_COMMUNITY): Payer: Medicare Other

## 2015-03-26 DIAGNOSIS — E7800 Pure hypercholesterolemia, unspecified: Secondary | ICD-10-CM | POA: Diagnosis present

## 2015-03-26 DIAGNOSIS — K219 Gastro-esophageal reflux disease without esophagitis: Secondary | ICD-10-CM | POA: Diagnosis present

## 2015-03-26 DIAGNOSIS — M109 Gout, unspecified: Secondary | ICD-10-CM | POA: Diagnosis present

## 2015-03-26 DIAGNOSIS — E876 Hypokalemia: Secondary | ICD-10-CM | POA: Diagnosis present

## 2015-03-26 DIAGNOSIS — Z7983 Long term (current) use of bisphosphonates: Secondary | ICD-10-CM

## 2015-03-26 DIAGNOSIS — D638 Anemia in other chronic diseases classified elsewhere: Secondary | ICD-10-CM | POA: Diagnosis present

## 2015-03-26 DIAGNOSIS — R109 Unspecified abdominal pain: Secondary | ICD-10-CM | POA: Insufficient documentation

## 2015-03-26 DIAGNOSIS — Z96641 Presence of right artificial hip joint: Secondary | ICD-10-CM | POA: Diagnosis present

## 2015-03-26 DIAGNOSIS — K861 Other chronic pancreatitis: Secondary | ICD-10-CM | POA: Diagnosis present

## 2015-03-26 DIAGNOSIS — K8020 Calculus of gallbladder without cholecystitis without obstruction: Secondary | ICD-10-CM

## 2015-03-26 DIAGNOSIS — E860 Dehydration: Secondary | ICD-10-CM | POA: Diagnosis present

## 2015-03-26 DIAGNOSIS — I739 Peripheral vascular disease, unspecified: Secondary | ICD-10-CM | POA: Diagnosis present

## 2015-03-26 DIAGNOSIS — E872 Acidosis: Secondary | ICD-10-CM | POA: Diagnosis present

## 2015-03-26 DIAGNOSIS — I472 Ventricular tachycardia, unspecified: Secondary | ICD-10-CM

## 2015-03-26 DIAGNOSIS — K7030 Alcoholic cirrhosis of liver without ascites: Secondary | ICD-10-CM | POA: Diagnosis present

## 2015-03-26 DIAGNOSIS — G8929 Other chronic pain: Secondary | ICD-10-CM | POA: Diagnosis present

## 2015-03-26 DIAGNOSIS — K210 Gastro-esophageal reflux disease with esophagitis: Secondary | ICD-10-CM | POA: Diagnosis not present

## 2015-03-26 DIAGNOSIS — K810 Acute cholecystitis: Secondary | ICD-10-CM

## 2015-03-26 DIAGNOSIS — Z89432 Acquired absence of left foot: Secondary | ICD-10-CM

## 2015-03-26 DIAGNOSIS — K709 Alcoholic liver disease, unspecified: Secondary | ICD-10-CM | POA: Diagnosis not present

## 2015-03-26 DIAGNOSIS — I10 Essential (primary) hypertension: Secondary | ICD-10-CM | POA: Diagnosis present

## 2015-03-26 DIAGNOSIS — K8000 Calculus of gallbladder with acute cholecystitis without obstruction: Secondary | ICD-10-CM | POA: Diagnosis present

## 2015-03-26 DIAGNOSIS — Z79899 Other long term (current) drug therapy: Secondary | ICD-10-CM

## 2015-03-26 DIAGNOSIS — M810 Age-related osteoporosis without current pathological fracture: Secondary | ICD-10-CM | POA: Diagnosis present

## 2015-03-26 DIAGNOSIS — K819 Cholecystitis, unspecified: Secondary | ICD-10-CM

## 2015-03-26 DIAGNOSIS — K859 Acute pancreatitis without necrosis or infection, unspecified: Secondary | ICD-10-CM | POA: Diagnosis present

## 2015-03-26 DIAGNOSIS — R1013 Epigastric pain: Secondary | ICD-10-CM

## 2015-03-26 DIAGNOSIS — R52 Pain, unspecified: Secondary | ICD-10-CM

## 2015-03-26 DIAGNOSIS — R748 Abnormal levels of other serum enzymes: Secondary | ICD-10-CM

## 2015-03-26 DIAGNOSIS — R002 Palpitations: Secondary | ICD-10-CM | POA: Diagnosis present

## 2015-03-26 DIAGNOSIS — I252 Old myocardial infarction: Secondary | ICD-10-CM

## 2015-03-26 LAB — COMPREHENSIVE METABOLIC PANEL
ALK PHOS: 178 U/L — AB (ref 38–126)
ALT: 30 U/L (ref 14–54)
ANION GAP: 11 (ref 5–15)
AST: 134 U/L — ABNORMAL HIGH (ref 15–41)
Albumin: 2.9 g/dL — ABNORMAL LOW (ref 3.5–5.0)
BILIRUBIN TOTAL: 1.3 mg/dL — AB (ref 0.3–1.2)
BUN: 7 mg/dL (ref 6–20)
CALCIUM: 9 mg/dL (ref 8.9–10.3)
CO2: 27 mmol/L (ref 22–32)
CREATININE: 0.77 mg/dL (ref 0.44–1.00)
Chloride: 105 mmol/L (ref 101–111)
Glucose, Bld: 135 mg/dL — ABNORMAL HIGH (ref 65–99)
Potassium: 2.8 mmol/L — ABNORMAL LOW (ref 3.5–5.1)
Sodium: 143 mmol/L (ref 135–145)
TOTAL PROTEIN: 6.5 g/dL (ref 6.5–8.1)

## 2015-03-26 LAB — BASIC METABOLIC PANEL
ANION GAP: 10 (ref 5–15)
BUN: 8 mg/dL (ref 6–20)
CO2: 29 mmol/L (ref 22–32)
Calcium: 8.9 mg/dL (ref 8.9–10.3)
Chloride: 103 mmol/L (ref 101–111)
Creatinine, Ser: 0.81 mg/dL (ref 0.44–1.00)
GFR calc Af Amer: >60 mL/min (ref >60)
GLUCOSE: 116 mg/dL — AB (ref 65–99)
POTASSIUM: 3.5 mmol/L (ref 3.5–5.1)
Sodium: 142 mmol/L (ref 135–145)

## 2015-03-26 LAB — URINE MICROSCOPIC-ADD ON

## 2015-03-26 LAB — LACTIC ACID, PLASMA
LACTIC ACID, VENOUS: 3.5 mmol/L — AB (ref 0.5–2.0)
LACTIC ACID, VENOUS: 2.4 mmol/L — AB (ref 0.5–2.0)
Lactic Acid, Venous: 3.8 mmol/L (ref 0.5–2.0)

## 2015-03-26 LAB — CBC
HCT: 36 % (ref 36.0–46.0)
HEMOGLOBIN: 11.8 g/dL — AB (ref 12.0–15.0)
MCH: 31.3 pg (ref 26.0–34.0)
MCHC: 32.8 g/dL (ref 30.0–36.0)
MCV: 95.5 fL (ref 78.0–100.0)
PLATELETS: 158 10*3/uL (ref 150–400)
RBC: 3.77 MIL/uL — AB (ref 3.87–5.11)
RDW: 13.7 % (ref 11.5–15.5)
WBC: 5.3 10*3/uL (ref 4.0–10.5)

## 2015-03-26 LAB — LIPASE, BLOOD: Lipase: 42 U/L (ref 11–51)

## 2015-03-26 LAB — URINALYSIS, ROUTINE W REFLEX MICROSCOPIC
Glucose, UA: NEGATIVE mg/dL
HGB URINE DIPSTICK: NEGATIVE
Ketones, ur: 15 mg/dL — AB
NITRITE: NEGATIVE
PROTEIN: NEGATIVE mg/dL
SPECIFIC GRAVITY, URINE: 1.021 (ref 1.005–1.030)
pH: 6 (ref 5.0–8.0)

## 2015-03-26 LAB — TROPONIN I

## 2015-03-26 LAB — MAGNESIUM
Magnesium: 0.8 mg/dL — CL (ref 1.7–2.4)
Magnesium: 1.9 mg/dL (ref 1.7–2.4)

## 2015-03-26 LAB — PROCALCITONIN: Procalcitonin: <0.1 ng/mL

## 2015-03-26 LAB — AMYLASE: Amylase: 95 U/L (ref 28–100)

## 2015-03-26 MED ORDER — ALLOPURINOL 100 MG PO TABS
100.0000 mg | ORAL_TABLET | Freq: Two times a day (BID) | ORAL | Status: DC
  Administered 2015-03-26 – 2015-04-02 (×13): 100 mg via ORAL
  Filled 2015-03-26 (×14): qty 1

## 2015-03-26 MED ORDER — ACETAMINOPHEN 650 MG RE SUPP: 650.0000 mg | Freq: Four times a day (QID) | RECTAL | Status: DC | PRN

## 2015-03-26 MED ORDER — POTASSIUM CHLORIDE 10 MEQ/100ML IV SOLN
10.0000 meq | INTRAVENOUS | Status: AC
  Administered 2015-03-26 (×3): 10 meq via INTRAVENOUS
  Filled 2015-03-26 (×3): qty 100

## 2015-03-26 MED ORDER — SODIUM CHLORIDE 0.9 % IV SOLN
INTRAVENOUS | Status: DC
  Administered 2015-03-26: 15:00:00 via INTRAVENOUS

## 2015-03-26 MED ORDER — SODIUM CHLORIDE 0.9 % IV BOLUS (SEPSIS)
500.0000 mL | Freq: Once | INTRAVENOUS | Status: AC
  Administered 2015-03-26: 500 mL via INTRAVENOUS

## 2015-03-26 MED ORDER — ONDANSETRON HCL 4 MG/2ML IJ SOLN
4.0000 mg | Freq: Once | INTRAMUSCULAR | Status: AC | PRN
  Administered 2015-03-26: 4 mg via INTRAVENOUS
  Filled 2015-03-26: qty 2

## 2015-03-26 MED ORDER — POLYETHYLENE GLYCOL 3350 17 G PO PACK: 17.0000 g | PACK | Freq: Every day | ORAL | Status: DC | PRN

## 2015-03-26 MED ORDER — PROPRANOLOL HCL 10 MG PO TABS
10.0000 mg | ORAL_TABLET | Freq: Two times a day (BID) | ORAL | Status: DC
  Administered 2015-03-26 – 2015-03-29 (×5): 10 mg via ORAL
  Filled 2015-03-26 (×9): qty 1

## 2015-03-26 MED ORDER — FOLIC ACID 1 MG PO TABS
1.0000 mg | ORAL_TABLET | Freq: Every day | ORAL | Status: DC
  Administered 2015-03-26: 1 mg via ORAL
  Filled 2015-03-26: qty 1

## 2015-03-26 MED ORDER — SODIUM CHLORIDE 0.9 % IV BOLUS (SEPSIS)
1000.0000 mL | Freq: Once | INTRAVENOUS | Status: AC
  Administered 2015-03-26: 1000 mL via INTRAVENOUS

## 2015-03-26 MED ORDER — DEXTROSE-NACL 5-0.9 % IV SOLN
INTRAVENOUS | Status: DC
  Administered 2015-03-26 – 2015-03-27 (×2): via INTRAVENOUS

## 2015-03-26 MED ORDER — MAGNESIUM SULFATE 2 GM/50ML IV SOLN
2.0000 g | Freq: Once | INTRAVENOUS | Status: AC
  Administered 2015-03-26: 2 g via INTRAVENOUS
  Filled 2015-03-26: qty 50

## 2015-03-26 MED ORDER — MIRTAZAPINE 15 MG PO TABS
15.0000 mg | ORAL_TABLET | Freq: Every day | ORAL | Status: DC
  Administered 2015-03-26: 15 mg via ORAL
  Filled 2015-03-26: qty 1

## 2015-03-26 MED ORDER — IOHEXOL 300 MG/ML  SOLN
100.0000 mL | Freq: Once | INTRAMUSCULAR | Status: AC | PRN
  Administered 2015-03-26: 100 mL via INTRAVENOUS

## 2015-03-26 MED ORDER — PANTOPRAZOLE SODIUM 40 MG PO TBEC
40.0000 mg | DELAYED_RELEASE_TABLET | Freq: Every day | ORAL | Status: DC
  Administered 2015-03-26: 40 mg via ORAL
  Filled 2015-03-26: qty 1

## 2015-03-26 MED ORDER — SODIUM CHLORIDE 0.9 % IJ SOLN
3.0000 mL | Freq: Two times a day (BID) | INTRAMUSCULAR | Status: DC
  Administered 2015-03-26 – 2015-04-01 (×6): 3 mL via INTRAVENOUS

## 2015-03-26 MED ORDER — HEPARIN SODIUM (PORCINE) 5000 UNIT/ML IJ SOLN
5000.0000 [IU] | Freq: Three times a day (TID) | INTRAMUSCULAR | Status: DC
Start: 2015-03-26 — End: 2015-04-02
  Administered 2015-03-26 – 2015-04-02 (×19): 5000 [IU] via SUBCUTANEOUS
  Filled 2015-03-26 (×19): qty 1

## 2015-03-26 MED ORDER — POTASSIUM CHLORIDE CRYS ER 20 MEQ PO TBCR
40.0000 meq | EXTENDED_RELEASE_TABLET | Freq: Once | ORAL | Status: AC
  Administered 2015-03-26: 40 meq via ORAL
  Filled 2015-03-26: qty 2

## 2015-03-26 MED ORDER — ACETAMINOPHEN 325 MG PO TABS: 650.0000 mg | ORAL_TABLET | Freq: Four times a day (QID) | ORAL | Status: DC | PRN

## 2015-03-26 MED ORDER — SODIUM CHLORIDE 0.9 % IV BOLUS (SEPSIS)
500.0000 mL | Freq: Once | INTRAVENOUS | Status: AC
Start: 2015-03-26 — End: 2015-03-26
  Administered 2015-03-26: 500 mL via INTRAVENOUS

## 2015-03-26 MED ORDER — GABAPENTIN 100 MG PO CAPS: 200.0000 mg | ORAL_CAPSULE | Freq: Three times a day (TID) | ORAL | Status: DC | PRN

## 2015-03-26 MED ORDER — COLCHICINE 0.6 MG PO TABS: 0.6000 mg | ORAL_TABLET | Freq: Two times a day (BID) | ORAL | Status: DC | PRN

## 2015-03-26 MED ORDER — HYDROMORPHONE HCL 1 MG/ML IJ SOLN
1.0000 mg | INTRAMUSCULAR | Status: DC | PRN
  Administered 2015-03-26 – 2015-04-01 (×31): 1 mg via INTRAVENOUS
  Filled 2015-03-26 (×32): qty 1

## 2015-03-26 MED ORDER — VITAMIN B-1 100 MG PO TABS
100.0000 mg | ORAL_TABLET | Freq: Every day | ORAL | Status: DC
Start: 2015-03-26 — End: 2015-03-27
  Administered 2015-03-26: 100 mg via ORAL
  Filled 2015-03-26: qty 1

## 2015-03-26 MED ORDER — HYDROMORPHONE HCL 1 MG/ML IJ SOLN
1.0000 mg | Freq: Once | INTRAMUSCULAR | Status: AC
  Administered 2015-03-26: 1 mg via INTRAVENOUS
  Filled 2015-03-26: qty 1

## 2015-03-26 MED ORDER — MORPHINE SULFATE (PF) 4 MG/ML IV SOLN
4.0000 mg | Freq: Once | INTRAVENOUS | Status: AC
  Administered 2015-03-26: 4 mg via INTRAVENOUS
  Filled 2015-03-26: qty 1

## 2015-03-26 NOTE — H&P (Signed)
[facility] History and Physical                                                                                    Kathryn [name], is a 64 y.o. female  MRN: [id number]   DOB - 02-09-1951  Admit Date - 03/26/2015  Outpatient Primary MD for the patient is [name], MD  Referring Physician:  Sharilyn SitesLisa Sanders, PA-C  Chief Complaint:   Chief Complaint  Patient presents with  . Pancreatitis     HPI  Kathryn [name]  is a 64 y.o. female, who is a reformed alcoholic and a "straight-shooter" per Dr. Dulce Sellarutlaw.  She has alcoholic cirrhosis and chronic pancreatitis, PVD, and HTN.  She presents to the ER after a sudden onset of abdominal pain and vomiting this morning.  The patient was feeling well yesterday.  She had an EUS (without biopsy) yesterday by Dr. Dulce Sellarutlaw.  Her bile duct was not cannulated during the procedure.  She felt well immediately after the procedure.  This morning she awoke at approximately 5:00 am with vomiting and pain.  The vomiting continued until approximately 8:30 am when she came to the ER.  In the ER she was given dilaudid and felt relief from her abdominal pain, but she developed slight heart palpitations and was noted to have a 7 beat run of V. tach telemetry.  In the emergency department her potassium was 2.8 and mag was 0.8.  Total bili was 1/2 and AST 134.  Amylase 95, Lipase 42.  Xrays of her chest and abdomen were negative.  EKG shows sinus rhythm with a prolonged QTc.  We will admit her for an episode of acute on chronic pancreatitis and electrolyte abnormalities.  Review of Systems  Constitutional: Negative.   HENT: Negative.   Eyes: Negative.   Respiratory: Negative.   Cardiovascular: Positive for palpitations. Negative for chest pain.  Gastrointestinal: Positive for nausea, vomiting and abdominal pain.  Genitourinary: Negative.   Musculoskeletal: Positive for joint pain.  Skin: Negative.   Neurological: Negative.   Endo/Heme/Allergies: Negative.    Psychiatric/Behavioral: Negative.   Patient complains of frequent falls   Past Medical History  Past Medical History  Diagnosis Date  . Osteoporosis   . Alcoholic liver disease (HCC)   . High cholesterol   . Ascites 09/22/11    "first time for , now resolved  . Pancreatitis several years ago  . History of blood transfusion 07/2011    "when I had elbow surgery"  . GERD (gastroesophageal reflux disease)   . Staph infection     left foot  . Fracture, humerus 07/2011    right  . Dysrhythmia     "irregular"  . Arthritis     "all over"  . Peripheral vascular disease (HCC)   . Hypertension     not current  . Osteomyelitis (HCC)     left foot  . Myocardial infarction [facility](HCC)     NSTEMI 12/2011 in setting of septic shock    Past Surgical History  Procedure Laterality Date  . Amputation      two toes on left foot  . Bunionectomy      both  feet  . Arthroscopic repair acl      right knee  . Orif humerus fracture  07/26/2011    Procedure: OPEN REDUCTION INTERNAL FIXATION (ORIF) DISTAL HUMERUS FRACTURE;  Surgeon: Budd Palmer, MD;  Location: [facility] OR;  Service: Orthopedics;  Laterality: Right;  . Total hip arthroplasty  ~ 2003    right  . Tubal ligation  1980  . Paracentesis  09/22/11    2.2L  . Esophagogastroduodenoscopy  09/23/2011    Procedure: ESOPHAGOGASTRODUODENOSCOPY (EGD);  Surgeon: Shirley Friar, MD;  Location: [facility] ENDOSCOPY;  Service: Endoscopy;  Laterality: N/A;  . Transmetatarsal amputation  02/16/2012    Procedure: TRANSMETATARSAL AMPUTATION;  Surgeon: Nadara Mustard, MD;  Location: [facility] OR;  Service: Orthopedics;  Laterality: Left;  . Fracture surgery  2013    Right elbow  . Joint replacement      right hip   . Esophagogastroduodenoscopy (egd) with propofol N/A 08/22/2012    Procedure: ESOPHAGOGASTRODUODENOSCOPY (EGD) WITH PROPOFOL;  Surgeon: Willis Modena, MD;  Location: [facility] ENDOSCOPY;  Service: Endoscopy;  Laterality: N/A;  . Leg surgery Left     Correction of  unequal leg length  . Orif wrist fracture Left 12/26/2014    Procedure: OPEN REDUCTION INTERNAL FIXATION (ORIF) WRIST FRACTURE;  Surgeon: Cammy Copa, MD;  Location: [facility] OR;  Service: Orthopedics;  Laterality: Left;  . Eus N/A 03/25/2015    Procedure: UPPER ENDOSCOPIC ULTRASOUND (EUS) RADIAL;  Surgeon: Willis Modena, MD;  Location: [facility] ENDOSCOPY;  Service: Endoscopy;  Laterality: N/A;  . Fine needle aspiration N/A 03/25/2015    Procedure: FINE NEEDLE ASPIRATION (FNA) RADIAL;  Surgeon: Willis Modena, MD;  Location: [facility] ENDOSCOPY;  Service: Endoscopy;  Laterality: N/A;      Social History Social History  Substance Use Topics  . Smoking status: Never Smoker   . Smokeless tobacco: Never Used  . Alcohol Use: No     Comment: "last drink was day before OR in 07/2011"  Lives with her boyfriend.  No longer drinks.  Denies Tob / Rec. Drugs.  Family History No hx of pancreatitis or biliary disease in the family.  Her father had prostate CA that metastasized to the bone.  Prior to Admission medications   Medication Sig Start Date End Date Taking? Authorizing Provider  alendronate (FOSAMAX) 70 MG tablet Take 70 mg by mouth once a week. Sundays 03/04/15  Yes Historical Provider, MD  allopurinol (ZYLOPRIM) 100 MG tablet Take 100 mg by mouth 2 (two) times daily.   Yes Historical Provider, MD  colchicine 0.6 MG tablet Take 0.6 mg by mouth 2 (two) times daily as needed (gout).   Yes Historical Provider, MD  [employer] CALCIUM CITRATE +D3 MINI 200-250 MG-UNIT TABS Take 1 tablet by mouth 2 (two) times daily with a meal. 02/08/15  Yes Historical Provider, MD  folic acid (FOLVITE) 1 MG tablet Take 1 tablet (1 mg total) by mouth daily. 04/20/12  Yes Brent Bulla, MD  furosemide (LASIX) 40 MG tablet Take 1 tablet (40 mg total) by mouth daily. 12/26/14  Yes Scott Rise Paganini, MD  gabapentin (NEURONTIN) 100 MG capsule Take 200 mg by mouth 3 (three) times daily as needed. Nerve pain 02/10/15  Yes Historical Provider, MD   HYDROmorphone (DILAUDID) 2 MG tablet Take 2 mg by mouth every 4 (four) hours as needed for severe pain.   Yes Historical Provider, MD  mirtazapine (REMERON) 15 MG tablet Take 1 tablet (15 mg total) by mouth at bedtime. 03/26/12  Yes  Brent Bulla, MD  omeprazole (PRILOSEC) 40 MG capsule Take 40 mg by mouth daily.   Yes Historical Provider, MD  propranolol (INDERAL) 10 MG tablet Take 1 tablet (10 mg total) by mouth 2 (two) times daily. 03/26/12  Yes Brent Bulla, MD  spironolactone (ALDACTONE) 50 MG tablet Take 50 mg by mouth daily.   Yes Historical Provider, MD  thiamine (VITAMIN B-1) 100 MG tablet Take 1 tablet (100 mg total) by mouth daily. 04/20/12  Yes Brent Bulla, MD    Allergies  Allergen Reactions  . Codeine Hives    Causes blisters  . Tramadol Other (See Comments)    Loss of consciousness     Physical Exam  Vitals  Blood pressure 117/78, pulse 82, temperature 98.3 F (36.8 C), temperature source Oral, resp. rate 16, SpO2 99 %.   General: Very pleasant female lying in bed in NAD,   Psych:  Normal affect and insight, Not Suicidal or Homicidal, Awake Alert, Oriented X 3.  Neuro:   No F.N deficits, ALL C.Nerves Intact, Strength 5/5 all 4 extremities, Sensation intact all 4 extremities.  ENT:  Ears and Eyes appear Normal, Conjunctivae clear, PER. Moist oral mucosa without erythema or exudates.  Neck:  Supple, No lymphadenopathy appreciated  Respiratory:  Symmetrical chest wall movement, Good air movement bilaterally, CTAB.  Cardiac:  RRR, No Murmurs, no LE edema noted, no JVD.    Abdomen:  Abdomen appears bloated, tender to palpation particularly in the upper quadrants bilaterally, no masses felt, positive bowel sounds.  Skin:  No Cyanosis, Normal Skin Turgor, No Skin Rash or Bruise.  Extremities:  Able to move all 4. 5/5 strength in each,  no effusions. She is a trans-metatarsal amputation in her left lower extremity  Data Review  Wt Readings from Last 3 Encounters:   12/26/14 49.442 kg (109 lb)  12/25/14 49.442 kg (109 lb)  02/06/13 50.032 kg (110 lb 4.8 oz)    CBC  Recent Labs Lab 03/26/15 1109  WBC 5.3  HGB 11.8*  HCT 36.0  PLT 158  MCV 95.5  MCH 31.3  MCHC 32.8  RDW 13.7    Chemistries   Recent Labs Lab 03/26/15 1109  NA 143  K 2.8*  CL 105  CO2 27  GLUCOSE 135*  BUN 7  CREATININE 0.77  CALCIUM 9.0  MG 0.8*  AST 134*  ALT 30  ALKPHOS 178*  BILITOT 1.3*      Lab Results  Component Value Date   HGBA1C 4.9 07/26/2011     Cardiac Enzymes  Recent Labs Lab 03/26/15 1109  TROPONINI <0.03     Urinalysis    Component Value Date/Time   COLORURINE AMBER* 03/26/2015 1101   APPEARANCEUR CLOUDY* 03/26/2015 1101   LABSPEC 1.021 03/26/2015 1101   PHURINE 6.0 03/26/2015 1101   GLUCOSEU NEGATIVE 03/26/2015 1101   GLUCOSEU NEG mg/dL 16/01/9603 5409   HGBUR NEGATIVE 03/26/2015 1101   HGBUR negative 03/17/2008 1240   BILIRUBINUR MODERATE* 03/26/2015 1101   KETONESUR 15* 03/26/2015 1101   PROTEINUR NEGATIVE 03/26/2015 1101   UROBILINOGEN 0.2 12/30/2013 1251   NITRITE NEGATIVE 03/26/2015 1101   LEUKOCYTESUR SMALL* 03/26/2015 1101    Imaging results:   Mr Abdomen W Wo Contrast  02/26/2015  CLINICAL DATA:  Evaluate cystic lesion within the pancreas. EXAM: MRI ABDOMEN WITHOUT AND WITH CONTRAST TECHNIQUE: Multiplanar multisequence MR imaging of the abdomen was performed both before and after the administration of intravenous contrast. CONTRAST:  9mL MULTIHANCE GADOBENATE DIMEGLUMINE  529 MG/ML IV SOLN COMPARISON:  08/22/2014 FINDINGS: Lower chest: No significant pleural fluid identified. No pericardial fluid identified. Hepatobiliary: Stones identified within the gallbladder fundus. No gallbladder wall thickening. No biliary dilatation. The liver appears cirrhotic. There is atrophy and fibrosis of the right hepatic lobe. There is diffuse hepatic steatosis identified. Pancreas: There is increase caliber of the pancreatic  duct within the body and tail of pancreas. This measures up to 3 mm and there is an abrupt cut off of the prominent pancreatic duct at the junction between the neck and body of the pancreas. New from previous exam is mild soft tissue stranding surrounding the head and tail of pancreas which appears to extend around the splenic artery as it originates from the cystic duct. The small cystic area within tail of pancreas is again identified measuring 1 cm, image 17 of series 11. Spleen: Negative Adrenals/Urinary Tract: Normal appearance of the adrenal glands. Unremarkable appearance of the kidneys. Stomach/Bowel: The stomach is normal. The visualized small and large bowel loops are unremarkable. Vascular/Lymphatic: Normal appearance of the abdominal aorta. Insert nodes Other: No free fluid or fluid collections within the upper abdomen. Musculoskeletal: Normal signal identified within the bone marrow. There is a scoliosis deformity involving the lumbar spine which is convex towards the left. IMPRESSION: 1. Spectrum of findings on today's exam are suspicious for underlying pancreatic lesion. There is focal dilatation of the pancreatic duct within the body and tail tail with abrupt cut off at the head/neck junction. Mild soft tissue stranding surrounding the pancreas noted within this area. Further investigation with endoscopic ultrasound and possible biopsy is recommended to rule out the possibility of underlying pancreatic lesion including adenocarcinoma. 2. Cirrhosis. Electronically Signed   By: Signa Kell M.D.   On: 02/26/2015 13:52   Dg Chest Port 1 View  03/26/2015  CLINICAL DATA:  Myocardial infarction.  Ventricular tachycardia EXAM: PORTABLE CHEST 1 VIEW COMPARISON:  09/29/2014 FINDINGS: Normal heart size and stable aortic contours. There is mild atelectatic type opacity at the medial right base. No edema, effusion, or consolidation. Remote left sixth rib fracture. No acute osseous finding. IMPRESSION: Mild  right basilar atelectasis. Electronically Signed   By: Marnee Spring M.D.   On: 03/26/2015 11:13   Dg Abd 2 Views  03/26/2015  CLINICAL DATA:  Nausea and vomiting EXAM: ABDOMEN - 2 VIEW COMPARISON:  None. FINDINGS: Scattered large and small bowel gas is noted. Degenerative changes of the lumbar spine are seen with a scoliosis concave to the right. Postsurgical changes in the right hip are noted. Prior fractures in the left pubic rami are seen. No free air is noted. No acute abnormality noted. IMPRESSION: No acute abnormality seen. Electronically Signed   By: Alcide Clever M.D.   On: 03/26/2015 14:04    My personal review of EKG: sinus rhythm   Assessment & Plan  Principal Problem:   Acute on chronic pancreatitis [facility]) Active Problems:   Essential hypertension   GERD   Osteoporosis   Alcoholic liver disease (HCC)   Hypokalemia   Palpitation   Hypomagnesemia   Acute exacerbation of chronic pancreatitis Discussed with Dr. Dulce Sellar. He is available if needed. Abdominal x-rays are negative for any type of perforation area. Will start patient on nonfat clears. Slowly advance as tolerated.  Palpitations Patient had a short run of V. tach (7 beats) likely due to electrolyte abnormalities. Will replete electrolytes and monitor on telemetry. Follow-up bmet at 4 PM this afternoon  Hypomagnesemia with hypokalemia Repleted  IV. Follow-up bmet at 4 PM this afternoon.  Alcoholic liver disease with possible pancreatic duct obstruction/lesion MRI performed in November 2016 showed possible pancreatic lesion. EUS performed yesterday by Dr. Dulce Sellar. Continue outpatient management.   Consultants Called:    None  Family Communication:     Patient is alert, orientated and understands their plan of care.  Code Status:    full  Condition:    Guarded.  Potential Disposition:   To home when she is able to tolerate a diet.  Time spent in minutes : [address]   [facility] [place],   [place] on 03/26/2015 at 2:35 PM Between 7am to 7pm - Pager - [phone] After 7pm go to [email or web address] - password [email or web address] And look for the night coverage person covering me after hours

## 2015-03-26 NOTE — ED Notes (Signed)
Per PA patient to remain NPO

## 2015-03-26 NOTE — ED Provider Notes (Signed)
CSN: [id number]     Arrival date & time 03/26/15  0909 History   First MD Initiated Contact with Patient 03/26/15 0914     Chief Complaint  Patient presents with  . Pancreatitis     (Consider location/radiation/quality/duration/timing/severity/associated sxs/prior Treatment) The history is provided by the patient and medical records.   64 year old female with history of osteoporosis, hyperlipidemia, chronic pancreatitis, GERD, arthritis, hypertension, presenting to the ED for abdominal pain. Patient has history of chronic pancreatitis, currently followed by GI-- Dr. Dulce Sellarutlaw.  Patient states she had a recent MRI which showed some slight abnormalities concerning for malignancy so she had an endoscopic ultrasound of her pancreas and FNA biopsy performed yesterday which showed some inflammation and he warned her that she may begin to experience symptoms. She states around 0500 this morning she began to have epigastric pain, nausea, and vomiting. She denies any fever. She is attempted to eat and drink, however she vomits almost immediately afterwards. She states her last flare was approximately 3 years ago, however her symptoms today are very similar. She denies any chest pain or shortness of breath. Patient's only cardiac history is history of an STEMI in the setting of severe sepsis. She does not have any known ongoing cardiac issues. Patient has never been a smoker.  Patient states she was previously on 2mg  dilaudid at home, however she is out of her pain medications.   Past Medical History  Diagnosis Date  . Osteoporosis   . Alcoholic liver disease (HCC)   . High cholesterol   . Ascites 09/22/11    "first time for , now resolved  . Pancreatitis several years ago  . History of blood transfusion 07/2011    "when I had elbow surgery"  . GERD (gastroesophageal reflux disease)   . Staph infection     left foot  . Fracture, humerus 07/2011    right  . Dysrhythmia     "irregular"  . Arthritis    "all over"  . Peripheral vascular disease (HCC)   . Hypertension     not current  . Osteomyelitis (HCC)     left foot  . Myocardial infarction [facility](HCC)     NSTEMI 12/2011 in setting of septic shock   Past Surgical History  Procedure Laterality Date  . Amputation      two toes on left foot  . Bunionectomy      both feet  . Arthroscopic repair acl      right knee  . Orif humerus fracture  07/26/2011    Procedure: OPEN REDUCTION INTERNAL FIXATION (ORIF) DISTAL HUMERUS FRACTURE;  Surgeon: Budd PalmerMichael H Handy, MD;  Location: [facility] OR;  Service: Orthopedics;  Laterality: Right;  . Total hip arthroplasty  ~ 2003    right  . Tubal ligation  1980  . Paracentesis  09/22/11    2.2L  . Esophagogastroduodenoscopy  09/23/2011    Procedure: ESOPHAGOGASTRODUODENOSCOPY (EGD);  Surgeon: Shirley FriarVincent C. Schooler, MD;  [facility];  Service: Endoscopy;  Laterality: N/A;  . Transmetatarsal amputation  02/16/2012    Procedure: TRANSMETATARSAL AMPUTATION;  Surgeon: Nadara MustardMarcus V Duda, MD;  Location: [facility] OR;  Service: Orthopedics;  Laterality: Left;  . Fracture surgery  2013    Right elbow  . Joint replacement      right hip   . Esophagogastroduodenoscopy (egd) with propofol N/A 08/22/2012    Procedure: ESOPHAGOGASTRODUODENOSCOPY (EGD) WITH PROPOFOL;  Surgeon: Willis ModenaWilliam Outlaw, MD;  Location: [facility] ENDOSCOPY;  Service: Endoscopy;  Laterality: N/A;  . Leg  surgery Left     Correction of unequal leg length  . Orif wrist fracture Left 12/26/2014    Procedure: OPEN REDUCTION INTERNAL FIXATION (ORIF) WRIST FRACTURE;  Surgeon: Cammy Copa, MD;  Location: [facility] OR;  Service: Orthopedics;  Laterality: Left;  . Eus N/A 03/25/2015    Procedure: UPPER ENDOSCOPIC ULTRASOUND (EUS) RADIAL;  Surgeon: Willis Modena, MD;  Location: [facility] ENDOSCOPY;  Service: Endoscopy;  Laterality: N/A;  . Fine needle aspiration N/A 03/25/2015    Procedure: FINE NEEDLE ASPIRATION (FNA) RADIAL;  Surgeon: Willis Modena, MD;  Location: [facility] ENDOSCOPY;  Service:  Endoscopy;  Laterality: N/A;   No family history on file. Social History  Substance Use Topics  . Smoking status: Never Smoker   . Smokeless tobacco: Never Used  . Alcohol Use: No     Comment: "last drink was day before OR in 07/2011"   OB History    No data available     Review of Systems  Gastrointestinal: Positive for nausea, vomiting and abdominal pain.  All other systems reviewed and are negative.     Allergies  Codeine and Tramadol  Home Medications   Prior to Admission medications   Medication Sig Start Date End Date Taking? Authorizing Provider  alendronate (FOSAMAX) 70 MG tablet Take 70 mg by mouth once a week. Sundays 03/04/15  Yes Historical Provider, MD  allopurinol (ZYLOPRIM) 100 MG tablet Take 100 mg by mouth 2 (two) times daily.   Yes Historical Provider, MD  colchicine 0.6 MG tablet Take 0.6 mg by mouth 2 (two) times daily as needed (gout).   Yes Historical Provider, MD  [employer] CALCIUM CITRATE +D3 MINI 200-250 MG-UNIT TABS Take 1 tablet by mouth 2 (two) times daily with a meal. 02/08/15  Yes Historical Provider, MD  folic acid (FOLVITE) 1 MG tablet Take 1 tablet (1 mg total) by mouth daily. 04/20/12  Yes Brent Bulla, MD  furosemide (LASIX) 40 MG tablet Take 1 tablet (40 mg total) by mouth daily. 12/26/14  Yes Scott Rise Paganini, MD  gabapentin (NEURONTIN) 100 MG capsule Take 200 mg by mouth 3 (three) times daily as needed. Nerve pain 02/10/15  Yes Historical Provider, MD  HYDROmorphone (DILAUDID) 2 MG tablet Take 2 mg by mouth every 4 (four) hours as needed for severe pain.   Yes Historical Provider, MD  mirtazapine (REMERON) 15 MG tablet Take 1 tablet (15 mg total) by mouth at bedtime. 03/26/12  Yes Brent Bulla, MD  omeprazole (PRILOSEC) 40 MG capsule Take 40 mg by mouth daily.   Yes Historical Provider, MD  propranolol (INDERAL) 10 MG tablet Take 1 tablet (10 mg total) by mouth 2 (two) times daily. 03/26/12  Yes Brent Bulla, MD  spironolactone (ALDACTONE) 50 MG  tablet Take 50 mg by mouth daily.   Yes Historical Provider, MD  thiamine (VITAMIN B-1) 100 MG tablet Take 1 tablet (100 mg total) by mouth daily. 04/20/12  Yes Brent Bulla, MD   BP 140/95 mmHg  Pulse 91  Temp(Src) 97.6 F (36.4 C) (Oral)  Resp 14  SpO2 100%   Physical Exam  Constitutional: She is oriented to person, place, and time. She appears well-developed and well-nourished. No distress.  Appears uncomfortable  HENT:  Head: Normocephalic and atraumatic.  Mouth/Throat: Oropharynx is clear and moist.  Eyes: Conjunctivae and EOM are normal. Pupils are equal, round, and reactive to light.  Neck: Normal range of motion. Neck supple.  Cardiovascular: Normal rate, regular rhythm and normal heart  sounds.   Pulmonary/Chest: Effort normal and breath sounds normal. No respiratory distress. She has no wheezes.  Abdominal: Soft. Bowel sounds are normal. There is tenderness in the epigastric area. There is no rigidity and no guarding.  Epigastrium is tender to palpation without rigidity or guarding; normal bowel sounds  Musculoskeletal: Normal range of motion. She exhibits no edema.  Neurological: She is alert and oriented to person, place, and time.  Skin: Skin is warm and dry. She is not diaphoretic.  Psychiatric: She has a normal mood and affect.  Nursing note and vitals reviewed.   ED Course  Procedures (including critical care time) Labs Review Labs Reviewed  COMPREHENSIVE METABOLIC PANEL - Abnormal; Notable for the following:    Potassium 2.8 (*)    Glucose, Bld 135 (*)    Albumin 2.9 (*)    AST 134 (*)    Alkaline Phosphatase 178 (*)    Total Bilirubin 1.3 (*)    All other components within normal limits  CBC - Abnormal; Notable for the following:    RBC 3.77 (*)    Hemoglobin 11.8 (*)    All other components within normal limits  URINALYSIS, ROUTINE W REFLEX MICROSCOPIC (NOT AT [facility]) - Abnormal; Notable for the following:    Color, Urine AMBER (*)    APPearance CLOUDY (*)     Bilirubin Urine MODERATE (*)    Ketones, ur 15 (*)    Leukocytes, UA SMALL (*)    All other components within normal limits  URINE MICROSCOPIC-ADD ON - Abnormal; Notable for the following:    Squamous Epithelial / LPF 0-5 (*)    Bacteria, UA RARE (*)    All other components within normal limits  LIPASE, BLOOD  TROPONIN I  MAGNESIUM    Imaging Review No results found. I have personally reviewed and evaluated these images and lab results as part of my medical decision-making.   EKG Interpretation None      MDM   Final diagnoses:  Epigastric pain  Hypokalemia  V-tach [facility])   64 year old female here with abdominal pain, nausea, and vomiting. Patient is afebrile, nontoxic. She does appear uncomfortable. Underwent endoscopic ultrasound by Dr. Dulce Sellar yesterday and had biopsy performed. Patient denies any chest pain or shortness of breath. Abdominal exam with TTP in epigastrium without peritonitis.  Workup pending including CBC, CMP, lipase, trop, u/a. Prior to my evaluation patient was given Zofran per nursing protocol, will give Dilaudid for pain as patient was previously on this at home.  1030-- notified by nurse that patient has 70 run of V. tach. She also had a brief desaturation after Dilaudid was administered which quickly resolved, 2L O2 in place as a precaution. Patient was placed on zoll pads.  EKG does show slightly prolonged QTc at 510, however has hx of same based on prior EKGs.  Patient did receive dose of zofran per nursing protocol on arrival to ED, will avoid further QT prolonging agents at this time.   12:07 PM Labwork remarkable for hypokalemia at 2.8. She also has some elevations of her AST, alkaline phosphatase, and bilirubin. On review of chart, patient has a history of the same. Her lipase is actually normal, however I do suspect she has some pain following FNA pancreatic biopsy from yesterday. Pain has been controlled here in the ED.  Abdomen remains soft without  peritoneal signs  Given her runs of V. Tach without known cardiac hx, feel patient would benefit from overnight and then and close cardiac  monitoring.  Initial trop WNL.  Patient was given K+ supplementation here.  Her VTach may be multifactorial given her hypokalemia and prolonged QT as well as undergoing anesthesia yesterday. Patient will be admitted for telemetry observation.  [facility] made aware patient is being admitted, they are available if needed.  Garlon Hatchet, PA-C 03/26/15 1343  Alvira Monday, MD 03/27/15 0730

## 2015-03-26 NOTE — ED Notes (Signed)
Critical Magnesium 0.8-Admitting MD paged.

## 2015-03-26 NOTE — Progress Notes (Signed)
CRITICAL VALUE ALERT  Critical value received:  Lactic acid 2.4  Date of notification:  03/26/2015  Time of notification:  1953  Critical value read back:Yes.    Nurse who received alert:  Johnette Abrahamassandra Mayerly Kaman, RN  MD notified (1st page):  Claiborne Billingsallahan, MD  Time of first page:  1954  Responding MD:  Claiborne Billingsallahan  Orders placed. Will continue to monitor.

## 2015-03-26 NOTE — ED Notes (Signed)
Patient has chronic pancreatitis, is followed by Dr Dulce Sellarutlaw.  States she feels like she having a flare-up that started this morning.  States her symptoms are just like a flare-up she had 3 years ago.  Patient in no apparent distress at this time.

## 2015-03-27 ENCOUNTER — Inpatient Hospital Stay (HOSPITAL_COMMUNITY): Payer: Medicare Other

## 2015-03-27 ENCOUNTER — Encounter (HOSPITAL_COMMUNITY): Payer: Self-pay

## 2015-03-27 DIAGNOSIS — M810 Age-related osteoporosis without current pathological fracture: Secondary | ICD-10-CM

## 2015-03-27 DIAGNOSIS — E876 Hypokalemia: Secondary | ICD-10-CM

## 2015-03-27 LAB — COMPREHENSIVE METABOLIC PANEL
ALT: 46 U/L (ref 14–54)
ANION GAP: 7 (ref 5–15)
AST: 128 U/L — ABNORMAL HIGH (ref 15–41)
Albumin: 2.4 g/dL — ABNORMAL LOW (ref 3.5–5.0)
Alkaline Phosphatase: 170 U/L — ABNORMAL HIGH (ref 38–126)
CO2: 25 mmol/L (ref 22–32)
Calcium: 7.6 mg/dL — ABNORMAL LOW (ref 8.9–10.3)
Chloride: 103 mmol/L (ref 101–111)
Creatinine, Ser: 0.78 mg/dL (ref 0.44–1.00)
GFR calc Af Amer: >60 mL/min (ref >60)
GFR calc non Af Amer: >60 mL/min (ref >60)
GLUCOSE: 112 mg/dL — AB (ref 65–99)
POTASSIUM: 4.1 mmol/L (ref 3.5–5.1)
SODIUM: 135 mmol/L (ref 135–145)
Total Bilirubin: 4.3 mg/dL — ABNORMAL HIGH (ref 0.3–1.2)
Total Protein: 5 g/dL — ABNORMAL LOW (ref 6.5–8.1)

## 2015-03-27 LAB — CBC
HEMATOCRIT: 33.2 % — AB (ref 36.0–46.0)
HEMOGLOBIN: 10.9 g/dL — AB (ref 12.0–15.0)
MCH: 32.2 pg (ref 26.0–34.0)
MCHC: 32.8 g/dL (ref 30.0–36.0)
MCV: 97.9 fL (ref 78.0–100.0)
Platelets: 166 10*3/uL (ref 150–400)
RBC: 3.39 MIL/uL — ABNORMAL LOW (ref 3.87–5.11)
RDW: 14 % (ref 11.5–15.5)
WBC: 6 10*3/uL (ref 4.0–10.5)

## 2015-03-27 MED ORDER — PANTOPRAZOLE SODIUM 40 MG IV SOLR
40.0000 mg | Freq: Two times a day (BID) | INTRAVENOUS | Status: DC
  Administered 2015-03-27 – 2015-03-30 (×6): 40 mg via INTRAVENOUS
  Filled 2015-03-27 (×6): qty 40

## 2015-03-27 MED ORDER — TECHNETIUM TC 99M MEBROFENIN IV KIT
5.2500 | PACK | Freq: Once | INTRAVENOUS | Status: AC | PRN
  Administered 2015-03-27: 5 via INTRAVENOUS

## 2015-03-27 MED ORDER — PIPERACILLIN-TAZOBACTAM 3.375 G IVPB
3.3750 g | Freq: Three times a day (TID) | INTRAVENOUS | Status: DC
  Administered 2015-03-27 – 2015-04-02 (×19): 3.375 g via INTRAVENOUS
  Filled 2015-03-27 (×21): qty 50

## 2015-03-27 MED ORDER — SODIUM CHLORIDE 0.9 % IV SOLN
INTRAVENOUS | Status: DC
  Administered 2015-03-27 – 2015-03-31 (×7): via INTRAVENOUS

## 2015-03-27 NOTE — Progress Notes (Signed)
ANTIBIOTIC CONSULT NOTE - INITIAL  Pharmacy Consult for Zosyn Indication: acute cholecystitis  Allergies  Allergen Reactions  . Codeine Hives    Causes blisters  . Tramadol Other (See Comments)    Loss of consciousness     Patient Measurements: Height: 4' 11.5" (151.1 cm) Weight: 119 lb 9.6 oz (54.25 kg) IBW/kg (Calculated) : 44.35  Vital Signs: Temp: 98.7 F (37.1 C) (12/09 0510) Temp Source: Oral (12/09 0510) BP: 113/77 mmHg (12/09 0510) Pulse Rate: 92 (12/09 0510) Intake/Output from previous day: 12/08 0701 - 12/09 0700 In: 1158.3 [I.V.:1158.3] Out: 3 [Urine:3] Intake/Output from this shift:    Labs:  Recent Labs  03/26/15 1109 03/26/15 1513 03/27/15 0512  WBC 5.3  --  6.0  HGB 11.8*  --  10.9*  PLT 158  --  166  CREATININE 0.77 0.81 0.78   Estimated Creatinine Clearance: 55.7 mL/min (by C-G formula based on Cr of 0.78). No results for input(s): VANCOTROUGH, VANCOPEAK, VANCORANDOM, GENTTROUGH, GENTPEAK, GENTRANDOM, TOBRATROUGH, TOBRAPEAK, TOBRARND, AMIKACINPEAK, AMIKACINTROU, AMIKACIN in the last 72 hours.   Microbiology: No results found for this or any previous visit (from the past 720 hour(s)).  Medical History: Past Medical History  Diagnosis Date  . Osteoporosis   . Alcoholic liver disease (HCC)   . High cholesterol   . Ascites 09/22/11    "first time for , now resolved  . Pancreatitis several years ago  . History of blood transfusion 07/2011    "when I had elbow surgery"  . GERD (gastroesophageal reflux disease)   . Staph infection     left foot  . Fracture, humerus 07/2011    right  . Dysrhythmia     "irregular"  . Arthritis     "all over"  . Peripheral vascular disease (HCC)   . Hypertension     not current  . Osteomyelitis (HCC)     left foot  . Myocardial infarction [facility](HCC)     NSTEMI 12/2011 in setting of septic shock    Assessment: 64 year old female to begin Zosyn for acute cholecystitits  Goal of Therapy:  Appropriate  dosing  Plan:  Zosyn 3.375 grams iv Q 8 hours - 4 hr infusion Pharmacy to sign off and follow peripherally  Thank you Okey RegalLisa Jemmie Ledgerwood, PharmD [phone]  03/27/2015,8:27 AM

## 2015-03-27 NOTE — Consult Note (Signed)
[facility] CONSULT Reason for consult: pancreatitis with possible biliary disease. Referring Physician: [facility] hospital[facility]. PCP: Dr. Alroy Dust. Primary G.I.: Dr. Marlon Pel is an 64 y.o. female.  HPI: She has been evaluated for chronic pancreatitis by Dr. Paulita Fujita. The etiology is still somewhat unclear in that she has a history of alcohol abuse but stated that she never really drank heavily because she had to work 80 hours a week and only drank after she finished working. She has had workup including MRI showed chronically scarred pancreas with an abrupt cutoff of the pancreatic duct questionable due to a lesion. CA 19 9 was normal and Dr. Paulita Fujita performed EUS just 2 days ago which showed diffuse chronic pancreatitis with scarring but no clear masses. No biopsies were performed in notably no CBD stones were noted. She went home following this procedure 2 days ago and did well until the next morning when she awoke with upper abdominal pain and nausea. This became progressively worse and she presented to the emergency room. Her amylase and lipase were normal. Liver tests were initially normal but her total bilirubin has risen to 4.3. CT scan was obtained showing [place] liver and what appeared to be acute cholecystitis due to gallstones. The CBD was not noted to be dilated. She did have chronic calcific pancreatitis and cirrhosis. Hepatobiliary scan is pending and she is being treated with antibiotics. The patient is known to have gallstones on previous gallbladder ultrasounds.  Past Medical History  Diagnosis Date  . Osteoporosis   . Alcoholic liver disease ([place])   . High cholesterol   . Ascites 09/22/11    "first time for , now resolved  . Pancreatitis several years ago  . History of blood transfusion 07/2011    "when I had elbow surgery"  . GERD (gastroesophageal reflux disease)   . Staph infection     left foot  . Fracture, humerus 07/2011    right  . Dysrhythmia      "irregular"  . Arthritis     "all over"  . Peripheral vascular disease ([place])   . Hypertension     not current  . Osteomyelitis ([place])     left foot  . Myocardial infarction [facility])     NSTEMI 12/2011 in setting of septic shock    Past Surgical History  Procedure Laterality Date  . Amputation      two toes on left foot  . Bunionectomy      both feet  . Arthroscopic repair acl      right knee  . Orif humerus fracture  07/26/2011    Procedure: OPEN REDUCTION INTERNAL FIXATION (ORIF) DISTAL HUMERUS FRACTURE;  Surgeon: Rozanna Box, MD;  Location: [place];  Service: Orthopedics;  Laterality: Right;  . Total hip arthroplasty  ~ 2003    right  . Tubal ligation  1980  . Paracentesis  09/22/11    2.2L  . Esophagogastroduodenoscopy  09/23/2011    Procedure: ESOPHAGOGASTRODUODENOSCOPY (EGD);  Surgeon: Lear Ng, MD;  Location: [facility] ENDOSCOPY;  Service: Endoscopy;  Laterality: N/A;  . Transmetatarsal amputation  02/16/2012    Procedure: TRANSMETATARSAL AMPUTATION;  Surgeon: Newt Minion, MD;  Location: [place];  Service: Orthopedics;  Laterality: Left;  . Fracture surgery  2013    Right elbow  . Joint replacement      right hip   . Esophagogastroduodenoscopy (egd) with propofol N/A 08/22/2012    Procedure: ESOPHAGOGASTRODUODENOSCOPY (EGD) WITH PROPOFOL;  Surgeon: Arta Silence,  MD;  Location: [facility] ENDOSCOPY;  Service: Endoscopy;  Laterality: N/A;  . Leg surgery Left     Correction of unequal leg length  . Orif wrist fracture Left 12/26/2014    Procedure: OPEN REDUCTION INTERNAL FIXATION (ORIF) WRIST FRACTURE;  Surgeon: Meredith Pel, MD;  Location: [place];  Service: Orthopedics;  Laterality: Left;  . Eus N/A 03/25/2015    Procedure: UPPER ENDOSCOPIC ULTRASOUND (EUS) RADIAL;  Surgeon: Arta Silence, MD;  Location: [facility] ENDOSCOPY;  Service: Endoscopy;  Laterality: N/A;  . Fine needle aspiration N/A 03/25/2015    Procedure: FINE NEEDLE ASPIRATION (FNA) RADIAL;  Surgeon: Arta Silence, MD;   Location: [facility] ENDOSCOPY;  Service: Endoscopy;  Laterality: N/A;    History reviewed. No pertinent family history.  Social History:  reports that she has never smoked. She has never used smokeless tobacco. She reports that she does not drink alcohol or use illicit drugs.  Allergies:  Allergies  Allergen Reactions  . Codeine Hives    Causes blisters  . Tramadol Other (See Comments)    Loss of consciousness     Medications; Prior to Admission medications   Medication Sig Start Date End Date Taking? Authorizing Provider  alendronate (FOSAMAX) 70 MG tablet Take 70 mg by mouth once a week. Sundays 03/04/15  Yes Historical Provider, MD  allopurinol (ZYLOPRIM) 100 MG tablet Take 100 mg by mouth 2 (two) times daily.   Yes Historical Provider, MD  colchicine 0.6 MG tablet Take 0.6 mg by mouth 2 (two) times daily as needed (gout).   Yes Historical Provider, MD  [employer] CALCIUM CITRATE +D3 MINI 200-250 MG-UNIT TABS Take 1 tablet by mouth 2 (two) times daily with a meal. 02/08/15  Yes Historical Provider, MD  folic acid (FOLVITE) 1 MG tablet Take 1 tablet (1 mg total) by mouth daily. 04/20/12  Yes Vivi Ferns, MD  furosemide (LASIX) 40 MG tablet Take 1 tablet (40 mg total) by mouth daily. 12/26/14  Yes Scott Marcene Duos, MD  gabapentin (NEURONTIN) 100 MG capsule Take 200 mg by mouth 3 (three) times daily as needed. Nerve pain 02/10/15  Yes Historical Provider, MD  HYDROmorphone (DILAUDID) 2 MG tablet Take 2 mg by mouth every 4 (four) hours as needed for severe pain.   Yes Historical Provider, MD  mirtazapine (REMERON) 15 MG tablet Take 1 tablet (15 mg total) by mouth at bedtime. 03/26/12  Yes Vivi Ferns, MD  omeprazole (PRILOSEC) 40 MG capsule Take 40 mg by mouth daily.   Yes Historical Provider, MD  propranolol (INDERAL) 10 MG tablet Take 1 tablet (10 mg total) by mouth 2 (two) times daily. 03/26/12  Yes Vivi Ferns, MD  spironolactone (ALDACTONE) 50 MG tablet Take 50 mg by mouth daily.   Yes Historical  Provider, MD  thiamine (VITAMIN B-1) 100 MG tablet Take 1 tablet (100 mg total) by mouth daily. 04/20/12  Yes Vivi Ferns, MD   . allopurinol  100 mg Oral BID  . heparin  5,000 Units Subcutaneous 3 times per day  . pantoprazole (PROTONIX) IV  40 mg Intravenous Q12H  . piperacillin-tazobactam (ZOSYN)  IV  3.375 g Intravenous Q8H  . propranolol  10 mg Oral BID  . sodium chloride  3 mL Intravenous Q12H   PRN Meds acetaminophen **OR** acetaminophen, HYDROmorphone (DILAUDID) injection, polyethylene glycol Results for orders placed or performed during the hospital encounter of 03/26/15 (from the past 48 hour(s))  Urinalysis, Routine w reflex microscopic (not at [facility])  Status: Abnormal   Collection Time: 03/26/15 11:01 AM  Result Value Ref Range   Color, Urine AMBER (A) YELLOW    Comment: BIOCHEMICALS MAY BE AFFECTED BY COLOR   APPearance CLOUDY (A) CLEAR   Specific Gravity, Urine 1.021 1.005 - 1.030   pH 6.0 5.0 - 8.0   Glucose, UA NEGATIVE NEGATIVE mg/dL   Hgb urine dipstick NEGATIVE NEGATIVE   Bilirubin Urine MODERATE (A) NEGATIVE   Ketones, ur 15 (A) NEGATIVE mg/dL   Protein, ur NEGATIVE NEGATIVE mg/dL   Nitrite NEGATIVE NEGATIVE   Leukocytes, UA SMALL (A) NEGATIVE  Urine microscopic-add on     Status: Abnormal   Collection Time: 03/26/15 11:01 AM  Result Value Ref Range   Squamous Epithelial / LPF 0-5 (A) NONE SEEN   WBC, UA 0-5 0 - 5 WBC/hpf   RBC / HPF 0-5 0 - 5 RBC/hpf   Bacteria, UA RARE (A) NONE SEEN  Lipase, blood     Status: None   Collection Time: 03/26/15 11:09 AM  Result Value Ref Range   Lipase 42 11 - 51 U/L  Comprehensive metabolic panel     Status: Abnormal   Collection Time: 03/26/15 11:09 AM  Result Value Ref Range   Sodium 143 135 - 145 mmol/L   Potassium 2.8 (L) 3.5 - 5.1 mmol/L   Chloride 105 101 - 111 mmol/L   CO2 27 22 - 32 mmol/L   Glucose, Bld 135 (H) 65 - 99 mg/dL   BUN 7 6 - 20 mg/dL   Creatinine, Ser 0.77 0.44 - 1.00 mg/dL   Calcium 9.0 8.9 -  10.3 mg/dL   Total Protein 6.5 6.5 - 8.1 g/dL   Albumin 2.9 (L) 3.5 - 5.0 g/dL   AST 134 (H) 15 - 41 U/L   ALT 30 14 - 54 U/L   Alkaline Phosphatase 178 (H) 38 - 126 U/L   Total Bilirubin 1.3 (H) 0.3 - 1.2 mg/dL   GFR calc non Af Amer >60 >60 mL/min   GFR calc Af Amer >60 >60 mL/min    Comment: (NOTE) The eGFR has been calculated using the CKD EPI equation. This calculation has not been validated in all clinical situations. eGFR's persistently <60 mL/min signify possible Chronic Kidney Disease.    Anion gap 11 5 - 15  CBC     Status: Abnormal   Collection Time: 03/26/15 11:09 AM  Result Value Ref Range   WBC 5.3 4.0 - 10.5 K/uL   RBC 3.77 (L) 3.87 - 5.11 MIL/uL   Hemoglobin 11.8 (L) 12.0 - 15.0 g/dL   HCT 36.0 36.0 - 46.0 %   MCV 95.5 78.0 - 100.0 fL   MCH 31.3 26.0 - 34.0 pg   MCHC 32.8 30.0 - 36.0 g/dL   RDW 13.7 11.5 - 15.5 %   Platelets 158 150 - 400 K/uL  Troponin I     Status: None   Collection Time: 03/26/15 11:09 AM  Result Value Ref Range   Troponin I <0.03 <0.031 ng/mL    Comment:        NO INDICATION OF MYOCARDIAL INJURY.   Magnesium     Status: Abnormal   Collection Time: 03/26/15 11:09 AM  Result Value Ref Range   Magnesium 0.8 (LL) 1.7 - 2.4 mg/dL    Comment: CRITICAL RESULT CALLED TO, READ BACK BY AND VERIFIED WITH: HALEY SHARP,RN AT 1420 03/26/15 BY ZBEECH.   Amylase     Status: None   Collection Time: 03/26/15  11:09 AM  Result Value Ref Range   Amylase 95 28 - 100 U/L  Lactic acid, plasma     Status: Abnormal   Collection Time: 03/26/15  3:13 PM  Result Value Ref Range   Lactic Acid, Venous 3.5 (HH) 0.5 - 2.0 mmol/L    Comment: CRITICAL RESULT CALLED TO, READ BACK BY AND VERIFIED WITH: K DUFFY,RN 1552 03/26/15 D BRADLEY   Basic metabolic panel     Status: Abnormal   Collection Time: 03/26/15  3:13 PM  Result Value Ref Range   Sodium 142 135 - 145 mmol/L   Potassium 3.5 3.5 - 5.1 mmol/L    Comment: NO VISIBLE HEMOLYSIS   Chloride 103 101 - 111  mmol/L   CO2 29 22 - 32 mmol/L   Glucose, Bld 116 (H) 65 - 99 mg/dL   BUN 8 6 - 20 mg/dL   Creatinine, Ser 0.81 0.44 - 1.00 mg/dL   Calcium 8.9 8.9 - 10.3 mg/dL   GFR calc non Af Amer >60 >60 mL/min   GFR calc Af Amer >60 >60 mL/min    Comment: (NOTE) The eGFR has been calculated using the CKD EPI equation. This calculation has not been validated in all clinical situations. eGFR's persistently <60 mL/min signify possible Chronic Kidney Disease.    Anion gap 10 5 - 15  Magnesium     Status: None   Collection Time: 03/26/15  3:13 PM  Result Value Ref Range   Magnesium 1.9 1.7 - 2.4 mg/dL  Lactic acid, plasma     Status: Abnormal   Collection Time: 03/26/15  4:48 PM  Result Value Ref Range   Lactic Acid, Venous 3.8 (HH) 0.5 - 2.0 mmol/L    Comment: CRITICAL RESULT CALLED TO, READ BACK BY AND VERIFIED WITH: K DUFFY,RN 1807 03/26/15 D BRADLEY   Lactic acid, plasma     Status: Abnormal   Collection Time: 03/26/15  7:08 PM  Result Value Ref Range   Lactic Acid, Venous 2.4 (HH) 0.5 - 2.0 mmol/L    Comment: CRITICAL RESULT CALLED TO, READ BACK BY AND VERIFIED WITH: C BEAIRD,RN 1953 03/26/15 D BRADLEY   Procalcitonin - Baseline     Status: None   Collection Time: 03/26/15  7:08 PM  Result Value Ref Range   Procalcitonin <0.10 ng/mL    Comment:        Interpretation: PCT (Procalcitonin) <= 0.5 ng/mL: Systemic infection (sepsis) is not likely. Local bacterial infection is possible. (NOTE)         ICU PCT Algorithm               Non ICU PCT Algorithm    ----------------------------     ------------------------------         PCT < 0.25 ng/mL                 PCT < 0.1 ng/mL     Stopping of antibiotics            Stopping of antibiotics       strongly encouraged.               strongly encouraged.    ----------------------------     ------------------------------       PCT level decrease by               PCT < 0.25 ng/mL       >= 80% from peak PCT       OR PCT 0.25 - 0.5  ng/mL           Stopping of antibiotics                                             encouraged.     Stopping of antibiotics           encouraged.    ----------------------------     ------------------------------       PCT level decrease by              PCT >= 0.25 ng/mL       < 80% from peak PCT        AND PCT >= 0.5 ng/mL            Continuin g antibiotics                                              encouraged.       Continuing antibiotics            encouraged.    ----------------------------     ------------------------------     PCT level increase compared          PCT > 0.5 ng/mL         with peak PCT AND          PCT >= 0.5 ng/mL             Escalation of antibiotics                                          strongly encouraged.      Escalation of antibiotics        strongly encouraged.   Comprehensive metabolic panel     Status: Abnormal   Collection Time: 03/27/15  5:12 AM  Result Value Ref Range   Sodium 135 135 - 145 mmol/L   Potassium 4.1 3.5 - 5.1 mmol/L   Chloride 103 101 - 111 mmol/L   CO2 25 22 - 32 mmol/L   Glucose, Bld 112 (H) 65 - 99 mg/dL   BUN <5 (L) 6 - 20 mg/dL   Creatinine, Ser 0.78 0.44 - 1.00 mg/dL   Calcium 7.6 (L) 8.9 - 10.3 mg/dL   Total Protein 5.0 (L) 6.5 - 8.1 g/dL   Albumin 2.4 (L) 3.5 - 5.0 g/dL   AST 128 (H) 15 - 41 U/L   ALT 46 14 - 54 U/L   Alkaline Phosphatase 170 (H) 38 - 126 U/L   Total Bilirubin 4.3 (H) 0.3 - 1.2 mg/dL   GFR calc non Af Amer >60 >60 mL/min   GFR calc Af Amer >60 >60 mL/min    Comment: (NOTE) The eGFR has been calculated using the CKD EPI equation. This calculation has not been validated in all clinical situations. eGFR's persistently <60 mL/min signify possible Chronic Kidney Disease.    Anion gap 7 5 - 15  CBC     Status: Abnormal   Collection Time: 03/27/15  5:12 AM  Result Value Ref Range   WBC 6.0 4.0 - 10.5 K/uL   RBC 3.39 (L) 3.87 - 5.11 MIL/uL   Hemoglobin 10.9 (L)  12.0 - 15.0 g/dL   HCT 33.2 (L) 36.0 - 46.0 %    MCV 97.9 78.0 - 100.0 fL   MCH 32.2 26.0 - 34.0 pg   MCHC 32.8 30.0 - 36.0 g/dL   RDW 14.0 11.5 - 15.5 %   Platelets 166 150 - 400 K/uL    Ct Abdomen Pelvis W Contrast  03/26/2015  CLINICAL DATA:  Left upper quadrant abdominal pain and tenderness with nausea and vomiting. History of alcoholic liver disease, pancreatitis and colitis. EXAM: CT ABDOMEN AND PELVIS WITH CONTRAST TECHNIQUE: Multidetector CT imaging of the abdomen and pelvis was performed using the standard protocol following bolus administration of intravenous contrast. CONTRAST:  144mL OMNIPAQUE IOHEXOL 300 MG/ML  SOLN COMPARISON:  Abdominal pelvic CT 01/02/2012. Abdominal MRI 02/26/2015. FINDINGS: Lower chest: Trace pleural effusions with mild bibasilar atelectasis, improved from previous CT. Small hiatal hernia. Hepatobiliary: The liver has a stable appearance with diffuse steatosis, relative enlargement of the left lobe and scarring and fibrosis throughout the right lobe. No focal hepatic lesions are identified. There are several small calcified gallstones. There is new gallbladder distention with wall thickening and surrounding inflammation worrisome for acute cholecystitis. There is mild prominence of the extrahepatic biliary system which measures up to 8 mm in diameter. No intraductal calculi seen. Pancreas: There are multiple parenchymal calcifications throughout the pancreas with associated atrophy consistent with chronic calcific pancreatitis. There is no evidence of pancreatic mass, significant ductal dilatation or surrounding inflammation. Spleen: Normal in size without focal abnormality. Adrenals/Urinary Tract: Both adrenal glands appear normal. The kidneys appear stable without focal abnormality, hydronephrosis or nephrolithiasis. The bladder is nearly empty and suboptimally evaluated. Stomach/Bowel: No evidence of bowel wall thickening, distention or surrounding inflammatory change. Vascular/Lymphatic: There are no enlarged  abdominal or pelvic lymph nodes. Mild aortoiliac atherosclerosis. Reproductive: Stable. There is a left uterine fundal fibroid. There are mild gonadal vein varices on the left. Other: No ascites or peritoneal nodularity. Small umbilical hernia containing only fat. Musculoskeletal: Right total hip arthroplasty and posttraumatic deformities within the left pubic bones are noted. There is chronic left femoral head avascular necrosis. There is a moderate convex left lumbar scoliosis with associated spondylosis. There is chronic atrophy of the right iliopsoas musculature. IMPRESSION: 1. New gallbladder distention, wall thickening and surrounding inflammatory changes worrisome for acute cholecystitis. 2. Calcific pancreatitis without evidence of acute inflammation or significant ductal dilatation. 3. Stable cirrhotic changes within the liver with fibrosis in the right lobe. 4. Stable osseous findings, including the spondylosis and chronic left femoral head avascular necrosis. Electronically Signed   By: Richardean Sale M.D.   On: 03/26/2015 21:30   Dg Chest Port 1 View  03/26/2015  CLINICAL DATA:  Myocardial infarction.  Ventricular tachycardia EXAM: PORTABLE CHEST 1 VIEW COMPARISON:  09/29/2014 FINDINGS: Normal heart size and stable aortic contours. There is mild atelectatic type opacity at the medial right base. No edema, effusion, or consolidation. Remote left sixth rib fracture. No acute osseous finding. IMPRESSION: Mild right basilar atelectasis. Electronically Signed   By: Monte Fantasia M.D.   On: 03/26/2015 11:13   Dg Abd 2 Views  03/26/2015  CLINICAL DATA:  Nausea and vomiting EXAM: ABDOMEN - 2 VIEW COMPARISON:  None. FINDINGS: Scattered large and small bowel gas is noted. Degenerative changes of the lumbar spine are seen with a scoliosis concave to the right. Postsurgical changes in the right hip are noted. Prior fractures in the left pubic rami are seen. No free air is noted. No acute  abnormality noted.  IMPRESSION: No acute abnormality seen. Electronically Signed   By: Inez Catalina M.D.   On: 03/26/2015 14:04               Blood pressure 113/77, pulse 92, temperature 98.7 F (37.1 C), temperature source Oral, resp. rate 18, height 4' 11.5" (1.511 m), weight 54.25 kg (119 lb 9.6 oz), SpO2 92 %.  Physical exam:   General--alert nonicteric white female in no distress  ENT--square nonicteric mucous membranes somewhat dry  Neck--lymphadenopathy  Heart--slight heart murmur  Lungs--clear  Abdomen-- mild upper tenderness right more than left few bowel sounds present  Psych- alert and oriented  Assessment: 1. Abdominal pain/nausea. Appears to have acute cholecystitis by CT scan. Does have chronic pancreatitis but amylase and lipase appear to be normal and no severe inflammation around pancreas imaging studies 2. Chronic pancreatitis. Probably due to alcohol 3 Alcoholic Cirrhosis.   Plan: agree with empiric antibiotics for acute cholecystitis given the CT findings. It would be reasonable to go ahead with the hepatobiliary scan. She does not appear to have acute pancreatitis will follow with you.    Mekiah Wahler JR,Seraphina Mitchner L 03/27/2015, 11:01 AM   Pager: [phone] If no answer or after hours call [phone]

## 2015-03-27 NOTE — Progress Notes (Signed)
[facility]                                                                              Patient Demographics  Kathryn [name], is a 64 y.o. female, DOB - February 15, 1951, RUE:[id number]  Admit date - 03/26/2015   Admitting Physician Alba Cory, MD  Outpatient Primary MD for the patient is Lupe Carney, MD  LOS - 1   Chief Complaint  Patient presents with  . Pancreatitis       Brief HPI   Patient is a 64 year old female with prior history of alcohol use, cirrhosis, chronic pancreatitis, PVD, hypertension.  She presented to the ER after a sudden onset of abdominal pain and vomiting on the morning of admission. Patient had an EUS (without biopsy) on 12/7 by Dr. Dulce Sellar. Her bile duct was not cannulated during the procedure. She felt well immediately after the procedure however on the morning of admission she woke up at 5 AM with vomiting and pain. In the emergency department her potassium was 2.8 and mag was 0.8. Total bili was 1/2 and AST 134. Amylase 95, Lipase 42. Xrays of her chest and abdomen were negative. EKG shows sinus rhythm with a prolonged QTc. Patient was admitted for acute on chronic pancreatitis.  Assessment & Plan    Principal Problem:  Acute exacerbation of chronic pancreatitis - CT scan of the abdomen and pelvis reviewed, shows gallbladder wall edema with acute cholecystitis.  - Placed on NPO status, IV fluid hydration, IV Zosyn, pain control -  discussed with general surgery, recommended HIDA scan and follow - GI consult obtained, Follow-up recommendations   Active problems  Lactic acidosis with dehydration  - Continue IV fluid hydration, lactic acid improving    Palpitations: Patient had a short run of V. tach (7 beats) likely due to electrolyte abnormalities on admission. - BMET stable, keep K ~4, MG >2  Alcoholic liver disease with possible pancreatic duct obstruction/lesion MRI performed in November 2016 showed possible pancreatic  lesion. EUS performed yesterday by Dr. Dulce Sellar. Continue outpatient management.  Code Status: Full CODE STATUS   Family Communication: Discussed in detail with the patient, all imaging results, lab results explained to the patient    Disposition Plan:   Time Spent in minutes  25 minutes  Procedures  CT abd  Consults   GI Surgery  DVT Prophylaxis heparin subcutaneous   Medications  Scheduled Meds: . allopurinol  100 mg Oral BID  . heparin  5,000 Units Subcutaneous 3 times per day  . pantoprazole (PROTONIX) IV  40 mg Intravenous Q12H  . piperacillin-tazobactam (ZOSYN)  IV  3.375 g Intravenous Q8H  . propranolol  10 mg Oral BID  . sodium chloride  3 mL Intravenous Q12H   Continuous Infusions: . sodium chloride 125 mL/hr at 03/27/15 0946   PRN Meds:.acetaminophen **OR** acetaminophen, HYDROmorphone (DILAUDID) injection, polyethylene glycol   Antibiotics   Anti-infectives    Start     Dose/Rate Route Frequency Ordered Stop   03/27/15 0830  piperacillin-tazobactam (ZOSYN) IVPB 3.375 g     3.375 g 12.5 mL/hr over 240 Minutes Intravenous Every 8 hours 03/27/15 0826  Subjective:   Kathryn [name] was seen and examined today. Complaining of abdominal pain, 8/10, no nausea or vomiting, fevers or chills. Patient denies dizziness, chest pain, shortness of breath, new weakness, numbess, tingling. No acute events overnight.    Objective:   Blood pressure 113/77, pulse 92, temperature 98.7 F (37.1 C), temperature source Oral, resp. rate 18, height 4' 11.5" (1.511 m), weight 54.25 kg (119 lb 9.6 oz), SpO2 92 %.  Wt Readings from Last 3 Encounters:  03/26/15 54.25 kg (119 lb 9.6 oz)  12/26/14 49.442 kg (109 lb)  12/25/14 49.442 kg (109 lb)     Intake/Output Summary (Last 24 hours) at 03/27/15 1115 Last data filed at 03/27/15 1021  Gross per 24 hour  Intake 1158.34 ml  Output      3 ml  Net 1155.34 ml    Exam  General: Alert and oriented x 3, NAD  HEENT:   PERRLA, EOMI, Anicteric Sclera, mucous membranes moist.   Neck: Supple, no JVD, no masses  CVS: S1 S2 auscultated, no rubs, murmurs or gallops. Regular rate and rhythm.  Respiratory: Clear to auscultation bilaterally, no wheezing, rales or rhonchi  Abdomen: Soft, RUQ TTP, nondistended, + bowel sounds  Ext: no cyanosis clubbing or edema  Neuro: AAOx3, Cr N's II- XII. Strength 5/5 upper and lower extremities bilaterally  Skin: No rashes  Psych: Normal affect and demeanor, alert and oriented x3    Data Review   Micro Results No results found for this or any previous visit (from the past 240 hour(s)).  Radiology Reports Mr Abdomen W Wo Contrast  02/26/2015  CLINICAL DATA:  Evaluate cystic lesion within the pancreas. EXAM: MRI ABDOMEN WITHOUT AND WITH CONTRAST TECHNIQUE: Multiplanar multisequence MR imaging of the abdomen was performed both before and after the administration of intravenous contrast. CONTRAST:  9mL MULTIHANCE GADOBENATE DIMEGLUMINE 529 MG/ML IV SOLN COMPARISON:  08/22/2014 FINDINGS: Lower chest: No significant pleural fluid identified. No pericardial fluid identified. Hepatobiliary: Stones identified within the gallbladder fundus. No gallbladder wall thickening. No biliary dilatation. The liver appears cirrhotic. There is atrophy and fibrosis of the right hepatic lobe. There is diffuse hepatic steatosis identified. Pancreas: There is increase caliber of the pancreatic duct within the body and tail of pancreas. This measures up to 3 mm and there is an abrupt cut off of the prominent pancreatic duct at the junction between the neck and body of the pancreas. New from previous exam is mild soft tissue stranding surrounding the head and tail of pancreas which appears to extend around the splenic artery as it originates from the cystic duct. The small cystic area within tail of pancreas is again identified measuring 1 cm, image 17 of series 11. Spleen: Negative Adrenals/Urinary  Tract: Normal appearance of the adrenal glands. Unremarkable appearance of the kidneys. Stomach/Bowel: The stomach is normal. The visualized small and large bowel loops are unremarkable. Vascular/Lymphatic: Normal appearance of the abdominal aorta. Insert nodes Other: No free fluid or fluid collections within the upper abdomen. Musculoskeletal: Normal signal identified within the bone marrow. There is a scoliosis deformity involving the lumbar spine which is convex towards the left. IMPRESSION: 1. Spectrum of findings on today's exam are suspicious for underlying pancreatic lesion. There is focal dilatation of the pancreatic duct within the body and tail tail with abrupt cut off at the head/neck junction. Mild soft tissue stranding surrounding the pancreas noted within this area. Further investigation with endoscopic ultrasound and possible biopsy is recommended to rule out the possibility  of underlying pancreatic lesion including adenocarcinoma. 2. Cirrhosis. Electronically Signed   By: Signa Kellaylor  Stroud M.D.   On: 02/26/2015 13:52   Ct Abdomen Pelvis W Contrast  03/26/2015  CLINICAL DATA:  Left upper quadrant abdominal pain and tenderness with nausea and vomiting. History of alcoholic liver disease, pancreatitis and colitis. EXAM: CT ABDOMEN AND PELVIS WITH CONTRAST TECHNIQUE: Multidetector CT imaging of the abdomen and pelvis was performed using the standard protocol following bolus administration of intravenous contrast. CONTRAST:  100mL OMNIPAQUE IOHEXOL 300 MG/ML  SOLN COMPARISON:  Abdominal pelvic CT 01/02/2012. Abdominal MRI 02/26/2015. FINDINGS: Lower chest: Trace pleural effusions with mild bibasilar atelectasis, improved from previous CT. Small hiatal hernia. Hepatobiliary: The liver has a stable appearance with diffuse steatosis, relative enlargement of the left lobe and scarring and fibrosis throughout the right lobe. No focal hepatic lesions are identified. There are several small calcified gallstones.  There is new gallbladder distention with wall thickening and surrounding inflammation worrisome for acute cholecystitis. There is mild prominence of the extrahepatic biliary system which measures up to 8 mm in diameter. No intraductal calculi seen. Pancreas: There are multiple parenchymal calcifications throughout the pancreas with associated atrophy consistent with chronic calcific pancreatitis. There is no evidence of pancreatic mass, significant ductal dilatation or surrounding inflammation. Spleen: Normal in size without focal abnormality. Adrenals/Urinary Tract: Both adrenal glands appear normal. The kidneys appear stable without focal abnormality, hydronephrosis or nephrolithiasis. The bladder is nearly empty and suboptimally evaluated. Stomach/Bowel: No evidence of bowel wall thickening, distention or surrounding inflammatory change. Vascular/Lymphatic: There are no enlarged abdominal or pelvic lymph nodes. Mild aortoiliac atherosclerosis. Reproductive: Stable. There is a left uterine fundal fibroid. There are mild gonadal vein varices on the left. Other: No ascites or peritoneal nodularity. Small umbilical hernia containing only fat. Musculoskeletal: Right total hip arthroplasty and posttraumatic deformities within the left pubic bones are noted. There is chronic left femoral head avascular necrosis. There is a moderate convex left lumbar scoliosis with associated spondylosis. There is chronic atrophy of the right iliopsoas musculature. IMPRESSION: 1. New gallbladder distention, wall thickening and surrounding inflammatory changes worrisome for acute cholecystitis. 2. Calcific pancreatitis without evidence of acute inflammation or significant ductal dilatation. 3. Stable cirrhotic changes within the liver with fibrosis in the right lobe. 4. Stable osseous findings, including the spondylosis and chronic left femoral head avascular necrosis. Electronically Signed   By: Carey BullocksWilliam  Veazey M.D.   On: 03/26/2015  21:30   Dg Chest Port 1 View  03/26/2015  CLINICAL DATA:  Myocardial infarction.  Ventricular tachycardia EXAM: PORTABLE CHEST 1 VIEW COMPARISON:  09/29/2014 FINDINGS: Normal heart size and stable aortic contours. There is mild atelectatic type opacity at the medial right base. No edema, effusion, or consolidation. Remote left sixth rib fracture. No acute osseous finding. IMPRESSION: Mild right basilar atelectasis. Electronically Signed   By: Marnee SpringJonathon  Watts M.D.   On: 03/26/2015 11:13   Dg Abd 2 Views  03/26/2015  CLINICAL DATA:  Nausea and vomiting EXAM: ABDOMEN - 2 VIEW COMPARISON:  None. FINDINGS: Scattered large and small bowel gas is noted. Degenerative changes of the lumbar spine are seen with a scoliosis concave to the right. Postsurgical changes in the right hip are noted. Prior fractures in the left pubic rami are seen. No free air is noted. No acute abnormality noted. IMPRESSION: No acute abnormality seen. Electronically Signed   By: Alcide CleverMark  Lukens M.D.   On: 03/26/2015 14:04    CBC  Recent Labs Lab 03/26/15 1109 03/27/15  0512  WBC 5.3 6.0  HGB 11.8* 10.9*  HCT 36.0 33.2*  PLT 158 166  MCV 95.5 97.9  MCH 31.3 32.2  MCHC 32.8 32.8  RDW 13.7 14.0    Chemistries   Recent Labs Lab 03/26/15 1109 03/26/15 1513 03/27/15 0512  NA 143 142 135  K 2.8* 3.5 4.1  CL 105 103 103  CO2 GLUCOSE 135* 116* 112*  BUN 7 8 <5*  CREATININE 0.77 0.81 0.78  CALCIUM 9.0 8.9 7.6*  MG 0.8* 1.9  --   AST 134*  --  128*  ALT 30  --  46  ALKPHOS 178*  --  170*  BILITOT 1.3*  --  4.3*   ------------------------------------------------------------------------------------------------------------------ estimated creatinine clearance is 55.7 mL/min (by C-G formula based on Cr of 0.78). ------------------------------------------------------------------------------------------------------------------ No results for input(s): HGBA1C in the last 72  hours. ------------------------------------------------------------------------------------------------------------------ No results for input(s): CHOL, HDL, LDLCALC, TRIG, CHOLHDL, LDLDIRECT in the last 72 hours. ------------------------------------------------------------------------------------------------------------------ No results for input(s): TSH, T4TOTAL, T3FREE, THYROIDAB in the last 72 hours.  Invalid input(s): FREET3 ------------------------------------------------------------------------------------------------------------------ No results for input(s): VITAMINB12, FOLATE, FERRITIN, TIBC, IRON, RETICCTPCT in the last 72 hours.  Coagulation profile No results for input(s): INR, PROTIME in the last 168 hours.  No results for input(s): DDIMER in the last 72 hours.  Cardiac Enzymes  Recent Labs Lab 03/26/15 1109  TROPONINI <0.03   ------------------------------------------------------------------------------------------------------------------ Invalid input(s): POCBNP  No results for input(s): GLUCAP in the last 72 hours.   Kathryn [name] M.D. [facility] 03/27/2015, 11:15 AM  Pager: [phone] Between 7am to 7pm - call Pager - [phone]  After 7pm go to [email or web address] - password [email or web address]  Call night coverage person covering after 7pm

## 2015-03-27 NOTE — Evaluation (Signed)
Occupational Therapy Evaluation Patient Details Name: Kathryn [name] MRN: [id number] DOB: Apr 29, 1950 Today's Date: 03/27/2015    History of Present Illness 64 y.o. female with h/o alcoholic cirrhosis and chronic pancreatitis, PVD, and HTN. She presented to the ER after a sudden onset of abdominal pain and vomiting. Admitted for an episode of acute on chronic pancreatitis and electrolyte abnormalities.   Clinical Impression   Pt admitted with above and presents with deficits listed below.  Pt with history of falls and utilizes w/c for primary mode of mobility in home.  Pt completed LB dressing and stand pivot transfers at Mod I level during eval.  Activity limited secondary to pain.  Pt reports other than her pain she feels that her ability to complete ADLs is at baseline.  Pt will not require follow up OT at this time.  OT will sign off.    Follow Up Recommendations  No OT follow up    Equipment Recommendations  None recommended by OT    Recommendations for Other Services       Precautions / Restrictions Precautions Precautions: Fall Restrictions Weight Bearing Restrictions: No      Mobility Bed Mobility Overal bed mobility: Modified Independent             General bed mobility comments: use of bed rails  Transfers Overall transfer level: Modified independent   Transfers: Sit to/from Stand;Stand Pivot Transfers Sit to Stand: Modified independent (Device/Increase time) Stand pivot transfers: Modified independent (Device/Increase time)                 ADL Overall ADL's : At baseline                     Lower Body Dressing: Modified independent;Sit to/from stand   Toilet Transfer: Modified Independent;Stand-pivot;BSC           Functional mobility during ADLs: Modified independent General ADL Comments: Pt reports use of w/c for majority of mobility in her home due to decreased endurance and multiple falls.  Completed stand pivot transfers mod I  and completed simulated LB dressing with sit > stand and doffing/donning socks without assist     Vision Vision Assessment?: No apparent visual deficits          Pertinent Vitals/Pain Pain Assessment: 0-10 Pain Score: 7  Pain Intervention(s): Limited activity within patient's tolerance;Monitored during session;Premedicated before session     Hand Dominance Right   Extremity/Trunk Assessment Upper Extremity Assessment Upper Extremity Assessment: Overall WFL for tasks assessed   Lower Extremity Assessment Lower Extremity Assessment: Defer to PT evaluation       Communication Communication Communication: No difficulties   Cognition Arousal/Alertness: Awake/alert Behavior During Therapy: WFL for tasks assessed/performed Overall Cognitive Status: Within Functional Limits for tasks assessed                                Home Living Family/patient expects to be discharged to:: Private residence Living Arrangements: Spouse/significant other Available Help at Discharge: Friend(s)   Home Access: Stairs to enter [employer] of Steps: 3 with one rail to back door Entrance Stairs-Rails: Left Home Layout: One level     Bathroom Shower/Tub: Chief Strategy Officer: Handicapped height Bathroom Accessibility: Yes   Home Equipment: Environmental consultant - 4 wheels;Cane - single point;Shower seat;Toilet riser;Grab bars - tub/shower;Wheelchair - manual;Wheelchair - power          Prior Functioning/Environment  Level of Independence: Independent with assistive device(s)             OT Diagnosis: Acute pain   OT Problem List: Decreased activity tolerance;Pain      OT Goals(Current goals can be found in the care plan section) Acute Rehab OT Goals OT Goal Formulation: All assessment and education complete, DC therapy  OT Frequency:      End of Session Nurse Communication: Mobility status  Activity Tolerance: Patient tolerated treatment well;No  increased pain Patient left: in bed;with bed alarm set   Time: [id number] OT Time Calculation (min): 26 min Charges:  OT General Charges $OT Visit: 1 Procedure OT Evaluation $Initial OT Evaluation Tier I: 1 Procedure OT Treatments $Self Care/Home Management : 8-22 mins G-CodesRosalio Loud:    Merick Kelleher, [phone] 03/27/2015, 9:24 AM

## 2015-03-27 NOTE — Evaluation (Signed)
Physical Therapy Evaluation/Discharge Patient Details Name: Kathryn [name] MRN: [id number] DOB: April 22, 1950 Today's Date: 03/27/2015   History of Present Illness  64 y.o. female with h/o alcoholic cirrhosis and chronic pancreatitis, PVD, and HTN. She presented to the ER after a sudden onset of abdominal pain and vomiting. Admitted for an episode of acute on chronic pancreatitis and electrolyte abnormalities. 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 She was able to perform transfers and ambulation without any difficulties. Considering her current functional mobility level, goals, and caregiver support, no further acute PT is indicated at this time. When medically stable, D/C to home would be appropriate. Pt stated that she is interested in receiving HHPT upon D/C to improve capacity for maintaining household.    Follow Up Recommendations Home health PT    Equipment Recommendations  None recommended by PT    Recommendations for Other Services       Precautions / Restrictions Precautions Precautions: Fall Restrictions Weight Bearing Restrictions: No      Mobility  Bed Mobility Overal bed mobility: Modified Independent             General bed mobility comments: use of bed rails, HOB elevated  Transfers Overall transfer level: Modified independent Equipment used: None Transfers: Sit to/from Stand Sit to Stand: Supervision Stand pivot transfers: Modified independent (Device/Increase time)       General transfer comment: Supervision for safety, no physical assistance needed  Ambulation/Gait Ambulation/Gait assistance: Supervision Ambulation Distance (Feet): 250 Feet Assistive device: Rolling walker (2 wheeled);None Gait Pattern/deviations: WFL(Within Functional Limits);Step-through pattern   Gait velocity interpretation: at or above normal speed for  age/gender General Gait Details: Supervision for safety. Pt had RW positioned far in front throughout gait. Walked in room without RW with no difficulties  Information systems manager Rankin (Stroke Patients Only)       Balance Overall balance assessment: Needs assistance   Sitting balance-Leahy Scale: Normal     Standing balance support: No upper extremity supported Standing balance-Leahy Scale: Good Standing balance comment: Able to remain stable when static and walking. Balance may be impaired due to PMHx of L transmetatarsal amputation                             Pertinent Vitals/Pain      Home Living Family/patient expects to be discharged to:: Private residence Living Arrangements: Spouse/significant other Available Help at Discharge: Friend(s) Type of Home: House Home Access: Stairs to enter Entrance Stairs-Rails: Left Entrance Stairs-Number of Steps: 5 in front no rail, 3 in back with rail Home Layout: One level Home Equipment: Walker - 4 wheels;Cane - single point;Shower seat;Toilet riser;Grab bars - tub/shower;Wheelchair - Engineer, technical sales - power      Prior Function Level of Independence: Independent with assistive device(s)         Comments: Able to walk 300' with no AD PTA, did use wheelchair for convenience     Hand Dominance   Dominant Hand: Right    Extremity/Trunk Assessment   Upper Extremity Assessment: Overall WFL for tasks assessed           Lower Extremity Assessment: LLE deficits/detail   LLE Deficits / Details: Decreased balance and pt reported decreased vascularity in L LE due to PMHx  Cervical / Trunk Assessment: Normal  Communication   Communication: No difficulties  Cognition Arousal/Alertness: Awake/alert Behavior During Therapy: WFL for tasks assessed/performed Overall Cognitive Status: Within Functional Limits for tasks assessed                      General Comments       Exercises        Assessment/Plan    PT Assessment Patent does not need any further PT services  PT Diagnosis Difficulty walking;Generalized weakness   PT Problem List    PT Treatment Interventions     PT Goals (Current goals can be found in the Care Plan section) Acute Rehab PT Goals Patient Stated Goal: Return home PT Goal Formulation: With patient Time For Goal Achievement: 04/03/15 Potential to Achieve Goals: Good    Frequency     Barriers to discharge        Co-evaluation               End of Session Equipment Utilized During Treatment: Gait belt Activity Tolerance: Patient tolerated treatment well Patient left: in chair;with call bell/phone within reach;with chair alarm set           Time: [id number] PT Time Calculation (min) (ACUTE ONLY): 25 min   Charges:   PT Evaluation $Initial PT Evaluation Tier I: 1 Procedure PT Treatments $Gait Training: 8-22 mins   PT G CodesJimmy Picket:        Clarece Drzewiecki 03/27/2015, 1:18 PM Jimmy PicketJustin Felicia Bloomquist, SPT 03/27/2015 1:18 PM

## 2015-03-27 NOTE — Consult Note (Signed)
Tenny Craw 01-12-1951  [id number].   Primary Care MD: Dr. Donnie Coffin Requesting MD: Dr. Estill Cotta Chief Complaint/Reason for Consult: acute cholecystitis HPI: This is a 64 yo white female with a history of cirrhosis from ETOH abuse, chronic pancreatitis, MI, HTN, PVD, who was noted to have a possible cystic lesion on her pancreas in early November. She was referred to Dr. Paulita Fujita who performed an EUS and found most changes to be c/w chronic pancreatitis.  Follow up MRI and Ca19-9, etc in 3-6 months.  After this procedure, she then developed epigastric abdominal pain and left upper quadrant pain along with some nausea and vomiting.  She presented to [facility] yesterday where she was admitted for essentially acute on chronic pancreatitis.  She did have a CT scan that showed no inflammation of her pancreas, possible changes c/w cholecystitis, but she has cirrhosis of her liver as well.  We have been asked to see her for these findings.  ROS : Please see HPI, otherwise negative  History reviewed. No pertinent family history.  Past Medical History  Diagnosis Date  . Osteoporosis   . Alcoholic liver disease ([place])   . High cholesterol   . Ascites 09/22/11    "first time for , now resolved  . Pancreatitis several years ago  . History of blood transfusion 07/2011    "when I had elbow surgery"  . GERD (gastroesophageal reflux disease)   . Staph infection     left foot  . Fracture, humerus 07/2011    right  . Dysrhythmia     "irregular"  . Arthritis     "all over"  . Peripheral vascular disease ([place])   . Hypertension     not current  . Osteomyelitis ([place])     left foot  . Myocardial infarction [facility])     NSTEMI 12/2011 in setting of septic shock    Past Surgical History  Procedure Laterality Date  . Amputation      two toes on left foot  . Bunionectomy      both feet  . Arthroscopic repair acl      right knee  . Orif humerus fracture  07/26/2011    Procedure: OPEN REDUCTION INTERNAL  FIXATION (ORIF) DISTAL HUMERUS FRACTURE;  Surgeon: Rozanna Box, MD;  Location: [place];  Service: Orthopedics;  Laterality: Right;  . Total hip arthroplasty  ~ 2003    right  . Tubal ligation  1980  . Paracentesis  09/22/11    2.2L  . Esophagogastroduodenoscopy  09/23/2011    Procedure: ESOPHAGOGASTRODUODENOSCOPY (EGD);  Surgeon: Lear Ng, MD;  Location: [facility] ENDOSCOPY;  Service: Endoscopy;  Laterality: N/A;  . Transmetatarsal amputation  02/16/2012    Procedure: TRANSMETATARSAL AMPUTATION;  Surgeon: Newt Minion, MD;  Location: [place];  Service: Orthopedics;  Laterality: Left;  . Fracture surgery  2013    Right elbow  . Joint replacement      right hip   . Esophagogastroduodenoscopy (egd) with propofol N/A 08/22/2012    Procedure: ESOPHAGOGASTRODUODENOSCOPY (EGD) WITH PROPOFOL;  Surgeon: Arta Silence, MD;  Location: [facility] ENDOSCOPY;  Service: Endoscopy;  Laterality: N/A;  . Leg surgery Left     Correction of unequal leg length  . Orif wrist fracture Left 12/26/2014    Procedure: OPEN REDUCTION INTERNAL FIXATION (ORIF) WRIST FRACTURE;  Surgeon: Meredith Pel, MD;  Location: [place];  Service: Orthopedics;  Laterality: Left;  . Eus N/A 03/25/2015    Procedure: UPPER ENDOSCOPIC ULTRASOUND (EUS)  RADIAL;  Surgeon: Arta Silence, MD;  Location: [facility] ENDOSCOPY;  Service: Endoscopy;  Laterality: N/A;  . Fine needle aspiration N/A 03/25/2015    Procedure: FINE NEEDLE ASPIRATION (FNA) RADIAL;  Surgeon: Arta Silence, MD;  Location: [facility] ENDOSCOPY;  Service: Endoscopy;  Laterality: N/A;    Social History:  reports that she has never smoked. She has never used smokeless tobacco. She reports that she does not drink alcohol or use illicit drugs.  Allergies:  Allergies  Allergen Reactions  . Codeine Hives    Causes blisters  . Tramadol Other (See Comments)    Loss of consciousness     Medications Prior to Admission  Medication Sig Dispense Refill  . alendronate (FOSAMAX) 70 MG tablet Take  70 mg by mouth once a week. Sundays  12  . allopurinol (ZYLOPRIM) 100 MG tablet Take 100 mg by mouth 2 (two) times daily.    . colchicine 0.6 MG tablet Take 0.6 mg by mouth 2 (two) times daily as needed (gout).    . [employer] CALCIUM CITRATE +D3 MINI 200-250 MG-UNIT TABS Take 1 tablet by mouth 2 (two) times daily with a meal.  11  . folic acid (FOLVITE) 1 MG tablet Take 1 tablet (1 mg total) by mouth daily. 30 tablet 0  . furosemide (LASIX) 40 MG tablet Take 1 tablet (40 mg total) by mouth daily. 60 tablet 2  . gabapentin (NEURONTIN) 100 MG capsule Take 200 mg by mouth 3 (three) times daily as needed. Nerve pain  0  . HYDROmorphone (DILAUDID) 2 MG tablet Take 2 mg by mouth every 4 (four) hours as needed for severe pain.    . mirtazapine (REMERON) 15 MG tablet Take 1 tablet (15 mg total) by mouth at bedtime. 30 tablet 0  . omeprazole (PRILOSEC) 40 MG capsule Take 40 mg by mouth daily.    . propranolol (INDERAL) 10 MG tablet Take 1 tablet (10 mg total) by mouth 2 (two) times daily. 60 tablet 0  . spironolactone (ALDACTONE) 50 MG tablet Take 50 mg by mouth daily.    Marland Kitchen thiamine (VITAMIN B-1) 100 MG tablet Take 1 tablet (100 mg total) by mouth daily. 30 tablet 0    Blood pressure 113/77, pulse 92, temperature 98.7 F (37.1 C), temperature source Oral, resp. rate 18, height 4' 11.5" (1.511 m), weight 54.25 kg (119 lb 9.6 oz), SpO2 92 %. Physical Exam: General: WD, WN white female who is laying in bed in NAD HEENT: head is normocephalic, atraumatic.  Sclera are noninjected.  PERRL.  Ears and nose without any masses or lesions.  Mouth is pink and moist Heart: regular, rate, and rhythm.  Normal s1,s2. No obvious murmurs, gallops, or rubs noted.  Palpable radial and pedal pulses bilaterally. Shock pads in place on her chest Lungs: CTAB, no wheezes, rhonchi, or rales noted.  Respiratory effort nonlabored Abd: soft, tender in epigastrium and LUQ, really no RUQ tenderness, ND, +BS, no masses, hernias, or  organomegaly MS: all 4 extremities are symmetrical with no cyanosis, clubbing, or edema. Skin: warm and dry with no masses, lesions, or rashes Psych: A&Ox3 with an appropriate affect.    Results for orders placed or performed during the hospital encounter of 03/26/15 (from the past 48 hour(s))  Urinalysis, Routine w reflex microscopic (not at [facility])     Status: Abnormal   Collection Time: 03/26/15 11:01 AM  Result Value Ref Range   Color, Urine AMBER (A) YELLOW    Comment: BIOCHEMICALS MAY BE AFFECTED BY  COLOR   APPearance CLOUDY (A) CLEAR   Specific Gravity, Urine 1.021 1.005 - 1.030   pH 6.0 5.0 - 8.0   Glucose, UA NEGATIVE NEGATIVE mg/dL   Hgb urine dipstick NEGATIVE NEGATIVE   Bilirubin Urine MODERATE (A) NEGATIVE   Ketones, ur 15 (A) NEGATIVE mg/dL   Protein, ur NEGATIVE NEGATIVE mg/dL   Nitrite NEGATIVE NEGATIVE   Leukocytes, UA SMALL (A) NEGATIVE  Urine microscopic-add on     Status: Abnormal   Collection Time: 03/26/15 11:01 AM  Result Value Ref Range   Squamous Epithelial / LPF 0-5 (A) NONE SEEN   WBC, UA 0-5 0 - 5 WBC/hpf   RBC / HPF 0-5 0 - 5 RBC/hpf   Bacteria, UA RARE (A) NONE SEEN  Lipase, blood     Status: None   Collection Time: 03/26/15 11:09 AM  Result Value Ref Range   Lipase 42 11 - 51 U/L  Comprehensive metabolic panel     Status: Abnormal   Collection Time: 03/26/15 11:09 AM  Result Value Ref Range   Sodium 143 135 - 145 mmol/L   Potassium 2.8 (L) 3.5 - 5.1 mmol/L   Chloride 105 101 - 111 mmol/L   CO2 27 22 - 32 mmol/L   Glucose, Bld 135 (H) 65 - 99 mg/dL   BUN 7 6 - 20 mg/dL   Creatinine, Ser 0.77 0.44 - 1.00 mg/dL   Calcium 9.0 8.9 - 10.3 mg/dL   Total Protein 6.5 6.5 - 8.1 g/dL   Albumin 2.9 (L) 3.5 - 5.0 g/dL   AST 134 (H) 15 - 41 U/L   ALT 30 14 - 54 U/L   Alkaline Phosphatase 178 (H) 38 - 126 U/L   Total Bilirubin 1.3 (H) 0.3 - 1.2 mg/dL   GFR calc non Af Amer >60 >60 mL/min   GFR calc Af Amer >60 >60 mL/min    Comment: (NOTE) The eGFR  has been calculated using the CKD EPI equation. This calculation has not been validated in all clinical situations. eGFR's persistently <60 mL/min signify possible Chronic Kidney Disease.    Anion gap 11 5 - 15  CBC     Status: Abnormal   Collection Time: 03/26/15 11:09 AM  Result Value Ref Range   WBC 5.3 4.0 - 10.5 K/uL   RBC 3.77 (L) 3.87 - 5.11 MIL/uL   Hemoglobin 11.8 (L) 12.0 - 15.0 g/dL   HCT 36.0 36.0 - 46.0 %   MCV 95.5 78.0 - 100.0 fL   MCH 31.3 26.0 - 34.0 pg   MCHC 32.8 30.0 - 36.0 g/dL   RDW 13.7 11.5 - 15.5 %   Platelets 158 150 - 400 K/uL  Troponin I     Status: None   Collection Time: 03/26/15 11:09 AM  Result Value Ref Range   Troponin I <0.03 <0.031 ng/mL    Comment:        NO INDICATION OF MYOCARDIAL INJURY.   Magnesium     Status: Abnormal   Collection Time: 03/26/15 11:09 AM  Result Value Ref Range   Magnesium 0.8 (LL) 1.7 - 2.4 mg/dL    Comment: CRITICAL RESULT CALLED TO, READ BACK BY AND VERIFIED WITH: HALEY SHARP,RN AT 1420 03/26/15 BY ZBEECH.   Amylase     Status: None   Collection Time: 03/26/15 11:09 AM  Result Value Ref Range   Amylase 95 28 - 100 U/L  Lactic acid, plasma     Status: Abnormal   Collection Time: 03/26/15  3:13 PM  Result Value Ref Range   Lactic Acid, Venous 3.5 (HH) 0.5 - 2.0 mmol/L    Comment: CRITICAL RESULT CALLED TO, READ BACK BY AND VERIFIED WITH: K DUFFY,RN 1552 03/26/15 D BRADLEY   Basic metabolic panel     Status: Abnormal   Collection Time: 03/26/15  3:13 PM  Result Value Ref Range   Sodium 142 135 - 145 mmol/L   Potassium 3.5 3.5 - 5.1 mmol/L    Comment: NO VISIBLE HEMOLYSIS   Chloride 103 101 - 111 mmol/L   CO2 29 22 - 32 mmol/L   Glucose, Bld 116 (H) 65 - 99 mg/dL   BUN 8 6 - 20 mg/dL   Creatinine, Ser 0.81 0.44 - 1.00 mg/dL   Calcium 8.9 8.9 - 10.3 mg/dL   GFR calc non Af Amer >60 >60 mL/min   GFR calc Af Amer >60 >60 mL/min    Comment: (NOTE) The eGFR has been calculated using the CKD EPI  equation. This calculation has not been validated in all clinical situations. eGFR's persistently <60 mL/min signify possible Chronic Kidney Disease.    Anion gap 10 5 - 15  Magnesium     Status: None   Collection Time: 03/26/15  3:13 PM  Result Value Ref Range   Magnesium 1.9 1.7 - 2.4 mg/dL  Lactic acid, plasma     Status: Abnormal   Collection Time: 03/26/15  4:48 PM  Result Value Ref Range   Lactic Acid, Venous 3.8 (HH) 0.5 - 2.0 mmol/L    Comment: CRITICAL RESULT CALLED TO, READ BACK BY AND VERIFIED WITH: K DUFFY,RN 1807 03/26/15 D BRADLEY   Lactic acid, plasma     Status: Abnormal   Collection Time: 03/26/15  7:08 PM  Result Value Ref Range   Lactic Acid, Venous 2.4 (HH) 0.5 - 2.0 mmol/L    Comment: CRITICAL RESULT CALLED TO, READ BACK BY AND VERIFIED WITH: C BEAIRD,RN 1953 03/26/15 D BRADLEY   Procalcitonin - Baseline     Status: None   Collection Time: 03/26/15  7:08 PM  Result Value Ref Range   Procalcitonin <0.10 ng/mL    Comment:        Interpretation: PCT (Procalcitonin) <= 0.5 ng/mL: Systemic infection (sepsis) is not likely. Local bacterial infection is possible. (NOTE)         ICU PCT Algorithm               Non ICU PCT Algorithm    ----------------------------     ------------------------------         PCT < 0.25 ng/mL                 PCT < 0.1 ng/mL     Stopping of antibiotics            Stopping of antibiotics       strongly encouraged.               strongly encouraged.    ----------------------------     ------------------------------       PCT level decrease by               PCT < 0.25 ng/mL       >= 80% from peak PCT       OR PCT 0.25 - 0.5 ng/mL          Stopping of antibiotics  encouraged.     Stopping of antibiotics           encouraged.    ----------------------------     ------------------------------       PCT level decrease by              PCT >= 0.25 ng/mL       < 80% from peak PCT        AND PCT  >= 0.5 ng/mL            Continuin g antibiotics                                              encouraged.       Continuing antibiotics            encouraged.    ----------------------------     ------------------------------     PCT level increase compared          PCT > 0.5 ng/mL         with peak PCT AND          PCT >= 0.5 ng/mL             Escalation of antibiotics                                          strongly encouraged.      Escalation of antibiotics        strongly encouraged.   Comprehensive metabolic panel     Status: Abnormal   Collection Time: 03/27/15  5:12 AM  Result Value Ref Range   Sodium 135 135 - 145 mmol/L   Potassium 4.1 3.5 - 5.1 mmol/L   Chloride 103 101 - 111 mmol/L   CO2 25 22 - 32 mmol/L   Glucose, Bld 112 (H) 65 - 99 mg/dL   BUN <5 (L) 6 - 20 mg/dL   Creatinine, Ser 0.78 0.44 - 1.00 mg/dL   Calcium 7.6 (L) 8.9 - 10.3 mg/dL   Total Protein 5.0 (L) 6.5 - 8.1 g/dL   Albumin 2.4 (L) 3.5 - 5.0 g/dL   AST 128 (H) 15 - 41 U/L   ALT 46 14 - 54 U/L   Alkaline Phosphatase 170 (H) 38 - 126 U/L   Total Bilirubin 4.3 (H) 0.3 - 1.2 mg/dL   GFR calc non Af Amer >60 >60 mL/min   GFR calc Af Amer >60 >60 mL/min    Comment: (NOTE) The eGFR has been calculated using the CKD EPI equation. This calculation has not been validated in all clinical situations. eGFR's persistently <60 mL/min signify possible Chronic Kidney Disease.    Anion gap 7 5 - 15  CBC     Status: Abnormal   Collection Time: 03/27/15  5:12 AM  Result Value Ref Range   WBC 6.0 4.0 - 10.5 K/uL   RBC 3.39 (L) 3.87 - 5.11 MIL/uL   Hemoglobin 10.9 (L) 12.0 - 15.0 g/dL   HCT 33.2 (L) 36.0 - 46.0 %   MCV 97.9 78.0 - 100.0 fL   MCH 32.2 26.0 - 34.0 pg   MCHC 32.8 30.0 - 36.0 g/dL   RDW 14.0 11.5 - 15.5 %   Platelets 166 150 - 400 K/uL   Ct Abdomen  Pelvis W Contrast  03/26/2015  CLINICAL DATA:  Left upper quadrant abdominal pain and tenderness with nausea and vomiting. History of alcoholic liver  disease, pancreatitis and colitis. EXAM: CT ABDOMEN AND PELVIS WITH CONTRAST TECHNIQUE: Multidetector CT imaging of the abdomen and pelvis was performed using the standard protocol following bolus administration of intravenous contrast. CONTRAST:  152m OMNIPAQUE IOHEXOL 300 MG/ML  SOLN COMPARISON:  Abdominal pelvic CT 01/02/2012. Abdominal MRI 02/26/2015. FINDINGS: Lower chest: Trace pleural effusions with mild bibasilar atelectasis, improved from previous CT. Small hiatal hernia. Hepatobiliary: The liver has a stable appearance with diffuse steatosis, relative enlargement of the left lobe and scarring and fibrosis throughout the right lobe. No focal hepatic lesions are identified. There are several small calcified gallstones. There is new gallbladder distention with wall thickening and surrounding inflammation worrisome for acute cholecystitis. There is mild prominence of the extrahepatic biliary system which measures up to 8 mm in diameter. No intraductal calculi seen. Pancreas: There are multiple parenchymal calcifications throughout the pancreas with associated atrophy consistent with chronic calcific pancreatitis. There is no evidence of pancreatic mass, significant ductal dilatation or surrounding inflammation. Spleen: Normal in size without focal abnormality. Adrenals/Urinary Tract: Both adrenal glands appear normal. The kidneys appear stable without focal abnormality, hydronephrosis or nephrolithiasis. The bladder is nearly empty and suboptimally evaluated. Stomach/Bowel: No evidence of bowel wall thickening, distention or surrounding inflammatory change. Vascular/Lymphatic: There are no enlarged abdominal or pelvic lymph nodes. Mild aortoiliac atherosclerosis. Reproductive: Stable. There is a left uterine fundal fibroid. There are mild gonadal vein varices on the left. Other: No ascites or peritoneal nodularity. Small umbilical hernia containing only fat. Musculoskeletal: Right total hip arthroplasty and  posttraumatic deformities within the left pubic bones are noted. There is chronic left femoral head avascular necrosis. There is a moderate convex left lumbar scoliosis with associated spondylosis. There is chronic atrophy of the right iliopsoas musculature. IMPRESSION: 1. New gallbladder distention, wall thickening and surrounding inflammatory changes worrisome for acute cholecystitis. 2. Calcific pancreatitis without evidence of acute inflammation or significant ductal dilatation. 3. Stable cirrhotic changes within the liver with fibrosis in the right lobe. 4. Stable osseous findings, including the spondylosis and chronic left femoral head avascular necrosis. Electronically Signed   By: WRichardean SaleM.D.   On: 03/26/2015 21:30   Dg Chest Port 1 View  03/26/2015  CLINICAL DATA:  Myocardial infarction.  Ventricular tachycardia EXAM: PORTABLE CHEST 1 VIEW COMPARISON:  09/29/2014 FINDINGS: Normal heart size and stable aortic contours. There is mild atelectatic type opacity at the medial right base. No edema, effusion, or consolidation. Remote left sixth rib fracture. No acute osseous finding. IMPRESSION: Mild right basilar atelectasis. Electronically Signed   By: JMonte FantasiaM.D.   On: 03/26/2015 11:13   Dg Abd 2 Views  03/26/2015  CLINICAL DATA:  Nausea and vomiting EXAM: ABDOMEN - 2 VIEW COMPARISON:  None. FINDINGS: Scattered large and small bowel gas is noted. Degenerative changes of the lumbar spine are seen with a scoliosis concave to the right. Postsurgical changes in the right hip are noted. Prior fractures in the left pubic rami are seen. No free air is noted. No acute abnormality noted. IMPRESSION: No acute abnormality seen. Electronically Signed   By: MInez CatalinaM.D.   On: 03/26/2015 14:04       Assessment/Plan 1. Abdominal pain, likely chronic pancreatic pain -the patient does have some gallstones noted on her CT and possible findings of cholecystitis.  However, the patient is at least  a Child-Pugh B cirrhotic which can cause false findings that make the gallbladder appear to be acutely inflamed when it really is from cirrhosis.  However, to further evaluate, the patient will get a HIDA scan to determine if she has acute cholecystitis or not.  If she does have a positive HIDA, then give her increased risk of morbidity and mortality with an intra-abdominal operation around at least 30%, we would consider conservative management with just abx therapy, or a d/w IR to see if they would consider a perc chole drain.  We will follow along and await HIDA results.  Jayan Raymundo E 03/27/2015, 11:08 AM Pager: [phone]

## 2015-03-28 LAB — CBC
HCT: 34.4 % — ABNORMAL LOW (ref 36.0–46.0)
HEMOGLOBIN: 11.1 g/dL — AB (ref 12.0–15.0)
MCH: 31.7 pg (ref 26.0–34.0)
MCHC: 32.3 g/dL (ref 30.0–36.0)
MCV: 98.3 fL (ref 78.0–100.0)
PLATELETS: 144 10*3/uL — AB (ref 150–400)
RBC: 3.5 MIL/uL — AB (ref 3.87–5.11)
RDW: 14.1 % (ref 11.5–15.5)
WBC: 5.6 10*3/uL (ref 4.0–10.5)

## 2015-03-28 LAB — PROCALCITONIN: Procalcitonin: 0.26 ng/mL

## 2015-03-28 LAB — COMPREHENSIVE METABOLIC PANEL
ALBUMIN: 2.3 g/dL — AB (ref 3.5–5.0)
ALT: 37 U/L (ref 14–54)
ANION GAP: 9 (ref 5–15)
AST: 65 U/L — ABNORMAL HIGH (ref 15–41)
Alkaline Phosphatase: 159 U/L — ABNORMAL HIGH (ref 38–126)
BUN: <5 mg/dL — ABNORMAL LOW (ref 6–20)
CALCIUM: 7.8 mg/dL — AB (ref 8.9–10.3)
CO2: 22 mmol/L (ref 22–32)
CREATININE: 0.78 mg/dL (ref 0.44–1.00)
Chloride: 105 mmol/L (ref 101–111)
GFR calc Af Amer: >60 mL/min (ref >60)
GFR calc non Af Amer: >60 mL/min (ref >60)
GLUCOSE: 86 mg/dL (ref 65–99)
POTASSIUM: 4.3 mmol/L (ref 3.5–5.1)
SODIUM: 136 mmol/L (ref 135–145)
TOTAL PROTEIN: 5.1 g/dL — AB (ref 6.5–8.1)
Total Bilirubin: 5.7 mg/dL — ABNORMAL HIGH (ref 0.3–1.2)

## 2015-03-28 NOTE — Progress Notes (Signed)
  Subjective: Less abdominal pain  Objective: Vital signs in last 24 hours: Temp:  [98.1 F (36.7 C)-100.1 F (37.8 C)] 98.8 F (37.1 C) (12/10 0536) Pulse Rate:  [68-87] 79 (12/10 0826) Resp:  [18] 18 (12/10 0536) BP: (96-111)/(57-77) 96/73 mmHg (12/10 0826) SpO2:  [92 %-97 %] 92 % (12/10 0536) Last BM Date: 03/26/15  Intake/Output from previous day: 12/09 0701 - 12/10 0700 In: 2741.7 [I.V.:2591.7; IV Piggyback:150] Out: 300 [Urine:300] Intake/Output this shift:    General appearance: alert and cooperative Resp: clear to auscultation bilaterally GI: soft, minimal tenderness RUQ  Lab Results:   Recent Labs  03/27/15 0512 03/28/15 0503  WBC 6.0 5.6  HGB 10.9* 11.1*  HCT 33.2* 34.4*  PLT 166 144*   BMET  Recent Labs  03/27/15 0512 03/28/15 0503  NA 135 136  K 4.1 4.3  CL 103 105  CO2 25 22  GLUCOSE 112* 86  BUN <5* <5*  CREATININE 0.78 0.78  CALCIUM 7.6* 7.8*    Anti-infectives: Anti-infectives    Start     Dose/Rate Route Frequency Ordered Stop   03/27/15 0830  piperacillin-tazobactam (ZOSYN) IVPB 3.375 g     3.375 g 12.5 mL/hr over 240 Minutes Intravenous Every 8 hours 03/27/15 0826        Assessment/Plan: Cholecystitis - high operative risk with cirrhosis, Zosyn and sips clears for now. We will follow closely. Seems a bit better today.  LOS: 2 days    Nameer Summer E 03/28/2015

## 2015-03-28 NOTE — Progress Notes (Signed)
[facility]                                                                              Patient Demographics  Kathryn [name], is a 64 y.o. female, DOB - Jan 03, 1951, QIO:[id number]  Admit date - 03/26/2015   Admitting Physician [name], MD  Outpatient Primary MD for the patient is [name], MD  LOS - 2   Chief Complaint  Patient presents with  . Pancreatitis       Brief HPI   Patient is a 64 year old female with prior history of alcohol use, cirrhosis, chronic pancreatitis, PVD, hypertension.  She presented to the ER after a sudden onset of abdominal pain and vomiting on the morning of admission. Patient had an EUS (without biopsy) on 12/7 by Dr. Dulce Sellarutlaw. Her bile duct was not cannulated during the procedure. She felt well immediately after the procedure however on the morning of admission she woke up at 5 AM with vomiting and pain. In the emergency department her potassium was 2.8 and mag was 0.8. Total bili was 1/2 and AST 134. Amylase 95, Lipase 42. Xrays of her chest and abdomen were negative. EKG shows sinus rhythm with a prolonged QTc. Patient was admitted for acute on chronic pancreatitis.  Assessment & Plan    Principal Problem:  Acute exacerbation of chronic pancreatitis - CT scan of the abdomen and pelvis reviewed, shows gallbladder wall edema with acute cholecystitis.  - HIDA scan shows hepatic dysfunction or partial CBD obstruction of both, gallbladder not definitely identified suggesting cystic duct obstruction. -  General surgery following, recommending clear liquid diet and continue IV antibiotics  Active problems  Lactic acidosis with dehydration  - Continue IV fluid hydration, lactic acid improving    Palpitations: Patient had a short run of V. tach (7 beats) likely due to electrolyte abnormalities on admission. - BMET stable, keep K ~4, MG >2  Alcoholic liver disease with possible pancreatic duct obstruction/lesion MRI  performed in November 2016 showed possible pancreatic lesion. EUS performed yesterday by Dr. Dulce Sellarutlaw. Continue outpatient management.  Code Status: Full CODE STATUS   Family Communication: Discussed in detail with the patient, all imaging results, lab results explained to the patient    Disposition Plan:   Time Spent in minutes  25 minutes  Procedures  CT abd HIDA scan  Consults   GI Surgery  DVT Prophylaxis heparin subcutaneous   Medications  Scheduled Meds: . allopurinol  100 mg Oral BID  . heparin  5,000 Units Subcutaneous 3 times per day  . pantoprazole (PROTONIX) IV  40 mg Intravenous Q12H  . piperacillin-tazobactam (ZOSYN)  IV  3.375 g Intravenous Q8H  . propranolol  10 mg Oral BID  . sodium chloride  3 mL Intravenous Q12H   Continuous Infusions: . sodium chloride 125 mL/hr at 03/28/15 0036   PRN Meds:.acetaminophen **OR** acetaminophen, HYDROmorphone (DILAUDID) injection, polyethylene glycol   Antibiotics   Anti-infectives    Start     Dose/Rate Route Frequency Ordered Stop   03/27/15 0830  piperacillin-tazobactam (ZOSYN) IVPB 3.375 g     3.375 g 12.5 mL/hr over 240 Minutes Intravenous Every  8 hours 03/27/15 0826          Subjective:   [name] was seen and examined today. Feeling a little better today, abdominal pain is improving, no nausea or vomiting or fevers or chills. Patient denies dizziness, chest pain, shortness of breath, new weakness, numbess, tingling. No acute events overnight.    Objective:   Blood pressure 96/73, pulse 79, temperature 98.8 F (37.1 C), temperature source Oral, resp. rate 18, height 4' 11.5" (1.511 m), weight 54.25 kg (119 lb 9.6 oz), SpO2 92 %.  Wt Readings from Last 3 Encounters:  03/26/15 54.25 kg (119 lb 9.6 oz)  12/26/14 49.442 kg (109 lb)  12/25/14 49.442 kg (109 lb)     Intake/Output Summary (Last 24 hours) at 03/28/15 1106 Last data filed at 03/28/15 0947  Gross per 24 hour  Intake 2691.67 ml  Output     500 ml  Net 2191.67 ml    Exam  General: Alert and oriented x 3, NAD  HEENT:  PERRLA, EOMI, Anicteric Sclera, mucous membranes moist.   Neck: Supple, no JVD, no masses  CVS: S1 S2 auscultated, no rubs, murmurs or gallops. Regular rate and rhythm.  Respiratory: CTAB  Abdomen: Soft, minimal tenderness in RUQ  nondistended, + bowel sounds  Ext: no cyanosis clubbing or edema  Neuro: no new deficits  Skin: No rashes  Psych: Normal affect and demeanor, alert and oriented x3    Data Review   Micro Results No results found for this or any previous visit (from the past 240 hour(s)).  Radiology Reports Mr Abdomen W Wo Contrast  02/26/2015  CLINICAL DATA:  Evaluate cystic lesion within the pancreas. EXAM: MRI ABDOMEN WITHOUT AND WITH CONTRAST TECHNIQUE: Multiplanar multisequence MR imaging of the abdomen was performed both before and after the administration of intravenous contrast. CONTRAST:  9mL MULTIHANCE GADOBENATE DIMEGLUMINE 529 MG/ML IV SOLN COMPARISON:  08/22/2014 FINDINGS: Lower chest: No significant pleural fluid identified. No pericardial fluid identified. Hepatobiliary: Stones identified within the gallbladder fundus. No gallbladder wall thickening. No biliary dilatation. The liver appears cirrhotic. There is atrophy and fibrosis of the right hepatic lobe. There is diffuse hepatic steatosis identified. Pancreas: There is increase caliber of the pancreatic duct within the body and tail of pancreas. This measures up to 3 mm and there is an abrupt cut off of the prominent pancreatic duct at the junction between the neck and body of the pancreas. New from previous exam is mild soft tissue stranding surrounding the head and tail of pancreas which appears to extend around the splenic artery as it originates from the cystic duct. The small cystic area within tail of pancreas is again identified measuring 1 cm, image 17 of series 11. Spleen: Negative Adrenals/Urinary Tract: Normal  appearance of the adrenal glands. Unremarkable appearance of the kidneys. Stomach/Bowel: The stomach is normal. The visualized small and large bowel loops are unremarkable. Vascular/Lymphatic: Normal appearance of the abdominal aorta. Insert nodes Other: No free fluid or fluid collections within the upper abdomen. Musculoskeletal: Normal signal identified within the bone marrow. There is a scoliosis deformity involving the lumbar spine which is convex towards the left. IMPRESSION: 1. Spectrum of findings on today's exam are suspicious for underlying pancreatic lesion. There is focal dilatation of the pancreatic duct within the body and tail tail with abrupt cut off at the head/neck junction. Mild soft tissue stranding surrounding the pancreas noted within this area. Further investigation with endoscopic ultrasound and possible biopsy is recommended to rule out  the possibility of underlying pancreatic lesion including adenocarcinoma. 2. Cirrhosis. Electronically Signed   By: Signa Kell M.D.   On: 02/26/2015 13:52   Nm Hepatobiliary Liver Func  03/27/2015  CLINICAL DATA:  Cholecystitis.  Elevated bilirubin. EXAM: NUCLEAR MEDICINE HEPATOBILIARY IMAGING TECHNIQUE: Sequential images of the abdomen were obtained out to 60 minutes following intravenous administration of radiopharmaceutical. RADIOPHARMACEUTICALS:  5.25 mCi Tc-30m  Choletec IV COMPARISON:  CT scan 03/26/2015 FINDINGS: At 1 hour no activity was seen in the biliary tree or bowel. At 2 hours there is some faint activity seen in the bowel but no definite biliary activity. Probable faint activity in the duodenum. I do not see any convincing gallbladder activity at 2 hours. IMPRESSION: Minimal excretion of the radiopharmaceutical is identified from the liver. This could be due to hepatic dysfunction or partial common bile duct obstruction or both. Gallbladder is not definitely identified suggesting cystic duct obstruction. Electronically Signed   By: Rudie Meyer M.D.   On: 03/27/2015 17:08   Ct Abdomen Pelvis W Contrast  03/26/2015  CLINICAL DATA:  Left upper quadrant abdominal pain and tenderness with nausea and vomiting. History of alcoholic liver disease, pancreatitis and colitis. EXAM: CT ABDOMEN AND PELVIS WITH CONTRAST TECHNIQUE: Multidetector CT imaging of the abdomen and pelvis was performed using the standard protocol following bolus administration of intravenous contrast. CONTRAST:  OMNIPAQUE IOHEXOL 300 MG/ML  SOLN COMPARISON:  Abdominal pelvic CT 01/02/2012. Abdominal MRI 02/26/2015. FINDINGS: Lower chest: Trace pleural effusions with mild bibasilar atelectasis, improved from previous CT. Small hiatal hernia. Hepatobiliary: The liver has a stable appearance with diffuse steatosis, relative enlargement of the left lobe and scarring and fibrosis throughout the right lobe. No focal hepatic lesions are identified. There are several small calcified gallstones. There is new gallbladder distention with wall thickening and surrounding inflammation worrisome for acute cholecystitis. There is mild prominence of the extrahepatic biliary system which measures up to 8 mm in diameter. No intraductal calculi seen. Pancreas: There are multiple parenchymal calcifications throughout the pancreas with associated atrophy consistent with chronic calcific pancreatitis. There is no evidence of pancreatic mass, significant ductal dilatation or surrounding inflammation. Spleen: Normal in size without focal abnormality. Adrenals/Urinary Tract: Both adrenal glands appear normal. The kidneys appear stable without focal abnormality, hydronephrosis or nephrolithiasis. The bladder is nearly empty and suboptimally evaluated. Stomach/Bowel: No evidence of bowel wall thickening, distention or surrounding inflammatory change. Vascular/Lymphatic: There are no enlarged abdominal or pelvic lymph nodes. Mild aortoiliac atherosclerosis. Reproductive: Stable. There is a left uterine  fundal fibroid. There are mild gonadal vein varices on the left. Other: No ascites or peritoneal nodularity. Small umbilical hernia containing only fat. Musculoskeletal: Right total hip arthroplasty and posttraumatic deformities within the left pubic bones are noted. There is chronic left femoral head avascular necrosis. There is a moderate convex left lumbar scoliosis with associated spondylosis. There is chronic atrophy of the right iliopsoas musculature. IMPRESSION: 1. New gallbladder distention, wall thickening and surrounding inflammatory changes worrisome for acute cholecystitis. 2. Calcific pancreatitis without evidence of acute inflammation or significant ductal dilatation. 3. Stable cirrhotic changes within the liver with fibrosis in the right lobe. 4. Stable osseous findings, including the spondylosis and chronic left femoral head avascular necrosis. Electronically Signed   By: Carey Bullocks M.D.   On: 03/26/2015 21:30   Dg Chest Port 1 View  03/26/2015  CLINICAL DATA:  Myocardial infarction.  Ventricular tachycardia EXAM: PORTABLE CHEST 1 VIEW COMPARISON:  09/29/2014 FINDINGS: Normal heart size and  stable aortic contours. There is mild atelectatic type opacity at the medial right base. No edema, effusion, or consolidation. Remote left sixth rib fracture. No acute osseous finding. IMPRESSION: Mild right basilar atelectasis. Electronically Signed   By: Marnee Spring M.D.   On: 03/26/2015 11:13   Dg Abd 2 Views  03/26/2015  CLINICAL DATA:  Nausea and vomiting EXAM: ABDOMEN - 2 VIEW COMPARISON:  None. FINDINGS: Scattered large and small bowel gas is noted. Degenerative changes of the lumbar spine are seen with a scoliosis concave to the right. Postsurgical changes in the right hip are noted. Prior fractures in the left pubic rami are seen. No free air is noted. No acute abnormality noted. IMPRESSION: No acute abnormality seen. Electronically Signed   By: Alcide Clever M.D.   On: 03/26/2015 14:04     CBC  Recent Labs Lab 03/26/15 1109 03/27/15 0512 03/28/15 0503  WBC 5.3 6.0 5.6  HGB 11.8* 10.9* 11.1*  HCT 36.0 33.2* 34.4*  PLT 158 166 144*  MCV 95.5 97.9 98.3  MCH 31.3 32.2 31.7  MCHC 32.8 32.8 32.3  RDW 13.7 14.0 14.1    Chemistries   Recent Labs Lab 03/26/15 1109 03/26/15 1513 03/27/15 0512 03/28/15 0503  NA 143 142 135 136  K 2.8* 3.5 4.1 4.3  CL 105 103 103 105  CO2 GLUCOSE 135* 116* 112* 86  BUN 7 8 <5* <5*  CREATININE 0.77 0.81 0.78 0.78  CALCIUM 9.0 8.9 7.6* 7.8*  MG 0.8* 1.9  --   --   AST 134*  --  128* 65*  ALT 30  --  46 37  ALKPHOS 178*  --  170* 159*  BILITOT 1.3*  --  4.3* 5.7*   ------------------------------------------------------------------------------------------------------------------ estimated creatinine clearance is 55.7 mL/min (by C-G formula based on Cr of 0.78). ------------------------------------------------------------------------------------------------------------------ No results for input(s): HGBA1C in the last 72 hours. ------------------------------------------------------------------------------------------------------------------ No results for input(s): CHOL, HDL, LDLCALC, TRIG, CHOLHDL, LDLDIRECT in the last 72 hours. ------------------------------------------------------------------------------------------------------------------ No results for input(s): TSH, T4TOTAL, T3FREE, THYROIDAB in the last 72 hours.  Invalid input(s): FREET3 ------------------------------------------------------------------------------------------------------------------ No results for input(s): VITAMINB12, FOLATE, FERRITIN, TIBC, IRON, RETICCTPCT in the last 72 hours.  Coagulation profile No results for input(s): INR, PROTIME in the last 168 hours.  No results for input(s): DDIMER in the last 72 hours.  Cardiac Enzymes  Recent Labs Lab 03/26/15 1109  TROPONINI <0.03    ------------------------------------------------------------------------------------------------------------------ Invalid input(s): POCBNP  No results for input(s): GLUCAP in the last 72 hours.   Zyasia Halbleib M.D. [facility] 03/28/2015, 11:06 AM  Pager: [phone] Between 7am to 7pm - call Pager - [phone]  After 7pm go to [email or web address] - password [email or web address]  Call night coverage person covering after 7pm

## 2015-03-28 NOTE — Progress Notes (Signed)
Subjective: Significant epigastric abdominal pain yesterday, resolved today.  Objective: Vital signs in last 24 hours: Temp:  [98.1 F (36.7 C)-98.8 F (37.1 C)] 98.4 F (36.9 C) (12/10 1325) Pulse Rate:  [68-90] 90 (12/10 1325) Resp:  [18] 18 (12/10 1325) BP: (96-111)/(67-77) 101/67 mmHg (12/10 1325) SpO2:  [92 %-98 %] 98 % (12/10 1325) Weight change:  Last BM Date: 03/26/15  PE: GEN:  NAD, pleasant and friendly HEENT:  Scleral icterus noted ABD:  Soft, protuberant, non-tender, active bowel sounds  Lab Results: CBC    Component Value Date/Time   WBC 5.6 03/28/2015 0503   WBC 5.5 02/06/2013 1336   RBC 3.50* 03/28/2015 0503   RBC 3.72 02/06/2013 1336   RBC 1.83* 07/27/2011 1709   HGB 11.1* 03/28/2015 0503   HGB 12.3 02/06/2013 1336   HCT 34.4* 03/28/2015 0503   HCT 36.8 02/06/2013 1336   PLT 144* 03/28/2015 0503   PLT 195 02/06/2013 1336   MCV 98.3 03/28/2015 0503   MCV 98.9 02/06/2013 1336   MCH 31.7 03/28/2015 0503   MCH 33.0 02/06/2013 1336   MCHC 32.3 03/28/2015 0503   MCHC 33.4 02/06/2013 1336   RDW 14.1 03/28/2015 0503   RDW 14.3 02/06/2013 1336   LYMPHSABS 0.8 09/29/2014 1218   LYMPHSABS 1.3 02/06/2013 1336   MONOABS 0.7 09/29/2014 1218   MONOABS 0.7 02/06/2013 1336   EOSABS 0.2 09/29/2014 1218   EOSABS 0.1 02/06/2013 1336   BASOSABS 0.0 09/29/2014 1218   BASOSABS 0.1 02/06/2013 1336   CMP     Component Value Date/Time   NA 136 03/28/2015 0503   NA 136 02/06/2013 1336   K 4.3 03/28/2015 0503   K 4.0 02/06/2013 1336   CL 105 03/28/2015 0503   CO2 22 03/28/2015 0503   CO2 25 02/06/2013 1336   GLUCOSE 86 03/28/2015 0503   GLUCOSE 162* 02/06/2013 1336   GLUCOSE 97 11/30/2009   BUN <5* 03/28/2015 0503   BUN 12.2 02/06/2013 1336   CREATININE 0.78 03/28/2015 0503   CREATININE 1.1 02/06/2013 1336   CALCIUM 7.8* 03/28/2015 0503   CALCIUM 9.2 02/06/2013 1336   CALCIUM 8.4 07/26/2011 1329   PROT 5.1* 03/28/2015 0503   PROT 8.1 02/06/2013 1336   ALBUMIN 2.3* 03/28/2015 0503   ALBUMIN 3.0* 02/06/2013 1336   AST 65* 03/28/2015 0503   AST 41* 02/06/2013 1336   ALT 37 03/28/2015 0503   ALT 20 02/06/2013 1336   ALKPHOS 159* 03/28/2015 0503   ALKPHOS 187* 02/06/2013 1336   BILITOT 5.7* 03/28/2015 0503   BILITOT 0.75 02/06/2013 1336   GFRNONAA >60 03/28/2015 0503   GFRAA >60 03/28/2015 0503   Assessment:  1.  Abdominal pain, resolved.  Wonder if she has ballvalving cystic duct and/or common bile duct stone.  HIDA scan equivocal. 2.  Elevated LFTs. 3.  Chronic pancreatitis on CT and endoscopic ultrasound. 4.  Cut-off of pancreatic duct on MRI last month, with concern for pancreatic mass, not appreciated on recent endoscopic ultrasound or recent CT scan.  Recent normal Ca 19-9. 5.  Alcohol-mediated cirrhosis.  Alcohol-free for years with much improvement of overall liver function as a result.  Plan:  1.  Clear liquid diets. 2.  Continue antibiotics. 3.  If no improvement of LFTs, will need to obtain MRCP. 4.  Appreciate ongoing surgical follow-up. 5.  [facility] will follow.   Freddy JakschOUTLAW,Merly Hinkson M 03/28/2015, 2:01 PM   Pager [phone] If no answer or after 5 PM call [phone]

## 2015-03-29 ENCOUNTER — Inpatient Hospital Stay (HOSPITAL_COMMUNITY): Payer: Medicare Other

## 2015-03-29 LAB — LIPASE, BLOOD: LIPASE: 16 U/L (ref 11–51)

## 2015-03-29 LAB — COMPREHENSIVE METABOLIC PANEL
ALT: 28 U/L (ref 14–54)
AST: 40 U/L (ref 15–41)
Albumin: 2.2 g/dL — ABNORMAL LOW (ref 3.5–5.0)
Alkaline Phosphatase: 152 U/L — ABNORMAL HIGH (ref 38–126)
Anion gap: 9 (ref 5–15)
BUN: <5 mg/dL — ABNORMAL LOW (ref 6–20)
CALCIUM: 7.6 mg/dL — AB (ref 8.9–10.3)
CO2: 24 mmol/L (ref 22–32)
CREATININE: 0.8 mg/dL (ref 0.44–1.00)
Chloride: 103 mmol/L (ref 101–111)
Glucose, Bld: 85 mg/dL (ref 65–99)
Potassium: 3.4 mmol/L — ABNORMAL LOW (ref 3.5–5.1)
Sodium: 136 mmol/L (ref 135–145)
Total Bilirubin: 6.2 mg/dL — ABNORMAL HIGH (ref 0.3–1.2)
Total Protein: 4.8 g/dL — ABNORMAL LOW (ref 6.5–8.1)

## 2015-03-29 LAB — CBC
HCT: 31.5 % — ABNORMAL LOW (ref 36.0–46.0)
Hemoglobin: 10.1 g/dL — ABNORMAL LOW (ref 12.0–15.0)
MCH: 31.6 pg (ref 26.0–34.0)
MCHC: 32.1 g/dL (ref 30.0–36.0)
MCV: 98.4 fL (ref 78.0–100.0)
PLATELETS: 150 10*3/uL (ref 150–400)
RBC: 3.2 MIL/uL — ABNORMAL LOW (ref 3.87–5.11)
RDW: 13.8 % (ref 11.5–15.5)
WBC: 4.6 10*3/uL (ref 4.0–10.5)

## 2015-03-29 MED ORDER — GADOBENATE DIMEGLUMINE 529 MG/ML IV SOLN
11.0000 mL | Freq: Once | INTRAVENOUS | Status: AC | PRN
  Administered 2015-03-29: 11 mL via INTRAVENOUS

## 2015-03-29 MED ORDER — POTASSIUM CHLORIDE CRYS ER 20 MEQ PO TBCR
40.0000 meq | EXTENDED_RELEASE_TABLET | Freq: Once | ORAL | Status: AC
  Administered 2015-03-29: 40 meq via ORAL
  Filled 2015-03-29: qty 2

## 2015-03-29 NOTE — Progress Notes (Signed)
Pt's BP is 99/72 mm of hg. On-call provider Benedetto Coons callahan, NP notified . Order received to hold inderal tonight. Will monitor.

## 2015-03-29 NOTE — Progress Notes (Signed)
Subjective: Abdominal pain improving, but she's still requiring periodic doses of narcotics.  Objective: Vital signs in last 24 hours: Temp:  [98.2 F (36.8 C)-98.6 F (37 C)] 98.2 F (36.8 C) (12/11 0604) Pulse Rate:  [73-90] 73 (12/11 0940) Resp:  [16-18] 16 (12/11 0604) BP: (101-120)/(67-73) 120/68 mmHg (12/11 0940) SpO2:  [98 %-100 %] 100 % (12/11 0604) Weight change:  Last BM Date: 03/26/15  PE: GEN:  NAD, jaundiced. Skin:  Jaundiced. ABD:  Soft, mild upper abdominal tenderness.  Lab Results: CBC    Component Value Date/Time   WBC 4.6 03/29/2015 0418   WBC 5.5 02/06/2013 1336   RBC 3.20* 03/29/2015 0418   RBC 3.72 02/06/2013 1336   RBC 1.83* 07/27/2011 1709   HGB 10.1* 03/29/2015 0418   HGB 12.3 02/06/2013 1336   HCT 31.5* 03/29/2015 0418   HCT 36.8 02/06/2013 1336   PLT 150 03/29/2015 0418   PLT 195 02/06/2013 1336   MCV 98.4 03/29/2015 0418   MCV 98.9 02/06/2013 1336   MCH 31.6 03/29/2015 0418   MCH 33.0 02/06/2013 1336   MCHC 32.1 03/29/2015 0418   MCHC 33.4 02/06/2013 1336   RDW 13.8 03/29/2015 0418   RDW 14.3 02/06/2013 1336   LYMPHSABS 0.8 09/29/2014 1218   LYMPHSABS 1.3 02/06/2013 1336   MONOABS 0.7 09/29/2014 1218   MONOABS 0.7 02/06/2013 1336   EOSABS 0.2 09/29/2014 1218   EOSABS 0.1 02/06/2013 1336   BASOSABS 0.0 09/29/2014 1218   BASOSABS 0.1 02/06/2013 1336   CMP     Component Value Date/Time   NA 136 03/29/2015 0418   NA 136 02/06/2013 1336   K 3.4* 03/29/2015 0418   K 4.0 02/06/2013 1336   CL 103 03/29/2015 0418   CO2 24 03/29/2015 0418   CO2 25 02/06/2013 1336   GLUCOSE 85 03/29/2015 0418   GLUCOSE 162* 02/06/2013 1336   GLUCOSE 97 11/30/2009   BUN <5* 03/29/2015 0418   BUN 12.2 02/06/2013 1336   CREATININE 0.80 03/29/2015 0418   CREATININE 1.1 02/06/2013 1336   CALCIUM 7.6* 03/29/2015 0418   CALCIUM 9.2 02/06/2013 1336   CALCIUM 8.4 07/26/2011 1329   PROT 4.8* 03/29/2015 0418   PROT 8.1 02/06/2013 1336   ALBUMIN 2.2*  03/29/2015 0418   ALBUMIN 3.0* 02/06/2013 1336   AST 40 03/29/2015 0418   AST 41* 02/06/2013 1336   ALT 28 03/29/2015 0418   ALT 20 02/06/2013 1336   ALKPHOS 152* 03/29/2015 0418   ALKPHOS 187* 02/06/2013 1336   BILITOT 6.2* 03/29/2015 0418   BILITOT 0.75 02/06/2013 1336   GFRNONAA >60 03/29/2015 0418   GFRAA >60 03/29/2015 0418   Assessment:   1. Abdominal pain, resolved. Wonder if she has ballvalving cystic duct and/or common bile duct stone. HIDA scan equivocal. 2. Elevated LFTs.  Essentially now isolated hyperbilirubinemia.  While obstruction of bile duct is possibility, I would expect her ALP to increase concurrently with the TB (but the ALP is decreasing while the TB is increasing).  Penicillin-based antibiotics can cause isolated hyperbilirubinemia.  Increase in liver dysfunction possible.  Level too elevated for consideration of Gilbert's syndrome. 3. Chronic pancreatitis on CT and endoscopic ultrasound. 4. Cut-off of pancreatic duct on MRI last month, with concern for pancreatic mass, not appreciated on recent endoscopic ultrasound or recent CT scan. Recent normal Ca 19-9. 5. Alcohol-mediated cirrhosis. Alcohol-free for years with much improvement of overall liver function as a result.  Plan:  1.  Given increasing total bilirubin, will obtain  MRI/MRCP. 2.  If MRI/MRCP suggests obstruction, might have to consider ERCP; if MRI/MRCP is unrevealing, might consider change in antibiotics. 3.  [facility] will follow.   Freddy JakschOUTLAW,Kiauna Zywicki M 03/29/2015, 11:14 AM   Pager [phone] If no answer or after 5 PM call [phone]

## 2015-03-29 NOTE — Progress Notes (Signed)
  Subjective: Pain is better, tolerating clears  Objective: Vital signs in last 24 hours: Temp:  [98.2 F (36.8 C)-98.6 F (37 C)] 98.2 F (36.8 C) (12/11 0604) Pulse Rate:  [76-90] 76 (12/11 0604) Resp:  [16-18] 16 (12/11 0604) BP: (101-104)/(67-73) 104/73 mmHg (12/11 0604) SpO2:  [98 %-100 %] 100 % (12/11 0604) Last BM Date: 03/26/15  Intake/Output from previous day: 12/10 0701 - 12/11 0700 In: 3980 [P.O.:980; I.V.:2950; IV Piggyback:50] Out: 900 [Urine:900] Intake/Output this shift: Total I/O In: -  Out: 350 [Urine:350]  General appearance: alert and cooperative Resp: clear to auscultation bilaterally GI: soft, minimal tenderness RUQ  Lab Results:   Recent Labs  03/28/15 0503 03/29/15 0418  WBC 5.6 4.6  HGB 11.1* 10.1*  HCT 34.4* 31.5*  PLT 144* 150   BMET  Recent Labs  03/28/15 0503 03/29/15 0418  NA 136 136  K 4.3 3.4*  CL 105 103  CO2 22 24  GLUCOSE 86 85  BUN <5* <5*  CREATININE 0.78 0.80  CALCIUM 7.8* 7.6*   PT/INR No results for input(s): LABPROT, INR in the last 72 hours. ABG No results for input(s): PHART, HCO3 in the last 72 hours.  Invalid input(s): PCO2, PO2  Studies/Results: Nm Hepatobiliary Liver Func  03/27/2015  CLINICAL DATA:  Cholecystitis.  Elevated bilirubin. EXAM: NUCLEAR MEDICINE HEPATOBILIARY IMAGING TECHNIQUE: Sequential images of the abdomen were obtained out to 60 minutes following intravenous administration of radiopharmaceutical. RADIOPHARMACEUTICALS:  5.25 mCi Tc-7133m  Choletec IV COMPARISON:  CT scan 03/26/2015 FINDINGS: At 1 hour no activity was seen in the biliary tree or bowel. At 2 hours there is some faint activity seen in the bowel but no definite biliary activity. Probable faint activity in the duodenum. I do not see any convincing gallbladder activity at 2 hours. IMPRESSION: Minimal excretion of the radiopharmaceutical is identified from the liver. This could be due to hepatic dysfunction or partial common bile  duct obstruction or both. Gallbladder is not definitely identified suggesting cystic duct obstruction. Electronically Signed   By: Rudie MeyerP.  Gallerani M.D.   On: 03/27/2015 17:08    Anti-infectives: Anti-infectives    Start     Dose/Rate Route Frequency Ordered Stop   03/27/15 0830  piperacillin-tazobactam (ZOSYN) IVPB 3.375 g     3.375 g 12.5 mL/hr over 240 Minutes Intravenous Every 8 hours 03/27/15 [id number]        Assessment/Plan: Cholecystitis - high operative risk with cirrhosis, Zosyn, continue clears Hyperbilirubinemia - appreciate GI F/U  LOS: 3 days    Regenia Erck E 03/29/2015

## 2015-03-29 NOTE — Progress Notes (Signed)
[facility]                                                                              Patient Demographics  Kathryn [name], is a 64 y.o. female, DOB - 06/09/1950, WUJ:[id number]  Admit date - 03/26/2015   Admitting Physician Alba Cory, MD  Outpatient Primary MD for the patient is Lupe Carney, MD  LOS - 3   Chief Complaint  Patient presents with  . Pancreatitis       Brief HPI   Patient is a 64 year old female with prior history of alcohol use, cirrhosis, chronic pancreatitis, PVD, hypertension.  She presented to the ER after a sudden onset of abdominal pain and vomiting on the morning of admission. Patient had an EUS (without biopsy) on 12/7 by Dr. Dulce Sellar. Her bile duct was not cannulated during the procedure. She felt well immediately after the procedure however on the morning of admission she woke up at 5 AM with vomiting and pain. In the emergency department her potassium was 2.8 and mag was 0.8. Total bili was 1/2 and AST 134. Amylase 95, Lipase 42. Xrays of her chest and abdomen were negative. EKG shows sinus rhythm with a prolonged QTc. Patient was admitted for acute on chronic pancreatitis.  Assessment & Plan    Principal Problem:  Acute exacerbation of chronic pancreatitis, acute cholecystitis - CT scan of the abdomen and pelvis reviewed, shows gallbladder wall edema with acute cholecystitis.  - HIDA scan shows hepatic dysfunction or partial CBD obstruction of both, gallbladder not definitely identified suggesting cystic duct obstruction. -  General surgery, GI following, recommending clear liquid diet and continue IV antibiotics, High operative risk with cirrhosis  Active problems  Lactic acidosis with dehydration  - Continue IV fluid hydration, lactic acid improving    Palpitations: Patient had a short run of V. tach (7 beats) likely due to electrolyte abnormalities on admission. - BMET stable, keep K ~4, MG >2 - replace  potassium  Alcoholic liver disease with possible pancreatic duct obstruction/lesion MRI performed in November 2016 showed possible pancreatic lesion. EUS performed yesterday by Dr. Dulce Sellar. Continue outpatient management.  Code Status: Full CODE STATUS   Family Communication: Discussed in detail with the patient, all imaging results, lab results explained to the patient    Disposition Plan:   Time Spent in minutes  25 minutes  Procedures  CT abd HIDA scan  Consults   GI Surgery  DVT Prophylaxis heparin subcutaneous   Medications  Scheduled Meds: . allopurinol  100 mg Oral BID  . heparin  5,000 Units Subcutaneous 3 times per day  . pantoprazole (PROTONIX) IV  40 mg Intravenous Q12H  . piperacillin-tazobactam (ZOSYN)  IV  3.375 g Intravenous Q8H  . propranolol  10 mg Oral BID  . sodium chloride  3 mL Intravenous Q12H   Continuous Infusions: . sodium chloride 125 mL/hr at 03/29/15 0441   PRN Meds:.acetaminophen **OR** acetaminophen, HYDROmorphone (DILAUDID) injection, polyethylene glycol   Antibiotics   Anti-infectives    Start     Dose/Rate Route Frequency Ordered Stop   03/27/15 0830  piperacillin-tazobactam (ZOSYN) IVPB 3.375 g  3.375 g 12.5 mL/hr over 240 Minutes Intravenous Every 8 hours 03/27/15 0826          Subjective:   Kathryn [name] was seen and examined today. Feeling better, abdominal pain is improving, no nausea, vomiting. No fevers or chills. Patient denies dizziness, chest pain, shortness of breath, new weakness, numbess, tingling. No acute events overnight.    Objective:   Blood pressure 120/68, pulse 73, temperature 98.2 F (36.8 C), temperature source Oral, resp. rate 16, height 4' 11.5" (1.511 m), weight 54.25 kg (119 lb 9.6 oz), SpO2 100 %.  Wt Readings from Last 3 Encounters:  03/26/15 54.25 kg (119 lb 9.6 oz)  12/26/14 49.442 kg (109 lb)  12/25/14 49.442 kg (109 lb)     Intake/Output Summary (Last 24 hours) at 03/29/15 1036 Last  data filed at 03/29/15 1013  Gross per 24 hour  Intake   4280 ml  Output   1200 ml  Net   3080 ml    Exam  General: Alert and oriented x 3, NAD  HEENT:  PERRLA, EOMI  Neck: Supple, no JVD, no masses  CVS: S1 S2 clear, RRR  Respiratory: CTAB  Abdomen: Soft, minimal tenderness in RUQ, ND, NBS  Ext: no cyanosis clubbing or edema  Neuro: no new deficits  Skin: No rashes  Psych: Normal affect and demeanor, alert and oriented x3    Data Review   Micro Results No results found for this or any previous visit (from the past 240 hour(s)).  Radiology Reports Nm Hepatobiliary Liver Func  03/27/2015  CLINICAL DATA:  Cholecystitis.  Elevated bilirubin. EXAM: NUCLEAR MEDICINE HEPATOBILIARY IMAGING TECHNIQUE: Sequential images of the abdomen were obtained out to 60 minutes following intravenous administration of radiopharmaceutical. RADIOPHARMACEUTICALS:  5.25 mCi Tc-757m  Choletec IV COMPARISON:  CT scan 03/26/2015 FINDINGS: At 1 hour no activity was seen in the biliary tree or bowel. At 2 hours there is some faint activity seen in the bowel but no definite biliary activity. Probable faint activity in the duodenum. I do not see any convincing gallbladder activity at 2 hours. IMPRESSION: Minimal excretion of the radiopharmaceutical is identified from the liver. This could be due to hepatic dysfunction or partial common bile duct obstruction or both. Gallbladder is not definitely identified suggesting cystic duct obstruction. Electronically Signed   By: Rudie MeyerP.  Gallerani M.D.   On: 03/27/2015 17:08   Ct Abdomen Pelvis W Contrast  03/26/2015  CLINICAL DATA:  Left upper quadrant abdominal pain and tenderness with nausea and vomiting. History of alcoholic liver disease, pancreatitis and colitis. EXAM: CT ABDOMEN AND PELVIS WITH CONTRAST TECHNIQUE: Multidetector CT imaging of the abdomen and pelvis was performed using the standard protocol following bolus administration of intravenous contrast.  CONTRAST:  100mL OMNIPAQUE IOHEXOL 300 MG/ML  SOLN COMPARISON:  Abdominal pelvic CT 01/02/2012. Abdominal MRI 02/26/2015. FINDINGS: Lower chest: Trace pleural effusions with mild bibasilar atelectasis, improved from previous CT. Small hiatal hernia. Hepatobiliary: The liver has a stable appearance with diffuse steatosis, relative enlargement of the left lobe and scarring and fibrosis throughout the right lobe. No focal hepatic lesions are identified. There are several small calcified gallstones. There is new gallbladder distention with wall thickening and surrounding inflammation worrisome for acute cholecystitis. There is mild prominence of the extrahepatic biliary system which measures up to 8 mm in diameter. No intraductal calculi seen. Pancreas: There are multiple parenchymal calcifications throughout the pancreas with associated atrophy consistent with chronic calcific pancreatitis. There is no evidence of pancreatic mass, significant  ductal dilatation or surrounding inflammation. Spleen: Normal in size without focal abnormality. Adrenals/Urinary Tract: Both adrenal glands appear normal. The kidneys appear stable without focal abnormality, hydronephrosis or nephrolithiasis. The bladder is nearly empty and suboptimally evaluated. Stomach/Bowel: No evidence of bowel wall thickening, distention or surrounding inflammatory change. Vascular/Lymphatic: There are no enlarged abdominal or pelvic lymph nodes. Mild aortoiliac atherosclerosis. Reproductive: Stable. There is a left uterine fundal fibroid. There are mild gonadal vein varices on the left. Other: No ascites or peritoneal nodularity. Small umbilical hernia containing only fat. Musculoskeletal: Right total hip arthroplasty and posttraumatic deformities within the left pubic bones are noted. There is chronic left femoral head avascular necrosis. There is a moderate convex left lumbar scoliosis with associated spondylosis. There is chronic atrophy of the right  iliopsoas musculature. IMPRESSION: 1. New gallbladder distention, wall thickening and surrounding inflammatory changes worrisome for acute cholecystitis. 2. Calcific pancreatitis without evidence of acute inflammation or significant ductal dilatation. 3. Stable cirrhotic changes within the liver with fibrosis in the right lobe. 4. Stable osseous findings, including the spondylosis and chronic left femoral head avascular necrosis. Electronically Signed   By: Carey Bullocks M.D.   On: 03/26/2015 21:30   Dg Chest Port 1 View  03/26/2015  CLINICAL DATA:  Myocardial infarction.  Ventricular tachycardia EXAM: PORTABLE CHEST 1 VIEW COMPARISON:  09/29/2014 FINDINGS: Normal heart size and stable aortic contours. There is mild atelectatic type opacity at the medial right base. No edema, effusion, or consolidation. Remote left sixth rib fracture. No acute osseous finding. IMPRESSION: Mild right basilar atelectasis. Electronically Signed   By: Marnee Spring M.D.   On: 03/26/2015 11:13   Dg Abd 2 Views  03/26/2015  CLINICAL DATA:  Nausea and vomiting EXAM: ABDOMEN - 2 VIEW COMPARISON:  None. FINDINGS: Scattered large and small bowel gas is noted. Degenerative changes of the lumbar spine are seen with a scoliosis concave to the right. Postsurgical changes in the right hip are noted. Prior fractures in the left pubic rami are seen. No free air is noted. No acute abnormality noted. IMPRESSION: No acute abnormality seen. Electronically Signed   By: Alcide Clever M.D.   On: 03/26/2015 14:04    CBC  Recent Labs Lab 03/26/15 1109 03/27/15 0512 03/28/15 0503 03/29/15 0418  WBC 5.3 6.0 5.6 4.6  HGB 11.8* 10.9* 11.1* 10.1*  HCT 36.0 33.2* 34.4* 31.5*  PLT 158 166 144* 150  MCV 95.5 97.9 98.3 98.4  MCH 31.3 32.2 31.7 31.6  MCHC 32.8 32.8 32.3 32.1  RDW 13.7 14.0 14.1 13.8    Chemistries   Recent Labs Lab 03/26/15 1109 03/26/15 1513 03/27/15 0512 03/28/15 0503 03/29/15 0418  NA 143 142 135 136 136  K  2.8* 3.5 4.1 4.3 3.4*  CL 105 103 103 105 103  CO2 GLUCOSE 135* 116* 112* 86 85  BUN 7 8 <5* <5* <5*  CREATININE 0.77 0.81 0.78 0.78 0.80  CALCIUM 9.0 8.9 7.6* 7.8* 7.6*  MG 0.8* 1.9  --   --   --   AST 134*  --  128* 65* 40  ALT 30  --  46 37 28  ALKPHOS 178*  --  170* 159* 152*  BILITOT 1.3*  --  4.3* 5.7* 6.2*   ------------------------------------------------------------------------------------------------------------------ estimated creatinine clearance is 55.7 mL/min (by C-G formula based on Cr of 0.8). ------------------------------------------------------------------------------------------------------------------ No results for input(s): HGBA1C in the last 72 hours. ------------------------------------------------------------------------------------------------------------------ No results for input(s): CHOL, HDL, LDLCALC, TRIG,  CHOLHDL, LDLDIRECT in the last 72 hours. ------------------------------------------------------------------------------------------------------------------ No results for input(s): TSH, T4TOTAL, T3FREE, THYROIDAB in the last 72 hours.  Invalid input(s): FREET3 ------------------------------------------------------------------------------------------------------------------ No results for input(s): VITAMINB12, FOLATE, FERRITIN, TIBC, IRON, RETICCTPCT in the last 72 hours.  Coagulation profile No results for input(s): INR, PROTIME in the last 168 hours.  No results for input(s): DDIMER in the last 72 hours.  Cardiac Enzymes  Recent Labs Lab 03/26/15 1109  TROPONINI <0.03   ------------------------------------------------------------------------------------------------------------------ Invalid input(s): POCBNP  No results for input(s): GLUCAP in the last 72 hours.   RAI,RIPUDEEP M.D. [facility] 03/29/2015, 10:36 AM  Pager: [phone] Between 7am to 7pm - call Pager - [phone]  After 7pm go to [email or web address] -  password [email or web address]  Call night coverage person covering after 7pm

## 2015-03-29 NOTE — Care Management Important Message (Signed)
Important Message  Patient Details  Name: Kathryn [name] MRN: [id number] Date of Birth: [birth year]   Medicare Important Message Given:  Yes    Antony HasteBennett, Elzada Pytel Harris, RN 03/29/2015, 10:47 AM

## 2015-03-30 LAB — CBC
HEMATOCRIT: 31.6 % — AB (ref 36.0–46.0)
HEMOGLOBIN: 9.8 g/dL — AB (ref 12.0–15.0)
MCH: 30.8 pg (ref 26.0–34.0)
MCHC: 31 g/dL (ref 30.0–36.0)
MCV: 99.4 fL (ref 78.0–100.0)
Platelets: 158 10*3/uL (ref 150–400)
RBC: 3.18 MIL/uL — AB (ref 3.87–5.11)
RDW: 14 % (ref 11.5–15.5)
WBC: 4.5 10*3/uL (ref 4.0–10.5)

## 2015-03-30 LAB — COMPREHENSIVE METABOLIC PANEL
ALBUMIN: 2 g/dL — AB (ref 3.5–5.0)
ALT: 20 U/L (ref 14–54)
ANION GAP: 8 (ref 5–15)
AST: 32 U/L (ref 15–41)
Alkaline Phosphatase: 152 U/L — ABNORMAL HIGH (ref 38–126)
BUN: <5 mg/dL — ABNORMAL LOW (ref 6–20)
CHLORIDE: 107 mmol/L (ref 101–111)
CO2: 20 mmol/L — AB (ref 22–32)
Calcium: 7.4 mg/dL — ABNORMAL LOW (ref 8.9–10.3)
Creatinine, Ser: 0.82 mg/dL (ref 0.44–1.00)
GFR calc non Af Amer: >60 mL/min (ref >60)
Glucose, Bld: 87 mg/dL (ref 65–99)
POTASSIUM: 3.7 mmol/L (ref 3.5–5.1)
SODIUM: 135 mmol/L (ref 135–145)
Total Bilirubin: 5 mg/dL — ABNORMAL HIGH (ref 0.3–1.2)
Total Protein: 4.4 g/dL — ABNORMAL LOW (ref 6.5–8.1)

## 2015-03-30 LAB — PROCALCITONIN: PROCALCITONIN: 0.45 ng/mL

## 2015-03-30 MED ORDER — PANTOPRAZOLE SODIUM 40 MG PO TBEC
40.0000 mg | DELAYED_RELEASE_TABLET | Freq: Two times a day (BID) | ORAL | Status: DC
  Administered 2015-03-30 – 2015-04-02 (×6): 40 mg via ORAL
  Filled 2015-03-30 (×6): qty 1

## 2015-03-30 NOTE — Progress Notes (Signed)
[facility]                                                                              Patient Demographics  Kathryn [name], is a 64 y.o. female, DOB - 1950-11-23, ZOX:[id number]  Admit date - 03/26/2015   Admitting Physician Alba Cory, MD  Outpatient Primary MD for the patient is Lupe Carney, MD  LOS - 4   Chief Complaint  Patient presents with  . Pancreatitis       Brief HPI   Patient is a 64 year old female with prior history of alcohol use, cirrhosis, chronic pancreatitis, PVD, hypertension.  She presented to the ER after a sudden onset of abdominal pain and vomiting on the morning of admission. Patient had an EUS (without biopsy) on 12/7 by Dr. Dulce Sellar. Her bile duct was not cannulated during the procedure. She felt well immediately after the procedure however on the morning of admission she woke up at 5 AM with vomiting and pain. In the emergency department her potassium was 2.8 and mag was 0.8. Total bili was 1/2 and AST 134. Amylase 95, Lipase 42. Xrays of her chest and abdomen were negative. EKG shows sinus rhythm with a prolonged QTc. Patient was admitted for acute on chronic pancreatitis.  Assessment & Plan    Principal Problem:  Acute exacerbation of chronic pancreatitis, acute cholecystitis - CT scan of the abdomen and pelvis reviewed, shows gallbladder wall edema with acute cholecystitis.  - HIDA scan shows hepatic dysfunction or partial CBD obstruction of both, gallbladder not definitely identified suggesting cystic duct obstruction. -  Advance diet today, continue IV antibiotics, patient is improving.  -  High operative risk with cirrhosis  Active problems  Lactic acidosis with dehydration  - Continue IV fluid hydration, lactic acid improving   Palpitations: Patient had a short run of V. tach (7 beats) likely due to electrolyte abnormalities on admission. - BMET stable, keep K ~4, MG >2 - replace potassium  Alcoholic liver  disease with possible pancreatic duct obstruction/lesion MRI performed in November 2016 showed possible pancreatic lesion. EUS performed yesterday by Dr. Dulce Sellar. Continue outpatient management.  Code Status: Full CODE STATUS   Family Communication: Discussed in detail with the patient, all imaging results, lab results explained to the patient    Disposition Plan:   Time Spent in minutes 15 minutes  Procedures  CT abd HIDA scan  Consults   GI Surgery  DVT Prophylaxis heparin subcutaneous   Medications  Scheduled Meds: . allopurinol  100 mg Oral BID  . heparin  5,000 Units Subcutaneous 3 times per day  . pantoprazole (PROTONIX) IV  40 mg Intravenous Q12H  . piperacillin-tazobactam (ZOSYN)  IV  3.375 g Intravenous Q8H  . sodium chloride  3 mL Intravenous Q12H   Continuous Infusions: . sodium chloride 125 mL/hr at 03/30/15 0800   PRN Meds:.acetaminophen **OR** acetaminophen, HYDROmorphone (DILAUDID) injection, polyethylene glycol   Antibiotics   Anti-infectives    Start     Dose/Rate Route Frequency Ordered Stop   03/27/15 0830  piperacillin-tazobactam (ZOSYN) IVPB 3.375 g     3.375 g 12.5 mL/hr over 240 Minutes Intravenous  Every 8 hours 03/27/15 0826          Subjective:   Kathryn [name] was seen and examined today. Feeling a lot better today, abdominal pain is improving, no fevers or chills or nausea or vomiting. Wants to advance diet. Patient denies dizziness, chest pain, shortness of breath, new weakness, numbess, tingling. No acute events overnight.    Objective:   Blood pressure 106/76, pulse 83, temperature 98.5 F (36.9 C), temperature source Oral, resp. rate 16, height 4' 11.5" (1.511 m), weight 54.25 kg (119 lb 9.6 oz), SpO2 100 %.  Wt Readings from Last 3 Encounters:  03/26/15 54.25 kg (119 lb 9.6 oz)  12/26/14 49.442 kg (109 lb)  12/25/14 49.442 kg (109 lb)     Intake/Output Summary (Last 24 hours) at 03/30/15 1231 Last data filed at 03/30/15  0949  Gross per 24 hour  Intake 3828.33 ml  Output   1850 ml  Net 1978.33 ml    Exam  General: Alert and oriented x 3, NAD  HEENT:  PERRLA, EOMI  Neck: Supple, no JVD, no masses  CVS: S1 S2 clear, RRR  Respiratory: CTAB  Abdomen: Soft, minimal tenderness in RUQ, ND, NBS  Ext: no cyanosis clubbing or edema  Neuro: no new deficits  Skin: No rashes  Psych: Normal affect and demeanor, alert and oriented x3    Data Review   Micro Results No results found for this or any previous visit (from the past 240 hour(s)).  Radiology Reports Nm Hepatobiliary Liver Func  03/27/2015  CLINICAL DATA:  Cholecystitis.  Elevated bilirubin. EXAM: NUCLEAR MEDICINE HEPATOBILIARY IMAGING TECHNIQUE: Sequential images of the abdomen were obtained out to 60 minutes following intravenous administration of radiopharmaceutical. RADIOPHARMACEUTICALS:  5.25 mCi Tc-67m  Choletec IV COMPARISON:  CT scan 03/26/2015 FINDINGS: At 1 hour no activity was seen in the biliary tree or bowel. At 2 hours there is some faint activity seen in the bowel but no definite biliary activity. Probable faint activity in the duodenum. I do not see any convincing gallbladder activity at 2 hours. IMPRESSION: Minimal excretion of the radiopharmaceutical is identified from the liver. This could be due to hepatic dysfunction or partial common bile duct obstruction or both. Gallbladder is not definitely identified suggesting cystic duct obstruction. Electronically Signed   By: Rudie Meyer M.D.   On: 03/27/2015 17:08   Ct Abdomen Pelvis W Contrast  03/26/2015  CLINICAL DATA:  Left upper quadrant abdominal pain and tenderness with nausea and vomiting. History of alcoholic liver disease, pancreatitis and colitis. EXAM: CT ABDOMEN AND PELVIS WITH CONTRAST TECHNIQUE: Multidetector CT imaging of the abdomen and pelvis was performed using the standard protocol following bolus administration of intravenous contrast. CONTRAST:  OMNIPAQUE  IOHEXOL 300 MG/ML  SOLN COMPARISON:  Abdominal pelvic CT 01/02/2012. Abdominal MRI 02/26/2015. FINDINGS: Lower chest: Trace pleural effusions with mild bibasilar atelectasis, improved from previous CT. Small hiatal hernia. Hepatobiliary: The liver has a stable appearance with diffuse steatosis, relative enlargement of the left lobe and scarring and fibrosis throughout the right lobe. No focal hepatic lesions are identified. There are several small calcified gallstones. There is new gallbladder distention with wall thickening and surrounding inflammation worrisome for acute cholecystitis. There is mild prominence of the extrahepatic biliary system which measures up to 8 mm in diameter. No intraductal calculi seen. Pancreas: There are multiple parenchymal calcifications throughout the pancreas with associated atrophy consistent with chronic calcific pancreatitis. There is no evidence of pancreatic mass, significant ductal dilatation or surrounding  inflammation. Spleen: Normal in size without focal abnormality. Adrenals/Urinary Tract: Both adrenal glands appear normal. The kidneys appear stable without focal abnormality, hydronephrosis or nephrolithiasis. The bladder is nearly empty and suboptimally evaluated. Stomach/Bowel: No evidence of bowel wall thickening, distention or surrounding inflammatory change. Vascular/Lymphatic: There are no enlarged abdominal or pelvic lymph nodes. Mild aortoiliac atherosclerosis. Reproductive: Stable. There is a left uterine fundal fibroid. There are mild gonadal vein varices on the left. Other: No ascites or peritoneal nodularity. Small umbilical hernia containing only fat. Musculoskeletal: Right total hip arthroplasty and posttraumatic deformities within the left pubic bones are noted. There is chronic left femoral head avascular necrosis. There is a moderate convex left lumbar scoliosis with associated spondylosis. There is chronic atrophy of the right iliopsoas musculature.  IMPRESSION: 1. New gallbladder distention, wall thickening and surrounding inflammatory changes worrisome for acute cholecystitis. 2. Calcific pancreatitis without evidence of acute inflammation or significant ductal dilatation. 3. Stable cirrhotic changes within the liver with fibrosis in the right lobe. 4. Stable osseous findings, including the spondylosis and chronic left femoral head avascular necrosis. Electronically Signed   By: Carey Bullocks M.D.   On: 03/26/2015 21:30   Mr 3d Recon At Scanner  03/29/2015  CLINICAL DATA:  Cirrhosis, chronic pancreatitis. Abdominal pain and vomiting. Acute on chronic pancreatitis. Elevated liver enzymes. EXAM: MRI ABDOMEN WITHOUT CONTRAST  (INCLUDING MRCP) TECHNIQUE: Multiplanar multisequence MR imaging of the abdomen was performed. Heavily T2-weighted images of the biliary and pancreatic ducts were obtained, and three-dimensional MRCP images were rendered by post processing. COMPARISON:  Multiple exams, including 03/26/2015 CT scan and prior abdominal MRI from 02/26/2015 FINDINGS: Despite efforts by the technologist and patient, motion artifact is present on today's exam and could not be eliminated. This reduces exam sensitivity and specificity. Lower chest: Bilateral pleural effusions with associated atelectasis. Cannot exclude pneumonia in the left lower lobe. Hepatobiliary: Prominently enlarged left hepatic lobe and shrunken right hepatic lobe. Layering gallstones in the gallbladder, images 20 through 26 of series 5, generally in the 2-4 mm range. No biliary dilatation or specific signs of choledocholithiasis, the common bile duct is only 3 mm in diameter. Arterial phase accentuated enhancement in the right hepatic lobe and to a lesser extent laterally in the left hepatic lobe lateral segment is likely from fibrosis, and persists on delayed images especially in the right hepatic lobe. Signal dropout in the liver indicate steatosis. Against this background, I do not  observe a focal lesion of special concern for hepatocellular carcinoma. Pancreas: The pancreas is atrophic. Concern was raised on the prior MRI for the possibility of pancreatic malignancy but the appearance on today' s exam could certainly be explained by pancreatitis, and the dorsal pancreatic duct in the tail of the pancreas no longer appears dilated as it did before. There is peripancreatic stranding also tracking around the splenic artery, favoring acute component to the patient's pancreatitis. There is a mildly enlarged porta hepatis lymph nodes and peripancreatic lymph nodes which may be reactive. Spleen: Unremarkable -no splenomegaly. Adrenals/Urinary Tract: Unremarkable Stomach/Bowel: Somewhat lax ligament of Treitz ; the duodenum crosses midline but does not extend up to the level of the do duodenal bulb. Vascular/Lymphatic: Stable mildly enlarged porta hepatis and peripancreatic lymph nodes as shown on prior exams, likely reactive. Other: No supplemental non-categorized findings. Musculoskeletal: Levoconvex lumbar scoliosis with rotary component. The distal cord appears split on image 15 series 6, compatible with diastematomyelia. This has been previously shown for example on 03/11/2009 lumbar spine MRI. IMPRESSION: 1.  Acute on chronic pancreatitis. Currently there is no specific findings of pancreatic mass, and the dorsal pancreatic duct is not dilated in the pancreatic tail as it was on the prior MRI. 2. Hepatic cirrhosis with enlargement the left hepatic lobe, fibrosis of the right hepatic lobe, and hepatic steatosis. Reactive lymph nodes in the porta hepatis and peripancreatic region. 3. Cholelithiasis, without biliary dilatation or evidence of choledocholithiasis. 4. Lax ligament of Treitz. 5. Bilateral pleural effusions with atelectasis. I can't specifically exclude pneumonia in the left lower lobe. 6. Lumbar scoliosis. 7. Chronic/congenital lower spinal cord diastematomyelia. Electronically Signed    By: Gaylyn RongWalter  Liebkemann M.D.   On: 03/29/2015 16:26   Dg Chest Port 1 View  03/26/2015  CLINICAL DATA:  Myocardial infarction.  Ventricular tachycardia EXAM: PORTABLE CHEST 1 VIEW COMPARISON:  09/29/2014 FINDINGS: Normal heart size and stable aortic contours. There is mild atelectatic type opacity at the medial right base. No edema, effusion, or consolidation. Remote left sixth rib fracture. No acute osseous finding. IMPRESSION: Mild right basilar atelectasis. Electronically Signed   By: Marnee SpringJonathon  Watts M.D.   On: 03/26/2015 11:13   Dg Abd 2 Views  03/26/2015  CLINICAL DATA:  Nausea and vomiting EXAM: ABDOMEN - 2 VIEW COMPARISON:  None. FINDINGS: Scattered large and small bowel gas is noted. Degenerative changes of the lumbar spine are seen with a scoliosis concave to the right. Postsurgical changes in the right hip are noted. Prior fractures in the left pubic rami are seen. No free air is noted. No acute abnormality noted. IMPRESSION: No acute abnormality seen. Electronically Signed   By: Alcide CleverMark  Lukens M.D.   On: 03/26/2015 14:04   Mr Jorja Loabd W Cm/mrcp  03/29/2015  CLINICAL DATA:  Cirrhosis, chronic pancreatitis. Abdominal pain and vomiting. Acute on chronic pancreatitis. Elevated liver enzymes. EXAM: MRI ABDOMEN WITHOUT CONTRAST  (INCLUDING MRCP) TECHNIQUE: Multiplanar multisequence MR imaging of the abdomen was performed. Heavily T2-weighted images of the biliary and pancreatic ducts were obtained, and three-dimensional MRCP images were rendered by post processing. COMPARISON:  Multiple exams, including 03/26/2015 CT scan and prior abdominal MRI from 02/26/2015 FINDINGS: Despite efforts by the technologist and patient, motion artifact is present on today's exam and could not be eliminated. This reduces exam sensitivity and specificity. Lower chest: Bilateral pleural effusions with associated atelectasis. Cannot exclude pneumonia in the left lower lobe. Hepatobiliary: Prominently enlarged left hepatic lobe  and shrunken right hepatic lobe. Layering gallstones in the gallbladder, images 20 through 26 of series 5, generally in the 2-4 mm range. No biliary dilatation or specific signs of choledocholithiasis, the common bile duct is only 3 mm in diameter. Arterial phase accentuated enhancement in the right hepatic lobe and to a lesser extent laterally in the left hepatic lobe lateral segment is likely from fibrosis, and persists on delayed images especially in the right hepatic lobe. Signal dropout in the liver indicate steatosis. Against this background, I do not observe a focal lesion of special concern for hepatocellular carcinoma. Pancreas: The pancreas is atrophic. Concern was raised on the prior MRI for the possibility of pancreatic malignancy but the appearance on today' s exam could certainly be explained by pancreatitis, and the dorsal pancreatic duct in the tail of the pancreas no longer appears dilated as it did before. There is peripancreatic stranding also tracking around the splenic artery, favoring acute component to the patient's pancreatitis. There is a mildly enlarged porta hepatis lymph nodes and peripancreatic lymph nodes which may be reactive. Spleen: Unremarkable -no splenomegaly.  Adrenals/Urinary Tract: Unremarkable Stomach/Bowel: Somewhat lax ligament of Treitz ; the duodenum crosses midline but does not extend up to the level of the do duodenal bulb. Vascular/Lymphatic: Stable mildly enlarged porta hepatis and peripancreatic lymph nodes as shown on prior exams, likely reactive. Other: No supplemental non-categorized findings. Musculoskeletal: Levoconvex lumbar scoliosis with rotary component. The distal cord appears split on image 15 series 6, compatible with diastematomyelia. This has been previously shown for example on 03/11/2009 lumbar spine MRI. IMPRESSION: 1. Acute on chronic pancreatitis. Currently there is no specific findings of pancreatic mass, and the dorsal pancreatic duct is not  dilated in the pancreatic tail as it was on the prior MRI. 2. Hepatic cirrhosis with enlargement the left hepatic lobe, fibrosis of the right hepatic lobe, and hepatic steatosis. Reactive lymph nodes in the porta hepatis and peripancreatic region. 3. Cholelithiasis, without biliary dilatation or evidence of choledocholithiasis. 4. Lax ligament of Treitz. 5. Bilateral pleural effusions with atelectasis. I can't specifically exclude pneumonia in the left lower lobe. 6. Lumbar scoliosis. 7. Chronic/congenital lower spinal cord diastematomyelia. Electronically Signed   By: Gaylyn Rong M.D.   On: 03/29/2015 16:26    CBC  Recent Labs Lab 03/26/15 1109 03/27/15 0512 03/28/15 0503 03/29/15 0418 03/30/15 0548  WBC 5.3 6.0 5.6 4.6 4.5  HGB 11.8* 10.9* 11.1* 10.1* 9.8*  HCT 36.0 33.2* 34.4* 31.5* 31.6*  PLT 158 166 144* 150 158  MCV 95.5 97.9 98.3 98.4 99.4  MCH 31.3 32.2 31.7 31.6 30.8  MCHC 32.8 32.8 32.3 32.1 31.0  RDW 13.7 14.0 14.1 13.8 14.0    Chemistries   Recent Labs Lab 03/26/15 1109 03/26/15 1513 03/27/15 0512 03/28/15 0503 03/29/15 0418 03/30/15 0548  NA 143 142 135 136 136 135  K 2.8* 3.5 4.1 4.3 3.4* 3.7  CL 105 103 103 105 103 107  CO2 20*  GLUCOSE 135* 116* 112* 86 85 87  BUN 7 8 <5* <5* <5* <5*  CREATININE 0.77 0.81 0.78 0.78 0.80 0.82  CALCIUM 9.0 8.9 7.6* 7.8* 7.6* 7.4*  MG 0.8* 1.9  --   --   --   --   AST 134*  --  128* 65* 40 32  ALT 30  --  46 37 28 20  ALKPHOS 178*  --  170* 159* 152* 152*  BILITOT 1.3*  --  4.3* 5.7* 6.2* 5.0*   ------------------------------------------------------------------------------------------------------------------ estimated creatinine clearance is 54.4 mL/min (by C-G formula based on Cr of 0.82). ------------------------------------------------------------------------------------------------------------------ No results for input(s): HGBA1C in the last 72  hours. ------------------------------------------------------------------------------------------------------------------ No results for input(s): CHOL, HDL, LDLCALC, TRIG, CHOLHDL, LDLDIRECT in the last 72 hours. ------------------------------------------------------------------------------------------------------------------ No results for input(s): TSH, T4TOTAL, T3FREE, THYROIDAB in the last 72 hours.  Invalid input(s): FREET3 ------------------------------------------------------------------------------------------------------------------ No results for input(s): VITAMINB12, FOLATE, FERRITIN, TIBC, IRON, RETICCTPCT in the last 72 hours.  Coagulation profile No results for input(s): INR, PROTIME in the last 168 hours.  No results for input(s): DDIMER in the last 72 hours.  Cardiac Enzymes  Recent Labs Lab 03/26/15 1109  TROPONINI <0.03   ------------------------------------------------------------------------------------------------------------------ Invalid input(s): POCBNP  No results for input(s): GLUCAP in the last 72 hours.   Gurley Climer M.D. [facility] 03/30/2015, 12:31 PM  Pager: [phone] Between 7am to 7pm - call Pager - [phone]  After 7pm go to [email or web address] - password [email or web address]  Call night coverage person covering after 7pm

## 2015-03-30 NOTE — Care Management Note (Signed)
Case Management Note  Patient Details  Name: Kathryn [name] MRN: [id number] Date of Birth: 1951-03-08  Subjective/Objective:                  Date-03-30-15 Initial Assessment Spoke with patient over the phone.  Introduced self as Sports coachcase manager and explained role in discharge planning and how to be reached.  Verified patient anticipates to go home  at time of discharge.  Patient has DME cane walker, rolator, wheelchair, tub bench, 3in1.  Expressed potential need for no other DME.  Patient denied  needing help with their medication.  [facility] Plan: CM will continue to follow for discharge planning and [facility].   Lawerance Sabalebbie Michol Emory RN BSN CM [phone]   Action/Plan:  [facility], referral placed to [facility]onj[facility]   Expected Discharge Date:                  Expected Discharge Plan:  Home w Home Health Services  In-House Referral:     Discharge planning Services  CM Consult  Post Acute Care Choice:    Choice offered to:     DME Arranged:    [facility]  [facility]  [facility]     Status of Service:  In process, will continue to follow  Medicare Important Message Given:  Yes Date Medicare IM Given:    Medicare IM give by:    Date Additional Medicare IM Given:    Additional Medicare Important Message give by:     If discussed at Long Length of Stay Meetings, dates discussed:    Additional Comments:  Lawerance SabalDebbie Celicia Minahan, RN 03/30/2015, 12:05 PM

## 2015-03-30 NOTE — Progress Notes (Signed)
[facility] Progress Note   Subjective: Doing better in regards abdominal pain, tolerating advancement of diet.  Objective: Vital signs in last 24 hours: Temp:  [97.3 F (36.3 C)-98.5 F (36.9 C)] 98.5 F (36.9 C) (12/12 0515) Pulse Rate:  [72-83] 83 (12/12 0515) Resp:  [16-19] 16 (12/12 0515) BP: (99-106)/(64-76) 106/76 mmHg (12/12 0515) SpO2:  [99 %-100 %] 100 % (12/12 0515) Weight change:    PE: Abdomen soft nontender  Lab Results: Results for orders placed or performed during the hospital encounter of 03/26/15 (from the past 24 hour(s))  Procalcitonin     Status: None   Collection Time: 03/30/15  5:48 AM  Result Value Ref Range   Procalcitonin 0.45 ng/mL  Comprehensive metabolic panel     Status: Abnormal   Collection Time: 03/30/15  5:48 AM  Result Value Ref Range   Sodium 135 135 - 145 mmol/L   Potassium 3.7 3.5 - 5.1 mmol/L   Chloride 107 101 - 111 mmol/L   CO2 20 (L) 22 - 32 mmol/L   Glucose, Bld 87 65 - 99 mg/dL   BUN <5 (L) 6 - 20 mg/dL   Creatinine, Ser 1.61 0.44 - 1.00 mg/dL   Calcium 7.4 (L) 8.9 - 10.3 mg/dL   Total Protein 4.4 (L) 6.5 - 8.1 g/dL   Albumin 2.0 (L) 3.5 - 5.0 g/dL   AST 32 15 - 41 U/L   ALT 20 14 - 54 U/L   Alkaline Phosphatase 152 (H) 38 - 126 U/L   Total Bilirubin 5.0 (H) 0.3 - 1.2 mg/dL   GFR calc non Af Amer >60 >60 mL/min   GFR calc Af Amer >60 >60 mL/min   Anion gap 8 5 - 15  CBC     Status: Abnormal   Collection Time: 03/30/15  5:48 AM  Result Value Ref Range   WBC 4.5 4.0 - 10.5 K/uL   RBC 3.18 (L) 3.87 - 5.11 MIL/uL   Hemoglobin 9.8 (L) 12.0 - 15.0 g/dL   HCT 09.6 (L) 04.5 - 40.9 %   MCV 99.4 78.0 - 100.0 fL   MCH 30.8 26.0 - 34.0 pg   MCHC 31.0 30.0 - 36.0 g/dL   RDW 81.1 91.4 - 78.2 %   Platelets 158 150 - 400 K/uL    Studies/Results: Mr 3d Recon At Scanner  03/29/2015  CLINICAL DATA:  Cirrhosis, chronic pancreatitis. Abdominal pain and vomiting. Acute on chronic pancreatitis. Elevated liver enzymes.  EXAM: MRI ABDOMEN WITHOUT CONTRAST  (INCLUDING MRCP) TECHNIQUE: Multiplanar multisequence MR imaging of the abdomen was performed. Heavily T2-weighted images of the biliary and pancreatic ducts were obtained, and three-dimensional MRCP images were rendered by post processing. COMPARISON:  Multiple exams, including 03/26/2015 CT scan and prior abdominal MRI from 02/26/2015 FINDINGS: Despite efforts by the technologist and patient, motion artifact is present on today's exam and could not be eliminated. This reduces exam sensitivity and specificity. Lower chest: Bilateral pleural effusions with associated atelectasis. Cannot exclude pneumonia in the left lower lobe. Hepatobiliary: Prominently enlarged left hepatic lobe and shrunken right hepatic lobe. Layering gallstones in the gallbladder, images 20 through 26 of series 5, generally in the 2-4 mm range. No biliary dilatation or specific signs of choledocholithiasis, the common bile duct is only 3 mm in diameter. Arterial phase accentuated enhancement in the right hepatic lobe and to a lesser extent laterally in the left hepatic lobe lateral segment is likely from fibrosis, and persists on delayed images especially in the right hepatic  lobe. Signal dropout in the liver indicate steatosis. Against this background, I do not observe a focal lesion of special concern for hepatocellular carcinoma. Pancreas: The pancreas is atrophic. Concern was raised on the prior MRI for the possibility of pancreatic malignancy but the appearance on today' s exam could certainly be explained by pancreatitis, and the dorsal pancreatic duct in the tail of the pancreas no longer appears dilated as it did before. There is peripancreatic stranding also tracking around the splenic artery, favoring acute component to the patient's pancreatitis. There is a mildly enlarged porta hepatis lymph nodes and peripancreatic lymph nodes which may be reactive. Spleen: Unremarkable -no splenomegaly.  Adrenals/Urinary Tract: Unremarkable Stomach/Bowel: Somewhat lax ligament of Treitz ; the duodenum crosses midline but does not extend up to the level of the do duodenal bulb. Vascular/Lymphatic: Stable mildly enlarged porta hepatis and peripancreatic lymph nodes as shown on prior exams, likely reactive. Other: No supplemental non-categorized findings. Musculoskeletal: Levoconvex lumbar scoliosis with rotary component. The distal cord appears split on image 15 series 6, compatible with diastematomyelia. This has been previously shown for example on 03/11/2009 lumbar spine MRI. IMPRESSION: 1. Acute on chronic pancreatitis. Currently there is no specific findings of pancreatic mass, and the dorsal pancreatic duct is not dilated in the pancreatic tail as it was on the prior MRI. 2. Hepatic cirrhosis with enlargement the left hepatic lobe, fibrosis of the right hepatic lobe, and hepatic steatosis. Reactive lymph nodes in the porta hepatis and peripancreatic region. 3. Cholelithiasis, without biliary dilatation or evidence of choledocholithiasis. 4. Lax ligament of Treitz. 5. Bilateral pleural effusions with atelectasis. I can't specifically exclude pneumonia in the left lower lobe. 6. Lumbar scoliosis. 7. Chronic/congenital lower spinal cord diastematomyelia. Electronically Signed   By: Gaylyn Rong M.D.   On: 03/29/2015 16:26   Mr Jorja Loa Cm/mrcp  03/29/2015  CLINICAL DATA:  Cirrhosis, chronic pancreatitis. Abdominal pain and vomiting. Acute on chronic pancreatitis. Elevated liver enzymes. EXAM: MRI ABDOMEN WITHOUT CONTRAST  (INCLUDING MRCP) TECHNIQUE: Multiplanar multisequence MR imaging of the abdomen was performed. Heavily T2-weighted images of the biliary and pancreatic ducts were obtained, and three-dimensional MRCP images were rendered by post processing. COMPARISON:  Multiple exams, including 03/26/2015 CT scan and prior abdominal MRI from 02/26/2015 FINDINGS: Despite efforts by the technologist and  patient, motion artifact is present on today's exam and could not be eliminated. This reduces exam sensitivity and specificity. Lower chest: Bilateral pleural effusions with associated atelectasis. Cannot exclude pneumonia in the left lower lobe. Hepatobiliary: Prominently enlarged left hepatic lobe and shrunken right hepatic lobe. Layering gallstones in the gallbladder, images 20 through 26 of series 5, generally in the 2-4 mm range. No biliary dilatation or specific signs of choledocholithiasis, the common bile duct is only 3 mm in diameter. Arterial phase accentuated enhancement in the right hepatic lobe and to a lesser extent laterally in the left hepatic lobe lateral segment is likely from fibrosis, and persists on delayed images especially in the right hepatic lobe. Signal dropout in the liver indicate steatosis. Against this background, I do not observe a focal lesion of special concern for hepatocellular carcinoma. Pancreas: The pancreas is atrophic. Concern was raised on the prior MRI for the possibility of pancreatic malignancy but the appearance on today' s exam could certainly be explained by pancreatitis, and the dorsal pancreatic duct in the tail of the pancreas no longer appears dilated as it did before. There is peripancreatic stranding also tracking around the splenic artery, favoring acute component  to the patient's pancreatitis. There is a mildly enlarged porta hepatis lymph nodes and peripancreatic lymph nodes which may be reactive. Spleen: Unremarkable -no splenomegaly. Adrenals/Urinary Tract: Unremarkable Stomach/Bowel: Somewhat lax ligament of Treitz ; the duodenum crosses midline but does not extend up to the level of the do duodenal bulb. Vascular/Lymphatic: Stable mildly enlarged porta hepatis and peripancreatic lymph nodes as shown on prior exams, likely reactive. Other: No supplemental non-categorized findings. Musculoskeletal: Levoconvex lumbar scoliosis with rotary component. The distal  cord appears split on image 15 series 6, compatible with diastematomyelia. This has been previously shown for example on 03/11/2009 lumbar spine MRI. IMPRESSION: 1. Acute on chronic pancreatitis. Currently there is no specific findings of pancreatic mass, and the dorsal pancreatic duct is not dilated in the pancreatic tail as it was on the prior MRI. 2. Hepatic cirrhosis with enlargement the left hepatic lobe, fibrosis of the right hepatic lobe, and hepatic steatosis. Reactive lymph nodes in the porta hepatis and peripancreatic region. 3. Cholelithiasis, without biliary dilatation or evidence of choledocholithiasis. 4. Lax ligament of Treitz. 5. Bilateral pleural effusions with atelectasis. I can't specifically exclude pneumonia in the left lower lobe. 6. Lumbar scoliosis. 7. Chronic/congenital lower spinal cord diastematomyelia. Electronically Signed   By: Gaylyn RongWalter  Liebkemann M.D.   On: 03/29/2015 16:26      Assessment: 1. Cholecystitis 2. No areas of choledocholithiasis by MRCP Cirrhosis, secondary to alcohol  Plan: 1. No indication for ERCP 2. Advance diet as tolerated 3. Continue antibiotics, , consider changing to non-beta-lactam antibiotics as per Dr. Dulce Sellaroutlaw suggestion if drug-induced intrahepatic cholestasis suspected.    Allexa Acoff C 03/30/2015, 11:26 AM  Pager [phone] If no answer or after 5 PM call [phone]

## 2015-03-30 NOTE — Progress Notes (Signed)
Subjective: Still with some abdominal discomfort, but improving  Objective: Vital signs in last 24 hours: Temp:  [97.3 F (36.3 C)-98.5 F (36.9 C)] 98.5 F (36.9 C) (12/12 0515) Pulse Rate:  [72-83] 83 (12/12 0515) Resp:  [16-19] 16 (12/12 0515) BP: (99-120)/(64-76) 106/76 mmHg (12/12 0515) SpO2:  [99 %-100 %] 100 % (12/12 0515) Last BM Date: 03/26/15  Intake/Output from previous day: 12/11 0701 - 12/12 0700 In: 3737.5 [P.O.:650; I.V.:2937.5; IV Piggyback:150] Out: 2150 [Urine:2150] Intake/Output this shift: Total I/O In: 320.8 [I.V.:320.8] Out: 200 [Urine:200]  Abdomen with very mild RUQ tenderness  Lab Results:   Recent Labs  03/29/15 0418 03/30/15 0548  WBC 4.6 4.5  HGB 10.1* 9.8*  HCT 31.5* 31.6*  PLT 150 158   BMET  Recent Labs  03/29/15 0418 03/30/15 0548  NA 136 135  K 3.4* 3.7  CL 103 107  CO2 24 20*  GLUCOSE 85 87  BUN <5* <5*  CREATININE 0.80 0.82  CALCIUM 7.6* 7.4*   PT/INR No results for input(s): LABPROT, INR in the last 72 hours. ABG No results for input(s): PHART, HCO3 in the last 72 hours.  Invalid input(s): PCO2, PO2  Studies/Results: Mr 3d Recon At Scanner  03/29/2015  CLINICAL DATA:  Cirrhosis, chronic pancreatitis. Abdominal pain and vomiting. Acute on chronic pancreatitis. Elevated liver enzymes. EXAM: MRI ABDOMEN WITHOUT CONTRAST  (INCLUDING MRCP) TECHNIQUE: Multiplanar multisequence MR imaging of the abdomen was performed. Heavily T2-weighted images of the biliary and pancreatic ducts were obtained, and three-dimensional MRCP images were rendered by post processing. COMPARISON:  Multiple exams, including 03/26/2015 CT scan and prior abdominal MRI from 02/26/2015 FINDINGS: Despite efforts by the technologist and patient, motion artifact is present on today's exam and could not be eliminated. This reduces exam sensitivity and specificity. Lower chest: Bilateral pleural effusions with associated atelectasis. Cannot exclude  pneumonia in the left lower lobe. Hepatobiliary: Prominently enlarged left hepatic lobe and shrunken right hepatic lobe. Layering gallstones in the gallbladder, images 20 through 26 of series 5, generally in the 2-4 mm range. No biliary dilatation or specific signs of choledocholithiasis, the common bile duct is only 3 mm in diameter. Arterial phase accentuated enhancement in the right hepatic lobe and to a lesser extent laterally in the left hepatic lobe lateral segment is likely from fibrosis, and persists on delayed images especially in the right hepatic lobe. Signal dropout in the liver indicate steatosis. Against this background, I do not observe a focal lesion of special concern for hepatocellular carcinoma. Pancreas: The pancreas is atrophic. Concern was raised on the prior MRI for the possibility of pancreatic malignancy but the appearance on today' s exam could certainly be explained by pancreatitis, and the dorsal pancreatic duct in the tail of the pancreas no longer appears dilated as it did before. There is peripancreatic stranding also tracking around the splenic artery, favoring acute component to the patient's pancreatitis. There is a mildly enlarged porta hepatis lymph nodes and peripancreatic lymph nodes which may be reactive. Spleen: Unremarkable -no splenomegaly. Adrenals/Urinary Tract: Unremarkable Stomach/Bowel: Somewhat lax ligament of Treitz ; the duodenum crosses midline but does not extend up to the level of the do duodenal bulb. Vascular/Lymphatic: Stable mildly enlarged porta hepatis and peripancreatic lymph nodes as shown on prior exams, likely reactive. Other: No supplemental non-categorized findings. Musculoskeletal: Levoconvex lumbar scoliosis with rotary component. The distal cord appears split on image 15 series 6, compatible with diastematomyelia. This has been previously shown for example on 03/11/2009 lumbar spine MRI.  IMPRESSION: 1. Acute on chronic pancreatitis. Currently there  is no specific findings of pancreatic mass, and the dorsal pancreatic duct is not dilated in the pancreatic tail as it was on the prior MRI. 2. Hepatic cirrhosis with enlargement the left hepatic lobe, fibrosis of the right hepatic lobe, and hepatic steatosis. Reactive lymph nodes in the porta hepatis and peripancreatic region. 3. Cholelithiasis, without biliary dilatation or evidence of choledocholithiasis. 4. Lax ligament of Treitz. 5. Bilateral pleural effusions with atelectasis. I can't specifically exclude pneumonia in the left lower lobe. 6. Lumbar scoliosis. 7. Chronic/congenital lower spinal cord diastematomyelia. Electronically Signed   By: Gaylyn Rong M.D.   On: 03/29/2015 16:26   Mr Jorja Loa Cm/mrcp  03/29/2015  CLINICAL DATA:  Cirrhosis, chronic pancreatitis. Abdominal pain and vomiting. Acute on chronic pancreatitis. Elevated liver enzymes. EXAM: MRI ABDOMEN WITHOUT CONTRAST  (INCLUDING MRCP) TECHNIQUE: Multiplanar multisequence MR imaging of the abdomen was performed. Heavily T2-weighted images of the biliary and pancreatic ducts were obtained, and three-dimensional MRCP images were rendered by post processing. COMPARISON:  Multiple exams, including 03/26/2015 CT scan and prior abdominal MRI from 02/26/2015 FINDINGS: Despite efforts by the technologist and patient, motion artifact is present on today's exam and could not be eliminated. This reduces exam sensitivity and specificity. Lower chest: Bilateral pleural effusions with associated atelectasis. Cannot exclude pneumonia in the left lower lobe. Hepatobiliary: Prominently enlarged left hepatic lobe and shrunken right hepatic lobe. Layering gallstones in the gallbladder, images 20 through 26 of series 5, generally in the 2-4 mm range. No biliary dilatation or specific signs of choledocholithiasis, the common bile duct is only 3 mm in diameter. Arterial phase accentuated enhancement in the right hepatic lobe and to a lesser extent laterally in  the left hepatic lobe lateral segment is likely from fibrosis, and persists on delayed images especially in the right hepatic lobe. Signal dropout in the liver indicate steatosis. Against this background, I do not observe a focal lesion of special concern for hepatocellular carcinoma. Pancreas: The pancreas is atrophic. Concern was raised on the prior MRI for the possibility of pancreatic malignancy but the appearance on today' s exam could certainly be explained by pancreatitis, and the dorsal pancreatic duct in the tail of the pancreas no longer appears dilated as it did before. There is peripancreatic stranding also tracking around the splenic artery, favoring acute component to the patient's pancreatitis. There is a mildly enlarged porta hepatis lymph nodes and peripancreatic lymph nodes which may be reactive. Spleen: Unremarkable -no splenomegaly. Adrenals/Urinary Tract: Unremarkable Stomach/Bowel: Somewhat lax ligament of Treitz ; the duodenum crosses midline but does not extend up to the level of the do duodenal bulb. Vascular/Lymphatic: Stable mildly enlarged porta hepatis and peripancreatic lymph nodes as shown on prior exams, likely reactive. Other: No supplemental non-categorized findings. Musculoskeletal: Levoconvex lumbar scoliosis with rotary component. The distal cord appears split on image 15 series 6, compatible with diastematomyelia. This has been previously shown for example on 03/11/2009 lumbar spine MRI. IMPRESSION: 1. Acute on chronic pancreatitis. Currently there is no specific findings of pancreatic mass, and the dorsal pancreatic duct is not dilated in the pancreatic tail as it was on the prior MRI. 2. Hepatic cirrhosis with enlargement the left hepatic lobe, fibrosis of the right hepatic lobe, and hepatic steatosis. Reactive lymph nodes in the porta hepatis and peripancreatic region. 3. Cholelithiasis, without biliary dilatation or evidence of choledocholithiasis. 4. Lax ligament of Treitz.  5. Bilateral pleural effusions with atelectasis. I can't specifically exclude  pneumonia in the left lower lobe. 6. Lumbar scoliosis. 7. Chronic/congenital lower spinal cord diastematomyelia. Electronically Signed   By: Gaylyn Rong M.D.   On: 03/29/2015 16:26    Anti-infectives: Anti-infectives    Start     Dose/Rate Route Frequency Ordered Stop   03/27/15 0830  piperacillin-tazobactam (ZOSYN) IVPB 3.375 g     3.375 g 12.5 mL/hr over 240 Minutes Intravenous Every 8 hours 03/27/15 0826        Assessment/Plan: s/p * No surgery found *  Cholelithiasis with cirrhosis and chronic pancreatitis, slowly improving  Will allow full liquids.  Continue antibiotics.  Advance to full liquid diet  LOS: 4 days    Delsy Etzkorn A 03/30/2015

## 2015-03-31 LAB — CBC
HCT: 31.2 % — ABNORMAL LOW (ref 36.0–46.0)
Hemoglobin: 9.8 g/dL — ABNORMAL LOW (ref 12.0–15.0)
MCH: 31.1 pg (ref 26.0–34.0)
MCHC: 31.4 g/dL (ref 30.0–36.0)
MCV: 99 fL (ref 78.0–100.0)
PLATELETS: 159 10*3/uL (ref 150–400)
RBC: 3.15 MIL/uL — ABNORMAL LOW (ref 3.87–5.11)
RDW: 14.4 % (ref 11.5–15.5)
WBC: 4.5 10*3/uL (ref 4.0–10.5)

## 2015-03-31 LAB — COMPREHENSIVE METABOLIC PANEL
ALT: 19 U/L (ref 14–54)
AST: 34 U/L (ref 15–41)
Albumin: 2 g/dL — ABNORMAL LOW (ref 3.5–5.0)
Alkaline Phosphatase: 180 U/L — ABNORMAL HIGH (ref 38–126)
Anion gap: 8 (ref 5–15)
BUN: <5 mg/dL — ABNORMAL LOW (ref 6–20)
CHLORIDE: 104 mmol/L (ref 101–111)
CO2: 22 mmol/L (ref 22–32)
CREATININE: 0.85 mg/dL (ref 0.44–1.00)
Calcium: 7.7 mg/dL — ABNORMAL LOW (ref 8.9–10.3)
Glucose, Bld: 97 mg/dL (ref 65–99)
Potassium: 3.5 mmol/L (ref 3.5–5.1)
Sodium: 134 mmol/L — ABNORMAL LOW (ref 135–145)
Total Bilirubin: 3.7 mg/dL — ABNORMAL HIGH (ref 0.3–1.2)
Total Protein: 4.6 g/dL — ABNORMAL LOW (ref 6.5–8.1)

## 2015-03-31 MED ORDER — POTASSIUM CHLORIDE CRYS ER 20 MEQ PO TBCR
40.0000 meq | EXTENDED_RELEASE_TABLET | Freq: Once | ORAL | Status: AC
  Administered 2015-03-31: 40 meq via ORAL
  Filled 2015-03-31: qty 2

## 2015-03-31 NOTE — Progress Notes (Signed)
[facility] Progress Note  Subjective: Tolerating advancement of diet fairly well, no new complaints  Objective: Vital signs in last 24 hours: Temp:  [98.2 F (36.8 C)-98.8 F (37.1 C)] 98.4 F (36.9 C) (12/13 0538) Pulse Rate:  [77-87] 87 (12/13 0538) Resp:  [16-20] 16 (12/13 0538) BP: (106-107)/(73-81) 107/76 mmHg (12/13 0538) SpO2:  [97 %-100 %] 97 % (12/13 0538) Weight change:    PE: Unchanged  Lab Results: Results for orders placed or performed during the hospital encounter of 03/26/15 (from the past 24 hour(s))  Comprehensive metabolic panel     Status: Abnormal   Collection Time: 03/31/15  7:05 AM  Result Value Ref Range   Sodium 134 (L) 135 - 145 mmol/L   Potassium 3.5 3.5 - 5.1 mmol/L   Chloride 104 101 - 111 mmol/L   CO2 22 22 - 32 mmol/L   Glucose, Bld 97 65 - 99 mg/dL   BUN <5 (L) 6 - 20 mg/dL   Creatinine, Ser 1.61 0.44 - 1.00 mg/dL   Calcium 7.7 (L) 8.9 - 10.3 mg/dL   Total Protein 4.6 (L) 6.5 - 8.1 g/dL   Albumin 2.0 (L) 3.5 - 5.0 g/dL   AST 34 15 - 41 U/L   ALT 19 14 - 54 U/L   Alkaline Phosphatase 180 (H) 38 - 126 U/L   Total Bilirubin 3.7 (H) 0.3 - 1.2 mg/dL   GFR calc non Af Amer >60 >60 mL/min   GFR calc Af Amer >60 >60 mL/min   Anion gap 8 5 - 15  CBC     Status: Abnormal   Collection Time: 03/31/15  7:05 AM  Result Value Ref Range   WBC 4.5 4.0 - 10.5 K/uL   RBC 3.15 (L) 3.87 - 5.11 MIL/uL   Hemoglobin 9.8 (L) 12.0 - 15.0 g/dL   HCT 09.6 (L) 04.5 - 40.9 %   MCV 99.0 78.0 - 100.0 fL   MCH 31.1 26.0 - 34.0 pg   MCHC 31.4 30.0 - 36.0 g/dL   RDW 81.1 91.4 - 78.2 %   Platelets 159 150 - 400 K/uL    Studies/Results: Mr 3d Recon At Scanner  03/29/2015  CLINICAL DATA:  Cirrhosis, chronic pancreatitis. Abdominal pain and vomiting. Acute on chronic pancreatitis. Elevated liver enzymes. EXAM: MRI ABDOMEN WITHOUT CONTRAST  (INCLUDING MRCP) TECHNIQUE: Multiplanar multisequence MR imaging of the abdomen was performed. Heavily T2-weighted  images of the biliary and pancreatic ducts were obtained, and three-dimensional MRCP images were rendered by post processing. COMPARISON:  Multiple exams, including 03/26/2015 CT scan and prior abdominal MRI from 02/26/2015 FINDINGS: Despite efforts by the technologist and patient, motion artifact is present on today's exam and could not be eliminated. This reduces exam sensitivity and specificity. Lower chest: Bilateral pleural effusions with associated atelectasis. Cannot exclude pneumonia in the left lower lobe. Hepatobiliary: Prominently enlarged left hepatic lobe and shrunken right hepatic lobe. Layering gallstones in the gallbladder, images 20 through 26 of series 5, generally in the 2-4 mm range. No biliary dilatation or specific signs of choledocholithiasis, the common bile duct is only 3 mm in diameter. Arterial phase accentuated enhancement in the right hepatic lobe and to a lesser extent laterally in the left hepatic lobe lateral segment is likely from fibrosis, and persists on delayed images especially in the right hepatic lobe. Signal dropout in the liver indicate steatosis. Against this background, I do not observe a focal lesion of special concern for hepatocellular carcinoma. Pancreas: The pancreas is atrophic. Concern  was raised on the prior MRI for the possibility of pancreatic malignancy but the appearance on today' s exam could certainly be explained by pancreatitis, and the dorsal pancreatic duct in the tail of the pancreas no longer appears dilated as it did before. There is peripancreatic stranding also tracking around the splenic artery, favoring acute component to the patient's pancreatitis. There is a mildly enlarged porta hepatis lymph nodes and peripancreatic lymph nodes which may be reactive. Spleen: Unremarkable -no splenomegaly. Adrenals/Urinary Tract: Unremarkable Stomach/Bowel: Somewhat lax ligament of Treitz ; the duodenum crosses midline but does not extend up to the level of the  do duodenal bulb. Vascular/Lymphatic: Stable mildly enlarged porta hepatis and peripancreatic lymph nodes as shown on prior exams, likely reactive. Other: No supplemental non-categorized findings. Musculoskeletal: Levoconvex lumbar scoliosis with rotary component. The distal cord appears split on image 15 series 6, compatible with diastematomyelia. This has been previously shown for example on 03/11/2009 lumbar spine MRI. IMPRESSION: 1. Acute on chronic pancreatitis. Currently there is no specific findings of pancreatic mass, and the dorsal pancreatic duct is not dilated in the pancreatic tail as it was on the prior MRI. 2. Hepatic cirrhosis with enlargement the left hepatic lobe, fibrosis of the right hepatic lobe, and hepatic steatosis. Reactive lymph nodes in the porta hepatis and peripancreatic region. 3. Cholelithiasis, without biliary dilatation or evidence of choledocholithiasis. 4. Lax ligament of Treitz. 5. Bilateral pleural effusions with atelectasis. I can't specifically exclude pneumonia in the left lower lobe. 6. Lumbar scoliosis. 7. Chronic/congenital lower spinal cord diastematomyelia. Electronically Signed   By: Gaylyn Rong M.D.   On: 03/29/2015 16:26   Mr Jorja Loa Cm/mrcp  03/29/2015  CLINICAL DATA:  Cirrhosis, chronic pancreatitis. Abdominal pain and vomiting. Acute on chronic pancreatitis. Elevated liver enzymes. EXAM: MRI ABDOMEN WITHOUT CONTRAST  (INCLUDING MRCP) TECHNIQUE: Multiplanar multisequence MR imaging of the abdomen was performed. Heavily T2-weighted images of the biliary and pancreatic ducts were obtained, and three-dimensional MRCP images were rendered by post processing. COMPARISON:  Multiple exams, including 03/26/2015 CT scan and prior abdominal MRI from 02/26/2015 FINDINGS: Despite efforts by the technologist and patient, motion artifact is present on today's exam and could not be eliminated. This reduces exam sensitivity and specificity. Lower chest: Bilateral pleural  effusions with associated atelectasis. Cannot exclude pneumonia in the left lower lobe. Hepatobiliary: Prominently enlarged left hepatic lobe and shrunken right hepatic lobe. Layering gallstones in the gallbladder, images 20 through 26 of series 5, generally in the 2-4 mm range. No biliary dilatation or specific signs of choledocholithiasis, the common bile duct is only 3 mm in diameter. Arterial phase accentuated enhancement in the right hepatic lobe and to a lesser extent laterally in the left hepatic lobe lateral segment is likely from fibrosis, and persists on delayed images especially in the right hepatic lobe. Signal dropout in the liver indicate steatosis. Against this background, I do not observe a focal lesion of special concern for hepatocellular carcinoma. Pancreas: The pancreas is atrophic. Concern was raised on the prior MRI for the possibility of pancreatic malignancy but the appearance on today' s exam could certainly be explained by pancreatitis, and the dorsal pancreatic duct in the tail of the pancreas no longer appears dilated as it did before. There is peripancreatic stranding also tracking around the splenic artery, favoring acute component to the patient's pancreatitis. There is a mildly enlarged porta hepatis lymph nodes and peripancreatic lymph nodes which may be reactive. Spleen: Unremarkable -no splenomegaly. Adrenals/Urinary Tract: Unremarkable Stomach/Bowel: Somewhat  lax ligament of Treitz ; the duodenum crosses midline but does not extend up to the level of the do duodenal bulb. Vascular/Lymphatic: Stable mildly enlarged porta hepatis and peripancreatic lymph nodes as shown on prior exams, likely reactive. Other: No supplemental non-categorized findings. Musculoskeletal: Levoconvex lumbar scoliosis with rotary component. The distal cord appears split on image 15 series 6, compatible with diastematomyelia. This has been previously shown for example on 03/11/2009 lumbar spine MRI.  IMPRESSION: 1. Acute on chronic pancreatitis. Currently there is no specific findings of pancreatic mass, and the dorsal pancreatic duct is not dilated in the pancreatic tail as it was on the prior MRI. 2. Hepatic cirrhosis with enlargement the left hepatic lobe, fibrosis of the right hepatic lobe, and hepatic steatosis. Reactive lymph nodes in the porta hepatis and peripancreatic region. 3. Cholelithiasis, without biliary dilatation or evidence of choledocholithiasis. 4. Lax ligament of Treitz. 5. Bilateral pleural effusions with atelectasis. I can't specifically exclude pneumonia in the left lower lobe. 6. Lumbar scoliosis. 7. Chronic/congenital lower spinal cord diastematomyelia. Electronically Signed   By: Gaylyn RongWalter  Liebkemann M.D.   On: 03/29/2015 16:26      Assessment: 1. Cholelithiasis with possible cholecystitis 2. Await liver function tests improving, suspect secondary to alcohol 3.   Plan: 1. Management of gallstones per general surgery 2. Advance diet as tolerated 3. Will sign off for now.    Nevyn Bossman C 03/31/2015, 9:56 AM  Pager [phone] If no answer or after 5 PM call [phone]

## 2015-03-31 NOTE — Progress Notes (Signed)
Subjective: Had more pain with fulls yesterday.   Objective: Vital signs in last 24 hours: Temp:  [98.2 F (36.8 C)-98.8 F (37.1 C)] 98.4 F (36.9 C) (12/13 0538) Pulse Rate:  [77-87] 87 (12/13 0538) Resp:  [16-20] 16 (12/13 0538) BP: (106-107)/(73-81) 107/76 mmHg (12/13 0538) SpO2:  [97 %-100 %] 97 % (12/13 0538) Last BM Date: 03/26/15 PO 120 recorded  Full liquids Urine 2575 No BM recorded  Last BM 03/26/15 Afebrile, VSS Labs show Bilirubin is down some, WBC is normal.   Intake/Output from previous day: 12/12 0701 - 12/13 0700 In: 3361.7 [P.O.:120; I.V.:3141.7; IV Piggyback:100] Out: 2575 [Urine:2575] Intake/Output this shift: Total I/O In: -  Out: 400 [Urine:400]  General appearance: alert and cooperative Resp: clear to auscultation bilaterally GI: soft, minimal tenderness RUQ  Lab Results:   Recent Labs  03/30/15 0548 03/31/15 0705  WBC 4.5 4.5  HGB 9.8* 9.8*  HCT 31.6* 31.2*  PLT 158 159    BMET  Recent Labs  03/30/15 0548 03/31/15 0705  NA 135 134*  K 3.7 3.5  CL 107 104  CO2 20* 22  GLUCOSE 87 97  BUN <5* <5*  CREATININE 0.82 0.85  CALCIUM 7.4* 7.7*   PT/INR No results for input(s): LABPROT, INR in the last 72 hours.   Recent Labs Lab 03/27/15 0512 03/28/15 0503 03/29/15 0418 03/30/15 0548 03/31/15 0705  AST 128* 65* 40 32 34  ALT 46 37 ALKPHOS 170* 159* 152* 152* 180*  BILITOT 4.3* 5.7* 6.2* 5.0* 3.7*  PROT 5.0* 5.1* 4.8* 4.4* 4.6*  ALBUMIN 2.4* 2.3* 2.2* 2.0* 2.0*     Lipase     Component Value Date/Time   LIPASE 16 03/29/2015 0418     Studies/Results: Mr 3d Recon At Scanner  03/29/2015  CLINICAL DATA:  Cirrhosis, chronic pancreatitis. Abdominal pain and vomiting. Acute on chronic pancreatitis. Elevated liver enzymes. EXAM: MRI ABDOMEN WITHOUT CONTRAST  (INCLUDING MRCP) TECHNIQUE: Multiplanar multisequence MR imaging of the abdomen was performed. Heavily T2-weighted images of the biliary and pancreatic  ducts were obtained, and three-dimensional MRCP images were rendered by post processing. COMPARISON:  Multiple exams, including 03/26/2015 CT scan and prior abdominal MRI from 02/26/2015 FINDINGS: Despite efforts by the technologist and patient, motion artifact is present on today's exam and could not be eliminated. This reduces exam sensitivity and specificity. Lower chest: Bilateral pleural effusions with associated atelectasis. Cannot exclude pneumonia in the left lower lobe. Hepatobiliary: Prominently enlarged left hepatic lobe and shrunken right hepatic lobe. Layering gallstones in the gallbladder, images 20 through 26 of series 5, generally in the 2-4 mm range. No biliary dilatation or specific signs of choledocholithiasis, the common bile duct is only 3 mm in diameter. Arterial phase accentuated enhancement in the right hepatic lobe and to a lesser extent laterally in the left hepatic lobe lateral segment is likely from fibrosis, and persists on delayed images especially in the right hepatic lobe. Signal dropout in the liver indicate steatosis. Against this background, I do not observe a focal lesion of special concern for hepatocellular carcinoma. Pancreas: The pancreas is atrophic. Concern was raised on the prior MRI for the possibility of pancreatic malignancy but the appearance on today' s exam could certainly be explained by pancreatitis, and the dorsal pancreatic duct in the tail of the pancreas no longer appears dilated as it did before. There is peripancreatic stranding also tracking around the splenic artery, favoring acute component to the patient's pancreatitis. There is a mildly  enlarged porta hepatis lymph nodes and peripancreatic lymph nodes which may be reactive. Spleen: Unremarkable -no splenomegaly. Adrenals/Urinary Tract: Unremarkable Stomach/Bowel: Somewhat lax ligament of Treitz ; the duodenum crosses midline but does not extend up to the level of the do duodenal bulb. Vascular/Lymphatic:  Stable mildly enlarged porta hepatis and peripancreatic lymph nodes as shown on prior exams, likely reactive. Other: No supplemental non-categorized findings. Musculoskeletal: Levoconvex lumbar scoliosis with rotary component. The distal cord appears split on image 15 series 6, compatible with diastematomyelia. This has been previously shown for example on 03/11/2009 lumbar spine MRI. IMPRESSION: 1. Acute on chronic pancreatitis. Currently there is no specific findings of pancreatic mass, and the dorsal pancreatic duct is not dilated in the pancreatic tail as it was on the prior MRI. 2. Hepatic cirrhosis with enlargement the left hepatic lobe, fibrosis of the right hepatic lobe, and hepatic steatosis. Reactive lymph nodes in the porta hepatis and peripancreatic region. 3. Cholelithiasis, without biliary dilatation or evidence of choledocholithiasis. 4. Lax ligament of Treitz. 5. Bilateral pleural effusions with atelectasis. I can't specifically exclude pneumonia in the left lower lobe. 6. Lumbar scoliosis. 7. Chronic/congenital lower spinal cord diastematomyelia. Electronically Signed   By: Gaylyn RongWalter  Liebkemann M.D.   On: 03/29/2015 16:26   Mr Jorja Loabd W Cm/mrcp  03/29/2015  CLINICAL DATA:  Cirrhosis, chronic pancreatitis. Abdominal pain and vomiting. Acute on chronic pancreatitis. Elevated liver enzymes. EXAM: MRI ABDOMEN WITHOUT CONTRAST  (INCLUDING MRCP) TECHNIQUE: Multiplanar multisequence MR imaging of the abdomen was performed. Heavily T2-weighted images of the biliary and pancreatic ducts were obtained, and three-dimensional MRCP images were rendered by post processing. COMPARISON:  Multiple exams, including 03/26/2015 CT scan and prior abdominal MRI from 02/26/2015 FINDINGS: Despite efforts by the technologist and patient, motion artifact is present on today's exam and could not be eliminated. This reduces exam sensitivity and specificity. Lower chest: Bilateral pleural effusions with associated atelectasis.  Cannot exclude pneumonia in the left lower lobe. Hepatobiliary: Prominently enlarged left hepatic lobe and shrunken right hepatic lobe. Layering gallstones in the gallbladder, images 20 through 26 of series 5, generally in the 2-4 mm range. No biliary dilatation or specific signs of choledocholithiasis, the common bile duct is only 3 mm in diameter. Arterial phase accentuated enhancement in the right hepatic lobe and to a lesser extent laterally in the left hepatic lobe lateral segment is likely from fibrosis, and persists on delayed images especially in the right hepatic lobe. Signal dropout in the liver indicate steatosis. Against this background, I do not observe a focal lesion of special concern for hepatocellular carcinoma. Pancreas: The pancreas is atrophic. Concern was raised on the prior MRI for the possibility of pancreatic malignancy but the appearance on today' s exam could certainly be explained by pancreatitis, and the dorsal pancreatic duct in the tail of the pancreas no longer appears dilated as it did before. There is peripancreatic stranding also tracking around the splenic artery, favoring acute component to the patient's pancreatitis. There is a mildly enlarged porta hepatis lymph nodes and peripancreatic lymph nodes which may be reactive. Spleen: Unremarkable -no splenomegaly. Adrenals/Urinary Tract: Unremarkable Stomach/Bowel: Somewhat lax ligament of Treitz ; the duodenum crosses midline but does not extend up to the level of the do duodenal bulb. Vascular/Lymphatic: Stable mildly enlarged porta hepatis and peripancreatic lymph nodes as shown on prior exams, likely reactive. Other: No supplemental non-categorized findings. Musculoskeletal: Levoconvex lumbar scoliosis with rotary component. The distal cord appears split on image 15 series 6, compatible with diastematomyelia. This  has been previously shown for example on 03/11/2009 lumbar spine MRI. IMPRESSION: 1. Acute on chronic pancreatitis.  Currently there is no specific findings of pancreatic mass, and the dorsal pancreatic duct is not dilated in the pancreatic tail as it was on the prior MRI. 2. Hepatic cirrhosis with enlargement the left hepatic lobe, fibrosis of the right hepatic lobe, and hepatic steatosis. Reactive lymph nodes in the porta hepatis and peripancreatic region. 3. Cholelithiasis, without biliary dilatation or evidence of choledocholithiasis. 4. Lax ligament of Treitz. 5. Bilateral pleural effusions with atelectasis. I can't specifically exclude pneumonia in the left lower lobe. 6. Lumbar scoliosis. 7. Chronic/congenital lower spinal cord diastematomyelia. Electronically Signed   By: Gaylyn Rong M.D.   On: 03/29/2015 16:26    Medications: . allopurinol  100 mg Oral BID  . heparin  5,000 Units Subcutaneous 3 times per day  . pantoprazole  40 mg Oral BID  . piperacillin-tazobactam (ZOSYN)  IV  3.375 g Intravenous Q8H  . sodium chloride  3 mL Intravenous Q12H    Assessment/Plan Cholecystitis Chronic and acute pancreatitis Alcoholic cirrhosis high operative risk Palpitations (V-tach) Hx of MI in setting of septic shock 12/2011 Antibiotics:  Zosyn day 5 DVT:  Heparin/SCD  Continue full liquid diet today We will consider low fat diet and change to PO ABX tomorrow if she does well. May be able to D/C home then.   LOS: 5 days    JENNINGS,WILLARD 03/31/2015

## 2015-03-31 NOTE — Progress Notes (Signed)
[facility]                                                                              Patient Demographics  Kathryn [name], is a 64 y.o. female, DOB - 08-Sep-1950, ZOX:[id number]  Admit date - 03/26/2015   Admitting Physician Alba Cory, MD  Outpatient Primary MD for the patient is Lupe Carney, MD  LOS - 5   Chief Complaint  Patient presents with  . Pancreatitis       Brief HPI   Patient is a 64 year old female with prior history of alcohol use, cirrhosis, chronic pancreatitis, PVD, hypertension.  She presented to the ER after a sudden onset of abdominal pain and vomiting on the morning of admission. Patient had an EUS (without biopsy) on 12/7 by Dr. Dulce Sellar. Her bile duct was not cannulated during the procedure. She felt well immediately after the procedure however on the morning of admission she woke up at 5 AM with vomiting and pain. In the emergency department her potassium was 2.8 and mag was 0.8. Total bili was 1/2 and AST 134. Amylase 95, Lipase 42. Xrays of her chest and abdomen were negative. EKG shows sinus rhythm with a prolonged QTc. Patient was admitted for acute on chronic pancreatitis.  Assessment & Plan    Principal Problem:  Acute exacerbation of chronic pancreatitis, acute cholecystitis - CT scan of the abdomen and pelvis reviewed, shows gallbladder wall edema with acute cholecystitis.  - HIDA scan shows hepatic dysfunction or partial CBD obstruction of both, gallbladder not definitely identified suggesting cystic duct obstruction. - MRCP showed acute on chronic pancreatitis but no pancreatic mass. Cholelithiasis without any biliary dilatation or choledocholithiasis.  -  Advance diet to low-fat diet, general surgery and GI recommendations reviewed, - If tolerating solid diet, changed to oral antibiotics and likely DC home in a.m.  Active problems  Lactic acidosis with dehydration  - Continue IV fluid hydration, lactic acid  improved.  Palpitations: Patient had a short run of V. tach (7 beats) likely due to electrolyte abnormalities on admission. - BMET stable, keep K ~4, MG >2  Alcoholic liver disease with possible pancreatic duct obstruction/lesion MRI performed in November 2016 showed possible pancreatic lesion. EUS performed prior to admission by Dr. Dulce Sellar Continue outpatient management.  Code Status: Full CODE STATUS   Family Communication: Discussed in detail with the patient, all imaging results, lab results explained to the patient    Disposition Plan: Likely DC home in a.m. if tolerating solid diet  Time Spent in minutes 15 minutes  Procedures  CT abd HIDA scan  Consults   GI Surgery  DVT Prophylaxis heparin subcutaneous   Medications  Scheduled Meds: . allopurinol  100 mg Oral BID  . heparin  5,000 Units Subcutaneous 3 times per day  . pantoprazole  40 mg Oral BID  . piperacillin-tazobactam (ZOSYN)  IV  3.375 g Intravenous Q8H  . sodium chloride  3 mL Intravenous Q12H   Continuous Infusions: . sodium chloride 75 mL/hr at 03/31/15 0902   PRN Meds:.acetaminophen **OR** acetaminophen, HYDROmorphone (DILAUDID) injection, polyethylene glycol   Antibiotics   Anti-infectives  Start     Dose/Rate Route Frequency Ordered Stop   03/27/15 0830  piperacillin-tazobactam (ZOSYN) IVPB 3.375 g     3.375 g 12.5 mL/hr over 240 Minutes Intravenous Every 8 hours 03/27/15 0826          Subjective:   Kathryn [name] was seen and examined today. States overnight was feeling nauseous and abdominal pain with full liquid diet, this morning feeling somewhat better.  Patient denies dizziness, chest pain, shortness of breath, new weakness, numbess, tingling. No acute events overnight.    Objective:   Blood pressure 107/76, pulse 87, temperature 98.4 F (36.9 C), temperature source Oral, resp. rate 16, height 4' 11.5" (1.511 m), weight 54.25 kg (119 lb 9.6 oz), SpO2 97 %.  Wt Readings from Last 3  Encounters:  03/26/15 54.25 kg (119 lb 9.6 oz)  12/26/14 49.442 kg (109 lb)  12/25/14 49.442 kg (109 lb)     Intake/Output Summary (Last 24 hours) at 03/31/15 1214 Last data filed at 03/31/15 0802  Gross per 24 hour  Intake 2920.84 ml  Output   2775 ml  Net 145.84 ml    Exam  General: Alert and oriented x 3, NAD  HEENT:  PERRLA, EOMI  Neck: Supple, no JVD, no masses  CVS: S1 S2 clear, RRR  Respiratory: CTAB  Abdomen: Soft, NT, ND, NBS  Ext: no cyanosis clubbing or edema  Neuro: no new deficits  Skin: No rashes  Psych: Normal affect and demeanor, alert and oriented x3    Data Review   Micro Results No results found for this or any previous visit (from the past 240 hour(s)).  Radiology Reports Nm Hepatobiliary Liver Func  03/27/2015  CLINICAL DATA:  Cholecystitis.  Elevated bilirubin. EXAM: NUCLEAR MEDICINE HEPATOBILIARY IMAGING TECHNIQUE: Sequential images of the abdomen were obtained out to 60 minutes following intravenous administration of radiopharmaceutical. RADIOPHARMACEUTICALS:  5.25 mCi Tc-8182m  Choletec IV COMPARISON:  CT scan 03/26/2015 FINDINGS: At 1 hour no activity was seen in the biliary tree or bowel. At 2 hours there is some faint activity seen in the bowel but no definite biliary activity. Probable faint activity in the duodenum. I do not see any convincing gallbladder activity at 2 hours. IMPRESSION: Minimal excretion of the radiopharmaceutical is identified from the liver. This could be due to hepatic dysfunction or partial common bile duct obstruction or both. Gallbladder is not definitely identified suggesting cystic duct obstruction. Electronically Signed   By: Rudie MeyerP.  Gallerani M.D.   On: 03/27/2015 17:08   Ct Abdomen Pelvis W Contrast  03/26/2015  CLINICAL DATA:  Left upper quadrant abdominal pain and tenderness with nausea and vomiting. History of alcoholic liver disease, pancreatitis and colitis. EXAM: CT ABDOMEN AND PELVIS WITH CONTRAST TECHNIQUE:  Multidetector CT imaging of the abdomen and pelvis was performed using the standard protocol following bolus administration of intravenous contrast. CONTRAST:  100mL OMNIPAQUE IOHEXOL 300 MG/ML  SOLN COMPARISON:  Abdominal pelvic CT 01/02/2012. Abdominal MRI 02/26/2015. FINDINGS: Lower chest: Trace pleural effusions with mild bibasilar atelectasis, improved from previous CT. Small hiatal hernia. Hepatobiliary: The liver has a stable appearance with diffuse steatosis, relative enlargement of the left lobe and scarring and fibrosis throughout the right lobe. No focal hepatic lesions are identified. There are several small calcified gallstones. There is new gallbladder distention with wall thickening and surrounding inflammation worrisome for acute cholecystitis. There is mild prominence of the extrahepatic biliary system which measures up to 8 mm in diameter. No intraductal calculi seen. Pancreas: There are  multiple parenchymal calcifications throughout the pancreas with associated atrophy consistent with chronic calcific pancreatitis. There is no evidence of pancreatic mass, significant ductal dilatation or surrounding inflammation. Spleen: Normal in size without focal abnormality. Adrenals/Urinary Tract: Both adrenal glands appear normal. The kidneys appear stable without focal abnormality, hydronephrosis or nephrolithiasis. The bladder is nearly empty and suboptimally evaluated. Stomach/Bowel: No evidence of bowel wall thickening, distention or surrounding inflammatory change. Vascular/Lymphatic: There are no enlarged abdominal or pelvic lymph nodes. Mild aortoiliac atherosclerosis. Reproductive: Stable. There is a left uterine fundal fibroid. There are mild gonadal vein varices on the left. Other: No ascites or peritoneal nodularity. Small umbilical hernia containing only fat. Musculoskeletal: Right total hip arthroplasty and posttraumatic deformities within the left pubic bones are noted. There is chronic left  femoral head avascular necrosis. There is a moderate convex left lumbar scoliosis with associated spondylosis. There is chronic atrophy of the right iliopsoas musculature. IMPRESSION: 1. New gallbladder distention, wall thickening and surrounding inflammatory changes worrisome for acute cholecystitis. 2. Calcific pancreatitis without evidence of acute inflammation or significant ductal dilatation. 3. Stable cirrhotic changes within the liver with fibrosis in the right lobe. 4. Stable osseous findings, including the spondylosis and chronic left femoral head avascular necrosis. Electronically Signed   By: Carey Bullocks M.D.   On: 03/26/2015 21:30   Mr 3d Recon At Scanner  03/29/2015  CLINICAL DATA:  Cirrhosis, chronic pancreatitis. Abdominal pain and vomiting. Acute on chronic pancreatitis. Elevated liver enzymes. EXAM: MRI ABDOMEN WITHOUT CONTRAST  (INCLUDING MRCP) TECHNIQUE: Multiplanar multisequence MR imaging of the abdomen was performed. Heavily T2-weighted images of the biliary and pancreatic ducts were obtained, and three-dimensional MRCP images were rendered by post processing. COMPARISON:  Multiple exams, including 03/26/2015 CT scan and prior abdominal MRI from 02/26/2015 FINDINGS: Despite efforts by the technologist and patient, motion artifact is present on today's exam and could not be eliminated. This reduces exam sensitivity and specificity. Lower chest: Bilateral pleural effusions with associated atelectasis. Cannot exclude pneumonia in the left lower lobe. Hepatobiliary: Prominently enlarged left hepatic lobe and shrunken right hepatic lobe. Layering gallstones in the gallbladder, images 20 through 26 of series 5, generally in the 2-4 mm range. No biliary dilatation or specific signs of choledocholithiasis, the common bile duct is only 3 mm in diameter. Arterial phase accentuated enhancement in the right hepatic lobe and to a lesser extent laterally in the left hepatic lobe lateral segment is  likely from fibrosis, and persists on delayed images especially in the right hepatic lobe. Signal dropout in the liver indicate steatosis. Against this background, I do not observe a focal lesion of special concern for hepatocellular carcinoma. Pancreas: The pancreas is atrophic. Concern was raised on the prior MRI for the possibility of pancreatic malignancy but the appearance on today' s exam could certainly be explained by pancreatitis, and the dorsal pancreatic duct in the tail of the pancreas no longer appears dilated as it did before. There is peripancreatic stranding also tracking around the splenic artery, favoring acute component to the patient's pancreatitis. There is a mildly enlarged porta hepatis lymph nodes and peripancreatic lymph nodes which may be reactive. Spleen: Unremarkable -no splenomegaly. Adrenals/Urinary Tract: Unremarkable Stomach/Bowel: Somewhat lax ligament of Treitz ; the duodenum crosses midline but does not extend up to the level of the do duodenal bulb. Vascular/Lymphatic: Stable mildly enlarged porta hepatis and peripancreatic lymph nodes as shown on prior exams, likely reactive. Other: No supplemental non-categorized findings. Musculoskeletal: Levoconvex lumbar scoliosis with rotary component. The  distal cord appears split on image 15 series 6, compatible with diastematomyelia. This has been previously shown for example on 03/11/2009 lumbar spine MRI. IMPRESSION: 1. Acute on chronic pancreatitis. Currently there is no specific findings of pancreatic mass, and the dorsal pancreatic duct is not dilated in the pancreatic tail as it was on the prior MRI. 2. Hepatic cirrhosis with enlargement the left hepatic lobe, fibrosis of the right hepatic lobe, and hepatic steatosis. Reactive lymph nodes in the porta hepatis and peripancreatic region. 3. Cholelithiasis, without biliary dilatation or evidence of choledocholithiasis. 4. Lax ligament of Treitz. 5. Bilateral pleural effusions with  atelectasis. I can't specifically exclude pneumonia in the left lower lobe. 6. Lumbar scoliosis. 7. Chronic/congenital lower spinal cord diastematomyelia. Electronically Signed   By: Gaylyn Rong M.D.   On: 03/29/2015 16:26   Dg Chest Port 1 View  03/26/2015  CLINICAL DATA:  Myocardial infarction.  Ventricular tachycardia EXAM: PORTABLE CHEST 1 VIEW COMPARISON:  09/29/2014 FINDINGS: Normal heart size and stable aortic contours. There is mild atelectatic type opacity at the medial right base. No edema, effusion, or consolidation. Remote left sixth rib fracture. No acute osseous finding. IMPRESSION: Mild right basilar atelectasis. Electronically Signed   By: Marnee Spring M.D.   On: 03/26/2015 11:13   Dg Abd 2 Views  03/26/2015  CLINICAL DATA:  Nausea and vomiting EXAM: ABDOMEN - 2 VIEW COMPARISON:  None. FINDINGS: Scattered large and small bowel gas is noted. Degenerative changes of the lumbar spine are seen with a scoliosis concave to the right. Postsurgical changes in the right hip are noted. Prior fractures in the left pubic rami are seen. No free air is noted. No acute abnormality noted. IMPRESSION: No acute abnormality seen. Electronically Signed   By: Alcide Clever M.D.   On: 03/26/2015 14:04   Mr Jorja Loa Cm/mrcp  03/29/2015  CLINICAL DATA:  Cirrhosis, chronic pancreatitis. Abdominal pain and vomiting. Acute on chronic pancreatitis. Elevated liver enzymes. EXAM: MRI ABDOMEN WITHOUT CONTRAST  (INCLUDING MRCP) TECHNIQUE: Multiplanar multisequence MR imaging of the abdomen was performed. Heavily T2-weighted images of the biliary and pancreatic ducts were obtained, and three-dimensional MRCP images were rendered by post processing. COMPARISON:  Multiple exams, including 03/26/2015 CT scan and prior abdominal MRI from 02/26/2015 FINDINGS: Despite efforts by the technologist and patient, motion artifact is present on today's exam and could not be eliminated. This reduces exam sensitivity and  specificity. Lower chest: Bilateral pleural effusions with associated atelectasis. Cannot exclude pneumonia in the left lower lobe. Hepatobiliary: Prominently enlarged left hepatic lobe and shrunken right hepatic lobe. Layering gallstones in the gallbladder, images 20 through 26 of series 5, generally in the 2-4 mm range. No biliary dilatation or specific signs of choledocholithiasis, the common bile duct is only 3 mm in diameter. Arterial phase accentuated enhancement in the right hepatic lobe and to a lesser extent laterally in the left hepatic lobe lateral segment is likely from fibrosis, and persists on delayed images especially in the right hepatic lobe. Signal dropout in the liver indicate steatosis. Against this background, I do not observe a focal lesion of special concern for hepatocellular carcinoma. Pancreas: The pancreas is atrophic. Concern was raised on the prior MRI for the possibility of pancreatic malignancy but the appearance on today' s exam could certainly be explained by pancreatitis, and the dorsal pancreatic duct in the tail of the pancreas no longer appears dilated as it did before. There is peripancreatic stranding also tracking around the splenic artery, favoring acute  component to the patient's pancreatitis. There is a mildly enlarged porta hepatis lymph nodes and peripancreatic lymph nodes which may be reactive. Spleen: Unremarkable -no splenomegaly. Adrenals/Urinary Tract: Unremarkable Stomach/Bowel: Somewhat lax ligament of Treitz ; the duodenum crosses midline but does not extend up to the level of the do duodenal bulb. Vascular/Lymphatic: Stable mildly enlarged porta hepatis and peripancreatic lymph nodes as shown on prior exams, likely reactive. Other: No supplemental non-categorized findings. Musculoskeletal: Levoconvex lumbar scoliosis with rotary component. The distal cord appears split on image 15 series 6, compatible with diastematomyelia. This has been previously shown for  example on 03/11/2009 lumbar spine MRI. IMPRESSION: 1. Acute on chronic pancreatitis. Currently there is no specific findings of pancreatic mass, and the dorsal pancreatic duct is not dilated in the pancreatic tail as it was on the prior MRI. 2. Hepatic cirrhosis with enlargement the left hepatic lobe, fibrosis of the right hepatic lobe, and hepatic steatosis. Reactive lymph nodes in the porta hepatis and peripancreatic region. 3. Cholelithiasis, without biliary dilatation or evidence of choledocholithiasis. 4. Lax ligament of Treitz. 5. Bilateral pleural effusions with atelectasis. I can't specifically exclude pneumonia in the left lower lobe. 6. Lumbar scoliosis. 7. Chronic/congenital lower spinal cord diastematomyelia. Electronically Signed   By: Gaylyn Rong M.D.   On: 03/29/2015 16:26    CBC  Recent Labs Lab 03/27/15 0512 03/28/15 0503 03/29/15 0418 03/30/15 0548 03/31/15 0705  WBC 6.0 5.6 4.6 4.5 4.5  HGB 10.9* 11.1* 10.1* 9.8* 9.8*  HCT 33.2* 34.4* 31.5* 31.6* 31.2*  PLT 166 144* 150 158 159  MCV 97.9 98.3 98.4 99.4 99.0  MCH 32.2 31.7 31.6 30.8 31.1  MCHC 32.8 32.3 32.1 31.0 31.4  RDW 14.0 14.1 13.8 14.0 14.4    Chemistries   Recent Labs Lab 03/26/15 1109 03/26/15 1513 03/27/15 0512 03/28/15 0503 03/29/15 0418 03/30/15 0548 03/31/15 0705  NA 143 142 135 136 136 135 134*  K 2.8* 3.5 4.1 4.3 3.4* 3.7 3.5  CL 105 103 103 105 103 107 104  CO2 20* 22  GLUCOSE 135* 116* 112* 86 85 87 97  BUN 7 8 <5* <5* <5* <5* <5*  CREATININE 0.77 0.81 0.78 0.78 0.80 0.82 0.85  CALCIUM 9.0 8.9 7.6* 7.8* 7.6* 7.4* 7.7*  MG 0.8* 1.9  --   --   --   --   --   AST 134*  --  128* 65* 40 32 34  ALT 30  --  46 37 ALKPHOS 178*  --  170* 159* 152* 152* 180*  BILITOT 1.3*  --  4.3* 5.7* 6.2* 5.0* 3.7*   ------------------------------------------------------------------------------------------------------------------ estimated creatinine clearance is 52.4 mL/min  (by C-G formula based on Cr of 0.85). ------------------------------------------------------------------------------------------------------------------ No results for input(s): HGBA1C in the last 72 hours. ------------------------------------------------------------------------------------------------------------------ No results for input(s): CHOL, HDL, LDLCALC, TRIG, CHOLHDL, LDLDIRECT in the last 72 hours. ------------------------------------------------------------------------------------------------------------------ No results for input(s): TSH, T4TOTAL, T3FREE, THYROIDAB in the last 72 hours.  Invalid input(s): FREET3 ------------------------------------------------------------------------------------------------------------------ No results for input(s): VITAMINB12, FOLATE, FERRITIN, TIBC, IRON, RETICCTPCT in the last 72 hours.  Coagulation profile No results for input(s): INR, PROTIME in the last 168 hours.  No results for input(s): DDIMER in the last 72 hours.  Cardiac Enzymes  Recent Labs Lab 03/26/15 1109  TROPONINI <0.03   ------------------------------------------------------------------------------------------------------------------ Invalid input(s): POCBNP  No results for input(s): GLUCAP in the last 72 hours.   Kathryn [name] M.D. [facility] 03/31/2015, 12:14 PM  Pager: [phone]  Between 7am to 7pm - call Pager - [phone]  After 7pm go to [email or web address] - password [email or web address]  Call night coverage person covering after 7pm

## 2015-03-31 NOTE — Progress Notes (Signed)
RN scanned dilaudid as given but RN noted that pain medication was given at 1312 by Jacquiline DoeJennifer Zhu RN. RN edit administration to not given and wasted dilaudid with Jacquiline DoeJennifer Zhu RN.

## 2015-03-31 NOTE — Plan of Care (Signed)
Problem: Nutritional: Goal: Ability to achieve adequate nutritional intake will improve Outcome: Not Progressing Pt did not eat much of breakfast or lunch today.

## 2015-04-01 DIAGNOSIS — K861 Other chronic pancreatitis: Secondary | ICD-10-CM

## 2015-04-01 DIAGNOSIS — K709 Alcoholic liver disease, unspecified: Secondary | ICD-10-CM

## 2015-04-01 DIAGNOSIS — I10 Essential (primary) hypertension: Secondary | ICD-10-CM

## 2015-04-01 DIAGNOSIS — K210 Gastro-esophageal reflux disease with esophagitis: Secondary | ICD-10-CM

## 2015-04-01 DIAGNOSIS — K859 Acute pancreatitis without necrosis or infection, unspecified: Principal | ICD-10-CM

## 2015-04-01 LAB — COMPREHENSIVE METABOLIC PANEL
ALK PHOS: 191 U/L — AB (ref 38–126)
ALT: 16 U/L (ref 14–54)
ANION GAP: 8 (ref 5–15)
AST: 29 U/L (ref 15–41)
Albumin: 2.1 g/dL — ABNORMAL LOW (ref 3.5–5.0)
BUN: <5 mg/dL — ABNORMAL LOW (ref 6–20)
CALCIUM: 8.3 mg/dL — AB (ref 8.9–10.3)
CO2: 24 mmol/L (ref 22–32)
Chloride: 104 mmol/L (ref 101–111)
Creatinine, Ser: 0.86 mg/dL (ref 0.44–1.00)
GFR calc non Af Amer: >60 mL/min (ref >60)
Glucose, Bld: 111 mg/dL — ABNORMAL HIGH (ref 65–99)
Potassium: 3.9 mmol/L (ref 3.5–5.1)
SODIUM: 136 mmol/L (ref 135–145)
TOTAL PROTEIN: 4.9 g/dL — AB (ref 6.5–8.1)
Total Bilirubin: 2.7 mg/dL — ABNORMAL HIGH (ref 0.3–1.2)

## 2015-04-01 LAB — CBC
HCT: 31.7 % — ABNORMAL LOW (ref 36.0–46.0)
HEMOGLOBIN: 9.9 g/dL — AB (ref 12.0–15.0)
MCH: 30.7 pg (ref 26.0–34.0)
MCHC: 31.2 g/dL (ref 30.0–36.0)
MCV: 98.4 fL (ref 78.0–100.0)
PLATELETS: 177 10*3/uL (ref 150–400)
RBC: 3.22 MIL/uL — AB (ref 3.87–5.11)
RDW: 14.8 % (ref 11.5–15.5)
WBC: 4.7 10*3/uL (ref 4.0–10.5)

## 2015-04-01 MED ORDER — PROPRANOLOL HCL 10 MG PO TABS
10.0000 mg | ORAL_TABLET | Freq: Two times a day (BID) | ORAL | Status: DC
  Administered 2015-04-01 – 2015-04-02 (×3): 10 mg via ORAL
  Filled 2015-04-01 (×4): qty 1

## 2015-04-01 MED ORDER — POLYETHYLENE GLYCOL 3350 17 G PO PACK
17.0000 g | PACK | Freq: Every day | ORAL | Status: DC
  Administered 2015-04-01: 17 g via ORAL
  Filled 2015-04-01: qty 1

## 2015-04-01 MED ORDER — HYDROMORPHONE HCL 2 MG PO TABS
2.0000 mg | ORAL_TABLET | ORAL | Status: DC | PRN
  Administered 2015-04-01 – 2015-04-02 (×6): 2 mg via ORAL
  Filled 2015-04-01 (×6): qty 1

## 2015-04-01 MED ORDER — SPIRONOLACTONE 25 MG PO TABS
50.0000 mg | ORAL_TABLET | Freq: Every day | ORAL | Status: DC
  Administered 2015-04-01 – 2015-04-02 (×2): 50 mg via ORAL
  Filled 2015-04-01 (×2): qty 2

## 2015-04-01 MED ORDER — OXYCODONE HCL 5 MG PO TABS: 5.0000 mg | ORAL_TABLET | Freq: Four times a day (QID) | ORAL | Status: DC | PRN

## 2015-04-01 NOTE — Progress Notes (Signed)
PATIENT DETAILS Name: Kathryn [name] Age: 64 y.o. Sex: female Date of Birth: 1951/04/09 Admit Date: 03/26/2015 Admitting Physician Alba Cory, MD ZOX:[id number] Clovis Riley, MD  Subjective: Improving-tolerating advancement in diet. Reports only mild LLQ pain.  Assessment/Plan: Principal Problem: Acute on chronic pancreatitis: Improving with supportive measures. Diet being advanced.CT scan of the abdomen showed possible cholecystitis, HIDA scan was equivocal-however MRCP did not show any choledocholithiasis. Managed with supportive measures. Gastroenterology and general surgery following. Continue Zosyn, with transition to Augmentin in am.   Active Problems: Liver cirrhosis:? from alcohol use. Resume propranolol and Aldactone today.  Chronic Pancreatitis: Recent endoscopic ultrasound showed chronic pancreatitis-GI plans to repeat MRI as an outpatient (see endoscopic ultrasound official report). Supportive care with close outpatient follow-up.  History of gout: Continue allopurinol  GERD: Continue PPI  Anemia: Appears to have anemia of chronic disease, stable for outpatient follow-up  Chronic pain: Claims to take Dilaudid orally at home-continue  Status post left transmetatarsal amputation  Disposition: Remain inpatient-home tomorrow morning  Antimicrobial agents  See below  Anti-infectives    Start     Dose/Rate Route Frequency Ordered Stop   03/27/15 0830  piperacillin-tazobactam (ZOSYN) IVPB 3.375 g     3.375 g 12.5 mL/hr over 240 Minutes Intravenous Every 8 hours 03/27/15 0826        DVT Prophylaxis: Prophylactic Heparin   Code Status: Full code   Family Communication None at bedside  Procedures: None  CONSULTS:  GI and general surgery  Time spent 30 minutes-Greater than 50% of this time was spent in counseling, explanation of diagnosis, planning of further management, and coordination of care.  MEDICATIONS: Scheduled Meds: .  allopurinol  100 mg Oral BID  . heparin  5,000 Units Subcutaneous 3 times per day  . pantoprazole  40 mg Oral BID  . piperacillin-tazobactam (ZOSYN)  IV  3.375 g Intravenous Q8H  . polyethylene glycol  17 g Oral Daily  . sodium chloride  3 mL Intravenous Q12H   Continuous Infusions:  PRN Meds:.acetaminophen **OR** acetaminophen, HYDROmorphone, oxyCODONE    PHYSICAL EXAM: Vital signs in last 24 hours: Filed Vitals:   03/31/15 0538 03/31/15 1322 03/31/15 2209 04/01/15 0510  BP: 107/76 105/81 112/86 121/71  Pulse: 87 83 89 84  Temp: 98.4 F (36.9 C) 99.4 F (37.4 C) 98.9 F (37.2 C) 98.2 F (36.8 C)  TempSrc: Oral Oral Oral Oral  Resp: Height:      Weight:      SpO2: 97% 98% 99% 99%    Weight change:  Filed Weights   03/26/15 1302  Weight: 54.25 kg (119 lb 9.6 oz)   Body mass index is 23.76 kg/(m^2).   Gen Exam: Awake and alert with clear speech.   Neck: Supple, No JVD.   Chest: B/L Clear.   CVS: S1 S2 Regular, no murmurs.  Abdomen: soft, BS +, mildly tender in LLQ, non distended.  Extremities: no edema, lower extremities warm to touch.Left transmetatarsal amputation Neurologic: Non Focal.   Skin: No Rash.   Wounds: N/A.    Intake/Output from previous day:  Intake/Output Summary (Last 24 hours) at 04/01/15 1321 Last data filed at 04/01/15 1026  Gross per 24 hour  Intake    975 ml  Output   1400 ml  Net   -425 ml     LAB RESULTS: CBC  Recent Labs Lab 03/28/15 0503 03/29/15 0418  03/30/15 0548 03/31/15 0705 04/01/15 0519  WBC 5.6 4.6 4.5 4.5 4.7  HGB 11.1* 10.1* 9.8* 9.8* 9.9*  HCT 34.4* 31.5* 31.6* 31.2* 31.7*  PLT 144* 150 158 159 177  MCV 98.3 98.4 99.4 99.0 98.4  MCH 31.7 31.6 30.8 31.1 30.7  MCHC 32.3 32.1 31.0 31.4 31.2  RDW 14.1 13.8 14.0 14.4 14.8    Chemistries   Recent Labs Lab 03/26/15 1109 03/26/15 1513  03/28/15 0503 03/29/15 0418 03/30/15 0548 03/31/15 0705 04/01/15 0519  NA 143 142  < > 136 136 135 134* 136    K 2.8* 3.5  < > 4.3 3.4* 3.7 3.5 3.9  CL 105 103  < > 105 103 107 104 104  CO2 27 29  < > 22 24 20* 22 24  GLUCOSE 135* 116*  < > 86 85 87 97 111*  BUN 7 8  < > <5* <5* <5* <5* <5*  CREATININE 0.77 0.81  < > 0.78 0.80 0.82 0.85 0.86  CALCIUM 9.0 8.9  < > 7.8* 7.6* 7.4* 7.7* 8.3*  MG 0.8* 1.9  --   --   --   --   --   --   < > = values in this interval not displayed.  CBG: No results for input(s): GLUCAP in the last 168 hours.  GFR Estimated Creatinine Clearance: 51.8 mL/min (by C-G formula based on Cr of 0.86).  Coagulation profile No results for input(s): INR, PROTIME in the last 168 hours.  Cardiac Enzymes  Recent Labs Lab 03/26/15 1109  TROPONINI <0.03    Invalid input(s): POCBNP No results for input(s): DDIMER in the last 72 hours. No results for input(s): HGBA1C in the last 72 hours. No results for input(s): CHOL, HDL, LDLCALC, TRIG, CHOLHDL, LDLDIRECT in the last 72 hours. No results for input(s): TSH, T4TOTAL, T3FREE, THYROIDAB in the last 72 hours.  Invalid input(s): FREET3 No results for input(s): VITAMINB12, FOLATE, FERRITIN, TIBC, IRON, RETICCTPCT in the last 72 hours. No results for input(s): LIPASE, AMYLASE in the last 72 hours.  Urine Studies No results for input(s): UHGB, CRYS in the last 72 hours.  Invalid input(s): UACOL, UAPR, USPG, UPH, UTP, UGL, UKET, UBIL, UNIT, UROB, ULEU, UEPI, UWBC, URBC, UBAC, CAST, UCOM, BILUA  MICROBIOLOGY: No results found for this or any previous visit (from the past 240 hour(s)).  RADIOLOGY STUDIES/RESULTS: Nm Hepatobiliary Liver Func  03/27/2015  CLINICAL DATA:  Cholecystitis.  Elevated bilirubin. EXAM: NUCLEAR MEDICINE HEPATOBILIARY IMAGING TECHNIQUE: Sequential images of the abdomen were obtained out to 60 minutes following intravenous administration of radiopharmaceutical. RADIOPHARMACEUTICALS:  5.25 mCi Tc-54m  Choletec IV COMPARISON:  CT scan 03/26/2015 FINDINGS: At 1 hour no activity was seen in the biliary tree or  bowel. At 2 hours there is some faint activity seen in the bowel but no definite biliary activity. Probable faint activity in the duodenum. I do not see any convincing gallbladder activity at 2 hours. IMPRESSION: Minimal excretion of the radiopharmaceutical is identified from the liver. This could be due to hepatic dysfunction or partial common bile duct obstruction or both. Gallbladder is not definitely identified suggesting cystic duct obstruction. Electronically Signed   By: Rudie MeyerP.  Gallerani M.D.   On: 03/27/2015 17:08   Ct Abdomen Pelvis W Contrast  03/26/2015  CLINICAL DATA:  Left upper quadrant abdominal pain and tenderness with nausea and vomiting. History of alcoholic liver disease, pancreatitis and colitis. EXAM: CT ABDOMEN AND PELVIS WITH CONTRAST TECHNIQUE: Multidetector CT imaging of the abdomen  and pelvis was performed using the standard protocol following bolus administration of intravenous contrast. CONTRAST:  OMNIPAQUE IOHEXOL 300 MG/ML  SOLN COMPARISON:  Abdominal pelvic CT 01/02/2012. Abdominal MRI 02/26/2015. FINDINGS: Lower chest: Trace pleural effusions with mild bibasilar atelectasis, improved from previous CT. Small hiatal hernia. Hepatobiliary: The liver has a stable appearance with diffuse steatosis, relative enlargement of the left lobe and scarring and fibrosis throughout the right lobe. No focal hepatic lesions are identified. There are several small calcified gallstones. There is new gallbladder distention with wall thickening and surrounding inflammation worrisome for acute cholecystitis. There is mild prominence of the extrahepatic biliary system which measures up to 8 mm in diameter. No intraductal calculi seen. Pancreas: There are multiple parenchymal calcifications throughout the pancreas with associated atrophy consistent with chronic calcific pancreatitis. There is no evidence of pancreatic mass, significant ductal dilatation or surrounding inflammation. Spleen: Normal in  size without focal abnormality. Adrenals/Urinary Tract: Both adrenal glands appear normal. The kidneys appear stable without focal abnormality, hydronephrosis or nephrolithiasis. The bladder is nearly empty and suboptimally evaluated. Stomach/Bowel: No evidence of bowel wall thickening, distention or surrounding inflammatory change. Vascular/Lymphatic: There are no enlarged abdominal or pelvic lymph nodes. Mild aortoiliac atherosclerosis. Reproductive: Stable. There is a left uterine fundal fibroid. There are mild gonadal vein varices on the left. Other: No ascites or peritoneal nodularity. Small umbilical hernia containing only fat. Musculoskeletal: Right total hip arthroplasty and posttraumatic deformities within the left pubic bones are noted. There is chronic left femoral head avascular necrosis. There is a moderate convex left lumbar scoliosis with associated spondylosis. There is chronic atrophy of the right iliopsoas musculature. IMPRESSION: 1. New gallbladder distention, wall thickening and surrounding inflammatory changes worrisome for acute cholecystitis. 2. Calcific pancreatitis without evidence of acute inflammation or significant ductal dilatation. 3. Stable cirrhotic changes within the liver with fibrosis in the right lobe. 4. Stable osseous findings, including the spondylosis and chronic left femoral head avascular necrosis. Electronically Signed   By: Carey Bullocks M.D.   On: 03/26/2015 21:30   Mr 3d Recon At Scanner  03/29/2015  CLINICAL DATA:  Cirrhosis, chronic pancreatitis. Abdominal pain and vomiting. Acute on chronic pancreatitis. Elevated liver enzymes. EXAM: MRI ABDOMEN WITHOUT CONTRAST  (INCLUDING MRCP) TECHNIQUE: Multiplanar multisequence MR imaging of the abdomen was performed. Heavily T2-weighted images of the biliary and pancreatic ducts were obtained, and three-dimensional MRCP images were rendered by post processing. COMPARISON:  Multiple exams, including 03/26/2015 CT scan and  prior abdominal MRI from 02/26/2015 FINDINGS: Despite efforts by the technologist and patient, motion artifact is present on today's exam and could not be eliminated. This reduces exam sensitivity and specificity. Lower chest: Bilateral pleural effusions with associated atelectasis. Cannot exclude pneumonia in the left lower lobe. Hepatobiliary: Prominently enlarged left hepatic lobe and shrunken right hepatic lobe. Layering gallstones in the gallbladder, images 20 through 26 of series 5, generally in the 2-4 mm range. No biliary dilatation or specific signs of choledocholithiasis, the common bile duct is only 3 mm in diameter. Arterial phase accentuated enhancement in the right hepatic lobe and to a lesser extent laterally in the left hepatic lobe lateral segment is likely from fibrosis, and persists on delayed images especially in the right hepatic lobe. Signal dropout in the liver indicate steatosis. Against this background, I do not observe a focal lesion of special concern for hepatocellular carcinoma. Pancreas: The pancreas is atrophic. Concern was raised on the prior MRI for the possibility of pancreatic malignancy but the appearance  on today' s exam could certainly be explained by pancreatitis, and the dorsal pancreatic duct in the tail of the pancreas no longer appears dilated as it did before. There is peripancreatic stranding also tracking around the splenic artery, favoring acute component to the patient's pancreatitis. There is a mildly enlarged porta hepatis lymph nodes and peripancreatic lymph nodes which may be reactive. Spleen: Unremarkable -no splenomegaly. Adrenals/Urinary Tract: Unremarkable Stomach/Bowel: Somewhat lax ligament of Treitz ; the duodenum crosses midline but does not extend up to the level of the do duodenal bulb. Vascular/Lymphatic: Stable mildly enlarged porta hepatis and peripancreatic lymph nodes as shown on prior exams, likely reactive. Other: No supplemental non-categorized  findings. Musculoskeletal: Levoconvex lumbar scoliosis with rotary component. The distal cord appears split on image 15 series 6, compatible with diastematomyelia. This has been previously shown for example on 03/11/2009 lumbar spine MRI. IMPRESSION: 1. Acute on chronic pancreatitis. Currently there is no specific findings of pancreatic mass, and the dorsal pancreatic duct is not dilated in the pancreatic tail as it was on the prior MRI. 2. Hepatic cirrhosis with enlargement the left hepatic lobe, fibrosis of the right hepatic lobe, and hepatic steatosis. Reactive lymph nodes in the porta hepatis and peripancreatic region. 3. Cholelithiasis, without biliary dilatation or evidence of choledocholithiasis. 4. Lax ligament of Treitz. 5. Bilateral pleural effusions with atelectasis. I can't specifically exclude pneumonia in the left lower lobe. 6. Lumbar scoliosis. 7. Chronic/congenital lower spinal cord diastematomyelia. Electronically Signed   By: Gaylyn Rong M.D.   On: 03/29/2015 16:26   Dg Chest Port 1 View  03/26/2015  CLINICAL DATA:  Myocardial infarction.  Ventricular tachycardia EXAM: PORTABLE CHEST 1 VIEW COMPARISON:  09/29/2014 FINDINGS: Normal heart size and stable aortic contours. There is mild atelectatic type opacity at the medial right base. No edema, effusion, or consolidation. Remote left sixth rib fracture. No acute osseous finding. IMPRESSION: Mild right basilar atelectasis. Electronically Signed   By: Marnee Spring M.D.   On: 03/26/2015 11:13   Dg Abd 2 Views  03/26/2015  CLINICAL DATA:  Nausea and vomiting EXAM: ABDOMEN - 2 VIEW COMPARISON:  None. FINDINGS: Scattered large and small bowel gas is noted. Degenerative changes of the lumbar spine are seen with a scoliosis concave to the right. Postsurgical changes in the right hip are noted. Prior fractures in the left pubic rami are seen. No free air is noted. No acute abnormality noted. IMPRESSION: No acute abnormality seen.  Electronically Signed   By: Alcide Clever M.D.   On: 03/26/2015 14:04   Mr Jorja Loa Cm/mrcp  03/29/2015  CLINICAL DATA:  Cirrhosis, chronic pancreatitis. Abdominal pain and vomiting. Acute on chronic pancreatitis. Elevated liver enzymes. EXAM: MRI ABDOMEN WITHOUT CONTRAST  (INCLUDING MRCP) TECHNIQUE: Multiplanar multisequence MR imaging of the abdomen was performed. Heavily T2-weighted images of the biliary and pancreatic ducts were obtained, and three-dimensional MRCP images were rendered by post processing. COMPARISON:  Multiple exams, including 03/26/2015 CT scan and prior abdominal MRI from 02/26/2015 FINDINGS: Despite efforts by the technologist and patient, motion artifact is present on today's exam and could not be eliminated. This reduces exam sensitivity and specificity. Lower chest: Bilateral pleural effusions with associated atelectasis. Cannot exclude pneumonia in the left lower lobe. Hepatobiliary: Prominently enlarged left hepatic lobe and shrunken right hepatic lobe. Layering gallstones in the gallbladder, images 20 through 26 of series 5, generally in the 2-4 mm range. No biliary dilatation or specific signs of choledocholithiasis, the common bile duct is only 3 mm  in diameter. Arterial phase accentuated enhancement in the right hepatic lobe and to a lesser extent laterally in the left hepatic lobe lateral segment is likely from fibrosis, and persists on delayed images especially in the right hepatic lobe. Signal dropout in the liver indicate steatosis. Against this background, I do not observe a focal lesion of special concern for hepatocellular carcinoma. Pancreas: The pancreas is atrophic. Concern was raised on the prior MRI for the possibility of pancreatic malignancy but the appearance on today' s exam could certainly be explained by pancreatitis, and the dorsal pancreatic duct in the tail of the pancreas no longer appears dilated as it did before. There is peripancreatic stranding also tracking  around the splenic artery, favoring acute component to the patient's pancreatitis. There is a mildly enlarged porta hepatis lymph nodes and peripancreatic lymph nodes which may be reactive. Spleen: Unremarkable -no splenomegaly. Adrenals/Urinary Tract: Unremarkable Stomach/Bowel: Somewhat lax ligament of Treitz ; the duodenum crosses midline but does not extend up to the level of the do duodenal bulb. Vascular/Lymphatic: Stable mildly enlarged porta hepatis and peripancreatic lymph nodes as shown on prior exams, likely reactive. Other: No supplemental non-categorized findings. Musculoskeletal: Levoconvex lumbar scoliosis with rotary component. The distal cord appears split on image 15 series 6, compatible with diastematomyelia. This has been previously shown for example on 03/11/2009 lumbar spine MRI. IMPRESSION: 1. Acute on chronic pancreatitis. Currently there is no specific findings of pancreatic mass, and the dorsal pancreatic duct is not dilated in the pancreatic tail as it was on the prior MRI. 2. Hepatic cirrhosis with enlargement the left hepatic lobe, fibrosis of the right hepatic lobe, and hepatic steatosis. Reactive lymph nodes in the porta hepatis and peripancreatic region. 3. Cholelithiasis, without biliary dilatation or evidence of choledocholithiasis. 4. Lax ligament of Treitz. 5. Bilateral pleural effusions with atelectasis. I can't specifically exclude pneumonia in the left lower lobe. 6. Lumbar scoliosis. 7. Chronic/congenital lower spinal cord diastematomyelia. Electronically Signed   By: Gaylyn Rong M.D.   On: 03/29/2015 16:26    Jeoffrey Massed, MD  [facility] Pager:[phone]  If 7PM-7AM, please contact night-coverage [email or web address] Password [email or web address] 04/01/2015, 1:21 PM   LOS: 6 days

## 2015-04-01 NOTE — Progress Notes (Signed)
  Subjective: She didn't really eat yesterday.  She had some milk in the AM, and Ensure last PM.  She doesn't have pain on the right, it seems to be mostly LLQ.  I can't tell if it is better or worse, she hasn't been out of the room much.  She has a heart healthy diet in the room now.    Objective: Vital signs in last 24 hours: Temp:  [98.2 F (36.8 C)-99.4 F (37.4 C)] 98.2 F (36.8 C) (12/14 0510) Pulse Rate:  [83-89] 84 (12/14 0510) Resp:  [18] 18 (12/13 2209) BP: (105-121)/(71-86) 121/71 mmHg (12/14 0510) SpO2:  [98 %-99 %] 99 % (12/14 0510) Last BM Date: 03/26/15 Nothing PO recorded Heart healthy diet ordered yesteday at 2 PM IV 825 Urine 2050 BM x 2 Afebrile, VSS Bilirubin continues to improve WBC still 4.7 MRI 03/29/15:  Acute on chronic pancreatitis. Currently there is no specific findings of pancreatic mass, and the dorsal pancreatic duct is not dilated in the pancreatic tail as it was on the prior MRI. 2. Hepatic cirrhosis with enlargement the left hepatic lobe, fibrosis of the right hepatic lobe, and hepatic steatosis. Reactive lymph nodes in the porta hepatis and peripancreatic region. 3. Cholelithiasis, without biliary dilatation or evidence of choledocholithiasis. 4. Lax ligament of Treitz. 5. Bilateral pleural effusions with atelectasis. I can't specifically exclude pneumonia in the left lower lobe. 6. Lumbar scoliosis. 7. Chronic/congenital lower spinal cord diastematomyelia. Intake/Output from previous day: 12/13 0701 - 12/14 0700 In: 875 [I.V.:825; IV Piggyback:50] Out: 2050 [Urine:2050] Intake/Output this shift:    General appearance: alert, cooperative and no distress GI: soft, she still has some discomfort LLQ, + BS, + BM.  Lab Results:   Recent Labs  03/31/15 0705 04/01/15 0519  WBC 4.5 4.7  HGB 9.8* 9.9*  HCT 31.2* 31.7*  PLT 159 177    BMET  Recent Labs  03/31/15 0705 04/01/15 0519  NA 134* 136  K 3.5 3.9  CL 104 104  CO2 22  24  GLUCOSE 97 111*  BUN <5* <5*  CREATININE 0.85 0.86  CALCIUM 7.7* 8.3*   PT/INR No results for input(s): LABPROT, INR in the last 72 hours.   Recent Labs Lab 03/28/15 0503 03/29/15 0418 03/30/15 0548 03/31/15 0705 04/01/15 0519  AST 65* 40 32 34 29  ALT 37 28 20 19 16   ALKPHOS 159* 152* 152* 180* 191*  BILITOT 5.7* 6.2* 5.0* 3.7* 2.7*  PROT 5.1* 4.8* 4.4* 4.6* 4.9*  ALBUMIN 2.3* 2.2* 2.0* 2.0* 2.1*     Lipase     Component Value Date/Time   LIPASE 16 03/29/2015 0418     Studies/Results: No results found.  Medications: . allopurinol  100 mg Oral BID  . heparin  5,000 Units Subcutaneous 3 times per day  . pantoprazole  40 mg Oral BID  . piperacillin-tazobactam (ZOSYN)  IV  3.375 g Intravenous Q8H  . sodium chloride  3 mL Intravenous Q12H    Assessment/Plan Cholecystitis Chronic and acute pancreatitis Alcoholic cirrhosis high operative risk Palpitations (V-tach) Hx of MI in setting of septic shock 12/2011 Antibiotics: Zosyn day 6 DVT: Heparin/SCD   Plan:  I would see how she does on soft low fat diet and if OK she could go home on oral antibiotics when she is ready.  She is going home with someone who will be able to help her at home.    LOS: 6 days    Kathryn [name] 04/01/2015

## 2015-04-01 NOTE — Plan of Care (Signed)
Problem: Nutritional: Goal: Ability to achieve adequate nutritional intake will improve Outcome: Not Progressing Pt did not eat lunch. Pt was sleeping when lunch was delivered.

## 2015-04-02 MED ORDER — HYDROMORPHONE HCL 2 MG PO TABS: 1.0000 mg | ORAL_TABLET | ORAL | Status: DC | PRN

## 2015-04-02 MED ORDER — AMOXICILLIN-POT CLAVULANATE 875-125 MG PO TABS: 1.0000 | ORAL_TABLET | Freq: Two times a day (BID) | ORAL | Status: DC

## 2015-04-02 NOTE — Discharge Instructions (Signed)
Follow with Primary MD  [name], MD  and and Dr Dulce Sellarutlaw in 1-2 weeks   Please get a complete blood count and chemistry panel checked by your Primary MD at your next visit  Get Medicines reviewed and adjusted. Please take all your medications with you for your next visit with your Primary MD  Please request your Primary MD to go over all hospital tests and procedure/radiological results at the follow up, please ask your Primary MD to get all Hospital records sent to his/her office.  If you experience worsening of your admission symptoms, develop shortness of breath, life threatening emergency, suicidal or homicidal thoughts you must seek medical attention immediately by calling 911 or calling your MD immediately  if symptoms less severe.  You must read complete instructions/literature along with all the possible adverse reactions/side effects for all the Medicines you take and that have been prescribed to you. Take any new Medicines after you have completely understood and accpet all the possible adverse reactions/side effects.   Do not drive when taking Pain medications or sleeping medications (Benzodaizepines)  Do not take more than prescribed Pain, Sleep and Anxiety Medications  Special Instructions: If you have smoked or chewed Tobacco  in the last 2 yrs please stop smoking, stop any regular Alcohol  and or any Recreational drug use.  Wear Seat belts while driving.  Please note  You were cared for by a hospitalist during your hospital stay. Once you are discharged, your primary care physician will handle any further medical issues. Please note that NO REFILLS for any discharge medications will be authorized once you are discharged, as it is imperative that you return to your primary care physician (or establish a relationship with a primary care physician if you do not have one) for your aftercare needs so that they can reassess your need for medications and monitor your lab  values.

## 2015-04-02 NOTE — Discharge Summary (Addendum)
PATIENT DETAILS Name: Kathryn [name] Age: 64 y.o. Sex: female Date of Birth: 03-09-1951 MRN: [id number]. Admitting Physician: Elmarie Shiley, MD WTU:[facility] Alroy Dust, MD  Admit Date: 03/26/2015 Discharge date: 04/02/2015  Recommendations for Outpatient Follow-up:  1. Please ensure follow-up with gastroenterology.  2. Minimize use of narcotics as much as possible-may need referral to pain management at some point.  3. Continue to follow CBC-has developed anemia of chronic disease. Stable for outpatient workup and monitoring. (include homehealth, outpatient follow-up instructions, specific recommendations for PCP to follow-up on, etc.) 4. Please repeat CBC/BMET/LFT's at next visit  PRIMARY DISCHARGE DIAGNOSIS:  Principal Problem:   Acute on chronic pancreatitis [facility]) Active Problems:   Essential hypertension   GERD   Osteoporosis   Alcoholic liver disease (HCC)   Hypokalemia   Palpitation   Hypomagnesemia      PAST MEDICAL HISTORY: Past Medical History  Diagnosis Date  . Osteoporosis   . Alcoholic liver disease ([place])   . High cholesterol   . Ascites 09/22/11    "first time for , now resolved  . Pancreatitis several years ago  . History of blood transfusion 07/2011    "when I had elbow surgery"  . GERD (gastroesophageal reflux disease)   . Staph infection     left foot  . Fracture, humerus 07/2011    right  . Dysrhythmia     "irregular"  . Arthritis     "all over"  . Peripheral vascular disease ([place])   . Hypertension     not current  . Osteomyelitis ([place])     left foot  . Myocardial infarction ([place])     NSTEMI 12/2011 in setting of septic shock    DISCHARGE MEDICATIONS: Current Discharge Medication List    START taking these medications   Details  amoxicillin-clavulanate (AUGMENTIN) 875-125 MG tablet Take 1 tablet by mouth 2 (two) times daily. Qty: 8 tablet, Refills: 0      CONTINUE these medications which have CHANGED   Details  HYDROmorphone (DILAUDID)  2 MG tablet Take 0.5-1 tablets (1-2 mg total) by mouth every 4 (four) hours as needed for severe pain. Qty: 20 tablet, Refills: 0      CONTINUE these medications which have NOT CHANGED   Details  alendronate (FOSAMAX) 70 MG tablet Take 70 mg by mouth once a week. Sundays Refills: 12    allopurinol (ZYLOPRIM) 100 MG tablet Take 100 mg by mouth 2 (two) times daily.    colchicine 0.6 MG tablet Take 0.6 mg by mouth 2 (two) times daily as needed (gout).    [employer] CALCIUM CITRATE +D3 MINI 200-250 MG-UNIT TABS Take 1 tablet by mouth 2 (two) times daily with a meal. Refills: 11    folic acid (FOLVITE) 1 MG tablet Take 1 tablet (1 mg total) by mouth daily. Qty: 30 tablet, Refills: 0    furosemide (LASIX) 40 MG tablet Take 1 tablet (40 mg total) by mouth daily. Qty: 60 tablet, Refills: 2    gabapentin (NEURONTIN) 100 MG capsule Take 200 mg by mouth 3 (three) times daily as needed. Nerve pain Refills: 0    mirtazapine (REMERON) 15 MG tablet Take 1 tablet (15 mg total) by mouth at bedtime. Qty: 30 tablet, Refills: 0    omeprazole (PRILOSEC) 40 MG capsule Take 40 mg by mouth daily.    propranolol (INDERAL) 10 MG tablet Take 1 tablet (10 mg total) by mouth 2 (two) times daily. Qty: 60 tablet, Refills: 0   Associated Diagnoses:  Unspecified essential hypertension    spironolactone (ALDACTONE) 50 MG tablet Take 50 mg by mouth daily.    thiamine (VITAMIN B-1) 100 MG tablet Take 1 tablet (100 mg total) by mouth daily. Qty: 30 tablet, Refills: 0   Associated Diagnoses: EtOH dependence (HCC)        ALLERGIES:   Allergies  Allergen Reactions  . Codeine Hives    Causes blisters  . Tramadol Other (See Comments)    Loss of consciousness     BRIEF HPI:  See H&P, Labs, Consult and Test reports for all details in brief, patient is a 64 year old female with known history of liver cirrhosis, chronic pancreatitis who resented to the ED for evaluation of worsening abdominal pain along with  persistent nausea and vomiting.   CONSULTATIONS:   GI and general surgery  PERTINENT RADIOLOGIC STUDIES: Nm Hepatobiliary Liver Func  03/27/2015  CLINICAL DATA:  Cholecystitis.  Elevated bilirubin. EXAM: NUCLEAR MEDICINE HEPATOBILIARY IMAGING TECHNIQUE: Sequential images of the abdomen were obtained out to 60 minutes following intravenous administration of radiopharmaceutical. RADIOPHARMACEUTICALS:  5.25 mCi Tc-56m Choletec IV COMPARISON:  CT scan 03/26/2015 FINDINGS: At 1 hour no activity was seen in the biliary tree or bowel. At 2 hours there is some faint activity seen in the bowel but no definite biliary activity. Probable faint activity in the duodenum. I do not see any convincing gallbladder activity at 2 hours. IMPRESSION: Minimal excretion of the radiopharmaceutical is identified from the liver. This could be due to hepatic dysfunction or partial common bile duct obstruction or both. Gallbladder is not definitely identified suggesting cystic duct obstruction. Electronically Signed   By: PMarijo SanesM.D.   On: 03/27/2015 17:08   Ct Abdomen Pelvis W Contrast  03/26/2015  CLINICAL DATA:  Left upper quadrant abdominal pain and tenderness with nausea and vomiting. History of alcoholic liver disease, pancreatitis and colitis. EXAM: CT ABDOMEN AND PELVIS WITH CONTRAST TECHNIQUE: Multidetector CT imaging of the abdomen and pelvis was performed using the standard protocol following bolus administration of intravenous contrast. CONTRAST:  1059mOMNIPAQUE IOHEXOL 300 MG/ML  SOLN COMPARISON:  Abdominal pelvic CT 01/02/2012. Abdominal MRI 02/26/2015. FINDINGS: Lower chest: Trace pleural effusions with mild bibasilar atelectasis, improved from previous CT. Small hiatal hernia. Hepatobiliary: The liver has a stable appearance with diffuse steatosis, relative enlargement of the left lobe and scarring and fibrosis throughout the right lobe. No focal hepatic lesions are identified. There are several small  calcified gallstones. There is new gallbladder distention with wall thickening and surrounding inflammation worrisome for acute cholecystitis. There is mild prominence of the extrahepatic biliary system which measures up to 8 mm in diameter. No intraductal calculi seen. Pancreas: There are multiple parenchymal calcifications throughout the pancreas with associated atrophy consistent with chronic calcific pancreatitis. There is no evidence of pancreatic mass, significant ductal dilatation or surrounding inflammation. Spleen: Normal in size without focal abnormality. Adrenals/Urinary Tract: Both adrenal glands appear normal. The kidneys appear stable without focal abnormality, hydronephrosis or nephrolithiasis. The bladder is nearly empty and suboptimally evaluated. Stomach/Bowel: No evidence of bowel wall thickening, distention or surrounding inflammatory change. Vascular/Lymphatic: There are no enlarged abdominal or pelvic lymph nodes. Mild aortoiliac atherosclerosis. Reproductive: Stable. There is a left uterine fundal fibroid. There are mild gonadal vein varices on the left. Other: No ascites or peritoneal nodularity. Small umbilical hernia containing only fat. Musculoskeletal: Right total hip arthroplasty and posttraumatic deformities within the left pubic bones are noted. There is chronic left femoral head avascular necrosis. There  is a moderate convex left lumbar scoliosis with associated spondylosis. There is chronic atrophy of the right iliopsoas musculature. IMPRESSION: 1. New gallbladder distention, wall thickening and surrounding inflammatory changes worrisome for acute cholecystitis. 2. Calcific pancreatitis without evidence of acute inflammation or significant ductal dilatation. 3. Stable cirrhotic changes within the liver with fibrosis in the right lobe. 4. Stable osseous findings, including the spondylosis and chronic left femoral head avascular necrosis. Electronically Signed   By: Richardean Sale  M.D.   On: 03/26/2015 21:30   Mr 3d Recon At Scanner  03/29/2015  CLINICAL DATA:  Cirrhosis, chronic pancreatitis. Abdominal pain and vomiting. Acute on chronic pancreatitis. Elevated liver enzymes. EXAM: MRI ABDOMEN WITHOUT CONTRAST  (INCLUDING MRCP) TECHNIQUE: Multiplanar multisequence MR imaging of the abdomen was performed. Heavily T2-weighted images of the biliary and pancreatic ducts were obtained, and three-dimensional MRCP images were rendered by post processing. COMPARISON:  Multiple exams, including 03/26/2015 CT scan and prior abdominal MRI from 02/26/2015 FINDINGS: Despite efforts by the technologist and patient, motion artifact is present on today's exam and could not be eliminated. This reduces exam sensitivity and specificity. Lower chest: Bilateral pleural effusions with associated atelectasis. Cannot exclude pneumonia in the left lower lobe. Hepatobiliary: Prominently enlarged left hepatic lobe and shrunken right hepatic lobe. Layering gallstones in the gallbladder, images 20 through 26 of series 5, generally in the 2-4 mm range. No biliary dilatation or specific signs of choledocholithiasis, the common bile duct is only 3 mm in diameter. Arterial phase accentuated enhancement in the right hepatic lobe and to a lesser extent laterally in the left hepatic lobe lateral segment is likely from fibrosis, and persists on delayed images especially in the right hepatic lobe. Signal dropout in the liver indicate steatosis. Against this background, I do not observe a focal lesion of special concern for hepatocellular carcinoma. Pancreas: The pancreas is atrophic. Concern was raised on the prior MRI for the possibility of pancreatic malignancy but the appearance on today' s exam could certainly be explained by pancreatitis, and the dorsal pancreatic duct in the tail of the pancreas no longer appears dilated as it did before. There is peripancreatic stranding also tracking around the splenic artery,  favoring acute component to the patient's pancreatitis. There is a mildly enlarged porta hepatis lymph nodes and peripancreatic lymph nodes which may be reactive. Spleen: Unremarkable -no splenomegaly. Adrenals/Urinary Tract: Unremarkable Stomach/Bowel: Somewhat lax ligament of Treitz ; the duodenum crosses midline but does not extend up to the level of the do duodenal bulb. Vascular/Lymphatic: Stable mildly enlarged porta hepatis and peripancreatic lymph nodes as shown on prior exams, likely reactive. Other: No supplemental non-categorized findings. Musculoskeletal: Levoconvex lumbar scoliosis with rotary component. The distal cord appears split on image 15 series 6, compatible with diastematomyelia. This has been previously shown for example on 03/11/2009 lumbar spine MRI. IMPRESSION: 1. Acute on chronic pancreatitis. Currently there is no specific findings of pancreatic mass, and the dorsal pancreatic duct is not dilated in the pancreatic tail as it was on the prior MRI. 2. Hepatic cirrhosis with enlargement the left hepatic lobe, fibrosis of the right hepatic lobe, and hepatic steatosis. Reactive lymph nodes in the porta hepatis and peripancreatic region. 3. Cholelithiasis, without biliary dilatation or evidence of choledocholithiasis. 4. Lax ligament of Treitz. 5. Bilateral pleural effusions with atelectasis. I can't specifically exclude pneumonia in the left lower lobe. 6. Lumbar scoliosis. 7. Chronic/congenital lower spinal cord diastematomyelia. Electronically Signed   By: Van Clines M.D.   On: 03/29/2015  16:26   Dg Chest Port 1 View  03/26/2015  CLINICAL DATA:  Myocardial infarction.  Ventricular tachycardia EXAM: PORTABLE CHEST 1 VIEW COMPARISON:  09/29/2014 FINDINGS: Normal heart size and stable aortic contours. There is mild atelectatic type opacity at the medial right base. No edema, effusion, or consolidation. Remote left sixth rib fracture. No acute osseous finding. IMPRESSION: Mild right  basilar atelectasis. Electronically Signed   By: Monte Fantasia M.D.   On: 03/26/2015 11:13   Dg Abd 2 Views  03/26/2015  CLINICAL DATA:  Nausea and vomiting EXAM: ABDOMEN - 2 VIEW COMPARISON:  None. FINDINGS: Scattered large and small bowel gas is noted. Degenerative changes of the lumbar spine are seen with a scoliosis concave to the right. Postsurgical changes in the right hip are noted. Prior fractures in the left pubic rami are seen. No free air is noted. No acute abnormality noted. IMPRESSION: No acute abnormality seen. Electronically Signed   By: Inez Catalina M.D.   On: 03/26/2015 14:04   Mr Lambert Mody Cm/mrcp  03/29/2015  CLINICAL DATA:  Cirrhosis, chronic pancreatitis. Abdominal pain and vomiting. Acute on chronic pancreatitis. Elevated liver enzymes. EXAM: MRI ABDOMEN WITHOUT CONTRAST  (INCLUDING MRCP) TECHNIQUE: Multiplanar multisequence MR imaging of the abdomen was performed. Heavily T2-weighted images of the biliary and pancreatic ducts were obtained, and three-dimensional MRCP images were rendered by post processing. COMPARISON:  Multiple exams, including 03/26/2015 CT scan and prior abdominal MRI from 02/26/2015 FINDINGS: Despite efforts by the technologist and patient, motion artifact is present on today's exam and could not be eliminated. This reduces exam sensitivity and specificity. Lower chest: Bilateral pleural effusions with associated atelectasis. Cannot exclude pneumonia in the left lower lobe. Hepatobiliary: Prominently enlarged left hepatic lobe and shrunken right hepatic lobe. Layering gallstones in the gallbladder, images 20 through 26 of series 5, generally in the 2-4 mm range. No biliary dilatation or specific signs of choledocholithiasis, the common bile duct is only 3 mm in diameter. Arterial phase accentuated enhancement in the right hepatic lobe and to a lesser extent laterally in the left hepatic lobe lateral segment is likely from fibrosis, and persists on delayed images  especially in the right hepatic lobe. Signal dropout in the liver indicate steatosis. Against this background, I do not observe a focal lesion of special concern for hepatocellular carcinoma. Pancreas: The pancreas is atrophic. Concern was raised on the prior MRI for the possibility of pancreatic malignancy but the appearance on today' s exam could certainly be explained by pancreatitis, and the dorsal pancreatic duct in the tail of the pancreas no longer appears dilated as it did before. There is peripancreatic stranding also tracking around the splenic artery, favoring acute component to the patient's pancreatitis. There is a mildly enlarged porta hepatis lymph nodes and peripancreatic lymph nodes which may be reactive. Spleen: Unremarkable -no splenomegaly. Adrenals/Urinary Tract: Unremarkable Stomach/Bowel: Somewhat lax ligament of Treitz ; the duodenum crosses midline but does not extend up to the level of the do duodenal bulb. Vascular/Lymphatic: Stable mildly enlarged porta hepatis and peripancreatic lymph nodes as shown on prior exams, likely reactive. Other: No supplemental non-categorized findings. Musculoskeletal: Levoconvex lumbar scoliosis with rotary component. The distal cord appears split on image 15 series 6, compatible with diastematomyelia. This has been previously shown for example on 03/11/2009 lumbar spine MRI. IMPRESSION: 1. Acute on chronic pancreatitis. Currently there is no specific findings of pancreatic mass, and the dorsal pancreatic duct is not dilated in the pancreatic tail as it was on  the prior MRI. 2. Hepatic cirrhosis with enlargement the left hepatic lobe, fibrosis of the right hepatic lobe, and hepatic steatosis. Reactive lymph nodes in the porta hepatis and peripancreatic region. 3. Cholelithiasis, without biliary dilatation or evidence of choledocholithiasis. 4. Lax ligament of Treitz. 5. Bilateral pleural effusions with atelectasis. I can't specifically exclude pneumonia in  the left lower lobe. 6. Lumbar scoliosis. 7. Chronic/congenital lower spinal cord diastematomyelia. Electronically Signed   By: Van Clines M.D.   On: 03/29/2015 16:26     PERTINENT LAB RESULTS: CBC:  Recent Labs  03/31/15 0705 04/01/15 0519  WBC 4.5 4.7  HGB 9.8* 9.9*  HCT 31.2* 31.7*  PLT 159 177   CMET CMP     Component Value Date/Time   NA 136 04/01/2015 0519   NA 136 02/06/2013 1336   K 3.9 04/01/2015 0519   K 4.0 02/06/2013 1336   CL 104 04/01/2015 0519   CO2 24 04/01/2015 0519   CO2 25 02/06/2013 1336   GLUCOSE 111* 04/01/2015 0519   GLUCOSE 162* 02/06/2013 1336   GLUCOSE 97 11/30/2009   BUN <5* 04/01/2015 0519   BUN 12.2 02/06/2013 1336   CREATININE 0.86 04/01/2015 0519   CREATININE 1.1 02/06/2013 1336   CALCIUM 8.3* 04/01/2015 0519   CALCIUM 9.2 02/06/2013 1336   CALCIUM 8.4 07/26/2011 1329   PROT 4.9* 04/01/2015 0519   PROT 8.1 02/06/2013 1336   ALBUMIN 2.1* 04/01/2015 0519   ALBUMIN 3.0* 02/06/2013 1336   AST 29 04/01/2015 0519   AST 41* 02/06/2013 1336   ALT 16 04/01/2015 0519   ALT 20 02/06/2013 1336   ALKPHOS 191* 04/01/2015 0519   ALKPHOS 187* 02/06/2013 1336   BILITOT 2.7* 04/01/2015 0519   BILITOT 0.75 02/06/2013 1336   GFRNONAA >60 04/01/2015 0519   GFRAA >60 04/01/2015 0519    GFR Estimated Creatinine Clearance: 51.8 mL/min (by C-G formula based on Cr of 0.86). No results for input(s): LIPASE, AMYLASE in the last 72 hours. No results for input(s): CKTOTAL, CKMB, CKMBINDEX, TROPONINI in the last 72 hours. Invalid input(s): POCBNP No results for input(s): DDIMER in the last 72 hours. No results for input(s): HGBA1C in the last 72 hours. No results for input(s): CHOL, HDL, LDLCALC, TRIG, CHOLHDL, LDLDIRECT in the last 72 hours. No results for input(s): TSH, T4TOTAL, T3FREE, THYROIDAB in the last 72 hours.  Invalid input(s): FREET3 No results for input(s): VITAMINB12, FOLATE, FERRITIN, TIBC, IRON, RETICCTPCT in the last 72  hours. Coags: No results for input(s): INR in the last 72 hours.  Invalid input(s): PT Microbiology: No results found for this or any previous visit (from the past 240 hour(s)).   BRIEF HOSPITAL COURSE:  Acute on chronic pancreatitis: Admitted and managed with supportive care. Initially kept nothing by mouth, diet slowly advanced. By the of discharge was tolerating a soft diet. Abdomen is soft, with only very minimal tenderness in the left lower quadrant area. No further recommendations from general surgery, gastroenterology signed off. Stable for discharge as patient has met maximal benefit from her hospital stay and is tolerating diet by the day of discharge. I have instructed patient to stay on low-fat diet.   ? Acute cholecystitis: CT abdomen showed possible acute cholecystitis,HIDA scan was equivocal-however MRCP did not show any choledocholithiasis. Managed with intravenous Zosyn while inpatient. Per general surgery, given history of liver cirrhosis high operative risk, and hence managed medically. General surgery recommendations another 4 more days of antibiotics-we will transition to Augmentin on discharge complete a  ten-day course. As noted above, patient tolerating regular diet, and significantly improved by the day of discharge.   Active Problems: Liver cirrhosis:? from alcohol use. Resumed propranolol and Aldactone on 12/14, resuming Lasix on the day of discharge.looks euvolemic on exam.   Chronic Pancreatitis: Recent endoscopic ultrasound showed chronic pancreatitis-GI plans to repeat MRI as an outpatient (see endoscopic ultrasound official report). Supportive care with close outpatient follow-up. Per patient, she has been on Dilaudid at home for the past few months (on Dilaudid for pain while hospitalized), and is requesting Dilaudid on discharge (allergic to other narcotics). Given her acute issues, I will provide her a few days of Dilaudid-she has been asked to go to her primary  care M.D. for further needs/refills.   History of gout: Continue allopurinol  GERD: Continue PPI  Anemia: Appears to have anemia of chronic disease, stable for outpatient follow-up  Chronic pain: Claims to take Dilaudid orally at home-continue on discharge (see above). Note-she claims that she can regulate her bowels while on chronic narcotics-was given one dose of Miralax here in the hospital-following which patient claimed she had "loose stools".Patient had 2 BM's today-per RN no diarrhea noted. Doubt any diarrhea-have not prescribed her any stool softeners or laxatives on discharge.  TODAY-DAY OF DISCHARGE:  Subjective:   Kathryn [name] today has no headache,no chest pain,no new weakness tingling or numbness. Minimal left lower quad pain-but tolerating diet and moving bowels  Objective:   Blood pressure 119/89, pulse 81, temperature 98.2 F (36.8 C), temperature source Oral, resp. rate 18, height 4' 11.5" (1.511 m), weight 54.25 kg (119 lb 9.6 oz), SpO2 98 %.  Intake/Output Summary (Last 24 hours) at 04/02/15 1101 Last data filed at 04/02/15 1034  Gross per 24 hour  Intake    250 ml  Output    600 ml  Net   -350 ml   Filed Weights   03/26/15 1302  Weight: 54.25 kg (119 lb 9.6 oz)    Exam Awake Alert, Oriented *3, No new F.N deficits, Normal affect [place].AT,PERRAL Supple Neck,No JVD, No cervical lymphadenopathy appriciated.  Symmetrical Chest wall movement, Good air movement bilaterally, CTAB RRR,No Gallops,Rubs or new Murmurs, No Parasternal Heave +ve B.Sounds, Abd Soft, No organomegaly appriciated, No rebound -guarding or rigidity. No Cyanosis, Clubbing or edema, No new Rash or bruise  DISCHARGE CONDITION: Stable  DISPOSITION: Home with home health services  DISCHARGE INSTRUCTIONS:    Activity:  As tolerated with Full fall precautions use walker/cane & assistance as needed  Get Medicines reviewed and adjusted: Please take all your medications with you for your next  visit with your Primary MD  Please get a complete blood count and chemistry panel checked by your Primary MD at your next visit  Please request your Primary MD to go over all hospital tests and procedure/radiological results at the follow up, please ask your Primary MD to get all Hospital records sent to his/her office.  If you experience worsening of your admission symptoms, develop shortness of breath, life threatening emergency, suicidal or homicidal thoughts you must seek medical attention immediately by calling 911 or calling your MD immediately  if symptoms less severe.  You must read complete instructions/literature along with all the possible adverse reactions/side effects for all the Medicines you take and that have been prescribed to you. Take any new Medicines after you have completely understood and accpet all the possible adverse reactions/side effects.   Do not drive when taking Pain medications.   Do not take  more than prescribed Pain, Sleep and Anxiety Medications  Special Instructions: If you have smoked or chewed Tobacco  in the last 2 yrs please stop smoking, stop any regular Alcohol  and or any Recreational drug use.  Wear Seat belts while driving.  Please note  You were cared for by a hospitalist during your hospital stay. Once you are discharged, your primary care physician will handle any further medical issues. Please note that NO REFILLS for any discharge medications will be authorized once you are discharged, as it is imperative that you return to your primary care physician (or establish a relationship with a primary care physician if you do not have one) for your aftercare needs so that they can reassess your need for medications and monitor your lab values.   Diet recommendation: Heart Healthy diet  Discharge Instructions    Call MD for:  persistant nausea and vomiting    Complete by:  As directed      Call MD for:  severe uncontrolled pain    Complete by:   As directed      Diet - low sodium heart healthy    Complete by:  As directed   Stay on low fat diet-soft consistency     Increase activity slowly    Complete by:  As directed            Follow-up Information    Follow up with Donnie Coffin, MD. Schedule an appointment as soon as possible for a visit in 1 week.   Specialty:  Family Medicine   Why:  Hospital follow up   Contact information:   [address] [place] [address] [phone]       Follow up with Landry Dyke, MD. Schedule an appointment as soon as possible for a visit in 2 weeks.   Specialty:  Gastroenterology   Why:  Hospital follow up   Contact information:   [address] [place] [address] [phone]      Total Time spent on discharge equals 45 minutes.  SignedOren Binet 04/02/2015 11:01 AM

## 2015-04-02 NOTE — Care Management Important Message (Signed)
Important Message  Patient Details  Name: Kathryn [name] MRN: [id number] Date of Birth: 09/09/1950   Medicare Important Message Given:  Yes    Kinneth Fujiwara P Leanah Kolander 04/02/2015, 3:01 PM

## 2015-04-02 NOTE — Progress Notes (Signed)
Patient was discharged home by MD order; discharged instructions  review and give to patient with care notes and prescriptions; IV DIC; skin intact; patient will be escorted to the car by a volunteer via wheelchair.  

## 2015-04-02 NOTE — Progress Notes (Signed)
PT Cancellation Note  Patient Details Name: Kathryn [name] MRN: [id number] DOB: Dec 21, 1950   Cancelled Treatment:    Reason Eval/Treat Not Completed: PT screened, no needs identified, will sign off. Spoke with ordering physician regarding PT having evaluated and signed off on patient on 12/9 and he stated PT did not need to see pt again.  PT did tell him about the recommendation for HHPT.  Will d/c order.    Aaima Gaddie LUBECK 04/02/2015, 10:17 AM

## 2015-04-02 NOTE — Care Management Note (Signed)
Case Management Note  Patient Details  Name: Kathryn [name] MRN: [id number] Date of Birth: Nov 06, 1950  Subjective/Objective:                  Date-03-30-15 Initial Assessment Spoke with patient over the phone.  Introduced self as Sports coachcase manager and explained role in discharge planning and how to be reached.  Verified patient anticipates to go home at time of discharge.  Patient has DME cane walker, rolator, wheelchair, tub bench, 3in1. Expressed potential need for no other DME.  Patient denied needing help with their medication.  [facility] Plan: CM will continue to follow for discharge planning and [facility] resources.   Lawerance Sabalebbie Aviya Jarvie RN BSN CM [phone]    Action/Plan:  HH arraigned [facility]. No further CM needs identified.  Expected Discharge Date:                  Expected Discharge Plan:  Home w Home Health Services  In-House Referral:     Discharge planning Services  CM Consult  Post Acute Care Choice:    Choice offered to:     DME Arranged:    [facility]  [facility]  [facility]:     Status of Service:  Completed, signed off  Medicare Important Message Given:  Yes Date Medicare IM Given:    Medicare IM give by:    Date Additional Medicare IM Given:    Additional Medicare Important Message give by:     If discussed at Long Length of Stay Meetings, dates discussed:    Additional Comments:  Lawerance SabalDebbie Alexzavier Girardin, RN 04/02/2015, 11:35 AM

## 2015-05-26 DIAGNOSIS — H40013 Open angle with borderline findings, low risk, bilateral: Secondary | ICD-10-CM | POA: Diagnosis not present

## 2015-05-26 DIAGNOSIS — H40053 Ocular hypertension, bilateral: Secondary | ICD-10-CM | POA: Diagnosis not present

## 2015-05-26 DIAGNOSIS — Z0100 Encounter for examination of eyes and vision without abnormal findings: Secondary | ICD-10-CM | POA: Diagnosis not present

## 2015-05-28 DIAGNOSIS — M25562 Pain in left knee: Secondary | ICD-10-CM | POA: Diagnosis not present

## 2015-05-28 DIAGNOSIS — M25552 Pain in left hip: Secondary | ICD-10-CM | POA: Diagnosis not present

## 2015-06-03 ENCOUNTER — Other Ambulatory Visit: Payer: Self-pay | Admitting: Orthopedic Surgery

## 2015-06-03 DIAGNOSIS — M25562 Pain in left knee: Secondary | ICD-10-CM

## 2015-06-05 DIAGNOSIS — H40013 Open angle with borderline findings, low risk, bilateral: Secondary | ICD-10-CM | POA: Diagnosis not present

## 2015-06-08 ENCOUNTER — Ambulatory Visit
Admission: RE | Admit: 2015-06-08 | Discharge: 2015-06-08 | Disposition: A | Discharge status: Home / Self Care | Payer: Commercial Managed Care - HMO | Source: Ambulatory Visit | Attending: Orthopedic Surgery | Admitting: Orthopedic Surgery

## 2015-06-08 DIAGNOSIS — M25562 Pain in left knee: Secondary | ICD-10-CM | POA: Diagnosis not present

## 2015-06-11 DIAGNOSIS — M25562 Pain in left knee: Secondary | ICD-10-CM | POA: Diagnosis not present

## 2015-07-09 DIAGNOSIS — M25562 Pain in left knee: Secondary | ICD-10-CM | POA: Diagnosis not present

## 2015-07-09 DIAGNOSIS — Z89432 Acquired absence of left foot: Secondary | ICD-10-CM | POA: Diagnosis not present

## 2015-09-11 ENCOUNTER — Other Ambulatory Visit: Payer: Self-pay | Admitting: Gastroenterology

## 2015-09-11 DIAGNOSIS — K869 Disease of pancreas, unspecified: Secondary | ICD-10-CM

## 2015-09-22 ENCOUNTER — Other Ambulatory Visit: Payer: Commercial Managed Care - HMO

## 2015-09-30 ENCOUNTER — Ambulatory Visit
Admission: RE | Admit: 2015-09-30 | Discharge: 2015-09-30 | Disposition: A | Discharge status: Home / Self Care | Payer: Commercial Managed Care - HMO | Source: Ambulatory Visit | Attending: Gastroenterology | Admitting: Gastroenterology

## 2015-09-30 DIAGNOSIS — K869 Disease of pancreas, unspecified: Secondary | ICD-10-CM

## 2015-09-30 DIAGNOSIS — K8020 Calculus of gallbladder without cholecystitis without obstruction: Secondary | ICD-10-CM | POA: Diagnosis not present

## 2015-09-30 MED ORDER — GADOBENATE DIMEGLUMINE 529 MG/ML IV SOLN
5.0000 mL | Freq: Once | INTRAVENOUS | Status: AC | PRN
  Administered 2015-09-30: 5 mL via INTRAVENOUS

## 2015-10-26 ENCOUNTER — Emergency Department (HOSPITAL_COMMUNITY)
Admission: EM | Admit: 2015-10-26 | Discharge: 2015-10-27 | Disposition: A | Discharge status: Home / Self Care | Payer: Commercial Managed Care - HMO | Attending: Emergency Medicine | Admitting: Emergency Medicine

## 2015-10-26 ENCOUNTER — Encounter (HOSPITAL_COMMUNITY): Payer: Self-pay | Admitting: *Deleted

## 2015-10-26 DIAGNOSIS — I10 Essential (primary) hypertension: Secondary | ICD-10-CM | POA: Insufficient documentation

## 2015-10-26 DIAGNOSIS — Z96641 Presence of right artificial hip joint: Secondary | ICD-10-CM | POA: Insufficient documentation

## 2015-10-26 DIAGNOSIS — Z79899 Other long term (current) drug therapy: Secondary | ICD-10-CM | POA: Diagnosis not present

## 2015-10-26 DIAGNOSIS — R112 Nausea with vomiting, unspecified: Secondary | ICD-10-CM | POA: Diagnosis present

## 2015-10-26 DIAGNOSIS — K859 Acute pancreatitis without necrosis or infection, unspecified: Secondary | ICD-10-CM | POA: Diagnosis not present

## 2015-10-26 DIAGNOSIS — I252 Old myocardial infarction: Secondary | ICD-10-CM | POA: Insufficient documentation

## 2015-10-26 DIAGNOSIS — N390 Urinary tract infection, site not specified: Secondary | ICD-10-CM

## 2015-10-26 DIAGNOSIS — K861 Other chronic pancreatitis: Secondary | ICD-10-CM | POA: Diagnosis not present

## 2015-10-26 LAB — URINALYSIS, ROUTINE W REFLEX MICROSCOPIC
GLUCOSE, UA: NEGATIVE mg/dL
Hgb urine dipstick: NEGATIVE
Ketones, ur: 15 mg/dL — AB
NITRITE: POSITIVE — AB
PH: 5.5 (ref 5.0–8.0)
Protein, ur: 30 mg/dL — AB
SPECIFIC GRAVITY, URINE: 1.029 (ref 1.005–1.030)

## 2015-10-26 LAB — COMPREHENSIVE METABOLIC PANEL
ALBUMIN: 3.5 g/dL (ref 3.5–5.0)
ALT: 11 U/L — ABNORMAL LOW (ref 14–54)
ANION GAP: 14 (ref 5–15)
AST: 28 U/L (ref 15–41)
Alkaline Phosphatase: 106 U/L (ref 38–126)
BUN: 15 mg/dL (ref 6–20)
CALCIUM: 11.7 mg/dL — AB (ref 8.9–10.3)
CHLORIDE: 87 mmol/L — AB (ref 101–111)
CO2: 32 mmol/L (ref 22–32)
CREATININE: 1.08 mg/dL — AB (ref 0.44–1.00)
GFR calc non Af Amer: 53 mL/min — ABNORMAL LOW (ref >60)
GLUCOSE: 118 mg/dL — AB (ref 65–99)
POTASSIUM: 3.4 mmol/L — AB (ref 3.5–5.1)
SODIUM: 133 mmol/L — AB (ref 135–145)
TOTAL PROTEIN: 7.6 g/dL (ref 6.5–8.1)
Total Bilirubin: 2.7 mg/dL — ABNORMAL HIGH (ref 0.3–1.2)

## 2015-10-26 LAB — URINE MICROSCOPIC-ADD ON: RBC / HPF: NONE SEEN RBC/hpf (ref 0–5)

## 2015-10-26 LAB — CBC
HEMATOCRIT: 42.1 % (ref 36.0–46.0)
HEMOGLOBIN: 14 g/dL (ref 12.0–15.0)
MCH: 32.9 pg (ref 26.0–34.0)
MCHC: 33.3 g/dL (ref 30.0–36.0)
MCV: 98.8 fL (ref 78.0–100.0)
Platelets: 224 10*3/uL (ref 150–400)
RBC: 4.26 MIL/uL (ref 3.87–5.11)
RDW: 13.5 % (ref 11.5–15.5)
WBC: 9.7 10*3/uL (ref 4.0–10.5)

## 2015-10-26 LAB — LIPASE, BLOOD: LIPASE: 181 U/L — AB (ref 11–51)

## 2015-10-26 MED ORDER — ONDANSETRON 4 MG PO TBDP
ORAL_TABLET | ORAL | Status: AC
  Administered 2015-10-26: 4 mg
  Filled 2015-10-26: qty 1

## 2015-10-26 MED ORDER — ONDANSETRON 4 MG PO TBDP: 4.0000 mg | ORAL_TABLET | Freq: Once | ORAL | Status: DC | PRN

## 2015-10-26 NOTE — ED Provider Notes (Signed)
CSN: [id number]     Arrival date & time 10/26/15  1826 History  By signing my name below, I, Bethel BornBritney McCollum, attest that this documentation has been prepared under the direction and in the presence of Tomasita CrumbleAdeleke Sharry Beining, MD. Electronically Signed: Bethel BornBritney McCollum, ED Scribe. 10/27/2015. 12:27 AM   Chief Complaint  Patient presents with  . Emesis  . Fever  . Abdominal Pain   The history is provided by the patient. No language interpreter was used.   Kathryn [name] is a 65 y.o. female with PMHx of alcoholic liver disease and ascites, pancreatitis, and GERD who presents to the Emergency Department complaining of nausea and vomiting with onset 2 days ago. She has had 3 episodes of emesis over the last 24 hours.  She has been unable to hold down food or water and notes that her urine has been darker than usual. Associated symptoms include abdominal pain and lower back pain. She states that she has had a low grade fever, her temperature usually runs near 6497 F  but has been 7399 F today. Pt denies diarrhea.   Past Medical History  Diagnosis Date  . Osteoporosis   . Alcoholic liver disease (HCC)   . High cholesterol   . Ascites 09/22/11    "first time for , now resolved  . Pancreatitis several years ago  . History of blood transfusion 07/2011    "when I had elbow surgery"  . GERD (gastroesophageal reflux disease)   . Staph infection     left foot  . Fracture, humerus 07/2011    right  . Dysrhythmia     "irregular"  . Arthritis     "all over"  . Peripheral vascular disease (HCC)   . Hypertension     not current  . Osteomyelitis (HCC)     left foot  . Myocardial infarction [facility](HCC)     NSTEMI 12/2011 in setting of septic shock   Past Surgical History  Procedure Laterality Date  . Amputation      two toes on left foot  . Bunionectomy      both feet  . Arthroscopic repair acl      right knee  . Orif humerus fracture  07/26/2011    Procedure: OPEN REDUCTION INTERNAL FIXATION (ORIF) DISTAL HUMERUS  FRACTURE;  Surgeon: Budd PalmerMichael H Handy, MD;  Location: [facility] OR;  Service: Orthopedics;  Laterality: Right;  . Total hip arthroplasty  ~ 2003    right  . Tubal ligation  1980  . Paracentesis  09/22/11    2.2L  . Esophagogastroduodenoscopy  09/23/2011    Procedure: ESOPHAGOGASTRODUODENOSCOPY (EGD);  Surgeon: Shirley FriarVincent C. Schooler, MD;  [facility];  Service: Endoscopy;  Laterality: N/A;  . Transmetatarsal amputation  02/16/2012    Procedure: TRANSMETATARSAL AMPUTATION;  Surgeon: Nadara MustardMarcus V Duda, MD;  Location: [facility] OR;  Service: Orthopedics;  Laterality: Left;  . Fracture surgery  2013    Right elbow  . Joint replacement      right hip   . Esophagogastroduodenoscopy (egd) with propofol N/A 08/22/2012    Procedure: ESOPHAGOGASTRODUODENOSCOPY (EGD) WITH PROPOFOL;  Surgeon: Willis ModenaWilliam Outlaw, MD;  Location: [facility] ENDOSCOPY;  Service: Endoscopy;  Laterality: N/A;  . Leg surgery Left     Correction of unequal leg length  . Orif wrist fracture Left 12/26/2014    Procedure: OPEN REDUCTION INTERNAL FIXATION (ORIF) WRIST FRACTURE;  Surgeon: Cammy CopaScott Gregory Dean, MD;  Location: [facility] OR;  Service: Orthopedics;  Laterality: Left;  . Eus N/A 03/25/2015  Procedure: UPPER ENDOSCOPIC ULTRASOUND (EUS) RADIAL;  Surgeon: Willis Modena, MD;  Location: [facility] ENDOSCOPY;  Service: Endoscopy;  Laterality: N/A;  . Fine needle aspiration N/A 03/25/2015    Procedure: FINE NEEDLE ASPIRATION (FNA) RADIAL;  Surgeon: Willis Modena, MD;  Location: [facility] ENDOSCOPY;  Service: Endoscopy;  Laterality: N/A;   History reviewed. No pertinent family history. Social History  Substance Use Topics  . Smoking status: Never Smoker   . Smokeless tobacco: Never Used  . Alcohol Use: No     Comment: "last drink was day before OR in 07/2011"   OB History    No data available     Review of Systems  10 Systems reviewed and all are negative for acute change except as noted in the HPI.  Allergies  Codeine and Tramadol  Home Medications   Prior to  Admission medications   Medication Sig Start Date End Date Taking? Authorizing Provider  alendronate (FOSAMAX) 70 MG tablet Take 70 mg by mouth once a week. Saturday 03/04/15  Yes Historical Provider, MD  allopurinol (ZYLOPRIM) 100 MG tablet Take 100 mg by mouth 2 (two) times daily.   Yes Historical Provider, MD  Calcium Carb-Cholecalciferol (CALCIUM 600 + D PO) Take 1 tablet by mouth 2 (two) times daily.   Yes Historical Provider, MD  colchicine 0.6 MG tablet Take 0.6 mg by mouth 2 (two) times daily as needed (gout).   Yes Historical Provider, MD  folic acid (FOLVITE) 1 MG tablet Take 1 tablet (1 mg total) by mouth daily. Patient taking differently: Take 400 mcg by mouth daily.  04/20/12  Yes Brent Bulla, MD  furosemide (LASIX) 40 MG tablet Take 1 tablet (40 mg total) by mouth daily. 12/26/14  Yes Scott Rise Paganini, MD  gabapentin (NEURONTIN) 100 MG capsule Take 200 mg by mouth 3 (three) times daily as needed. Nerve pain 02/10/15  Yes Historical Provider, MD  HYDROmorphone (DILAUDID) 2 MG tablet Take 0.5-1 tablets (1-2 mg total) by mouth every 4 (four) hours as needed for severe pain. 04/02/15  Yes Shanker Levora Dredge, MD  mirtazapine (REMERON) 15 MG tablet Take 1 tablet (15 mg total) by mouth at bedtime. 03/26/12  Yes Brent Bulla, MD  propranolol (INDERAL) 10 MG tablet Take 1 tablet (10 mg total) by mouth 2 (two) times daily. 03/26/12  Yes Brent Bulla, MD  spironolactone (ALDACTONE) 50 MG tablet Take 50 mg by mouth daily.   Yes Historical Provider, MD  thiamine (VITAMIN B-1) 100 MG tablet Take 1 tablet (100 mg total) by mouth daily. Patient taking differently: Take 250 mg by mouth daily.  04/20/12  Yes Brent Bulla, MD   BP 124/95 mmHg  Pulse 98  Temp(Src) 99.1 F (37.3 C) (Oral)  Resp 19  Ht 4\' 11"  (1.499 m)  Wt 105 lb (47.628 kg)  BMI 21.20 kg/m2  SpO2 100% Physical Exam  Constitutional: She is oriented to person, place, and time. She appears well-developed and well-nourished. No distress.   HENT:  Head: Normocephalic and atraumatic.  Nose: Nose normal.  Mouth/Throat: Oropharynx is clear and moist. No oropharyngeal exudate.  Poor dentition   Eyes: Conjunctivae and EOM are normal. Pupils are equal, round, and reactive to light. No scleral icterus.  Neck: Normal range of motion. Neck supple. No JVD present. No tracheal deviation present. No thyromegaly present.  Cardiovascular: Regular rhythm and normal heart sounds.  Tachycardia present.  Exam reveals no gallop and no friction rub.   No murmur heard. Pulmonary/Chest: Effort  normal and breath sounds normal. No respiratory distress. She has no wheezes. She exhibits no tenderness.  Abdominal: Soft. Bowel sounds are normal. She exhibits no distension and no mass. There is tenderness in the periumbilical area. There is no rebound and no guarding.  Musculoskeletal: Normal range of motion. She exhibits no edema or tenderness.  Lymphadenopathy:    She has no cervical adenopathy.  Neurological: She is alert and oriented to person, place, and time. No cranial nerve deficit. She exhibits normal muscle tone.  Skin: Skin is warm and dry. No rash noted. No erythema. No pallor.  Nursing note and vitals reviewed.   ED Course  Procedures (including critical care time) DIAGNOSTIC STUDIES: Oxygen Saturation is 100% on RA,  normal by my interpretation.    COORDINATION OF CARE: 12:20 AM Discussed treatment plan which includes lab work, abx, IVF, and pain medication with pt at bedside and pt agreed to plan.  Labs Review Labs Reviewed  LIPASE, BLOOD - Abnormal; Notable for the following:    Lipase 181 (*)    All other components within normal limits  COMPREHENSIVE METABOLIC PANEL - Abnormal; Notable for the following:    Sodium 133 (*)    Potassium 3.4 (*)    Chloride 87 (*)    Glucose, Bld 118 (*)    Creatinine, Ser 1.08 (*)    Calcium 11.7 (*)    ALT 11 (*)    Total Bilirubin 2.7 (*)    GFR calc non Af Amer 53 (*)    All other  components within normal limits  URINALYSIS, ROUTINE W REFLEX MICROSCOPIC (NOT AT [facility]) - Abnormal; Notable for the following:    Color, Urine ORANGE (*)    APPearance TURBID (*)    Bilirubin Urine MODERATE (*)    Ketones, ur 15 (*)    Protein, ur 30 (*)    Nitrite POSITIVE (*)    Leukocytes, UA SMALL (*)    All other components within normal limits  URINE MICROSCOPIC-ADD ON - Abnormal; Notable for the following:    Squamous Epithelial / LPF 6-30 (*)    Bacteria, UA MANY (*)    All other components within normal limits  CBC    Imaging Review No results found. I have personally reviewed and evaluated these lab results as part of my medical decision-making.   EKG Interpretation None      MDM   Final diagnoses:  None   Patient presents to the ED for abd pain she states is consistent with pancreatitis.  Lipase is elevated.  She denies alcohol although there is a history.  States only dilaudid works.  No concern for drug seeking behavior here.  She was given IVF, zofran, dilaudid x 2 doses.  She was then able to tolerate oral.  She appears well and in NAD.  PCP fu advised.  DC home with 6 tabs of dilaudid to take as needed.  VS remain within her normal limits and she is safe for DC.   I personally performed the services described in this documentation, which was scribed in my presence. The recorded information has been reviewed and is accurate.     Tomasita Crumble, MD 10/27/15 1426

## 2015-10-26 NOTE — ED Notes (Signed)
Pt c/o emesis and fever for two days. Pt states she is unable to take her medications due to her n/v. Emesis x 3 today. Pt has hx of pancreatitis

## 2015-10-27 MED ORDER — HYDROMORPHONE HCL 1 MG/ML IJ SOLN
1.0000 mg | Freq: Once | INTRAMUSCULAR | Status: AC
  Administered 2015-10-27: 1 mg via INTRAVENOUS
  Filled 2015-10-27: qty 1

## 2015-10-27 MED ORDER — CEPHALEXIN 500 MG PO CAPS: 500.0000 mg | ORAL_CAPSULE | Freq: Two times a day (BID) | ORAL | Status: DC

## 2015-10-27 MED ORDER — HYDROMORPHONE HCL 2 MG PO TABS: 2.0000 mg | ORAL_TABLET | Freq: Two times a day (BID) | ORAL | Status: DC | PRN

## 2015-10-27 MED ORDER — ONDANSETRON HCL 4 MG/2ML IJ SOLN
4.0000 mg | Freq: Once | INTRAMUSCULAR | Status: AC
  Administered 2015-10-27: 4 mg via INTRAVENOUS
  Filled 2015-10-27: qty 2

## 2015-10-27 MED ORDER — DEXTROSE 5 % IV SOLN
1.0000 g | Freq: Once | INTRAVENOUS | Status: AC
  Administered 2015-10-27: 1 g via INTRAVENOUS
  Filled 2015-10-27: qty 10

## 2015-10-27 MED ORDER — SODIUM CHLORIDE 0.9 % IV BOLUS (SEPSIS)
1000.0000 mL | Freq: Once | INTRAVENOUS | Status: AC
  Administered 2015-10-27: 1000 mL via INTRAVENOUS

## 2015-10-27 NOTE — ED Notes (Addendum)
Pt was given a ginger ale and grahamcrackers with peanut butter.

## 2015-10-27 NOTE — ED Notes (Signed)
Patient placed on 2L O2 nasal cannula d/t SpO2 decreasing to 80% after receiving dilaudid.

## 2015-10-27 NOTE — ED Notes (Signed)
Patient verbalized understanding of discharge instructions and denies any further needs or questions at this time. VS stable. Patient ambulatory with steady gait, escorted to ED entrance in wheelchair, where a cab was called for her.

## 2015-10-27 NOTE — ED Notes (Signed)
Patient tolerated snack and ginger ale given earlier. Denies N/V at this time.

## 2015-10-27 NOTE — Discharge Instructions (Signed)
Urinary Tract Infection [name], take antibiotics as directed and see your primary doctor within 3 days for close follow up.  Maintain a liquid diet for the next 3 days so you do not make your pancreatitis worse.  If any symptoms worsen, come back to the ED immediately. Thank you. A urinary tract infection (UTI) can occur any place along the urinary tract. The tract includes the kidneys, ureters, bladder, and urethra. A type of germ called bacteria often causes a UTI. UTIs are often helped with antibiotic medicine.  HOME CARE   If given, take antibiotics as told by your doctor. Finish them even if you start to feel better.  Drink enough fluids to keep your pee (urine) clear or pale yellow.  Avoid tea, drinks with caffeine, and bubbly (carbonated) drinks.  Pee often. Avoid holding your pee in for a long time.  Pee before and after having sex (intercourse).  Wipe from front to back after you poop (bowel movement) if you are a woman. Use each tissue only once. GET HELP RIGHT AWAY IF:   You have back pain.  You have lower belly (abdominal) pain.  You have chills.  You feel sick to your stomach (nauseous).  You throw up (vomit).  Your burning or discomfort with peeing does not go away.  You have a fever.  Your symptoms are not better in 3 days. MAKE SURE YOU:   Understand these instructions.  Will watch your condition.  Will get help right away if you are not doing well or get worse.   This information is not intended to replace advice given to you by your health care provider. Make sure you discuss any questions you have with your health care provider.   Document Released: 09/21/2007 Document Revised: 04/25/2014 Document Reviewed: 11/03/2011 Elsevier Interactive Patient Education 2016 [employer] Acute Pancreatitis Acute pancreatitis is a disease in which the pancreas becomes suddenly irritated (inflamed). The pancreas is a large gland behind your stomach. The pancreas  makes enzymes that help digest food. The pancreas also makes 2 hormones that help control your blood sugar. Acute pancreatitis happens when the enzymes attack and damage the pancreas. Most attacks last a couple of days and can cause serious problems. HOME CARE  Follow your doctor's diet instructions. You may need to avoid alcohol and limit fat in your diet.  Eat small meals often.  Drink enough fluids to keep your pee (urine) clear or pale yellow.  Only take medicines as told by your doctor.  Avoid drinking alcohol if it caused your disease.  Do not smoke.  Get plenty of rest.  Check your blood sugar at home as told by your doctor.  Keep all doctor visits as told. GET HELP IF:  You do not get better as quickly as expected.  You have new or worsening symptoms.  You have lasting pain, weakness, or feel sick to your stomach (nauseous).  You get better and then have another pain attack. GET HELP RIGHT AWAY IF:   You are unable to eat or keep fluids down.  Your pain becomes severe.  You have a fever or lasting symptoms for more than 2 to 3 days.  You have a fever and your symptoms suddenly get worse.  Your skin or the white part of your eyes turn yellow (jaundice).  You throw up (vomit).  You feel dizzy, or you pass out (faint).  Your blood sugar is high (over 300 mg/dL). MAKE SURE YOU:   Understand these  instructions.  Will watch your condition.  Will get help right away if you are not doing well or get worse.   This information is not intended to replace advice given to you by your health care provider. Make sure you discuss any questions you have with your health care provider.   Document Released: 09/21/2007 Document Revised: 04/25/2014 Document Reviewed: 07/14/2011 Elsevier Interactive Patient Education [employer]

## 2015-10-29 DIAGNOSIS — N3000 Acute cystitis without hematuria: Secondary | ICD-10-CM | POA: Diagnosis not present

## 2015-10-29 DIAGNOSIS — K859 Acute pancreatitis without necrosis or infection, unspecified: Secondary | ICD-10-CM | POA: Diagnosis not present

## 2015-11-27 DIAGNOSIS — K7460 Unspecified cirrhosis of liver: Secondary | ICD-10-CM | POA: Diagnosis not present

## 2015-11-27 DIAGNOSIS — K861 Other chronic pancreatitis: Secondary | ICD-10-CM | POA: Diagnosis not present

## 2015-11-27 DIAGNOSIS — E876 Hypokalemia: Secondary | ICD-10-CM | POA: Diagnosis not present

## 2015-12-18 DIAGNOSIS — Z1231 Encounter for screening mammogram for malignant neoplasm of breast: Secondary | ICD-10-CM | POA: Diagnosis not present

## 2016-01-07 DIAGNOSIS — G2581 Restless legs syndrome: Secondary | ICD-10-CM | POA: Diagnosis not present

## 2016-01-07 DIAGNOSIS — Z0000 Encounter for general adult medical examination without abnormal findings: Secondary | ICD-10-CM | POA: Diagnosis not present

## 2016-01-07 DIAGNOSIS — E7800 Pure hypercholesterolemia, unspecified: Secondary | ICD-10-CM | POA: Diagnosis not present

## 2016-01-07 DIAGNOSIS — M810 Age-related osteoporosis without current pathological fracture: Secondary | ICD-10-CM | POA: Diagnosis not present

## 2016-01-07 DIAGNOSIS — Z23 Encounter for immunization: Secondary | ICD-10-CM | POA: Diagnosis not present

## 2016-01-12 DIAGNOSIS — R112 Nausea with vomiting, unspecified: Secondary | ICD-10-CM | POA: Diagnosis not present

## 2016-02-18 ENCOUNTER — Emergency Department (HOSPITAL_COMMUNITY): Payer: Commercial Managed Care - HMO

## 2016-02-18 ENCOUNTER — Inpatient Hospital Stay (HOSPITAL_COMMUNITY)
Admission: EM | Admit: 2016-02-18 | Discharge: 2016-02-25 | DRG: 442 | Disposition: A | Discharge status: Home Health | Payer: Commercial Managed Care - HMO | Attending: Internal Medicine | Admitting: Internal Medicine

## 2016-02-18 ENCOUNTER — Encounter (HOSPITAL_COMMUNITY): Payer: Self-pay | Admitting: *Deleted

## 2016-02-18 DIAGNOSIS — F1020 Alcohol dependence, uncomplicated: Secondary | ICD-10-CM | POA: Diagnosis present

## 2016-02-18 DIAGNOSIS — Z781 Physical restraint status: Secondary | ICD-10-CM

## 2016-02-18 DIAGNOSIS — Z96641 Presence of right artificial hip joint: Secondary | ICD-10-CM | POA: Diagnosis present

## 2016-02-18 DIAGNOSIS — F329 Major depressive disorder, single episode, unspecified: Secondary | ICD-10-CM | POA: Diagnosis present

## 2016-02-18 DIAGNOSIS — R188 Other ascites: Secondary | ICD-10-CM | POA: Diagnosis not present

## 2016-02-18 DIAGNOSIS — I9589 Other hypotension: Secondary | ICD-10-CM

## 2016-02-18 DIAGNOSIS — E785 Hyperlipidemia, unspecified: Secondary | ICD-10-CM | POA: Diagnosis present

## 2016-02-18 DIAGNOSIS — K7210 Chronic hepatic failure without coma: Secondary | ICD-10-CM | POA: Diagnosis not present

## 2016-02-18 DIAGNOSIS — I252 Old myocardial infarction: Secondary | ICD-10-CM | POA: Diagnosis not present

## 2016-02-18 DIAGNOSIS — R4182 Altered mental status, unspecified: Secondary | ICD-10-CM | POA: Diagnosis not present

## 2016-02-18 DIAGNOSIS — N179 Acute kidney failure, unspecified: Secondary | ICD-10-CM | POA: Diagnosis present

## 2016-02-18 DIAGNOSIS — K709 Alcoholic liver disease, unspecified: Secondary | ICD-10-CM | POA: Diagnosis not present

## 2016-02-18 DIAGNOSIS — M109 Gout, unspecified: Secondary | ICD-10-CM | POA: Diagnosis present

## 2016-02-18 DIAGNOSIS — D638 Anemia in other chronic diseases classified elsewhere: Secondary | ICD-10-CM | POA: Diagnosis present

## 2016-02-18 DIAGNOSIS — K7030 Alcoholic cirrhosis of liver without ascites: Secondary | ICD-10-CM | POA: Diagnosis present

## 2016-02-18 DIAGNOSIS — N19 Unspecified kidney failure: Secondary | ICD-10-CM | POA: Diagnosis not present

## 2016-02-18 DIAGNOSIS — I959 Hypotension, unspecified: Secondary | ICD-10-CM

## 2016-02-18 DIAGNOSIS — I739 Peripheral vascular disease, unspecified: Secondary | ICD-10-CM | POA: Diagnosis present

## 2016-02-18 DIAGNOSIS — K219 Gastro-esophageal reflux disease without esophagitis: Secondary | ICD-10-CM | POA: Diagnosis not present

## 2016-02-18 DIAGNOSIS — F1029 Alcohol dependence with unspecified alcohol-induced disorder: Secondary | ICD-10-CM

## 2016-02-18 DIAGNOSIS — Z888 Allergy status to other drugs, medicaments and biological substances status: Secondary | ICD-10-CM | POA: Diagnosis not present

## 2016-02-18 DIAGNOSIS — M810 Age-related osteoporosis without current pathological fracture: Secondary | ICD-10-CM | POA: Diagnosis present

## 2016-02-18 DIAGNOSIS — M419 Scoliosis, unspecified: Secondary | ICD-10-CM | POA: Diagnosis not present

## 2016-02-18 DIAGNOSIS — E860 Dehydration: Secondary | ICD-10-CM | POA: Diagnosis present

## 2016-02-18 DIAGNOSIS — Z79899 Other long term (current) drug therapy: Secondary | ICD-10-CM | POA: Diagnosis not present

## 2016-02-18 DIAGNOSIS — G894 Chronic pain syndrome: Secondary | ICD-10-CM | POA: Diagnosis present

## 2016-02-18 DIAGNOSIS — I2510 Atherosclerotic heart disease of native coronary artery without angina pectoris: Secondary | ICD-10-CM | POA: Diagnosis present

## 2016-02-18 DIAGNOSIS — R440 Auditory hallucinations: Secondary | ICD-10-CM | POA: Diagnosis not present

## 2016-02-18 DIAGNOSIS — R441 Visual hallucinations: Secondary | ICD-10-CM | POA: Diagnosis not present

## 2016-02-18 DIAGNOSIS — G8929 Other chronic pain: Secondary | ICD-10-CM | POA: Diagnosis not present

## 2016-02-18 DIAGNOSIS — I129 Hypertensive chronic kidney disease with stage 1 through stage 4 chronic kidney disease, or unspecified chronic kidney disease: Secondary | ICD-10-CM | POA: Diagnosis not present

## 2016-02-18 DIAGNOSIS — K7290 Hepatic failure, unspecified without coma: Secondary | ICD-10-CM

## 2016-02-18 DIAGNOSIS — K7682 Hepatic encephalopathy: Secondary | ICD-10-CM | POA: Diagnosis present

## 2016-02-18 DIAGNOSIS — N183 Chronic kidney disease, stage 3 (moderate): Secondary | ICD-10-CM | POA: Diagnosis present

## 2016-02-18 LAB — CBC WITH DIFFERENTIAL/PLATELET
BASOS ABS: 0 10*3/uL (ref 0.0–0.1)
Basophils Relative: 0 %
EOS ABS: 0.1 10*3/uL (ref 0.0–0.7)
Eosinophils Relative: 1 %
HCT: 35.2 % — ABNORMAL LOW (ref 36.0–46.0)
HEMOGLOBIN: 11.3 g/dL — AB (ref 12.0–15.0)
LYMPHS ABS: 1.7 10*3/uL (ref 0.7–4.0)
LYMPHS PCT: 23 %
MCH: 33.5 pg (ref 26.0–34.0)
MCHC: 32.1 g/dL (ref 30.0–36.0)
MCV: 104.5 fL — AB (ref 78.0–100.0)
Monocytes Absolute: 0.5 10*3/uL (ref 0.1–1.0)
Monocytes Relative: 6 %
NEUTROS PCT: 70 %
Neutro Abs: 5.4 10*3/uL (ref 1.7–7.7)
PLATELETS: 209 10*3/uL (ref 150–400)
RBC: 3.37 MIL/uL — AB (ref 3.87–5.11)
RDW: 14.2 % (ref 11.5–15.5)
WBC: 7.7 10*3/uL (ref 4.0–10.5)

## 2016-02-18 LAB — COMPREHENSIVE METABOLIC PANEL
ALT: 12 U/L — ABNORMAL LOW (ref 14–54)
ANION GAP: 18 — AB (ref 5–15)
AST: 36 U/L (ref 15–41)
Albumin: 3.2 g/dL — ABNORMAL LOW (ref 3.5–5.0)
Alkaline Phosphatase: 151 U/L — ABNORMAL HIGH (ref 38–126)
BUN: 34 mg/dL — ABNORMAL HIGH (ref 6–20)
CHLORIDE: 97 mmol/L — AB (ref 101–111)
CO2: 21 mmol/L — AB (ref 22–32)
Calcium: 8.9 mg/dL (ref 8.9–10.3)
Creatinine, Ser: 2.51 mg/dL — ABNORMAL HIGH (ref 0.44–1.00)
GFR, EST AFRICAN AMERICAN: 22 mL/min — AB (ref >60)
GFR, EST NON AFRICAN AMERICAN: 19 mL/min — AB (ref >60)
Glucose, Bld: 83 mg/dL (ref 65–99)
Potassium: 4.3 mmol/L (ref 3.5–5.1)
SODIUM: 136 mmol/L (ref 135–145)
Total Bilirubin: 1.2 mg/dL (ref 0.3–1.2)
Total Protein: 7.3 g/dL (ref 6.5–8.1)

## 2016-02-18 LAB — URINE MICROSCOPIC-ADD ON: RBC / HPF: NONE SEEN RBC/hpf (ref 0–5)

## 2016-02-18 LAB — URINALYSIS, ROUTINE W REFLEX MICROSCOPIC
Bilirubin Urine: NEGATIVE
Glucose, UA: NEGATIVE mg/dL
HGB URINE DIPSTICK: NEGATIVE
Ketones, ur: NEGATIVE mg/dL
Nitrite: NEGATIVE
PROTEIN: NEGATIVE mg/dL
SPECIFIC GRAVITY, URINE: 1.008 (ref 1.005–1.030)
pH: 5.5 (ref 5.0–8.0)

## 2016-02-18 LAB — AMMONIA: Ammonia: 76 umol/L — ABNORMAL HIGH (ref 9–35)

## 2016-02-18 LAB — RAPID URINE DRUG SCREEN, HOSP PERFORMED
Amphetamines: NOT DETECTED
Barbiturates: NOT DETECTED
Benzodiazepines: NOT DETECTED
COCAINE: NOT DETECTED
OPIATES: NOT DETECTED
TETRAHYDROCANNABINOL: NOT DETECTED

## 2016-02-18 LAB — I-STAT CG4 LACTIC ACID, ED
LACTIC ACID, VENOUS: 0.99 mmol/L (ref 0.5–1.9)
LACTIC ACID, VENOUS: 1.4 mmol/L (ref 0.5–1.9)

## 2016-02-18 LAB — ETHANOL

## 2016-02-18 LAB — I-STAT TROPONIN, ED: Troponin i, poc: 0.01 ng/mL (ref 0.00–0.08)

## 2016-02-18 LAB — MAGNESIUM: MAGNESIUM: 1 mg/dL — AB (ref 1.7–2.4)

## 2016-02-18 LAB — PHOSPHORUS: Phosphorus: 3.5 mg/dL (ref 2.5–4.6)

## 2016-02-18 MED ORDER — SODIUM CHLORIDE 0.9 % IV BOLUS (SEPSIS)
1000.0000 mL | Freq: Once | INTRAVENOUS | Status: AC
  Administered 2016-02-18: 1000 mL via INTRAVENOUS

## 2016-02-18 MED ORDER — MIRTAZAPINE 15 MG PO TABS
15.0000 mg | ORAL_TABLET | Freq: Every day | ORAL | Status: DC
Start: 2016-02-18 — End: 2016-02-25
  Administered 2016-02-18 – 2016-02-24 (×6): 15 mg via ORAL
  Filled 2016-02-18 (×6): qty 1

## 2016-02-18 MED ORDER — LORAZEPAM 1 MG PO TABS: 1.0000 mg | ORAL_TABLET | Freq: Four times a day (QID) | ORAL | Status: DC | PRN

## 2016-02-18 MED ORDER — VITAMIN B-1 100 MG PO TABS
100.0000 mg | ORAL_TABLET | Freq: Every day | ORAL | Status: DC
  Administered 2016-02-19 – 2016-02-25 (×7): 100 mg via ORAL
  Filled 2016-02-18 (×7): qty 1

## 2016-02-18 MED ORDER — ONDANSETRON HCL 4 MG/2ML IJ SOLN
4.0000 mg | Freq: Four times a day (QID) | INTRAMUSCULAR | Status: DC | PRN
  Administered 2016-02-23 (×2): 4 mg via INTRAVENOUS
  Filled 2016-02-18 (×3): qty 2

## 2016-02-18 MED ORDER — HEPARIN SODIUM (PORCINE) 5000 UNIT/ML IJ SOLN
5000.0000 [IU] | Freq: Three times a day (TID) | INTRAMUSCULAR | Status: DC
  Administered 2016-02-18 – 2016-02-25 (×20): 5000 [IU] via SUBCUTANEOUS
  Filled 2016-02-18 (×20): qty 1

## 2016-02-18 MED ORDER — SODIUM CHLORIDE 0.9 % IV SOLN
INTRAVENOUS | Status: DC
  Administered 2016-02-18 – 2016-02-24 (×6): via INTRAVENOUS

## 2016-02-18 MED ORDER — LACTULOSE 10 GM/15ML PO SOLN
30.0000 g | Freq: Once | ORAL | Status: AC
  Administered 2016-02-18: 30 g via ORAL
  Filled 2016-02-18: qty 45

## 2016-02-18 MED ORDER — LACTULOSE 10 GM/15ML PO SOLN
30.0000 g | Freq: Three times a day (TID) | ORAL | Status: DC
  Administered 2016-02-18 – 2016-02-25 (×17): 30 g via ORAL
  Filled 2016-02-18 (×18): qty 45

## 2016-02-18 MED ORDER — ADULT MULTIVITAMIN W/MINERALS CH
1.0000 | ORAL_TABLET | Freq: Every day | ORAL | Status: DC
  Administered 2016-02-19 – 2016-02-25 (×7): 1 via ORAL
  Filled 2016-02-18 (×7): qty 1

## 2016-02-18 MED ORDER — SODIUM CHLORIDE 0.9% FLUSH
3.0000 mL | Freq: Two times a day (BID) | INTRAVENOUS | Status: DC
  Administered 2016-02-19 – 2016-02-25 (×7): 3 mL via INTRAVENOUS

## 2016-02-18 MED ORDER — LORAZEPAM 2 MG/ML IJ SOLN
1.0000 mg | Freq: Four times a day (QID) | INTRAMUSCULAR | Status: DC | PRN
  Administered 2016-02-20 – 2016-02-21 (×4): 1 mg via INTRAVENOUS
  Filled 2016-02-18 (×4): qty 1

## 2016-02-18 MED ORDER — FOLIC ACID 1 MG PO TABS
1.0000 mg | ORAL_TABLET | Freq: Every day | ORAL | Status: DC
  Administered 2016-02-19 – 2016-02-25 (×8): 1 mg via ORAL
  Filled 2016-02-18 (×7): qty 1

## 2016-02-18 MED ORDER — OXYCODONE HCL 5 MG PO TABS
5.0000 mg | ORAL_TABLET | ORAL | Status: DC | PRN
  Filled 2016-02-18: qty 1

## 2016-02-18 MED ORDER — THIAMINE HCL 100 MG/ML IJ SOLN: 100.0000 mg | Freq: Every day | INTRAMUSCULAR | Status: DC

## 2016-02-18 MED ORDER — ONDANSETRON HCL 4 MG PO TABS: 4.0000 mg | ORAL_TABLET | Freq: Four times a day (QID) | ORAL | Status: DC | PRN

## 2016-02-18 NOTE — ED Notes (Addendum)
Pt's boyfriend called pt through the RN's phone. Phone taken to pt and retrieved after call was complete. Pt stated that she feels much better after using the RR . Pt in NAD and resting comfortably.

## 2016-02-18 NOTE — ED Triage Notes (Signed)
Per family member, pt having auditory and visual hallucinations x 3 days. Only recent complaint was having a fall two days ago but did not hit her head and also had recent dental pain. Denies urinary symptoms or cough. Hypotensive at triage.

## 2016-02-18 NOTE — ED Notes (Signed)
Attempted report x1. 

## 2016-02-18 NOTE — ED Notes (Signed)
Patient transported to CT 

## 2016-02-18 NOTE — ED Notes (Signed)
Dr. Silverio LayYao aware of pt SBP in 70-80's

## 2016-02-18 NOTE — ED Notes (Signed)
Pt meal tray ordered.

## 2016-02-18 NOTE — ED Notes (Signed)
Pt returned to room. RN at bedside

## 2016-02-18 NOTE — H&P (Signed)
History and Physical    Kathryn Livingnn B Geisel ZOX:[id number] DOB: 14-Jun-1950 DOA: 02/18/2016  PCP: [name], MD Patient coming from: home  Chief Complaint: COnfusion  HPI: Kathryn [name] is a 65 y.o. female with medical history significant of alcoholic cirrhosis, GERD, HLD, HTN, MI, osteomyelitis of the left foot status post amputation, pancreatitis, peripheral vascular disease presenting w/ visual hallucinations and general is confusion. Level V caveat applies as patient is unable to provide history. Family not present at time of admission. History provided by patient, EDP, and nursing reports. Patient has approximately 1-2 day history of visual hallucinations. She reports seeing people crawling under her house, place and to trucks towing multiple cars out of her driveway, staying up all night watching people in her driveway. Patient does endorse that these people to talk to her. No history of psychiatric problems. Denies any focal complaints such as fevers, abdominal swelling, abdominal pain, dysuria, frequency, back pain, rash, neck stiffness, headache, cough, chest pain. Patient without recollection of these events. Patient does recall several occurrences over the past week of certain activities that seemed bizarre such as filling up a cup of water and placing on the counter, turning around to do something else and when she turns back the water is gone. Patient isof these events. Patient has no recollection of reports of hallucinations. Patient states that she administers her own medications.  ED Course: Objective findings outlined below. Lactulose given to reverse elevated ammonia level. Fluids given for hypotension.  Review of Systems: As per HPI otherwise 10 point review of systems negative.   Ambulatory Status: No restrictions  Past Medical History:  Diagnosis Date  . Alcoholic liver disease (HCC)   . Arthritis    "all over"  . Ascites 09/22/11   "first time for , now resolved  . Dysrhythmia      "irregular"  . Fracture, humerus 07/2011   right  . GERD (gastroesophageal reflux disease)   . High cholesterol   . History of blood transfusion 07/2011   "when I had elbow surgery"  . Hypertension    not current  . Myocardial infarction    NSTEMI 12/2011 in setting of septic shock  . Osteomyelitis (HCC)    left foot  . Osteoporosis   . Pancreatitis several years ago  . Peripheral vascular disease (HCC)   . Staph infection    left foot    Past Surgical History:  Procedure Laterality Date  . AMPUTATION     two toes on left foot  . ARTHROSCOPIC REPAIR ACL     right knee  . BUNIONECTOMY     both feet  . ESOPHAGOGASTRODUODENOSCOPY  09/23/2011   Procedure: ESOPHAGOGASTRODUODENOSCOPY (EGD);  Surgeon: Shirley FriarVincent C. Schooler, MD;  [facility];  Service: Endoscopy;  Laterality: N/A;  . ESOPHAGOGASTRODUODENOSCOPY (EGD) WITH PROPOFOL N/A 08/22/2012   Procedure: ESOPHAGOGASTRODUODENOSCOPY (EGD) WITH PROPOFOL;  Surgeon: Willis ModenaWilliam Outlaw, MD;  Location: [facility] ENDOSCOPY;  Service: Endoscopy;  Laterality: N/A;  . EUS N/A 03/25/2015   Procedure: UPPER ENDOSCOPIC ULTRASOUND (EUS) RADIAL;  Surgeon: Willis ModenaWilliam Outlaw, MD;  Location: [facility] ENDOSCOPY;  Service: Endoscopy;  Laterality: N/A;  . FINE NEEDLE ASPIRATION N/A 03/25/2015   Procedure: FINE NEEDLE ASPIRATION (FNA) RADIAL;  Surgeon: Willis ModenaWilliam Outlaw, MD;  Location: [facility] ENDOSCOPY;  Service: Endoscopy;  Laterality: N/A;  . FRACTURE SURGERY  2013   Right elbow  . JOINT REPLACEMENT     right hip   . LEG SURGERY Left    Correction of unequal leg length  .  ORIF HUMERUS FRACTURE  07/26/2011   Procedure: OPEN REDUCTION INTERNAL FIXATION (ORIF) DISTAL HUMERUS FRACTURE;  Surgeon: Budd Palmer, MD;  Location: [facility] OR;  Service: Orthopedics;  Laterality: Right;  . ORIF WRIST FRACTURE Left 12/26/2014   Procedure: OPEN REDUCTION INTERNAL FIXATION (ORIF) WRIST FRACTURE;  Surgeon: Cammy Copa, MD;  Location: [facility] OR;  Service: Orthopedics;  Laterality: Left;  .  PARACENTESIS  09/22/11   2.2L  . TOTAL HIP ARTHROPLASTY  ~ 2003   right  . TRANSMETATARSAL AMPUTATION  02/16/2012   Procedure: TRANSMETATARSAL AMPUTATION;  Surgeon: Nadara Mustard, MD;  Location: [facility] OR;  Service: Orthopedics;  Laterality: Left;  . TUBAL LIGATION  1980    Social History   Social History  . Marital status: Widowed    Spouse name: N/A  . Number of children: N/A  . Years of education: N/A   Occupational History  . Not on file.   Social History Main Topics  . Smoking status: Never Smoker  . Smokeless tobacco: Never Used  . Alcohol use No     Comment: "last drink was day before OR in 07/2011"  . Drug use: No  . Sexual activity: Not Currently   Other Topics Concern  . Not on file   Social History Narrative   Widowed 2013   2 children in the area    Allergies  Allergen Reactions  . Codeine Hives    Causes blisters  . Tramadol Other (See Comments)    Loss of consciousness     History reviewed. No pertinent family history.  Prior to Admission medications   Medication Sig Start Date End Date Taking? Authorizing Provider  alendronate (FOSAMAX) 70 MG tablet Take 70 mg by mouth once a week. Saturday 03/04/15  Yes Historical Provider, MD  allopurinol (ZYLOPRIM) 100 MG tablet Take 100 mg by mouth 2 (two) times daily.   Yes Historical Provider, MD  Calcium Carb-Cholecalciferol (CALCIUM 600 + D PO) Take 1 tablet by mouth 2 (two) times daily.   Yes Historical Provider, MD  colchicine 0.6 MG tablet Take 0.6 mg by mouth 2 (two) times daily as needed (gout).   Yes Historical Provider, MD  folic acid (FOLVITE) 1 MG tablet Take 1 tablet (1 mg total) by mouth daily. Patient taking differently: Take 400 mcg by mouth daily.  04/20/12  Yes Brent Bulla, MD  furosemide (LASIX) 40 MG tablet Take 1 tablet (40 mg total) by mouth daily. 12/26/14  Yes Scott Rise Paganini, MD  gabapentin (NEURONTIN) 100 MG capsule Take 200 mg by mouth 3 (three) times daily as needed. Nerve pain 02/10/15   Yes Historical Provider, MD  HYDROmorphone (DILAUDID) 2 MG tablet Take 1 tablet (2 mg total) by mouth 2 (two) times daily as needed for severe pain. 10/27/15  Yes Tomasita Crumble, MD  mirtazapine (REMERON) 15 MG tablet Take 1 tablet (15 mg total) by mouth at bedtime. 03/26/12  Yes Brent Bulla, MD  potassium chloride SA (K-DUR,KLOR-CON) 20 MEQ tablet Take 20 mEq by mouth daily. 01/29/16  Yes Historical Provider, MD  propranolol (INDERAL) 10 MG tablet Take 1 tablet (10 mg total) by mouth 2 (two) times daily. 03/26/12  Yes Brent Bulla, MD  spironolactone (ALDACTONE) 50 MG tablet Take 50 mg by mouth daily.   Yes Historical Provider, MD  thiamine (VITAMIN B-1) 100 MG tablet Take 1 tablet (100 mg total) by mouth daily. Patient taking differently: Take 250 mg by mouth daily.  04/20/12  Yes Rolm Gala  Kathrynn Ducking, MD  cephALEXin (KEFLEX) 500 MG capsule Take 1 capsule (500 mg total) by mouth 2 (two) times daily. Patient not taking: Reported on 02/18/2016 10/27/15   Tomasita Crumble, MD    Physical Exam: Vitals:   02/18/16 1645 02/18/16 1700 02/18/16 1715 02/18/16 1730  BP: 97/83 (!) 88/70 (!) 85/61 (!) 78/50  Pulse: 80  80 98  Resp: 19 16 19 23   Temp:      TempSrc:      SpO2: 95%  98% 97%  Weight:      Height:         General:  Appears calm and comfortable Eyes:  PERRL, EOMI, normal lids, iris ENT:  grossly normal hearing, lips & tongue, mmm Neck:  no LAD, masses or thyromegaly Cardiovascular:  RRR, II/VI systolic murmur. No LE edema.  Respiratory:  CTA bilaterally, no w/r/r. Normal respiratory effort. Abdomen:  soft, ntnd, NABS Skin:  no rash or induration seen on limited exam Musculoskeletal: L midfoot amputation,  grossly normal tone BUE/BLE, good ROM, Psychiatric: AOx3, somewhat tangential in her thought process. Pleasant. Follows basic commands. Unable to give detailed information when asked. Neurologic:  CN 2-12 grossly intact, moves all extremities in coordinated fashion, sensation intact  Labs on  Admission: I have personally reviewed following labs and imaging studies  CBC:  Recent Labs Lab 02/18/16 1158  WBC 7.7  NEUTROABS 5.4  HGB 11.3*  HCT 35.2*  MCV 104.5*  PLT 209   Basic Metabolic Panel:  Recent Labs Lab 02/18/16 1158  NA 136  K 4.3  CL 97*  CO2 21*  GLUCOSE 83  BUN 34*  CREATININE 2.51*  CALCIUM 8.9   GFR: Estimated Creatinine Clearance: 15.6 mL/min (by C-G formula based on SCr of 2.51 mg/dL (H)). Liver Function Tests:  Recent Labs Lab 02/18/16 1158  AST 36  ALT 12*  ALKPHOS 151*  BILITOT 1.2  PROT 7.3  ALBUMIN 3.2*   No results for input(s): LIPASE, AMYLASE in the last 168 hours.  Recent Labs Lab 02/18/16 1213  AMMONIA 76*   Coagulation Profile: No results for input(s): INR, PROTIME in the last 168 hours. Cardiac Enzymes: No results for input(s): CKTOTAL, CKMB, CKMBINDEX, TROPONINI in the last 168 hours. BNP (last 3 results) No results for input(s): PROBNP in the last 8760 hours. HbA1C: No results for input(s): HGBA1C in the last 72 hours. CBG: No results for input(s): GLUCAP in the last 168 hours. Lipid Profile: No results for input(s): CHOL, HDL, LDLCALC, TRIG, CHOLHDL, LDLDIRECT in the last 72 hours. Thyroid Function Tests: No results for input(s): TSH, T4TOTAL, FREET4, T3FREE, THYROIDAB in the last 72 hours. Anemia Panel: No results for input(s): VITAMINB12, FOLATE, FERRITIN, TIBC, IRON, RETICCTPCT in the last 72 hours. Urine analysis:    Component Value Date/Time   COLORURINE YELLOW 02/18/2016 1224   APPEARANCEUR CLOUDY (A) 02/18/2016 1224   LABSPEC 1.008 02/18/2016 1224   PHURINE 5.5 02/18/2016 1224   GLUCOSEU NEGATIVE 02/18/2016 1224   GLUCOSEU NEG mg/dL 45/40/9811 9147   HGBUR NEGATIVE 02/18/2016 1224   HGBUR negative 03/17/2008 1240   BILIRUBINUR NEGATIVE 02/18/2016 1224   KETONESUR NEGATIVE 02/18/2016 1224   PROTEINUR NEGATIVE 02/18/2016 1224   UROBILINOGEN 0.2 12/30/2013 1251   NITRITE NEGATIVE 02/18/2016 1224    LEUKOCYTESUR SMALL (A) 02/18/2016 1224    Creatinine Clearance: Estimated Creatinine Clearance: 15.6 mL/min (by C-G formula based on SCr of 2.51 mg/dL (H)).  Sepsis Labs: @LABRCNTIP (procalcitonin:4,lacticidven:4) )No results found for this or any previous visit (from  the past 240 hour(s)).   Radiological Exams on Admission: Dg Chest 2 View  Result Date: 02/18/2016 CLINICAL DATA:  Hallucinations, altered mental status EXAM: CHEST  2 VIEW COMPARISON:  03/26/2015 FINDINGS: Cardiomediastinal silhouette is stable. Old left lower rib fractures. No infiltrate or pleural effusion. No pulmonary edema. Thoracic and lumbar spine osteopenia. Mild degenerative changes lower thoracic and lumbar spine. IMPRESSION: No active cardiopulmonary disease. Electronically Signed   By: Natasha MeadLiviu  Pop M.D.   On: 02/18/2016 13:03   Ct Head Wo Contrast  Result Date: 02/18/2016 CLINICAL DATA:  Auditory hallucinations. EXAM: CT HEAD WITHOUT CONTRAST TECHNIQUE: Contiguous axial images were obtained from the base of the skull through the vertex without intravenous contrast. COMPARISON:  09/29/2014. FINDINGS: Brain: Diffusely enlarged ventricles and subarachnoid spaces. No intracranial hemorrhage, mass lesion or CT evidence of acute infarction. Vascular: No hyperdense vessel or unexpected calcification. Skull: Normal. Negative for fracture or focal lesion. Sinuses/Orbits: Left sphenoid sinus mucosal thickening and right maxillary sinus retention cysts. Unremarkable orbits. Other: None. IMPRESSION: 1. No acute abnormality. 2. Stable mild diffuse cerebral and cerebellar atrophy. 3. Chronic left sphenoid sinusitis with improvement. Electronically Signed   By: Beckie SaltsSteven  Reid M.D.   On: 02/18/2016 14:01    EKG: Independently reviewed. Sinus, no signs of ACS there are some nonspecific changes from previous.  Assessment/Plan Active Problems:   EtOH dependence (HCC)   Alcoholic liver disease (HCC)   Hepatic encephalopathy (HCC)    Visual hallucinations   Hypotension   Acute renal failure (ARF) (HCC)   Chronic pain   Hepatic encephalopathy: Visual hallucinations and confusion. Ammonia 76. History of alcoholic cirrhosis. Dehydration may be contributing given acute renal failure and very low fluid intake recently. Doubt other etiology such as medication induced, infectious etiology, other intracranial process. UDS negative (despite having Dilaudid on home Rx list). CT head without evidence of acute abnormality. - SDU - Lactulose scheduled - f/u BCX and UCX.  - treatment of cirrhosis and acute renal failure as below.   Hypotension: Likely secondary to profound dehydration in the setting of continued diuretic use. Given patient's altered mental status there is concern that she may have accidentally taken more than her scheduled dose of propranolol/lasix/spironolactone. EKG unremarkable. 2 L normal saline administered in ED. Pt states BP has been in 70s for several weeks.review of chart shows typical SBP 120s.  - Hold Aldactone, Lasix, propranolol - Additional 1 L normal saline bolus - NS 100 ml/hr   H/o ETOH abuse: unable to provide h/o current ETOH use.  - CIWA  Renal failure. Creatinine 2.5 and BUN 34. Baseline 1.08 and 15 respectively. Likely secondary to profound dehydration given patient's recent altered mental status and endorsement of little to no oral intake for several days and little to no urinary output for days in the setting of diuretic use. - IVF - BMP - Further workup warranted if not improving.  Alcoholic cirrhosis: MELD score 18. No evidence of SBP at this time. Previously on [facility] transplant list. - Holding propranolol, Aldactone, Lasix due to profound hypotension. Resume unable.  Chronic pain: supposedly on Dilaudid 2mg  at home though UDS negative for opioids - Oxycodone 5mg  Q4 prn  - Continue neurontin in am  Gout: no current flare.  - hold colchicine and allopurinol until renal function  improves  Depression: - continue remeron   DVT prophylaxis: hep  Code Status: full  Family Communication: none  Disposition Plan: pending improvement in mental status  Consults called: none  Admission status: inpt  Diamond Jentz J MD [facility]  If 7PM-7AM, please contact night-coverage [email or web address] Password [facility]  02/18/2016, 5:56 PM

## 2016-02-18 NOTE — ED Notes (Signed)
Per Dr. Silverio LayYao pt okay to go to xray.

## 2016-02-18 NOTE — ED Notes (Signed)
Pt called out requesting a phone in order to call boyfriend to let him know that she will be admitted. RN gave pt phone and pt will return it when finished.

## 2016-02-18 NOTE — ED Provider Notes (Signed)
[facility]-EMERGENCY DEPT Provider Note   CSN: [id number] Arrival date & time: 02/18/16  1140     History   Chief Complaint Chief Complaint  Patient presents with  . Altered Mental Status    HPI Kathryn [name] is a 65 y.o. female hx of alcohol cirrhosis, GERD, ascites here with visual hallucinations. For the last 2 days, she's been having some visual hallucinations. On Halloween night, she thought that she saw some people under the house that are crawling. Since yesterday, she thought there were some people outside on her driveway and the police was cleared to toe a couple trucks. She stayed up all night looking for those people. She told her husband today who told her that she is having hallucinations. The people do talk to her but they did not tell her to kill herself or other people. She states that she has not been depressed and does not have any history of psychiatric problems. On her chart review, Asian doesn't have any psychiatric diseases but does have a history of alcohol liver cirrhosis. At one point, [facility]. Has been having poor appetite recently. Denies abdominal pain or vomiting or cough or fevers.   The history is provided by the patient.    Past Medical History:  Diagnosis Date  . Alcoholic liver disease (HCC)   . Arthritis    "all over"  . Ascites 09/22/11   "first time for , now resolved  . Dysrhythmia    "irregular"  . Fracture, humerus 07/2011   right  . GERD (gastroesophageal reflux disease)   . High cholesterol   . History of blood transfusion 07/2011   "when I had elbow surgery"  . Hypertension    not current  . Myocardial infarction    NSTEMI 12/2011 in setting of septic shock  . Osteomyelitis (HCC)    left foot  . Osteoporosis   . Pancreatitis several years ago  . Peripheral vascular disease (HCC)   . Staph infection    left foot    Patient Active Problem List   Diagnosis Date Noted  . Abdominal pain 03/26/2015  .  Acute on chronic pancreatitis (HCC) 03/26/2015  . Hypokalemia 03/26/2015  . Palpitation 03/26/2015  . Hypomagnesemia 03/26/2015  . Insomnia 03/17/2012  . Diarrhea 02/23/2012  . Hypoalbuminemia 02/21/2012  . Severe protein-calorie malnutrition (HCC) 10/21/2011  . Ascites 09/22/2011  . Alcoholic liver disease (HCC) 09/22/2011  . EtOH dependence (HCC) 07/27/2011  . Essential hypertension 09/16/2009  . SCOLIOSIS 08/17/2009  . UNSPECIFIED MONONEURITIS OF UPPER LIMB 06/12/2008  . HYPERLIPIDEMIA 02/07/2007  . GERD 02/07/2007  . OSTEOARTHRITIS 02/07/2007  . Osteoporosis 02/07/2007    Past Surgical History:  Procedure Laterality Date  . AMPUTATION     two toes on left foot  . ARTHROSCOPIC REPAIR ACL     right knee  . BUNIONECTOMY     both feet  . ESOPHAGOGASTRODUODENOSCOPY  09/23/2011   Procedure: ESOPHAGOGASTRODUODENOSCOPY (EGD);  Surgeon: Shirley FriarVincent C. Schooler, MD;  [facility];  Service: Endoscopy;  Laterality: N/A;  . ESOPHAGOGASTRODUODENOSCOPY (EGD) WITH PROPOFOL N/A 08/22/2012   Procedure: ESOPHAGOGASTRODUODENOSCOPY (EGD) WITH PROPOFOL;  Surgeon: Willis ModenaWilliam Outlaw, MD;  Location: [facility] ENDOSCOPY;  Service: Endoscopy;  Laterality: N/A;  . EUS N/A 03/25/2015   Procedure: UPPER ENDOSCOPIC ULTRASOUND (EUS) RADIAL;  Surgeon: Willis ModenaWilliam Outlaw, MD;  Location: [facility] ENDOSCOPY;  Service: Endoscopy;  Laterality: N/A;  . FINE NEEDLE ASPIRATION N/A 03/25/2015   Procedure: FINE NEEDLE ASPIRATION (FNA) RADIAL;  Surgeon: Willis Modena, MD;  Location: [facility] ENDOSCOPY;  Service: Endoscopy;  Laterality: N/A;  . FRACTURE SURGERY  2013   Right elbow  . JOINT REPLACEMENT     right hip   . LEG SURGERY Left    Correction of unequal leg length  . ORIF HUMERUS FRACTURE  07/26/2011   Procedure: OPEN REDUCTION INTERNAL FIXATION (ORIF) DISTAL HUMERUS FRACTURE;  Surgeon: Budd Palmer, MD;  Location: [facility] OR;  Service: Orthopedics;  Laterality: Right;  . ORIF WRIST FRACTURE Left 12/26/2014   Procedure: OPEN REDUCTION  INTERNAL FIXATION (ORIF) WRIST FRACTURE;  Surgeon: Cammy Copa, MD;  Location: [facility] OR;  Service: Orthopedics;  Laterality: Left;  . PARACENTESIS  09/22/11   2.2L  . TOTAL HIP ARTHROPLASTY  ~ 2003   right  . TRANSMETATARSAL AMPUTATION  02/16/2012   Procedure: TRANSMETATARSAL AMPUTATION;  Surgeon: Nadara Mustard, MD;  Location: [facility] OR;  Service: Orthopedics;  Laterality: Left;  . TUBAL LIGATION  1980    OB History    No data available       Home Medications    Prior to Admission medications   Medication Sig Start Date End Date Taking? Authorizing Provider  alendronate (FOSAMAX) 70 MG tablet Take 70 mg by mouth once a week. Saturday 03/04/15   Historical Provider, MD  allopurinol (ZYLOPRIM) 100 MG tablet Take 100 mg by mouth 2 (two) times daily.    Historical Provider, MD  Calcium Carb-Cholecalciferol (CALCIUM 600 + D PO) Take 1 tablet by mouth 2 (two) times daily.    Historical Provider, MD  cephALEXin (KEFLEX) 500 MG capsule Take 1 capsule (500 mg total) by mouth 2 (two) times daily. 10/27/15   Tomasita Crumble, MD  colchicine 0.6 MG tablet Take 0.6 mg by mouth 2 (two) times daily as needed (gout).    Historical Provider, MD  folic acid (FOLVITE) 1 MG tablet Take 1 tablet (1 mg total) by mouth daily. Patient taking differently: Take 400 mcg by mouth daily.  04/20/12   Brent Bulla, MD  furosemide (LASIX) 40 MG tablet Take 1 tablet (40 mg total) by mouth daily. 12/26/14   Cammy Copa, MD  gabapentin (NEURONTIN) 100 MG capsule Take 200 mg by mouth 3 (three) times daily as needed. Nerve pain 02/10/15   Historical Provider, MD  HYDROmorphone (DILAUDID) 2 MG tablet Take 1 tablet (2 mg total) by mouth 2 (two) times daily as needed for severe pain. 10/27/15   Tomasita Crumble, MD  mirtazapine (REMERON) 15 MG tablet Take 1 tablet (15 mg total) by mouth at bedtime. 03/26/12   Brent Bulla, MD  propranolol (INDERAL) 10 MG tablet Take 1 tablet (10 mg total) by mouth 2 (two) times daily. 03/26/12   Brent Bulla, MD  spironolactone (ALDACTONE) 50 MG tablet Take 50 mg by mouth daily.    Historical Provider, MD  thiamine (VITAMIN B-1) 100 MG tablet Take 1 tablet (100 mg total) by mouth daily. Patient taking differently: Take 250 mg by mouth daily.  04/20/12   Brent Bulla, MD    Family History History reviewed. No pertinent family history.  Social History Social History  Substance Use Topics  . Smoking status: Never Smoker  . Smokeless tobacco: Never Used  . Alcohol use No     Comment: "last drink was day before OR in 07/2011"     Allergies   Codeine and Tramadol   Review of Systems Review of Systems  Psychiatric/Behavioral: Positive for hallucinations.  All other systems reviewed and are negative.    Physical Exam Updated Vital Signs BP (!) 72/55 (BP Location: Right Arm)   Pulse 74   Temp 97.4 F (36.3 C) (Oral)   Resp 18   Ht 4\' 11"  (1.499 m)   Wt 102 lb (46.3 kg)   SpO2 99%   BMI 20.60 kg/m   Physical Exam  Constitutional:  Chronically ill   HENT:  Head: Normocephalic.  MM slightly dry   Eyes: EOM are normal. Pupils are equal, round, and reactive to light.  Neck: Normal range of motion. Neck supple.  Cardiovascular: Normal rate, regular rhythm and normal heart sounds.   Pulmonary/Chest: Effort normal and breath sounds normal. No respiratory distress. She has no wheezes. She has no rales.  Abdominal: Soft. Bowel sounds are normal.  + fluid wave. Nontender   Musculoskeletal: Normal range of motion.  Neurological: She is alert.  A &o x 3. CN 2-12 intact. Nl strength throughout. No obvious asterixis   Skin: Skin is warm.  Psychiatric:  Not depressed. Occasional visual hallucinations but redirectible   Nursing note and vitals reviewed.    ED Treatments / Results  Labs (all labs ordered are listed, but only abnormal results are displayed) Labs Reviewed  COMPREHENSIVE METABOLIC PANEL - Abnormal; Notable for the following:       Result Value   Chloride 97  (*)    CO2 21 (*)    BUN 34 (*)    Creatinine, Ser 2.51 (*)    Albumin 3.2 (*)    ALT 12 (*)    Alkaline Phosphatase 151 (*)    GFR calc non Af Amer 19 (*)    GFR calc Af Amer 22 (*)    Anion gap 18 (*)    All other components within normal limits  CBC WITH DIFFERENTIAL/PLATELET - Abnormal; Notable for the following:    RBC 3.37 (*)    Hemoglobin 11.3 (*)    HCT 35.2 (*)    MCV 104.5 (*)    All other components within normal limits  URINALYSIS, ROUTINE W REFLEX MICROSCOPIC (NOT AT [facility]) - Abnormal; Notable for the following:    APPearance CLOUDY (*)    Leukocytes, UA SMALL (*)    All other components within normal limits  AMMONIA - Abnormal; Notable for the following:    Ammonia 76 (*)    All other components within normal limits  URINE MICROSCOPIC-ADD ON - Abnormal; Notable for the following:    Squamous Epithelial / LPF 6-30 (*)    Bacteria, UA RARE (*)    All other components within normal limits  ETHANOL  RAPID URINE DRUG SCREEN, HOSP PERFORMED  I-STAT CG4 LACTIC ACID, ED  I-STAT TROPOININ, ED    EKG  EKG Interpretation  Date/Time:  Thursday February 18 2016 12:11:27 EDT Ventricular Rate:  80 PR Interval:    QRS Duration: 90 QT Interval:  400 QTC Calculation: 462 R Axis:   101 Text Interpretation:  Sinus rhythm Probable right ventricular hypertrophy Nonspecific T abnormalities, lateral leads previous EKG had poor baseline that is improved now  Confirmed by YAO  MD, DAVID ([id number]) on 02/18/2016 1:20:19 PM       Radiology Dg Chest 2 View  Result Date: 02/18/2016 CLINICAL DATA:  Hallucinations, altered mental status EXAM: CHEST  2 VIEW COMPARISON:  03/26/2015 FINDINGS: Cardiomediastinal silhouette is stable. Old left lower rib fractures. No infiltrate or pleural effusion. No pulmonary edema. Thoracic and lumbar spine osteopenia. Mild degenerative changes  lower thoracic and lumbar spine. IMPRESSION: No active cardiopulmonary disease. Electronically Signed   By: Natasha Mead M.D.   On: 02/18/2016 13:03   Ct Head Wo Contrast  Result Date: 02/18/2016 CLINICAL DATA:  Auditory hallucinations. EXAM: CT HEAD WITHOUT CONTRAST TECHNIQUE: Contiguous axial images were obtained from the base of the skull through the vertex without intravenous contrast. COMPARISON:  09/29/2014. FINDINGS: Brain: Diffusely enlarged ventricles and subarachnoid spaces. No intracranial hemorrhage, mass lesion or CT evidence of acute infarction. Vascular: No hyperdense vessel or unexpected calcification. Skull: Normal. Negative for fracture or focal lesion. Sinuses/Orbits: Left sphenoid sinus mucosal thickening and right maxillary sinus retention cysts. Unremarkable orbits. Other: None. IMPRESSION: 1. No acute abnormality. 2. Stable mild diffuse cerebral and cerebellar atrophy. 3. Chronic left sphenoid sinusitis with improvement. Electronically Signed   By: Beckie Salts M.D.   On: 02/18/2016 14:01    Procedures Procedures (including critical care time)  CRITICAL CARE Performed by: Richardean Canal   Total critical care time: 30 minutes  Critical care time was exclusive of separately billable procedures and treating other patients.  Critical care was necessary to treat or prevent imminent or life-threatening deterioration.  Critical care was time spent personally by me on the following activities: development of treatment plan with patient and/or surrogate as well as nursing, discussions with consultants, evaluation of patient's response to treatment, examination of patient, obtaining history from patient or surrogate, ordering and performing treatments and interventions, ordering and review of laboratory studies, ordering and review of radiographic studies, pulse oximetry and re-evaluation of patient's condition.   Medications Ordered in ED Medications  sodium chloride 0.9 % bolus 1,000 mL (1,000 mLs Intravenous New Bag/Given 02/18/16 1236)  sodium chloride 0.9 % bolus 1,000 mL (1,000 mLs  Intravenous New Bag/Given 02/18/16 1335)  lactulose (CHRONULAC) 10 GM/15ML solution 30 g (30 g Oral Given 02/18/16 1341)     Initial Impression / Assessment and Plan / ED Course  I have reviewed the triage vital signs and the nursing notes.  Pertinent labs & imaging results that were available during my care of the patient were reviewed by me and considered in my medical decision making (see chart for details).  Clinical Course    JETAUN COLBATH is a 65 y.o. female here with visual hallucinations, hypotension. She states that her BP usually runs low but previous ED visits her BP is in the 120-130s. She is on multiple BP meds at home but didn't take more than usual. Consider infection vs hepatic encephalopathy. Abdomen nontender, I doubt SBP. Will get labs, lactate, UA, CXR, CT head.   2:12 PM Cr 2.5, acute renal failure. UA showed no UTI. CXR clear. Lactate nl. Ammonia level 76. Given lactulose. Likely has hepatic encephalopathy and hepatorenal syndrome with the hypotension. Abdomen remains nontender. BP stable around 80s after 2 L NS bolus. Mentating well right now. Will admit to stepdown   Final Clinical Impressions(s) / ED Diagnoses   Final diagnoses:  None    New Prescriptions New Prescriptions   No medications on file     Charlynne Pander, MD 02/18/16 1430

## 2016-02-19 DIAGNOSIS — G8929 Other chronic pain: Secondary | ICD-10-CM

## 2016-02-19 LAB — COMPREHENSIVE METABOLIC PANEL
ALBUMIN: 2.2 g/dL — AB (ref 3.5–5.0)
ALK PHOS: 117 U/L (ref 38–126)
ALT: 9 U/L — ABNORMAL LOW (ref 14–54)
ANION GAP: 8 (ref 5–15)
AST: 33 U/L (ref 15–41)
BILIRUBIN TOTAL: 0.5 mg/dL (ref 0.3–1.2)
BUN: 24 mg/dL — AB (ref 6–20)
CALCIUM: 7 mg/dL — AB (ref 8.9–10.3)
CO2: 18 mmol/L — ABNORMAL LOW (ref 22–32)
Chloride: 112 mmol/L — ABNORMAL HIGH (ref 101–111)
Creatinine, Ser: 1.7 mg/dL — ABNORMAL HIGH (ref 0.44–1.00)
GFR calc Af Amer: 36 mL/min — ABNORMAL LOW (ref >60)
GFR, EST NON AFRICAN AMERICAN: 31 mL/min — AB (ref >60)
GLUCOSE: 80 mg/dL (ref 65–99)
Potassium: 3.4 mmol/L — ABNORMAL LOW (ref 3.5–5.1)
Sodium: 138 mmol/L (ref 135–145)
TOTAL PROTEIN: 5.3 g/dL — AB (ref 6.5–8.1)

## 2016-02-19 LAB — CBC
HEMATOCRIT: 28 % — AB (ref 36.0–46.0)
HEMOGLOBIN: 9 g/dL — AB (ref 12.0–15.0)
MCH: 33.7 pg (ref 26.0–34.0)
MCHC: 32.1 g/dL (ref 30.0–36.0)
MCV: 104.9 fL — AB (ref 78.0–100.0)
Platelets: 133 10*3/uL — ABNORMAL LOW (ref 150–400)
RBC: 2.67 MIL/uL — ABNORMAL LOW (ref 3.87–5.11)
RDW: 14.1 % (ref 11.5–15.5)
WBC: 4 10*3/uL (ref 4.0–10.5)

## 2016-02-19 LAB — AMMONIA: AMMONIA: 19 umol/L (ref 9–35)

## 2016-02-19 LAB — MRSA PCR SCREENING: MRSA by PCR: NEGATIVE

## 2016-02-19 MED ORDER — MAGNESIUM SULFATE 2 GM/50ML IV SOLN
2.0000 g | Freq: Once | INTRAVENOUS | Status: AC
  Administered 2016-02-19: 2 g via INTRAVENOUS
  Filled 2016-02-19: qty 50

## 2016-02-19 MED ORDER — ENSURE ENLIVE PO LIQD
237.0000 mL | Freq: Two times a day (BID) | ORAL | Status: DC
  Administered 2016-02-19 – 2016-02-25 (×10): 237 mL via ORAL

## 2016-02-19 MED ORDER — SODIUM CHLORIDE 0.9 % IV BOLUS (SEPSIS)
500.0000 mL | Freq: Once | INTRAVENOUS | Status: AC
  Administered 2016-02-19: 500 mL via INTRAVENOUS

## 2016-02-19 NOTE — Progress Notes (Signed)
Initial Nutrition Assessment  INTERVENTION:  Ensure Enlive po BID, each supplement provides 350 kcal and 20 grams of protein Multivitamin with minerals daily   NUTRITION DIAGNOSIS:   Inadequate oral intake related to lethargy/confusion, poor appetite as evidenced by per patient/family report, energy intake < or equal to 50% for > or equal to 5 days.   GOAL:   Patient will meet greater than or equal to 90% of their needs   MONITOR:   PO intake, Supplement acceptance, Labs, I & O's, Weight trends  REASON FOR ASSESSMENT:   Malnutrition Screening Tool    ASSESSMENT:   65 y.o. female with medical history significant of alcoholic cirrhosis, GERD, HLD, HTN, MI, osteomyelitis of the left foot status post amputation, pancreatitis, peripheral vascular disease presenting w/ visual hallucinations and general is confusion  Pt alert and oriented at time of visit. She states that she was not eating for one week PTA, but is eating today and reports having a good appetite now. She reports losing a little weight, stating that she usually weighs 105 lbs. She was eating lunch slowly at time of visit. She reports that normally she only eats a few bites of food at one time, but eats constantly throughout the day. Pt is unsure how well she will eat while in the hospital. RD discussed the importance of nutrition and intake of a variety of foods. RD offered to provide snacks and nutritional supplements until PO intake at meals improves. Pt states that she likes Ensure.   Labs: low calcium, low albumin, low GFR, low hemoglobin, low magnesium  Diet Order:  Diet regular Room service appropriate? Yes; Fluid consistency: Thin  Skin:  Reviewed, no issues  Last BM:  11/3  Height:   Ht Readings from Last 1 Encounters:  02/18/16 4\' 11"  (1.499 m)    Weight:   Wt Readings from Last 1 Encounters:  02/18/16 102 lb (46.3 kg)    Ideal Body Weight:  44.5 kg  BMI:  Body mass index is 20.6  kg/m.  Estimated Nutritional Needs:   Kcal:  1200-1400  Protein:  45-55 grams  Fluid:  1.2-1.4 L/day  EDUCATION NEEDS:   No education needs identified at this time  Dorothea Ogleeanne Kimberli Winne RD, CSP, LDN Inpatient Clinical Dietitian Pager: [phone] After Hours Pager: [phone]

## 2016-02-19 NOTE — Progress Notes (Signed)
   02/19/16 0200  Vitals  BP (!) 72/58  MAP (mmHg) (!) 64  Pulse Rate 78  ECG Heart Rate 79  Resp 16  Oxygen Therapy  SpO2 100 %  Notified MD of pt current status, she is asymptomatic, she was given boluses in the ED, notes made that ED doctor was aware.

## 2016-02-19 NOTE — Progress Notes (Signed)
PROGRESS NOTE    Kathryn Livingnn B Fabro  ZOX:[id number] DOB: 18-Apr-1951 DOA: 02/18/2016 PCP: [name], MD   Chief Complaint  Patient presents with  . Altered Mental Status    Brief Narrative:  HPI on 02/18/2016 by Dr. Shelly Flattenavid Merrell Kathryn [name] is a 65 y.o. female with medical history significant of alcoholic cirrhosis, GERD, HLD, HTN, MI, osteomyelitis of the left foot status post amputation, pancreatitis, peripheral vascular disease presenting w/ visual hallucinations and general is confusion. Level V caveat applies as patient is unable to provide history. Family not present at time of admission. History provided by patient, EDP, and nursing reports. Patient has approximately 1-2 day history of visual hallucinations. She reports seeing people crawling under her house, place and to trucks towing multiple cars out of her driveway, staying up all night watching people in her driveway. Patient does endorse that these people to talk to her. No history of psychiatric problems. Denies any focal complaints such as fevers, abdominal swelling, abdominal pain, dysuria, frequency, back pain, rash, neck stiffness, headache, cough, chest pain. Patient without recollection of these events. Patient does recall several occurrences over the past week of certain activities that seemed bizarre such as filling up a cup of water and placing on the counter, turning around to do something else and when she turns back the water is gone. Patient isof these events. Patient has no recollection of reports of hallucinations. Patient states that she administers her own medications. Assessment & Plan   Hepatic encephalopathy -Mental status as appear to be improving this morning -Ammonia level 76 upon admission. He resented with visual hallucinations and confusion. -Patient does have history of alcoholic cirrhosis -CT head unremarkable -Chest x-ray unremarkable -Continue lactulose -Patient does follow-up with Dr. Dulce Sellarutlaw,  gastroenterologist. -Patient has not been taking lactulose at home. Gastroenterology consulted and appreciated. -Pending ammonia level  Hypotension -Likely secondary to dehydration and continued diuretic use -BP better today, continues to be low normal -Home medications, Aldactone, Lasix, propranolol held -Continue IV fluid  Acute on chronic kidney disease, stage III -Likely secondary to dehydration -Creatinine 2.51 upon admission, currently 1.7 -Baseline creatinine approximately 1  Alcoholic cirrhosis -No evidence of SBP -Propranolol, Aldactone, Lasix currently held due to hypotension  History of alcohol abuse -Patient denies any recent alcohol use -Continue CIWA protocol  Chronic pain -Patient on Dilaudid 2 mg at home although her UDS is negative for opiates -Continue pain control Neurontin as needed  Gout -Current stable -Colchicine and allopurinol held, will restart  Depression -Continue Remeron  DVT Prophylaxis  heparin  Code Status: Full  Family Communication: None bedside  Disposition Plan: Admitted. Continue to monitor and stepdown. When stable, will discharge to home  Consultants Gastroenterology  Procedures  None  Antibiotics   Anti-infectives    None      Subjective:   Kathryn [name] seen and examined today. Patient states she's feeling much better today. Denies any chest pain, shortness of breath, abdominal pain, nausea or vomiting, constipation. Does states she's been going to bathroom "a lot." This continued to state she has nightmares.   Objective:   Vitals:   02/19/16 0200 02/19/16 0300 02/19/16 0500 02/19/16 0700  BP: (!) 72/58 (!) 75/57 (!) 83/63 (!) 84/56  Pulse: 78 81 80 79  Resp: 16 (!) 23 20 (!) 24  Temp:  97.9 F (36.6 C)  98.3 F (36.8 C)  TempSrc:  Oral  Oral  SpO2: 100% 100% 100% 99%  Weight:      Height:  Intake/Output Summary (Last 24 hours) at 02/19/16 1023 Last data filed at 02/18/16 1926  Gross per 24 hour    Intake             3000 ml  Output                0 ml  Net             3000 ml   Filed Weights   02/18/16 1150  Weight: 46.3 kg (102 lb)    Exam  General: Well developed, well nourished, NAD, appears stated age  HEENT: NCAT, mucous membranes moist.   Cardiovascular: S1 S2 auscultated, 2/6SEM, Regular rate and rhythm.  Respiratory: Clear to auscultation bilaterally with equal chest rise  Abdomen: Soft, nontender, nondistended, + bowel sounds  Extremities: warm dry without cyanosis clubbing or edema  Neuro: AAOx3, Nonfocal  Psych: Normal affect and demeanor with intact judgement and insight   Data Reviewed: I have personally reviewed following labs and imaging studies  CBC:  Recent Labs Lab 02/18/16 1158 02/19/16 0531  WBC 7.7 4.0  NEUTROABS 5.4  --   HGB 11.3* 9.0*  HCT 35.2* 28.0*  MCV 104.5* 104.9*  PLT 209 133*   Basic Metabolic Panel:  Recent Labs Lab 02/18/16 1158 02/18/16 2059 02/19/16 0531  NA 136  --  138  K 4.3  --  3.4*  CL 97*  --  112*  CO2 21*  --  18*  GLUCOSE 83  --  80  BUN 34*  --  24*  CREATININE 2.51*  --  1.70*  CALCIUM 8.9  --  7.0*  MG  --  1.0*  --   PHOS  --  3.5  --    GFR: Estimated Creatinine Clearance: 23.1 mL/min (by C-G formula based on SCr of 1.7 mg/dL (H)). Liver Function Tests:  Recent Labs Lab 02/18/16 1158 02/19/16 0531  AST 36 33  ALT 12* 9*  ALKPHOS 151* 117  BILITOT 1.2 0.5  PROT 7.3 5.3*  ALBUMIN 3.2* 2.2*   No results for input(s): LIPASE, AMYLASE in the last 168 hours.  Recent Labs Lab 02/18/16 1213 02/19/16 0829  AMMONIA 76* 19   Coagulation Profile: No results for input(s): INR, PROTIME in the last 168 hours. Cardiac Enzymes: No results for input(s): CKTOTAL, CKMB, CKMBINDEX, TROPONINI in the last 168 hours. BNP (last 3 results) No results for input(s): PROBNP in the last 8760 hours. HbA1C: No results for input(s): HGBA1C in the last 72 hours. CBG: No results for input(s): GLUCAP  in the last 168 hours. Lipid Profile: No results for input(s): CHOL, HDL, LDLCALC, TRIG, CHOLHDL, LDLDIRECT in the last 72 hours. Thyroid Function Tests: No results for input(s): TSH, T4TOTAL, FREET4, T3FREE, THYROIDAB in the last 72 hours. Anemia Panel: No results for input(s): VITAMINB12, FOLATE, FERRITIN, TIBC, IRON, RETICCTPCT in the last 72 hours. Urine analysis:    Component Value Date/Time   COLORURINE YELLOW 02/18/2016 1224   APPEARANCEUR CLOUDY (A) 02/18/2016 1224   LABSPEC 1.008 02/18/2016 1224   PHURINE 5.5 02/18/2016 1224   GLUCOSEU NEGATIVE 02/18/2016 1224   GLUCOSEU NEG mg/dL 16/01/9603 5409   HGBUR NEGATIVE 02/18/2016 1224   HGBUR negative 03/17/2008 1240   BILIRUBINUR NEGATIVE 02/18/2016 1224   KETONESUR NEGATIVE 02/18/2016 1224   PROTEINUR NEGATIVE 02/18/2016 1224   UROBILINOGEN 0.2 12/30/2013 1251   NITRITE NEGATIVE 02/18/2016 1224   LEUKOCYTESUR SMALL (A) 02/18/2016 1224   Sepsis Labs: @LABRCNTIP (procalcitonin:4,lacticidven:4)  ) Recent Results (from the past  240 hour(s))  MRSA PCR Screening     Status: None   Collection Time: 02/18/16  8:31 PM  Result Value Ref Range Status   MRSA by PCR NEGATIVE NEGATIVE Final    Comment:        The GeneXpert MRSA Assay (FDA approved for NASAL specimens only), is one component of a comprehensive MRSA colonization surveillance program. It is not intended to diagnose MRSA infection nor to guide or monitor treatment for MRSA infections.       Radiology Studies: Dg Chest 2 View  Result Date: 02/18/2016 CLINICAL DATA:  Hallucinations, altered mental status EXAM: CHEST  2 VIEW COMPARISON:  03/26/2015 FINDINGS: Cardiomediastinal silhouette is stable. Old left lower rib fractures. No infiltrate or pleural effusion. No pulmonary edema. Thoracic and lumbar spine osteopenia. Mild degenerative changes lower thoracic and lumbar spine. IMPRESSION: No active cardiopulmonary disease. Electronically Signed   By: Natasha MeadLiviu  Pop M.D.    On: 02/18/2016 13:03   Ct Head Wo Contrast  Result Date: 02/18/2016 CLINICAL DATA:  Auditory hallucinations. EXAM: CT HEAD WITHOUT CONTRAST TECHNIQUE: Contiguous axial images were obtained from the base of the skull through the vertex without intravenous contrast. COMPARISON:  09/29/2014. FINDINGS: Brain: Diffusely enlarged ventricles and subarachnoid spaces. No intracranial hemorrhage, mass lesion or CT evidence of acute infarction. Vascular: No hyperdense vessel or unexpected calcification. Skull: Normal. Negative for fracture or focal lesion. Sinuses/Orbits: Left sphenoid sinus mucosal thickening and right maxillary sinus retention cysts. Unremarkable orbits. Other: None. IMPRESSION: 1. No acute abnormality. 2. Stable mild diffuse cerebral and cerebellar atrophy. 3. Chronic left sphenoid sinusitis with improvement. Electronically Signed   By: Beckie SaltsSteven  Reid M.D.   On: 02/18/2016 14:01     Scheduled Meds: . folic acid  1 mg Oral Daily  . heparin  5,000 Units Subcutaneous Q8H  . lactulose  30 g Oral TID  . mirtazapine  15 mg Oral QHS  . multivitamin with minerals  1 tablet Oral Daily  . sodium chloride flush  3 mL Intravenous Q12H  . thiamine  100 mg Oral Daily   Continuous Infusions: . sodium chloride 100 mL/hr at 02/18/16 2128     LOS: 1 day   Time Spent in minutes   30 minutes  Essam Lowdermilk D.O. on 02/19/2016 at 10:23 AM  Between 7am to 7pm - Pager - [phone]  After 7pm go to [email or web address] - password [email or web address]  And look for the night coverage person covering for me after hours  [facility] Office  [phone]

## 2016-02-20 LAB — CBC
HEMATOCRIT: 28.3 % — AB (ref 36.0–46.0)
HEMOGLOBIN: 8.8 g/dL — AB (ref 12.0–15.0)
MCH: 33.7 pg (ref 26.0–34.0)
MCHC: 31.1 g/dL (ref 30.0–36.0)
MCV: 108.4 fL — ABNORMAL HIGH (ref 78.0–100.0)
Platelets: 146 10*3/uL — ABNORMAL LOW (ref 150–400)
RBC: 2.61 MIL/uL — AB (ref 3.87–5.11)
RDW: 14.1 % (ref 11.5–15.5)
WBC: 3.2 10*3/uL — ABNORMAL LOW (ref 4.0–10.5)

## 2016-02-20 LAB — COMPREHENSIVE METABOLIC PANEL
ALBUMIN: 2.2 g/dL — AB (ref 3.5–5.0)
ALK PHOS: 118 U/L (ref 38–126)
ALT: 10 U/L — AB (ref 14–54)
ANION GAP: 7 (ref 5–15)
AST: 30 U/L (ref 15–41)
BILIRUBIN TOTAL: 0.7 mg/dL (ref 0.3–1.2)
BUN: 12 mg/dL (ref 6–20)
CALCIUM: 8.1 mg/dL — AB (ref 8.9–10.3)
CO2: 18 mmol/L — AB (ref 22–32)
CREATININE: 1.09 mg/dL — AB (ref 0.44–1.00)
Chloride: 116 mmol/L — ABNORMAL HIGH (ref 101–111)
GFR calc Af Amer: >60 mL/min (ref >60)
GFR calc non Af Amer: 53 mL/min — ABNORMAL LOW (ref >60)
GLUCOSE: 112 mg/dL — AB (ref 65–99)
Potassium: 4 mmol/L (ref 3.5–5.1)
SODIUM: 141 mmol/L (ref 135–145)
TOTAL PROTEIN: 5.5 g/dL — AB (ref 6.5–8.1)

## 2016-02-20 LAB — URINE CULTURE

## 2016-02-20 LAB — AMMONIA: Ammonia: 45 umol/L — ABNORMAL HIGH (ref 9–35)

## 2016-02-20 MED ORDER — HALOPERIDOL LACTATE 5 MG/ML IJ SOLN
1.0000 mg | Freq: Once | INTRAMUSCULAR | Status: AC
  Administered 2016-02-20: 1 mg via INTRAMUSCULAR

## 2016-02-20 MED ORDER — HALOPERIDOL LACTATE 5 MG/ML IJ SOLN
INTRAMUSCULAR | Status: AC
  Filled 2016-02-20: qty 1

## 2016-02-20 MED ORDER — LORAZEPAM 2 MG/ML IJ SOLN
4.0000 mg | Freq: Once | INTRAMUSCULAR | Status: AC
  Administered 2016-02-21: 4 mg via INTRAVENOUS
  Filled 2016-02-20: qty 2

## 2016-02-20 MED ORDER — LORAZEPAM 2 MG/ML IJ SOLN
INTRAMUSCULAR | Status: AC
  Administered 2016-02-20: 4 mg
  Filled 2016-02-20: qty 2

## 2016-02-20 NOTE — Progress Notes (Signed)
PROGRESS NOTE    Kathryn [name] Patti  [id number] DOB: [birth year] DOA: 02/18/2016 PCP: [name], MD   Chief Complaint  Patient presents with  . Altered Mental Status    Brief Narrative:  HPI on 02/18/2016 by Dr. Shelly Flattenavid Merrell Kathryn [name] is a 65 y.o. female with medical history significant of alcoholic cirrhosis, GERD, HLD, HTN, MI, osteomyelitis of the left foot status post amputation, pancreatitis, peripheral vascular disease presenting w/ visual hallucinations and general is confusion. Level V caveat applies as patient is unable to provide history. Family not present at time of admission. History provided by patient, EDP, and nursing reports. Patient has approximately 1-2 day history of visual hallucinations. She reports seeing people crawling under her house, place and to trucks towing multiple cars out of her driveway, staying up all night watching people in her driveway. Patient does endorse that these people to talk to her. No history of psychiatric problems. Denies any focal complaints such as fevers, abdominal swelling, abdominal pain, dysuria, frequency, back pain, rash, neck stiffness, headache, cough, chest pain. Patient without recollection of these events. Patient does recall several occurrences over the past week of certain activities that seemed bizarre such as filling up a cup of water and placing on the counter, turning around to do something else and when she turns back the water is gone. Patient isof these events. Patient has no recollection of reports of hallucinations. Patient states that she administers her own medications. Assessment & Plan   Hepatic encephalopathy -Mental status as appear to be improving this morning -Ammonia level 76 upon admission. He resented with visual hallucinations and confusion. -Patient does have history of alcoholic cirrhosis -CT head unremarkable -Chest x-ray unremarkable -Continue lactulose -Patient does follow-up with Dr. Dulce Sellarutlaw,  gastroenterologist. -Patient has not been taking lactulose at home.  -Ammonia level this morning 45 -Spoke with Dr. Dulce Sellarutlaw on 11/3.  Patient has not had symptoms of hepatic encephalopathy in the past. Agreed with lactulose at this time  Hypotension -Likely secondary to dehydration and continued diuretic use, GI losses -BP better today, continues to be low normal -Home medications, Aldactone, Lasix, propranolol held -Continue IV fluid  Acute on chronic kidney disease, stage III -Likely secondary to dehydration -Creatinine 2.51 upon admission, currently 1.09 -Baseline creatinine approximately 1  Alcoholic cirrhosis -No evidence of SBP -Propranolol, Aldactone, Lasix currently held due to hypotension  History of alcohol abuse -Patient denies any recent alcohol use -Continue CIWA protocol  Chronic pain -Patient on Dilaudid 2 mg at home although her UDS is negative for opiates -Continue pain control Neurontin as needed  Gout -Current stable -Colchicine and allopurinol held, will restart  Depression -Continue Remeron  DVT Prophylaxis  heparin  Code Status: Full  Family Communication: None bedside  Disposition Plan: Admitted. Continue to monitor and stepdown due to hypotension. When stable, will discharge to home  Consultants Gastroenterology, via phone  Procedures  None  Antibiotics   Anti-infectives    None      Subjective:   Haywood PaoAnn Loftus seen and examined today. Patient states she is confused about her medications.  Feels mildly better today.  Feels she has been having many bowel movements.  Denies any chest pain, shortness of breath, abdominal pain, nausea or vomiting, constipation.  Objective:   Vitals:   02/19/16 2300 02/20/16 0347 02/20/16 0640 02/20/16 0700  BP: 99/67 113/75 108/76 108/69  Pulse: 86 89 89 96  Resp: 18 18 19 18   Temp: 98.8 F (37.1 C) 98.5 F (36.9  C)  99 F (37.2 C)  TempSrc: Oral Oral  Oral  SpO2: 99% 98% 100% 98%  Weight:        Height:        Intake/Output Summary (Last 24 hours) at 02/20/16 1105 Last data filed at 02/20/16 0645  Gross per 24 hour  Intake          3153.33 ml  Output                0 ml  Net          3153.33 ml   Filed Weights   02/18/16 1150  Weight: 46.3 kg (102 lb)    Exam  General: Well developed, well nourished, NAD, appears stated age  HEENT: NCAT, mucous membranes moist.   Cardiovascular: S1 S2 auscultated, 2/6SEM, Regular rate and rhythm.  Respiratory: Clear to auscultation bilaterally with equal chest rise  Abdomen: Soft, nontender, nondistended, + bowel sounds  Extremities: warm dry without cyanosis clubbing or edema. Left foot transmetatarsal amputation  Neuro: AAOx3, Nonfocal  Psych: Normal affect and demeanor    Data Reviewed: I have personally reviewed following labs and imaging studies  CBC:  Recent Labs Lab 02/18/16 1158 02/19/16 0531 02/20/16 0337  WBC 7.7 4.0 3.2*  NEUTROABS 5.4  --   --   HGB 11.3* 9.0* 8.8*  HCT 35.2* 28.0* 28.3*  MCV 104.5* 104.9* 108.4*  PLT 209 133* 146*   Basic Metabolic Panel:  Recent Labs Lab 02/18/16 1158 02/18/16 2059 02/19/16 0531 02/20/16 0337  NA 136  --  138 141  K 4.3  --  3.4* 4.0  CL 97*  --  112* 116*  CO2 21*  --  18* 18*  GLUCOSE 83  --  80 112*  BUN 34*  --  24* 12  CREATININE 2.51*  --  1.70* 1.09*  CALCIUM 8.9  --  7.0* 8.1*  MG  --  1.0*  --   --   PHOS  --  3.5  --   --    GFR: Estimated Creatinine Clearance: 36 mL/min (by C-G formula based on SCr of 1.09 mg/dL (H)). Liver Function Tests:  Recent Labs Lab 02/18/16 1158 02/19/16 0531 02/20/16 0337  AST 36 33 30  ALT 12* 9* 10*  ALKPHOS 151* 117 118  BILITOT 1.2 0.5 0.7  PROT 7.3 5.3* 5.5*  ALBUMIN 3.2* 2.2* 2.2*   No results for input(s): LIPASE, AMYLASE in the last 168 hours.  Recent Labs Lab 02/18/16 1213 02/19/16 0829 02/20/16 0337  AMMONIA 76* 19 45*   Coagulation Profile: No results for input(s): INR, PROTIME in the  last 168 hours. Cardiac Enzymes: No results for input(s): CKTOTAL, CKMB, CKMBINDEX, TROPONINI in the last 168 hours. BNP (last 3 results) No results for input(s): PROBNP in the last 8760 hours. HbA1C: No results for input(s): HGBA1C in the last 72 hours. CBG: No results for input(s): GLUCAP in the last 168 hours. Lipid Profile: No results for input(s): CHOL, HDL, LDLCALC, TRIG, CHOLHDL, LDLDIRECT in the last 72 hours. Thyroid Function Tests: No results for input(s): TSH, T4TOTAL, FREET4, T3FREE, THYROIDAB in the last 72 hours. Anemia Panel: No results for input(s): VITAMINB12, FOLATE, FERRITIN, TIBC, IRON, RETICCTPCT in the last 72 hours. Urine analysis:    Component Value Date/Time   COLORURINE YELLOW 02/18/2016 1224   APPEARANCEUR CLOUDY (A) 02/18/2016 1224   LABSPEC 1.008 02/18/2016 1224   PHURINE 5.5 02/18/2016 1224   GLUCOSEU NEGATIVE 02/18/2016 1224   GLUCOSEU NEG  mg/dL 81/19/147811/30/2009 [id number]   HGBUR NEGATIVE 02/18/2016 1224   HGBUR negative 03/17/2008 1240   BILIRUBINUR NEGATIVE 02/18/2016 1224   KETONESUR NEGATIVE 02/18/2016 1224   PROTEINUR NEGATIVE 02/18/2016 1224   UROBILINOGEN 0.2 12/30/2013 1251   NITRITE NEGATIVE 02/18/2016 1224   LEUKOCYTESUR SMALL (A) 02/18/2016 1224   Sepsis Labs: @LABRCNTIP (procalcitonin:4,lacticidven:4)  ) Recent Results (from the past 240 hour(s))  Culture, Urine     Status: Abnormal   Collection Time: 02/18/16 12:24 PM  Result Value Ref Range Status   Specimen Description URINE, RANDOM  Final   Special Requests NONE  Final   Culture MULTIPLE SPECIES PRESENT, SUGGEST RECOLLECTION (A)  Final   Report Status 02/20/2016 FINAL  Final  MRSA PCR Screening     Status: None   Collection Time: 02/18/16  8:31 PM  Result Value Ref Range Status   MRSA by PCR NEGATIVE NEGATIVE Final    Comment:        The GeneXpert MRSA Assay (FDA approved for NASAL specimens only), is one component of a comprehensive MRSA colonization surveillance program. It is  not intended to diagnose MRSA infection nor to guide or monitor treatment for MRSA infections.   Culture, blood (routine x 2)     Status: None (Preliminary result)   Collection Time: 02/18/16  8:59 PM  Result Value Ref Range Status   Specimen Description BLOOD RIGHT ANTECUBITAL  Final   Special Requests BOTTLES DRAWN AEROBIC AND ANAEROBIC 10CC  Final   Culture NO GROWTH < 24 HOURS  Final   Report Status PENDING  Incomplete  Culture, blood (routine x 2)     Status: None (Preliminary result)   Collection Time: 02/18/16  9:06 PM  Result Value Ref Range Status   Specimen Description BLOOD LEFT ANTECUBITAL  Final   Special Requests BOTTLES DRAWN AEROBIC AND ANAEROBIC 5CC  Final   Culture NO GROWTH < 24 HOURS  Final   Report Status PENDING  Incomplete      Radiology Studies: Dg Chest 2 View  Result Date: 02/18/2016 CLINICAL DATA:  Hallucinations, altered mental status EXAM: CHEST  2 VIEW COMPARISON:  03/26/2015 FINDINGS: Cardiomediastinal silhouette is stable. Old left lower rib fractures. No infiltrate or pleural effusion. No pulmonary edema. Thoracic and lumbar spine osteopenia. Mild degenerative changes lower thoracic and lumbar spine. IMPRESSION: No active cardiopulmonary disease. Electronically Signed   By: Natasha MeadLiviu  Pop M.D.   On: 02/18/2016 13:03   Ct Head Wo Contrast  Result Date: 02/18/2016 CLINICAL DATA:  Auditory hallucinations. EXAM: CT HEAD WITHOUT CONTRAST TECHNIQUE: Contiguous axial images were obtained from the base of the skull through the vertex without intravenous contrast. COMPARISON:  09/29/2014. FINDINGS: Brain: Diffusely enlarged ventricles and subarachnoid spaces. No intracranial hemorrhage, mass lesion or CT evidence of acute infarction. Vascular: No hyperdense vessel or unexpected calcification. Skull: Normal. Negative for fracture or focal lesion. Sinuses/Orbits: Left sphenoid sinus mucosal thickening and right maxillary sinus retention cysts. Unremarkable orbits.  Other: None. IMPRESSION: 1. No acute abnormality. 2. Stable mild diffuse cerebral and cerebellar atrophy. 3. Chronic left sphenoid sinusitis with improvement. Electronically Signed   By: Beckie SaltsSteven  Reid M.D.   On: 02/18/2016 14:01     Scheduled Meds: . feeding supplement (ENSURE ENLIVE)  237 mL Oral BID BM  . folic acid  1 mg Oral Daily  . heparin  5,000 Units Subcutaneous Q8H  . lactulose  30 g Oral TID  . mirtazapine  15 mg Oral QHS  . multivitamin with minerals  1 tablet  Oral Daily  . sodium chloride flush  3 mL Intravenous Q12H  . thiamine  100 mg Oral Daily   Continuous Infusions: . sodium chloride 100 mL/hr at 02/20/16 0645     LOS: 2 days   Time Spent in minutes   30 minutes  Stevens Magwood D.O. on 02/20/2016 at 11:05 AM  Between 7am to 7pm - Pager - [phone]  After 7pm go to [email or web address] - password [email or web address]  And look for the night coverage person covering for me after hours  [facility] Office  [phone]

## 2016-02-20 NOTE — Progress Notes (Signed)
Patient experiencing multiple liquid stools updated MD Mikhail received order for flexiseal.  Will continue to monitor.

## 2016-02-20 NOTE — Progress Notes (Signed)
Called by RN for second set of eyes.  Patient is in 4 point restraints, confused, agitated and increased HR.   Patient recently received 4 mg IV ativan.  PAtient yelling for bedpan, assisted patient onto bedpan.  No RRT intervention.  RN to call if assistance needed

## 2016-02-21 LAB — BASIC METABOLIC PANEL
ANION GAP: 7 (ref 5–15)
BUN: 7 mg/dL (ref 6–20)
CALCIUM: 8.2 mg/dL — AB (ref 8.9–10.3)
CO2: 14 mmol/L — ABNORMAL LOW (ref 22–32)
CREATININE: 0.79 mg/dL (ref 0.44–1.00)
Chloride: 119 mmol/L — ABNORMAL HIGH (ref 101–111)
GFR calc non Af Amer: >60 mL/min (ref >60)
Glucose, Bld: 73 mg/dL (ref 65–99)
Potassium: 3.4 mmol/L — ABNORMAL LOW (ref 3.5–5.1)
SODIUM: 140 mmol/L (ref 135–145)

## 2016-02-21 LAB — CBC
HCT: 26.5 % — ABNORMAL LOW (ref 36.0–46.0)
HEMOGLOBIN: 8.3 g/dL — AB (ref 12.0–15.0)
MCH: 33.7 pg (ref 26.0–34.0)
MCHC: 31.3 g/dL (ref 30.0–36.0)
MCV: 107.7 fL — ABNORMAL HIGH (ref 78.0–100.0)
Platelets: 167 10*3/uL (ref 150–400)
RBC: 2.46 MIL/uL — ABNORMAL LOW (ref 3.87–5.11)
RDW: 13.8 % (ref 11.5–15.5)
WBC: 4.2 10*3/uL (ref 4.0–10.5)

## 2016-02-21 LAB — AMMONIA: AMMONIA: 44 umol/L — AB (ref 9–35)

## 2016-02-21 MED ORDER — LORAZEPAM 1 MG PO TABS
1.0000 mg | ORAL_TABLET | ORAL | Status: DC | PRN
  Filled 2016-02-21: qty 1

## 2016-02-21 MED ORDER — POTASSIUM CHLORIDE CRYS ER 20 MEQ PO TBCR
40.0000 meq | EXTENDED_RELEASE_TABLET | Freq: Once | ORAL | Status: DC
  Filled 2016-02-21: qty 2

## 2016-02-21 MED ORDER — LORAZEPAM 2 MG/ML IJ SOLN
1.0000 mg | INTRAMUSCULAR | Status: DC | PRN
  Administered 2016-02-21 – 2016-02-23 (×5): 1 mg via INTRAVENOUS
  Filled 2016-02-21 (×5): qty 1

## 2016-02-21 MED ORDER — POTASSIUM CHLORIDE CRYS ER 10 MEQ PO TBCR
EXTENDED_RELEASE_TABLET | ORAL | Status: AC
  Administered 2016-02-21: 40 meq
  Filled 2016-02-21: qty 4

## 2016-02-21 NOTE — Progress Notes (Signed)
PROGRESS NOTE    Kathryn [name]  ZOX:[id number] DOB: 1950-07-19 DOA: 02/18/2016 PCP: Lupe Carney, MD   Chief Complaint  Patient presents with  . Altered Mental Status    Brief Narrative:  HPI on 02/18/2016 by Dr. Shelly Flatten Kathryn [name] is a 65 y.o. female with medical history significant of alcoholic cirrhosis, GERD, HLD, HTN, MI, osteomyelitis of the left foot status post amputation, pancreatitis, peripheral vascular disease presenting w/ visual hallucinations and general is confusion. Level V caveat applies as patient is unable to provide history. Family not present at time of admission. History provided by patient, EDP, and nursing reports. Patient has approximately 1-2 day history of visual hallucinations. She reports seeing people crawling under her house, place and to trucks towing multiple cars out of her driveway, staying up all night watching people in her driveway. Patient does endorse that these people to talk to her. No history of psychiatric problems. Denies any focal complaints such as fevers, abdominal swelling, abdominal pain, dysuria, frequency, back pain, rash, neck stiffness, headache, cough, chest pain. Patient without recollection of these events. Patient does recall several occurrences over the past week of certain activities that seemed bizarre such as filling up a cup of water and placing on the counter, turning around to do something else and when she turns back the water is gone. Patient isof these events. Patient has no recollection of reports of hallucinations. Patient states that she administers her own medications. Assessment & Plan   Hepatic encephalopathy -Mental status waxes/wanes -Ammonia level 76 upon admission. He resented with visual hallucinations and confusion. -Patient does have history of alcoholic cirrhosis -CT head unremarkable -Chest x-ray unremarkable -Continue lactulose -Patient does follow-up with Dr. Dulce Sellar, gastroenterologist. -Patient has not  been taking lactulose at home.  -Ammonia level this morning 44 -Spoke with Dr. Dulce Sellar on 11/3.  Patient has not had symptoms of hepatic encephalopathy in the past. Agreed with lactulose at this time -Continue CIWA  -Currently in restraints  Hypotension -Likely secondary to dehydration and continued diuretic use, GI losses -BP better today, continues to be low normal -Home medications, Aldactone, Lasix, propranolol held -Continue IV fluid  Acute on chronic kidney disease, stage III -Likely secondary to dehydration -Creatinine 2.51 upon admission, currently 0.79 -Baseline creatinine approximately 1  Alcoholic cirrhosis -No evidence of SBP -Propranolol, Aldactone, Lasix currently held due to hypotension -Ordered US Abd (?paracentesis)  History of alcohol abuse -Patient denies any recent alcohol use -Continue CIWA protocol  Chronic pain -Patient on Dilaudid 2 mg at home although her UDS is negative for opiates -Continue pain control Neurontin as needed  Gout -Current stable -Colchicine and allopurinol held, will restart  Depression -Continue Remeron  Chronic anemia -MCV shows normocytic to macrocytic -Baseline hemoglobin between 9-10 -Currently 8.3, likely dilutional- as patient has been receiving IVF  DVT Prophylaxis  heparin  Code Status: Full  Family Communication: None bedside  Disposition Plan: Admitted. Continue to monitor and stepdown due to hypotension and encephalopathy.   Consultants Gastroenterology, via phone  Procedures  None  Antibiotics   Anti-infectives    None      Subjective:   [name] seen and examined today. Patient currently sleeping.  Woke up to tell me she was fine.  Denies pain at this time.   Objective:   Vitals:   02/20/16 1900 02/20/16 2300 02/21/16 0400 02/21/16 0700  BP: 102/68 130/82 112/81 99/69  Pulse: 81 95 95 89  Resp: (!) 21 (!) 21 (!) 25 Marland Kitchen)  22  Temp: 97.9 F (36.6 C) 98 F (36.7 C) 98.1 F (36.7 C) 97.5 F  (36.4 C)  TempSrc: Axillary Oral Oral Axillary  SpO2: 100% 100% 100% 100%  Weight:      Height:        Intake/Output Summary (Last 24 hours) at 02/21/16 1058 Last data filed at 02/21/16 0659  Gross per 24 hour  Intake             1200 ml  Output                0 ml  Net             1200 ml   Filed Weights   02/18/16 1150  Weight: 46.3 kg (102 lb)    Exam  General: Well developed, well nourished, NAD  HEENT: NCAT, mucous membranes moist.   Cardiovascular: S1 S2 auscultated, 2/6SEM, RRR  Respiratory: Clear to auscultation bilaterally with equal chest rise  Abdomen: Soft, nontender, nondistended, + bowel sounds  Extremities: warm dry without cyanosis clubbing or edema. Left foot transmetatarsal amputation  Data Reviewed: I have personally reviewed following labs and imaging studies  CBC:  Recent Labs Lab 02/18/16 1158 02/19/16 0531 02/20/16 0337 02/21/16 0521  WBC 7.7 4.0 3.2* 4.2  NEUTROABS 5.4  --   --   --   HGB 11.3* 9.0* 8.8* 8.3*  HCT 35.2* 28.0* 28.3* 26.5*  MCV 104.5* 104.9* 108.4* 107.7*  PLT 209 133* 146* 167   Basic Metabolic Panel:  Recent Labs Lab 02/18/16 1158 02/18/16 2059 02/19/16 0531 02/20/16 0337 02/21/16 0521  NA 136  --  138 141 140  K 4.3  --  3.4* 4.0 3.4*  CL 97*  --  112* 116* 119*  CO2 21*  --  18* 18* 14*  GLUCOSE 83  --  80 112* 73  BUN 34*  --  24* 12 7  CREATININE 2.51*  --  1.70* 1.09* 0.79  CALCIUM 8.9  --  7.0* 8.1* 8.2*  MG  --  1.0*  --   --   --   PHOS  --  3.5  --   --   --    GFR: Estimated Creatinine Clearance: 49.1 mL/min (by C-G formula based on SCr of 0.79 mg/dL). Liver Function Tests:  Recent Labs Lab 02/18/16 1158 02/19/16 0531 02/20/16 0337  AST 36 33 30  ALT 12* 9* 10*  ALKPHOS 151* 117 118  BILITOT 1.2 0.5 0.7  PROT 7.3 5.3* 5.5*  ALBUMIN 3.2* 2.2* 2.2*   No results for input(s): LIPASE, AMYLASE in the last 168 hours.  Recent Labs Lab 02/18/16 1213 02/19/16 0829 02/20/16 0337  02/21/16 0521  AMMONIA 76* 19 45* 44*   Coagulation Profile: No results for input(s): INR, PROTIME in the last 168 hours. Cardiac Enzymes: No results for input(s): CKTOTAL, CKMB, CKMBINDEX, TROPONINI in the last 168 hours. BNP (last 3 results) No results for input(s): PROBNP in the last 8760 hours. HbA1C: No results for input(s): HGBA1C in the last 72 hours. CBG: No results for input(s): GLUCAP in the last 168 hours. Lipid Profile: No results for input(s): CHOL, HDL, LDLCALC, TRIG, CHOLHDL, LDLDIRECT in the last 72 hours. Thyroid Function Tests: No results for input(s): TSH, T4TOTAL, FREET4, T3FREE, THYROIDAB in the last 72 hours. Anemia Panel: No results for input(s): VITAMINB12, FOLATE, FERRITIN, TIBC, IRON, RETICCTPCT in the last 72 hours. Urine analysis:    Component Value Date/Time   COLORURINE YELLOW 02/18/2016 1224  APPEARANCEUR CLOUDY (A) 02/18/2016 1224   LABSPEC 1.008 02/18/2016 1224   PHURINE 5.5 02/18/2016 1224   GLUCOSEU NEGATIVE 02/18/2016 1224   GLUCOSEU NEG mg/dL 21/30/865711/30/2009 [id number]   HGBUR NEGATIVE 02/18/2016 1224   HGBUR negative 03/17/2008 1240   BILIRUBINUR NEGATIVE 02/18/2016 1224   KETONESUR NEGATIVE 02/18/2016 1224   PROTEINUR NEGATIVE 02/18/2016 1224   UROBILINOGEN 0.2 12/30/2013 1251   NITRITE NEGATIVE 02/18/2016 1224   LEUKOCYTESUR SMALL (A) 02/18/2016 1224   Sepsis Labs: @LABRCNTIP (procalcitonin:4,lacticidven:4)  ) Recent Results (from the past 240 hour(s))  Culture, Urine     Status: Abnormal   Collection Time: 02/18/16 12:24 PM  Result Value Ref Range Status   Specimen Description URINE, RANDOM  Final   Special Requests NONE  Final   Culture MULTIPLE SPECIES PRESENT, SUGGEST RECOLLECTION (A)  Final   Report Status 02/20/2016 FINAL  Final  MRSA PCR Screening     Status: None   Collection Time: 02/18/16  8:31 PM  Result Value Ref Range Status   MRSA by PCR NEGATIVE NEGATIVE Final    Comment:        The GeneXpert MRSA Assay (FDA approved  for NASAL specimens only), is one component of a comprehensive MRSA colonization surveillance program. It is not intended to diagnose MRSA infection nor to guide or monitor treatment for MRSA infections.   Culture, blood (routine x 2)     Status: None (Preliminary result)   Collection Time: 02/18/16  8:59 PM  Result Value Ref Range Status   Specimen Description BLOOD RIGHT ANTECUBITAL  Final   Special Requests BOTTLES DRAWN AEROBIC AND ANAEROBIC 10CC  Final   Culture NO GROWTH 3 DAYS  Final   Report Status PENDING  Incomplete  Culture, blood (routine x 2)     Status: None (Preliminary result)   Collection Time: 02/18/16  9:06 PM  Result Value Ref Range Status   Specimen Description BLOOD LEFT ANTECUBITAL  Final   Special Requests BOTTLES DRAWN AEROBIC AND ANAEROBIC 5CC  Final   Culture NO GROWTH 3 DAYS  Final   Report Status PENDING  Incomplete      Radiology Studies: No results found.   Scheduled Meds: . feeding supplement (ENSURE ENLIVE)  237 mL Oral BID BM  . folic acid  1 mg Oral Daily  . heparin  5,000 Units Subcutaneous Q8H  . lactulose  30 g Oral TID  . LORazepam  4 mg Intravenous Once  . mirtazapine  15 mg Oral QHS  . multivitamin with minerals  1 tablet Oral Daily  . potassium chloride  40 mEq Oral Once  . sodium chloride flush  3 mL Intravenous Q12H  . thiamine  100 mg Oral Daily   Continuous Infusions: . sodium chloride 100 mL/hr at 02/21/16 0659     LOS: 3 days   Time Spent in minutes   30 minutes  Amri Lien D.O. on 02/21/2016 at 10:58 AM  Between 7am to 7pm - Pager - [phone]  After 7pm go to [email or web address] - password [email or web address]  And look for the night coverage person covering for me after hours  [facility] Office  [phone]

## 2016-02-22 ENCOUNTER — Inpatient Hospital Stay (HOSPITAL_COMMUNITY): Payer: Commercial Managed Care - HMO

## 2016-02-22 DIAGNOSIS — Z888 Allergy status to other drugs, medicaments and biological substances status: Secondary | ICD-10-CM

## 2016-02-22 DIAGNOSIS — Z79899 Other long term (current) drug therapy: Secondary | ICD-10-CM

## 2016-02-22 LAB — CBC
HEMATOCRIT: 26 % — AB (ref 36.0–46.0)
Hemoglobin: 8.1 g/dL — ABNORMAL LOW (ref 12.0–15.0)
MCH: 33.9 pg (ref 26.0–34.0)
MCHC: 31.2 g/dL (ref 30.0–36.0)
MCV: 108.8 fL — AB (ref 78.0–100.0)
PLATELETS: 174 10*3/uL (ref 150–400)
RBC: 2.39 MIL/uL — AB (ref 3.87–5.11)
RDW: 13.6 % (ref 11.5–15.5)
WBC: 3.7 10*3/uL — ABNORMAL LOW (ref 4.0–10.5)

## 2016-02-22 LAB — COMPREHENSIVE METABOLIC PANEL
ALK PHOS: 108 U/L (ref 38–126)
ALT: 10 U/L — AB (ref 14–54)
AST: 34 U/L (ref 15–41)
Albumin: 2 g/dL — ABNORMAL LOW (ref 3.5–5.0)
Anion gap: 4 — ABNORMAL LOW (ref 5–15)
BILIRUBIN TOTAL: 0.5 mg/dL (ref 0.3–1.2)
CALCIUM: 8.4 mg/dL — AB (ref 8.9–10.3)
CO2: 18 mmol/L — ABNORMAL LOW (ref 22–32)
CREATININE: 0.75 mg/dL (ref 0.44–1.00)
Chloride: 123 mmol/L — ABNORMAL HIGH (ref 101–111)
Glucose, Bld: 86 mg/dL (ref 65–99)
Potassium: 4 mmol/L (ref 3.5–5.1)
Sodium: 145 mmol/L (ref 135–145)
TOTAL PROTEIN: 5 g/dL — AB (ref 6.5–8.1)

## 2016-02-22 LAB — AMMONIA: AMMONIA: 33 umol/L (ref 9–35)

## 2016-02-22 MED ORDER — RIFAXIMIN 550 MG PO TABS
550.0000 mg | ORAL_TABLET | Freq: Two times a day (BID) | ORAL | Status: DC
  Administered 2016-02-22 – 2016-02-25 (×7): 550 mg via ORAL
  Filled 2016-02-22 (×7): qty 1

## 2016-02-22 MED ORDER — LIDOCAINE HCL 1 % IJ SOLN
INTRAMUSCULAR | Status: AC
  Filled 2016-02-22: qty 20

## 2016-02-22 MED ORDER — ARIPIPRAZOLE 5 MG PO TABS
5.0000 mg | ORAL_TABLET | Freq: Every day | ORAL | Status: DC
  Administered 2016-02-22 – 2016-02-24 (×3): 5 mg via ORAL
  Filled 2016-02-22 (×3): qty 1

## 2016-02-22 NOTE — Care Management Important Message (Signed)
Important Message  Patient Details  Name: Kathryn [name] MRN: [id number] Date of Birth: 1950-08-21   Medicare Important Message Given:  Yes    [name], Kawthar Ennen Abena 02/22/2016, 12:09 PM

## 2016-02-22 NOTE — Progress Notes (Signed)
Patient ID: Kathryn [name], female   DOB: [birth year], 65 y.o.   MRN: [id number]   US guided paracentesis requested today Limited US Abd was performed No fluid located/identified  No paracentesis performed  Pt sent back to room

## 2016-02-22 NOTE — Consult Note (Signed)
[place] Psychiatry Consult   Reason for Consult:  Hallucination due toHepatic encephalopathy and chronic alcoholic cirrhosis Referring Physician:  Dr. Ree Kida Patient Identification: Kathryn [name] MRN:  [id number] Principal Diagnosis: Visual hallucinations Diagnosis:   Patient Active Problem List   Diagnosis Date Noted  . Hepatic encephalopathy ([place]) [K72.90] 02/18/2016  . Visual hallucinations [R44.1] 02/18/2016  . Hypotension [I95.9] 02/18/2016  . Acute renal failure (ARF) ([place]) [N17.9] 02/18/2016  . Chronic pain [G89.29] 02/18/2016  . Abdominal pain [R10.9] 03/26/2015  . Acute on chronic pancreatitis ([place]) [K85.90, K86.1] 03/26/2015  . Hypokalemia [E87.6] 03/26/2015  . Palpitation [R00.2] 03/26/2015  . Hypomagnesemia [E83.42] 03/26/2015  . Insomnia [G47.00] 03/17/2012  . Diarrhea [R19.7] 02/23/2012  . Hypoalbuminemia [E88.09] 02/21/2012  . Severe protein-calorie malnutrition ([place]) [E43] 10/21/2011  . Ascites [R18.8] 09/22/2011  . Alcoholic liver disease ([place]) [K70.9] 09/22/2011  . EtOH dependence ([place]) [F10.20] 07/27/2011  . Essential hypertension [I10] 09/16/2009  . SCOLIOSIS [M41.20] 08/17/2009  . UNSPECIFIED MONONEURITIS OF UPPER LIMB [G56.90] 06/12/2008  . HYPERLIPIDEMIA [E78.5] 02/07/2007  . GERD [K21.9] 02/07/2007  . OSTEOARTHRITIS [M19.90] 02/07/2007  . Osteoporosis [M81.0] 02/07/2007    Total Time spent with patient: 45 minutes  Subjective:   Kathryn [name] is a 65 y.o. female patient admitted with hallucinations.  HPI:  Kathryn [name] a 65 y.o.femaleSeen, chart reviewed for this face-to-face psychiatric consultation and evaluation of increased symptoms of auditory/visual hallucinations, on and off confusion since admitted to the hospital. Patient is awake, alert, oriented 3. Patient is calm and cooperative during this evaluation. Patient was diagnosed with hepatic encephalopathy and has been receiving lactulose for hyperammonemia. Patient required brief  period of restraints to stop her detach and flexiseal. Patient stated that she has been suffering with emotional difficulties since her husband passed away 4 years ago, not able to sleep well, dreaming a lot in which she has been laughing at her by people for limping but she has problem with hip surgery. Patient also reportedly had multiple surgeries in the past. Patient appeared dysphoric and tearful because she had had gloves. Patient was explained the reasons for keeping them to prevent removing the tubes and IV line. Patient denies active suicidal/homicidal ideation, intention or plans. Patient has no history of suicidal attempt. Patient reported her mother suffered with multiple medical problems and required to be in a nursing home placement and required feeding tube in the past. Patient reported she has been taking Remeron but continued to have problems with sleep.Patient also reported she uses very little  Medical history: Patient with medical history significant of alcoholic cirrhosis, GERD, HLD, HTN, MI, osteomyelitis of the left foot status post amputation, pancreatitis, peripheral vascular disease presenting w/ visual hallucinations and general is confusion. Level V caveat applies as patient is unable to provide history. Family not present at time of admission. History provided by patient, EDP, and nursing reports. Patient has approximately 1-2 day history of visual hallucinations. She reports seeing people crawling under her house, place and to trucks towing multiple cars out of her driveway, staying up all night watching people in her driveway. Patient does endorse that these people to talk to her. No history of psychiatric problems. Denies any focal complaints such as fevers, abdominal swelling, abdominal pain, dysuria, frequency, back pain, rash, neck stiffness, headache, cough, chest pain. Patient without recollection of these events. Patient does recall several occurrences over the past week of  certain activities that seemed bizarre such as filling up a cup of water and placing  on the counter, turning around to do something else and when she turns back the water is gone. Patient isof these events. Patient has no recollection of reports of hallucinations. Patient states that she administers her own medications.  Past Psychiatric History: Patient has no history of acute psychiatric hospitalization, alcohol detox treatment or rehabilitation treatment.  Risk to Self: Is patient at risk for suicide?: No Risk to Others:   Prior Inpatient Therapy:   Prior Outpatient Therapy:    Past Medical History:  Past Medical History:  Diagnosis Date  . Alcoholic liver disease ([place])   . Arthritis    "all over"  . Ascites 09/22/11   "first time for , now resolved  . Dysrhythmia    "irregular"  . Fracture, humerus 07/2011   right  . GERD (gastroesophageal reflux disease)   . High cholesterol   . History of blood transfusion 07/2011   "when I had elbow surgery"  . Hypertension    not current  . Myocardial infarction    NSTEMI 12/2011 in setting of septic shock  . Osteomyelitis ([place])    left foot  . Osteoporosis   . Pancreatitis several years ago  . Peripheral vascular disease ([place])   . Staph infection    left foot    Past Surgical History:  Procedure Laterality Date  . AMPUTATION     two toes on left foot  . ARTHROSCOPIC REPAIR ACL     right knee  . BUNIONECTOMY     both feet  . ESOPHAGOGASTRODUODENOSCOPY  09/23/2011   Procedure: ESOPHAGOGASTRODUODENOSCOPY (EGD);  Surgeon: Lear Ng, MD;  Location[facility] ENDOSCOPY;  Service: Endoscopy;  Laterality: N/A;  . ESOPHAGOGASTRODUODENOSCOPY (EGD) WITH PROPOFOL N/A 08/22/2012   Procedure: ESOPHAGOGASTRODUODENOSCOPY (EGD) WITH PROPOFOL;  Surgeon: Arta Silence, MD;  Location: [facility] ENDOSCOPY;  Service: Endoscopy;  Laterality: N/A;  . EUS N/A 03/25/2015   Procedure: UPPER ENDOSCOPIC ULTRASOUND (EUS) RADIAL;  Surgeon: Arta Silence, MD;   Location: [facility] ENDOSCOPY;  Service: Endoscopy;  Laterality: N/A;  . FINE NEEDLE ASPIRATION N/A 03/25/2015   Procedure: FINE NEEDLE ASPIRATION (FNA) RADIAL;  Surgeon: Arta Silence, MD;  Location: [facility] ENDOSCOPY;  Service: Endoscopy;  Laterality: N/A;  . FRACTURE SURGERY  2013   Right elbow  . JOINT REPLACEMENT     right hip   . LEG SURGERY Left    Correction of unequal leg length  . ORIF HUMERUS FRACTURE  07/26/2011   Procedure: OPEN REDUCTION INTERNAL FIXATION (ORIF) DISTAL HUMERUS FRACTURE;  Surgeon: Rozanna Box, MD;  Location: [place];  Service: Orthopedics;  Laterality: Right;  . ORIF WRIST FRACTURE Left 12/26/2014   Procedure: OPEN REDUCTION INTERNAL FIXATION (ORIF) WRIST FRACTURE;  Surgeon: Meredith Pel, MD;  Location: [place];  Service: Orthopedics;  Laterality: Left;  . PARACENTESIS  09/22/11   2.2L  . TOTAL HIP ARTHROPLASTY  ~ 2003   right  . TRANSMETATARSAL AMPUTATION  02/16/2012   Procedure: TRANSMETATARSAL AMPUTATION;  Surgeon: Newt Minion, MD;  Location: [place];  Service: Orthopedics;  Laterality: Left;  . TUBAL LIGATION  1980   Family History: History reviewed. No pertinent family history. Family Psychiatric  History: Noncontributory Social History:  History  Alcohol Use No    Comment: "last drink was day before OR in 07/2011"     History  Drug Use No    Social History   Social History  . Marital status: Widowed    Spouse name: N/A  . Number of children: N/A  .  Years of education: N/A   Social History Main Topics  . Smoking status: Never Smoker  . Smokeless tobacco: Never Used  . Alcohol use No     Comment: "last drink was day before OR in 07/2011"  . Drug use: No  . Sexual activity: Not Currently   Other Topics Concern  . None   Social History Narrative   Widowed 2013   2 children in the area   Additional Social History:    Allergies:   Allergies  Allergen Reactions  . Codeine Hives    Causes blisters  . Tramadol Other (See Comments)    Loss of  consciousness     Labs:  Results for orders placed or performed during the hospital encounter of 02/18/16 (from the past 48 hour(s))  CBC     Status: Abnormal   Collection Time: 02/21/16  5:21 AM  Result Value Ref Range   WBC 4.2 4.0 - 10.5 K/uL    Comment: WHITE COUNT CONFIRMED ON SMEAR   RBC 2.46 (L) 3.87 - 5.11 MIL/uL   Hemoglobin 8.3 (L) 12.0 - 15.0 g/dL   HCT 99.9 (L) 35.5 - 95.7 %   MCV 107.7 (H) 78.0 - 100.0 fL   MCH 33.7 26.0 - 34.0 pg   MCHC 31.3 30.0 - 36.0 g/dL   RDW 27.9 51.3 - 08.2 %   Platelets 167 150 - 400 K/uL  Basic metabolic panel     Status: Abnormal   Collection Time: 02/21/16  5:21 AM  Result Value Ref Range   Sodium 140 135 - 145 mmol/L   Potassium 3.4 (L) 3.5 - 5.1 mmol/L   Chloride 119 (H) 101 - 111 mmol/L   CO2 14 (L) 22 - 32 mmol/L   Glucose, Bld 73 65 - 99 mg/dL   BUN 7 6 - 20 mg/dL   Creatinine, Ser 5.77 0.44 - 1.00 mg/dL   Calcium 8.2 (L) 8.9 - 10.3 mg/dL   GFR calc non Af Amer >60 >60 mL/min   GFR calc Af Amer >60 >60 mL/min    Comment: (NOTE) The eGFR has been calculated using the CKD EPI equation. This calculation has not been validated in all clinical situations. eGFR's persistently <60 mL/min signify possible Chronic Kidney Disease.    Anion gap 7 5 - 15  Ammonia     Status: Abnormal   Collection Time: 02/21/16  5:21 AM  Result Value Ref Range   Ammonia 44 (H) 9 - 35 umol/L  CBC     Status: Abnormal   Collection Time: 02/22/16  4:04 AM  Result Value Ref Range   WBC 3.7 (L) 4.0 - 10.5 K/uL   RBC 2.39 (L) 3.87 - 5.11 MIL/uL   Hemoglobin 8.1 (L) 12.0 - 15.0 g/dL   HCT 49.7 (L) 95.2 - 02.0 %   MCV 108.8 (H) 78.0 - 100.0 fL   MCH 33.9 26.0 - 34.0 pg   MCHC 31.2 30.0 - 36.0 g/dL   RDW 42.9 54.9 - 58.0 %   Platelets 174 150 - 400 K/uL  Comprehensive metabolic panel     Status: Abnormal   Collection Time: 02/22/16  4:04 AM  Result Value Ref Range   Sodium 145 135 - 145 mmol/L   Potassium 4.0 3.5 - 5.1 mmol/L   Chloride 123 (H) 101  - 111 mmol/L   CO2 18 (L) 22 - 32 mmol/L   Glucose, Bld 86 65 - 99 mg/dL   BUN <5 (L) 6 - 20 mg/dL  Creatinine, Ser 0.75 0.44 - 1.00 mg/dL   Calcium 8.4 (L) 8.9 - 10.3 mg/dL   Total Protein 5.0 (L) 6.5 - 8.1 g/dL   Albumin 2.0 (L) 3.5 - 5.0 g/dL   AST 34 15 - 41 U/L   ALT 10 (L) 14 - 54 U/L   Alkaline Phosphatase 108 38 - 126 U/L   Total Bilirubin 0.5 0.3 - 1.2 mg/dL   GFR calc non Af Amer >60 >60 mL/min   GFR calc Af Amer >60 >60 mL/min    Comment: (NOTE) The eGFR has been calculated using the CKD EPI equation. This calculation has not been validated in all clinical situations. eGFR's persistently <60 mL/min signify possible Chronic Kidney Disease.    Anion gap 4 (L) 5 - 15  Ammonia     Status: None   Collection Time: 02/22/16  4:04 AM  Result Value Ref Range   Ammonia 33 9 - 35 umol/L    Current Facility-Administered Medications  Medication Dose Route Frequency Provider Last Rate Last Dose  . 0.9 %  sodium chloride infusion   Intravenous Continuous Waldemar Dickens, MD 100 mL/hr at 02/22/16 [phone]    . feeding supplement (ENSURE ENLIVE) (ENSURE ENLIVE) liquid 237 mL  237 mL Oral BID BM Maryann Mikhail, DO   237 mL at 02/22/16 [phone]  . folic acid (FOLVITE) tablet 1 mg  1 mg Oral Daily Waldemar Dickens, MD   1 mg at 02/22/16 0833  . heparin injection 5,000 Units  5,000 Units Subcutaneous Q8H Waldemar Dickens, MD   5,000 Units at 02/22/16 0557  . lactulose (CHRONULAC) 10 GM/15ML solution 30 g  30 g Oral TID Waldemar Dickens, MD   30 g at 02/22/16 [phone]  . LORazepam (ATIVAN) tablet 1 mg  1 mg Oral Q4H PRN Maryann Mikhail, DO       Or  . LORazepam (ATIVAN) injection 1 mg  1 mg Intravenous Q4H PRN Maryann Mikhail, DO   1 mg at 02/22/16 0557  . mirtazapine (REMERON) tablet 15 mg  15 mg Oral QHS Waldemar Dickens, MD   15 mg at 02/21/16 2133  . multivitamin with minerals tablet 1 tablet  1 tablet Oral Daily Waldemar Dickens, MD   1 tablet at 02/22/16 [phone]  . ondansetron (ZOFRAN) tablet 4 mg  4  mg Oral Q6H PRN Waldemar Dickens, MD       Or  . ondansetron [facility]) injection 4 mg  4 mg Intravenous Q6H PRN Waldemar Dickens, MD      . oxyCODONE (Oxy IR/ROXICODONE) immediate release tablet 5 mg  5 mg Oral Q4H PRN Waldemar Dickens, MD      . potassium chloride SA (K-DUR,KLOR-CON) CR tablet 40 mEq  40 mEq Oral Once [employer], DO      . rifaximin (XIFAXAN) tablet 550 mg  550 mg Oral BID Maryann Mikhail, DO   550 mg at 02/22/16 1140  . sodium chloride flush (NS) 0.9 % injection 3 mL  3 mL Intravenous Q12H Waldemar Dickens, MD   3 mL at 02/22/16 0834  . thiamine (VITAMIN B-1) tablet 100 mg  100 mg Oral Daily Waldemar Dickens, MD   100 mg at 02/22/16 8366    Musculoskeletal: Strength & Muscle Tone: decreased Gait & Station: normal Patient leans: N/A  Psychiatric Specialty Exam: Physical Exam As per history and physical  ROS Complaining about auditory and visual hallucinations, dreams of people laughing at her and  sleep disturbance  Blood pressure 104/79, pulse 74, temperature 98.1 F (36.7 C), temperature source Oral, resp. rate (!) 22, height '4\' 11"'$  (1.499 m), weight 46.3 kg (102 lb), SpO2 98 %.Body mass index is 20.6 kg/m.  General Appearance: Disheveled and Guarded  Eye Contact:  Good  Speech:  Clear and Coherent and Slow  Volume:  Decreased  Mood:  Dysphoric  Affect:  Constricted and Depressed  Thought Process:  Coherent and Goal Directed  Orientation:  Full (Time, Place, and Person)  Thought Content:  Paranoid Ideation and Rumination  Suicidal Thoughts:  No  Homicidal Thoughts:  No  Memory:  Immediate;   Good Recent;   Fair Remote;   Fair  Judgement:  Impaired  Insight:  Fair  Psychomotor Activity:  Decreased  Concentration:  Concentration: Fair and Attention Span: Fair  Recall:  [employer] of Knowledge:  Good  Language:  Good  Akathisia:  Negative  Handed:  Right  AIMS (if indicated):     Assets:  Communication Skills Desire for Improvement Financial  Resources/Insurance Housing Intimacy Leisure Time Resilience Social Support Transportation  ADL's:  Impaired  Cognition:  WNL  Sleep:        Treatment Plan Summary: 65 years old female presented with hepatic encephalopathy and required lactulose therapy. Patient is reporting auditory/visual hallucinations, bad dreams, Poor appetite and insomnia. Patient denies previous history of psychiatric treatment or alcoholRehabilitation treatment. Patient denies active suicidal/ homicidal ideation, intention or plans.   Reviewed labs including ammonia level which is trending down and case discussed with the staff RN Patient benefit from continuation of the lactulose therapy for hyperammonemia We start Abilify 5 mg at bedtime for hallucinations and also insomnia Continue Remeron 15 mg at bedtime for poor appetite and insomnia Daily contact with patient to assess and evaluate symptoms and progress in treatment and Medication management  Appreciate psychiatric consultation and follow up as clinically required Please contact [phone] or [phone] if needs further assistance  Disposition: Patient does not meet criteria for psychiatric inpatient admission. Supportive therapy provided about ongoing stressors.  Ambrose Finland, MD 02/22/2016 12:31 PM

## 2016-02-22 NOTE — Progress Notes (Addendum)
PROGRESS NOTE    Kathryn Livingnn B Wrede  ZOX:[id number] DOB: [birth year] DOA: 02/18/2016 PCP: [name], MD   Chief Complaint  Patient presents with  . Altered Mental Status    Brief Narrative:  HPI on 02/18/2016 by Dr. Shelly Flattenavid Merrell Kathryn [name] is a 65 y.o. female with medical history significant of alcoholic cirrhosis, GERD, HLD, HTN, MI, osteomyelitis of the left foot status post amputation, pancreatitis, peripheral vascular disease presenting w/ visual hallucinations and general is confusion. Level V caveat applies as patient is unable to provide history. Family not present at time of admission. History provided by patient, EDP, and nursing reports. Patient has approximately 1-2 day history of visual hallucinations. She reports seeing people crawling under her house, place and to trucks towing multiple cars out of her driveway, staying up all night watching people in her driveway. Patient does endorse that these people to talk to her. No history of psychiatric problems. Denies any focal complaints such as fevers, abdominal swelling, abdominal pain, dysuria, frequency, back pain, rash, neck stiffness, headache, cough, chest pain. Patient without recollection of these events. Patient does recall several occurrences over the past week of certain activities that seemed bizarre such as filling up a cup of water and placing on the counter, turning around to do something else and when she turns back the water is gone. Patient isof these events. Patient has no recollection of reports of hallucinations. Patient states that she administers her own medications. Assessment & Plan   Hepatic encephalopathy/Hallucinations -Mental status waxes/wanes -Ammonia level 76 upon admission. Presented with visual hallucinations and confusion. -Patient does have history of alcoholic cirrhosis -CT head unremarkable -Chest x-ray and UTI unremarkable -Continue lactulose -Patient does follow-up with Dr. Dulce Sellarutlaw,  gastroenterologist. -Patient has not been taking lactulose at home.  -Ammonia level this morning 33 -Spoke with Dr. Dulce Sellarutlaw on 11/3.  Patient has not had symptoms of hepatic encephalopathy in the past. Agreed with lactulose at this time -Continue CIWA - has been receiving several doses of Ativan -Currently in restraints -GI consulted and appreciated- Spoke Dr. Madilyn FiremanHayes. Agreed with trying rifaximin -Despite lactulose, patient continues to have encephalopathy- currently no other source.  Abdominal US pending.  -Will also consult psych  Hypotension -Likely secondary to dehydration and continued diuretic use, GI losses -BP better today, continues to be low normal -Home medications, Aldactone, Lasix, propranolol held -Continue IV fluid  Acute on chronic kidney disease, stage III -Resolved, Likely secondary to dehydration -Creatinine 2.51 upon admission, currently 0.75 -Baseline creatinine approximately 1  Alcoholic cirrhosis -No evidence of SBP -Propranolol, Aldactone, Lasix currently held due to hypotension -Pending US Abd (?paracentesis)  History of alcohol abuse -Patient denies any recent alcohol use- however reports given to RN states she had a "drink" 2 weeks ago, partner also eluded to continued alcohol use -Continue CIWA protocol  Chronic pain -Patient on Dilaudid 2 mg at home although her UDS is negative for opiates -Continue pain control Neurontin as needed  Gout -Current stable  Depression -Continue Remeron  Chronic anemia -MCV shows normocytic to macrocytic -Baseline hemoglobin between 9-10 -Currently 8.1, likely dilutional- as patient has been receiving IVF  DVT Prophylaxis  heparin  Code Status: Full  Family Communication: None at bedside  Disposition Plan: Admitted. Continue to monitor and stepdown due to hypotension and encephalopathy.   Consultants Gastroenterology, via phone  Procedures  None  Antibiotics   Anti-infectives    None       Subjective:   Kathryn [name] seen and examined  today. Patient currently sleeping but easy to awake.  Denies pain, feels fine and would like to go back to sleep.  Objective:   Vitals:   02/21/16 1500 02/21/16 1904 02/21/16 2302 02/22/16 0311  BP: (!) 148/67 121/71 113/79 95/65  Pulse: (!) 103 87 (!) 107 82  Resp: (!) 21 19 (!) 28 (!) 30  Temp: 98.5 F (36.9 C) 97.8 F (36.6 C) 98.1 F (36.7 C) 97.7 F (36.5 C)  TempSrc: Oral Axillary Axillary Axillary  SpO2: 100% 100% 100% 95%  Weight:      Height:        Intake/Output Summary (Last 24 hours) at 02/22/16 0931 Last data filed at 02/22/16 0800  Gross per 24 hour  Intake          2151.67 ml  Output                0 ml  Net          2151.67 ml   Filed Weights   02/18/16 1150  Weight: 46.3 kg (102 lb)    Exam  General: Well developed, well nourished, NAD  HEENT: NCAT, mucous membranes moist.   Cardiovascular: S1 S2 auscultated, 2/6SEM, RRR  Respiratory: Clear to auscultation bilaterally   Abdomen: Soft, nontender, nondistended, + bowel sounds  Extremities: warm dry without cyanosis clubbing or edema. Left foot transmetatarsal amputation  Neuro: AAOx3, nonfocal  Data Reviewed: I have personally reviewed following labs and imaging studies  CBC:  Recent Labs Lab 02/18/16 1158 02/19/16 0531 02/20/16 0337 02/21/16 0521 02/22/16 0404  WBC 7.7 4.0 3.2* 4.2 3.7*  NEUTROABS 5.4  --   --   --   --   HGB 11.3* 9.0* 8.8* 8.3* 8.1*  HCT 35.2* 28.0* 28.3* 26.5* 26.0*  MCV 104.5* 104.9* 108.4* 107.7* 108.8*  PLT 209 133* 146* 167 174   Basic Metabolic Panel:  Recent Labs Lab 02/18/16 1158 02/18/16 2059 02/19/16 0531 02/20/16 0337 02/21/16 0521 02/22/16 0404  NA 136  --  138 141 140 145  K 4.3  --  3.4* 4.0 3.4* 4.0  CL 97*  --  112* 116* 119* 123*  CO2 21*  --  18* 18* 14* 18*  GLUCOSE 83  --  80 112* 73 86  BUN 34*  --  24* 12 7 <5*  CREATININE 2.51*  --  1.70* 1.09* 0.79 0.75  CALCIUM 8.9  --  7.0*  8.1* 8.2* 8.4*  MG  --  1.0*  --   --   --   --   PHOS  --  3.5  --   --   --   --    GFR: Estimated Creatinine Clearance: 49.1 mL/min (by C-G formula based on SCr of 0.75 mg/dL). Liver Function Tests:  Recent Labs Lab 02/18/16 1158 02/19/16 0531 02/20/16 0337 02/22/16 0404  AST 36 33 30 34  ALT 12* 9* 10* 10*  ALKPHOS 151* 117 118 108  BILITOT 1.2 0.5 0.7 0.5  PROT 7.3 5.3* 5.5* 5.0*  ALBUMIN 3.2* 2.2* 2.2* 2.0*   No results for input(s): LIPASE, AMYLASE in the last 168 hours.  Recent Labs Lab 02/18/16 1213 02/19/16 0829 02/20/16 0337 02/21/16 0521 02/22/16 0404  AMMONIA 76* 19 45* 44* 33   Coagulation Profile: No results for input(s): INR, PROTIME in the last 168 hours. Cardiac Enzymes: No results for input(s): CKTOTAL, CKMB, CKMBINDEX, TROPONINI in the last 168 hours. BNP (last 3 results) No results for input(s): PROBNP in the last  8760 hours. HbA1C: No results for input(s): HGBA1C in the last 72 hours. CBG: No results for input(s): GLUCAP in the last 168 hours. Lipid Profile: No results for input(s): CHOL, HDL, LDLCALC, TRIG, CHOLHDL, LDLDIRECT in the last 72 hours. Thyroid Function Tests: No results for input(s): TSH, T4TOTAL, FREET4, T3FREE, THYROIDAB in the last 72 hours. Anemia Panel: No results for input(s): VITAMINB12, FOLATE, FERRITIN, TIBC, IRON, RETICCTPCT in the last 72 hours. Urine analysis:    Component Value Date/Time   COLORURINE YELLOW 02/18/2016 1224   APPEARANCEUR CLOUDY (A) 02/18/2016 1224   LABSPEC 1.008 02/18/2016 1224   PHURINE 5.5 02/18/2016 1224   GLUCOSEU NEGATIVE 02/18/2016 1224   GLUCOSEU NEG mg/dL 40/98/119111/30/2009 [id number]   HGBUR NEGATIVE 02/18/2016 1224   HGBUR negative 03/17/2008 1240   BILIRUBINUR NEGATIVE 02/18/2016 1224   KETONESUR NEGATIVE 02/18/2016 1224   PROTEINUR NEGATIVE 02/18/2016 1224   UROBILINOGEN 0.2 12/30/2013 1251   NITRITE NEGATIVE 02/18/2016 1224   LEUKOCYTESUR SMALL (A) 02/18/2016 1224   Sepsis  Labs: @LABRCNTIP (procalcitonin:4,lacticidven:4)  ) Recent Results (from the past 240 hour(s))  Culture, Urine     Status: Abnormal   Collection Time: 02/18/16 12:24 PM  Result Value Ref Range Status   Specimen Description URINE, RANDOM  Final   Special Requests NONE  Final   Culture MULTIPLE SPECIES PRESENT, SUGGEST RECOLLECTION (A)  Final   Report Status 02/20/2016 FINAL  Final  MRSA PCR Screening     Status: None   Collection Time: 02/18/16  8:31 PM  Result Value Ref Range Status   MRSA by PCR NEGATIVE NEGATIVE Final    Comment:        The GeneXpert MRSA Assay (FDA approved for NASAL specimens only), is one component of a comprehensive MRSA colonization surveillance program. It is not intended to diagnose MRSA infection nor to guide or monitor treatment for MRSA infections.   Culture, blood (routine x 2)     Status: None (Preliminary result)   Collection Time: 02/18/16  8:59 PM  Result Value Ref Range Status   Specimen Description BLOOD RIGHT ANTECUBITAL  Final   Special Requests BOTTLES DRAWN AEROBIC AND ANAEROBIC 10CC  Final   Culture NO GROWTH 3 DAYS  Final   Report Status PENDING  Incomplete  Culture, blood (routine x 2)     Status: None (Preliminary result)   Collection Time: 02/18/16  9:06 PM  Result Value Ref Range Status   Specimen Description BLOOD LEFT ANTECUBITAL  Final   Special Requests BOTTLES DRAWN AEROBIC AND ANAEROBIC 5CC  Final   Culture NO GROWTH 3 DAYS  Final   Report Status PENDING  Incomplete      Radiology Studies: No results found.   Scheduled Meds: . feeding supplement (ENSURE ENLIVE)  237 mL Oral BID BM  . folic acid  1 mg Oral Daily  . heparin  5,000 Units Subcutaneous Q8H  . lactulose  30 g Oral TID  . mirtazapine  15 mg Oral QHS  . multivitamin with minerals  1 tablet Oral Daily  . potassium chloride  40 mEq Oral Once  . sodium chloride flush  3 mL Intravenous Q12H  . thiamine  100 mg Oral Daily   Continuous Infusions: .  sodium chloride 100 mL/hr at 02/22/16 0653     LOS: 4 days   Time Spent in minutes   30 minutes  Tor Tsuda D.O. on 02/22/2016 at 9:31 AM  Between 7am to 7pm - Pager - [phone]  After 7pm go to  [email or web address] - password [email or web address]  And look for the night coverage person covering for me after hours  [facility] Office  [phone]

## 2016-02-23 LAB — BASIC METABOLIC PANEL
Anion gap: 9 (ref 5–15)
CHLORIDE: 121 mmol/L — AB (ref 101–111)
CO2: 19 mmol/L — AB (ref 22–32)
Calcium: 8.7 mg/dL — ABNORMAL LOW (ref 8.9–10.3)
Creatinine, Ser: 0.7 mg/dL (ref 0.44–1.00)
GFR calc Af Amer: >60 mL/min (ref >60)
GFR calc non Af Amer: >60 mL/min (ref >60)
GLUCOSE: 69 mg/dL (ref 65–99)
POTASSIUM: 3.5 mmol/L (ref 3.5–5.1)
Sodium: 149 mmol/L — ABNORMAL HIGH (ref 135–145)

## 2016-02-23 LAB — CBC
HCT: 29.1 % — ABNORMAL LOW (ref 36.0–46.0)
HEMOGLOBIN: 8.9 g/dL — AB (ref 12.0–15.0)
MCH: 33.2 pg (ref 26.0–34.0)
MCHC: 30.6 g/dL (ref 30.0–36.0)
MCV: 108.6 fL — AB (ref 78.0–100.0)
Platelets: 227 10*3/uL (ref 150–400)
RBC: 2.68 MIL/uL — AB (ref 3.87–5.11)
RDW: 14.1 % (ref 11.5–15.5)
WBC: 4.3 10*3/uL (ref 4.0–10.5)

## 2016-02-23 LAB — CULTURE, BLOOD (ROUTINE X 2)
CULTURE: NO GROWTH
Culture: NO GROWTH

## 2016-02-23 LAB — AMMONIA: Ammonia: 58 umol/L — ABNORMAL HIGH (ref 9–35)

## 2016-02-23 NOTE — Progress Notes (Signed)
PROGRESS NOTE    Kathryn Livingnn B Berggren  ZOX:[id number] DOB: Sep 10, 1950 DOA: 02/18/2016 PCP: [name], MD   Chief Complaint  Patient presents with  . Altered Mental Status    Brief Narrative:  HPI on 02/18/2016 by Dr. Shelly Flattenavid Merrell Kathryn [name] is a 65 y.o. female with medical history significant of alcoholic cirrhosis, GERD, HLD, HTN, MI, osteomyelitis of the left foot status post amputation, pancreatitis, peripheral vascular disease presenting w/ visual hallucinations and general is confusion. Level V caveat applies as patient is unable to provide history. Family not present at time of admission. History provided by patient, EDP, and nursing reports. Patient has approximately 1-2 day history of visual hallucinations. She reports seeing people crawling under her house, place and to trucks towing multiple cars out of her driveway, staying up all night watching people in her driveway. Patient does endorse that these people to talk to her. No history of psychiatric problems. Denies any focal complaints such as fevers, abdominal swelling, abdominal pain, dysuria, frequency, back pain, rash, neck stiffness, headache, cough, chest pain. Patient without recollection of these events. Patient does recall several occurrences over the past week of certain activities that seemed bizarre such as filling up a cup of water and placing on the counter, turning around to do something else and when she turns back the water is gone. Patient isof these events. Patient has no recollection of reports of hallucinations. Patient states that she administers her own medications. Assessment & Plan   Hepatic encephalopathy/Hallucinations -Mental status waxes/wanes -Ammonia level 76 upon admission. Presented with visual hallucinations and confusion. -Patient does have history of alcoholic cirrhosis -CT head unremarkable -Chest x-ray and UTI unremarkable -Continue lactulose -Patient does follow-up with Dr. Dulce Sellarutlaw,  gastroenterologist. -Patient has not been taking lactulose at home.  -Ammonia level this morning 58 -Spoke with Dr. Dulce Sellarutlaw on 11/3.  Patient has not had symptoms of hepatic encephalopathy in the past. Agreed with lactulose at this time -Continue CIWA - has been receiving several doses of Ativan -Currently in restraints -GI consulted and appreciated- Spoke Dr. Madilyn FiremanHayes. Agreed with trying rifaximin -Despite lactulose, patient continues to have encephalopathy- currently no other source.  Abdominal US pending.  -Psychiatry consulted and appreciated:started abilify 5mg  QHS, Remeron 15mg  QHS  Hypotension -Likely secondary to dehydration and continued diuretic use, GI losses -BP better today, continues to be low normal -Home medications, Aldactone, Lasix, propranolol held -Continue IV fluid  Acute on chronic kidney disease, stage III -Resolved, Likely secondary to dehydration -Creatinine 2.51 upon admission, currently 0.70  Alcoholic cirrhosis -No evidence of SBP -Propranolol, Aldactone, Lasix currently held due to hypotension -Abdominal US: no significant ascitets  History of alcohol abuse -Patient denies any recent alcohol use- however reports given to RN states she had a "drink" 2 weeks ago, partner also eluded to continued alcohol use -Continue CIWA protocol  Chronic pain -Patient on Dilaudid 2 mg at home although her UDS is negative for opiates -Continue pain control Neurontin as needed  Gout -Current stable  Depression -Continue Remeron  Chronic anemia -MCV shows normocytic to macrocytic -Baseline hemoglobin between 9-10 -Currently 8.9, likely dilutional- as patient has been receiving IVF  DVT Prophylaxis  heparin  Code Status: Full  Family Communication: None at bedside  Disposition Plan: Admitted. Continue to monitor and stepdown due to hypotension and encephalopathy.   Consultants Gastroenterology, via phone Psychiatry   Procedures  Abdominal US  Antibiotics    Anti-infectives    Start     Dose/Rate Route Frequency  Ordered Stop   02/22/16 1000  rifaximin (XIFAXAN) tablet 550 mg     550 mg Oral 2 times daily 02/22/16 0951        Subjective:   Kathryn [name] seen and examined today. Patient feels she is not sleeping well at night.  Denies pain, chest pain, shortness of breath, abdominal pain, N/V/C/D.  Objective:   Vitals:   02/22/16 2340 02/23/16 0330 02/23/16 0710 02/23/16 1100  BP: (!) 114/96 115/81 107/79 118/82  Pulse: 87 84 83 87  Resp: (!) 23 (!) 29 (!) 26 (!) 25  Temp: 98.6 F (37 C) 98.5 F (36.9 C) 98.2 F (36.8 C) 98.7 F (37.1 C)  TempSrc: Oral Oral Oral Oral  SpO2: 97% 96% 98% 98%  Weight:      Height:        Intake/Output Summary (Last 24 hours) at 02/23/16 1144 Last data filed at 02/23/16 1100  Gross per 24 hour  Intake             3060 ml  Output                0 ml  Net             3060 ml   Filed Weights   02/18/16 1150  Weight: 46.3 kg (102 lb)    Exam  General: Well developed, well nourished, NAD  HEENT: NCAT, mucous membranes moist.   Cardiovascular: S1 S2 auscultated, 2/6SEM, RRR  Respiratory: Clear to auscultation bilaterally   Abdomen: Soft, nontender, nondistended, + bowel sounds  Extremities: warm dry without cyanosis clubbing or edema. Left foot transmetatarsal amputation  Neuro: AAOx3, nonfocal  Psych: Appropriate mood and affect  Data Reviewed: I have personally reviewed following labs and imaging studies  CBC:  Recent Labs Lab 02/18/16 1158 02/19/16 0531 02/20/16 0337 02/21/16 0521 02/22/16 0404 02/23/16 0314  WBC 7.7 4.0 3.2* 4.2 3.7* 4.3  NEUTROABS 5.4  --   --   --   --   --   HGB 11.3* 9.0* 8.8* 8.3* 8.1* 8.9*  HCT 35.2* 28.0* 28.3* 26.5* 26.0* 29.1*  MCV 104.5* 104.9* 108.4* 107.7* 108.8* 108.6*  PLT 209 133* 146* 167 174 227   Basic Metabolic Panel:  Recent Labs Lab 02/18/16 2059 02/19/16 0531 02/20/16 0337 02/21/16 0521 02/22/16 0404 02/23/16 0314  NA   --  138 141 140 145 149*  K  --  3.4* 4.0 3.4* 4.0 3.5  CL  --  112* 116* 119* 123* 121*  CO2  --  18* 18* 14* 18* 19*  GLUCOSE  --  80 112* 73 86 69  BUN  --  24* 12 7 <5* <5*  CREATININE  --  1.70* 1.09* 0.79 0.75 0.70  CALCIUM  --  7.0* 8.1* 8.2* 8.4* 8.7*  MG 1.0*  --   --   --   --   --   PHOS 3.5  --   --   --   --   --    GFR: Estimated Creatinine Clearance: 49.1 mL/min (by C-G formula based on SCr of 0.7 mg/dL). Liver Function Tests:  Recent Labs Lab 02/18/16 1158 02/19/16 0531 02/20/16 0337 02/22/16 0404  AST 36 33 30 34  ALT 12* 9* 10* 10*  ALKPHOS 151* 117 118 108  BILITOT 1.2 0.5 0.7 0.5  PROT 7.3 5.3* 5.5* 5.0*  ALBUMIN 3.2* 2.2* 2.2* 2.0*   No results for input(s): LIPASE, AMYLASE in the last 168 hours.  Recent Labs  Lab 02/19/16 0829 02/20/16 0337 02/21/16 0521 02/22/16 0404 02/23/16 0314  AMMONIA 19 45* 44* 33 58*   Coagulation Profile: No results for input(s): INR, PROTIME in the last 168 hours. Cardiac Enzymes: No results for input(s): CKTOTAL, CKMB, CKMBINDEX, TROPONINI in the last 168 hours. BNP (last 3 results) No results for input(s): PROBNP in the last 8760 hours. HbA1C: No results for input(s): HGBA1C in the last 72 hours. CBG: No results for input(s): GLUCAP in the last 168 hours. Lipid Profile: No results for input(s): CHOL, HDL, LDLCALC, TRIG, CHOLHDL, LDLDIRECT in the last 72 hours. Thyroid Function Tests: No results for input(s): TSH, T4TOTAL, FREET4, T3FREE, THYROIDAB in the last 72 hours. Anemia Panel: No results for input(s): VITAMINB12, FOLATE, FERRITIN, TIBC, IRON, RETICCTPCT in the last 72 hours. Urine analysis:    Component Value Date/Time   COLORURINE YELLOW 02/18/2016 1224   APPEARANCEUR CLOUDY (A) 02/18/2016 1224   LABSPEC 1.008 02/18/2016 1224   PHURINE 5.5 02/18/2016 1224   GLUCOSEU NEGATIVE 02/18/2016 1224   GLUCOSEU NEG mg/dL 16/01/9603 5409   HGBUR NEGATIVE 02/18/2016 1224   HGBUR negative 03/17/2008 1240    BILIRUBINUR NEGATIVE 02/18/2016 1224   KETONESUR NEGATIVE 02/18/2016 1224   PROTEINUR NEGATIVE 02/18/2016 1224   UROBILINOGEN 0.2 12/30/2013 1251   NITRITE NEGATIVE 02/18/2016 1224   LEUKOCYTESUR SMALL (A) 02/18/2016 1224   Sepsis Labs: @LABRCNTIP (procalcitonin:4,lacticidven:4)  ) Recent Results (from the past 240 hour(s))  Culture, Urine     Status: Abnormal   Collection Time: 02/18/16 12:24 PM  Result Value Ref Range Status   Specimen Description URINE, RANDOM  Final   Special Requests NONE  Final   Culture MULTIPLE SPECIES PRESENT, SUGGEST RECOLLECTION (A)  Final   Report Status 02/20/2016 FINAL  Final  MRSA PCR Screening     Status: None   Collection Time: 02/18/16  8:31 PM  Result Value Ref Range Status   MRSA by PCR NEGATIVE NEGATIVE Final    Comment:        The GeneXpert MRSA Assay (FDA approved for NASAL specimens only), is one component of a comprehensive MRSA colonization surveillance program. It is not intended to diagnose MRSA infection nor to guide or monitor treatment for MRSA infections.   Culture, blood (routine x 2)     Status: None (Preliminary result)   Collection Time: 02/18/16  8:59 PM  Result Value Ref Range Status   Specimen Description BLOOD RIGHT ANTECUBITAL  Final   Special Requests BOTTLES DRAWN AEROBIC AND ANAEROBIC 10CC  Final   Culture NO GROWTH 4 DAYS  Final   Report Status PENDING  Incomplete  Culture, blood (routine x 2)     Status: None (Preliminary result)   Collection Time: 02/18/16  9:06 PM  Result Value Ref Range Status   Specimen Description BLOOD LEFT ANTECUBITAL  Final   Special Requests BOTTLES DRAWN AEROBIC AND ANAEROBIC 5CC  Final   Culture NO GROWTH 4 DAYS  Final   Report Status PENDING  Incomplete      Radiology Studies: US Abdomen Limited  Result Date: 02/22/2016 CLINICAL DATA:  Ascites EXAM: US ABDOMEN LIMITED - ascites COMPARISON:  MRI abdomen 09/30/2015 FINDINGS: Ultrasound evaluation of the 4 quadrants of the  abdomen demonstrate no free fluid. IMPRESSION: No significant ascites. Electronically Signed   By: Genevive Bi M.D.   On: 02/22/2016 11:31     Scheduled Meds: . ARIPiprazole  5 mg Oral QHS  . feeding supplement (ENSURE ENLIVE)  237 mL Oral BID BM  .  folic acid  1 mg Oral Daily  . heparin  5,000 Units Subcutaneous Q8H  . lactulose  30 g Oral TID  . mirtazapine  15 mg Oral QHS  . multivitamin with minerals  1 tablet Oral Daily  . potassium chloride  40 mEq Oral Once  . rifaximin  550 mg Oral BID  . sodium chloride flush  3 mL Intravenous Q12H  . thiamine  100 mg Oral Daily   Continuous Infusions: . sodium chloride 100 mL/hr at 02/23/16 1100     LOS: 5 days   Time Spent in minutes   30 minutes  Harlan Vinal D.O. on 02/23/2016 at 11:44 AM  Between 7am to 7pm - Pager - [phone]  After 7pm go to [email or web address] - password [email or web address]  And look for the night coverage person covering for me after hours  [facility] Office  [phone]

## 2016-02-24 LAB — CBC
HEMATOCRIT: 26.2 % — AB (ref 36.0–46.0)
HEMOGLOBIN: 8.2 g/dL — AB (ref 12.0–15.0)
MCH: 33.6 pg (ref 26.0–34.0)
MCHC: 31.3 g/dL (ref 30.0–36.0)
MCV: 107.4 fL — AB (ref 78.0–100.0)
Platelets: 200 10*3/uL (ref 150–400)
RBC: 2.44 MIL/uL — AB (ref 3.87–5.11)
RDW: 14.2 % (ref 11.5–15.5)
WBC: 4 10*3/uL (ref 4.0–10.5)

## 2016-02-24 LAB — BASIC METABOLIC PANEL
Anion gap: 5 (ref 5–15)
BUN: <5 mg/dL — ABNORMAL LOW (ref 6–20)
CHLORIDE: 120 mmol/L — AB (ref 101–111)
CO2: 21 mmol/L — AB (ref 22–32)
CREATININE: 0.73 mg/dL (ref 0.44–1.00)
Calcium: 8.1 mg/dL — ABNORMAL LOW (ref 8.9–10.3)
GFR calc non Af Amer: >60 mL/min (ref >60)
Glucose, Bld: 92 mg/dL (ref 65–99)
POTASSIUM: 3.1 mmol/L — AB (ref 3.5–5.1)
SODIUM: 146 mmol/L — AB (ref 135–145)

## 2016-02-24 LAB — AMMONIA: Ammonia: 33 umol/L (ref 9–35)

## 2016-02-24 LAB — LIPASE, BLOOD: Lipase: 42 U/L (ref 11–51)

## 2016-02-24 MED ORDER — ALUM & MAG HYDROXIDE-SIMETH 200-200-20 MG/5ML PO SUSP
15.0000 mL | Freq: Four times a day (QID) | ORAL | Status: DC | PRN
  Filled 2016-02-24: qty 30

## 2016-02-24 MED ORDER — FAMOTIDINE 20 MG PO TABS
20.0000 mg | ORAL_TABLET | Freq: Two times a day (BID) | ORAL | Status: DC
  Administered 2016-02-24 – 2016-02-25 (×2): 20 mg via ORAL
  Filled 2016-02-24 (×2): qty 1

## 2016-02-24 MED ORDER — POTASSIUM CHLORIDE CRYS ER 20 MEQ PO TBCR
40.0000 meq | EXTENDED_RELEASE_TABLET | Freq: Once | ORAL | Status: AC
  Administered 2016-02-24: 40 meq via ORAL
  Filled 2016-02-24: qty 2

## 2016-02-24 NOTE — Care Management Important Message (Signed)
Important Message  Patient Details  Name: Kathryn [name] MRN: [id number] Date of Birth: Dec 09, 1950   Medicare Important Message Given:  Yes    [name], Dystany Duffy Abena 02/24/2016, 10:41 AM

## 2016-02-24 NOTE — Consult Note (Signed)
[place] Psychiatry Consult   Reason for Consult:  Hallucination due toHepatic encephalopathy and chronic alcoholic cirrhosis Referring Physician:  Dr. Ree Kida Patient Identification: Kathryn [name] MRN:  [id number] Principal Diagnosis: Visual hallucinations Diagnosis:   Patient Active Problem List   Diagnosis Date Noted  . Hepatic encephalopathy ([place]) [K72.90] 02/18/2016  . Visual hallucinations [R44.1] 02/18/2016  . Hypotension [I95.9] 02/18/2016  . Acute renal failure (ARF) ([place]) [N17.9] 02/18/2016  . Chronic pain [G89.29] 02/18/2016  . Abdominal pain [R10.9] 03/26/2015  . Acute on chronic pancreatitis ([place]) [K85.90, K86.1] 03/26/2015  . Hypokalemia [E87.6] 03/26/2015  . Palpitation [R00.2] 03/26/2015  . Hypomagnesemia [E83.42] 03/26/2015  . Insomnia [G47.00] 03/17/2012  . Diarrhea [R19.7] 02/23/2012  . Hypoalbuminemia [E88.09] 02/21/2012  . Severe protein-calorie malnutrition ([place]) [E43] 10/21/2011  . Ascites [R18.8] 09/22/2011  . Alcoholic liver disease ([place]) [K70.9] 09/22/2011  . EtOH dependence ([place]) [F10.20] 07/27/2011  . Essential hypertension [I10] 09/16/2009  . SCOLIOSIS [M41.20] 08/17/2009  . UNSPECIFIED MONONEURITIS OF UPPER LIMB [G56.90] 06/12/2008  . HYPERLIPIDEMIA [E78.5] 02/07/2007  . GERD [K21.9] 02/07/2007  . OSTEOARTHRITIS [M19.90] 02/07/2007  . Osteoporosis [M81.0] 02/07/2007    Total Time spent with patient: 45 minutes  Subjective:   Kathryn [name] is a 65 y.o. female patient admitted with hallucinations.  HPI:  [name] a 65 y.o.femaleSeen, chart reviewed for this face-to-face psychiatric consultation and evaluation of increased symptoms of auditory/visual hallucinations, on and off confusion since admitted to the hospital. Patient is awake, alert, oriented 3. Patient is calm and cooperative during this evaluation. Patient was diagnosed with hepatic encephalopathy and has been receiving lactulose for hyperammonemia. Patient required brief  period of restraints to stop her detach and flexiseal. Patient stated that she has been suffering with emotional difficulties since her husband passed away 4 years ago, not able to sleep well, dreaming a lot in which she has been laughing at her by people for limping but she has problem with hip surgery. Patient also reportedly had multiple surgeries in the past. Patient appeared dysphoric and tearful because she had had gloves. Patient was explained the reasons for keeping them to prevent removing the tubes and IV line. Patient denies active suicidal/homicidal ideation, intention or plans. Patient has no history of suicidal attempt. Patient reported her mother suffered with multiple medical problems and required to be in a nursing home placement and required feeding tube in the past. Patient reported she has been taking Remeron but continued to have problems with sleep.Patient also reported she uses very little  Medical history: Patient with medical history significant of alcoholic cirrhosis, GERD, HLD, HTN, MI, osteomyelitis of the left foot status post amputation, pancreatitis, peripheral vascular disease presenting w/ visual hallucinations and general is confusion. Level V caveat applies as patient is unable to provide history. Family not present at time of admission. History provided by patient, EDP, and nursing reports. Patient has approximately 1-2 day history of visual hallucinations. She reports seeing people crawling under her house, place and to trucks towing multiple cars out of her driveway, staying up all night watching people in her driveway. Patient does endorse that these people to talk to her. No history of psychiatric problems. Denies any focal complaints such as fevers, abdominal swelling, abdominal pain, dysuria, frequency, back pain, rash, neck stiffness, headache, cough, chest pain. Patient without recollection of these events. Patient does recall several occurrences over the past week of  certain activities that seemed bizarre such as filling up a cup of water and placing  on the counter, turning around to do something else and when she turns back the water is gone. Patient isof these events. Patient has no recollection of reports of hallucinations. Patient states that she administers her own medications.  Past Psychiatric History: Patient has no history of acute psychiatric hospitalization, alcohol detox treatment or rehabilitation treatment.  02/24/2016 Interval history: Patient seen today for psychiatric consultation follow-up. Patient appeared more awake, alert, oriented to time place person. Patient complaining of frequent loose stools. Patient denies side effect of the medication Abilify and mirtazapine and reportedly both medications are helpful to control her hallucinations especially visual hallucinations, insomnia and poor appetite. Patient has no safety concerns. Psychiatric consultation will sign off at this time as it is no further needs.  Risk to Self: Is patient at risk for suicide?: No Risk to Others:   Prior Inpatient Therapy:   Prior Outpatient Therapy:    Past Medical History:  Past Medical History:  Diagnosis Date  . Alcoholic liver disease ([place])   . Arthritis    "all over"  . Ascites 09/22/11   "first time for , now resolved  . Dysrhythmia    "irregular"  . Fracture, humerus 07/2011   right  . GERD (gastroesophageal reflux disease)   . High cholesterol   . History of blood transfusion 07/2011   "when I had elbow surgery"  . Hypertension    not current  . Myocardial infarction    NSTEMI 12/2011 in setting of septic shock  . Osteomyelitis ([place])    left foot  . Osteoporosis   . Pancreatitis several years ago  . Peripheral vascular disease ([place])   . Staph infection    left foot    Past Surgical History:  Procedure Laterality Date  . AMPUTATION     two toes on left foot  . ARTHROSCOPIC REPAIR ACL     right knee  . BUNIONECTOMY     both feet  .  ESOPHAGOGASTRODUODENOSCOPY  09/23/2011   Procedure: ESOPHAGOGASTRODUODENOSCOPY (EGD);  Surgeon: Lear Ng, MD;  [facility] END[facility]COPY;  Service: Endoscopy;  Laterality: N/A;  . ESOPHAGOGASTRODUODENOSCOPY (EGD) WITH PROPOFOL N/A 08/22/2012   Procedure: ESOPHAGOGASTRODUODENOSCOPY (EGD) WITH PROPOFOL;  Surgeon: Arta Silence, MD;  Location: [facility] ENDOSCOPY;  Service: Endoscopy;  Laterality: N/A;  . EUS N/A 03/25/2015   Procedure: UPPER ENDOSCOPIC ULTRASOUND (EUS) RADIAL;  Surgeon: Arta Silence, MD;  Location: [facility] ENDOSCOPY;  Service: Endoscopy;  Laterality: N/A;  . FINE NEEDLE ASPIRATION N/A 03/25/2015   Procedure: FINE NEEDLE ASPIRATION (FNA) RADIAL;  Surgeon: Arta Silence, MD;  Location: [facility] ENDOSCOPY;  Service: Endoscopy;  Laterality: N/A;  . FRACTURE SURGERY  2013   Right elbow  . JOINT REPLACEMENT     right hip   . LEG SURGERY Left    Correction of unequal leg length  . ORIF HUMERUS FRACTURE  07/26/2011   Procedure: OPEN REDUCTION INTERNAL FIXATION (ORIF) DISTAL HUMERUS FRACTURE;  Surgeon: Rozanna Box, MD;  Location: [place];  Service: Orthopedics;  Laterality: Right;  . ORIF WRIST FRACTURE Left 12/26/2014   Procedure: OPEN REDUCTION INTERNAL FIXATION (ORIF) WRIST FRACTURE;  Surgeon: Meredith Pel, MD;  Location: [facility];  Service: Orthopedics;  Laterality: Left;  . PARACENTESIS  09/22/11   2.2L  . TOTAL HIP ARTHROPLASTY  ~ 2003   right  . TRANSMETATARSAL AMPUTATION  02/16/2012   Procedure: TRANSMETATARSAL AMPUTATION;  Surgeon: Newt Minion, MD;  Location: [facility];  Service: Orthopedics;  Laterality: Left;  . TUBAL LIGATION  1980   Family History: History reviewed. No pertinent family history. Family Psychiatric  History: Noncontributory Social History:  History  Alcohol Use No    Comment: "last drink was day before OR in 07/2011"     History  Drug Use No    Social History   Social History  . Marital status: Widowed    Spouse name: N/A  . Number of children: N/A  . Years  of education: N/A   Social History Main Topics  . Smoking status: Never Smoker  . Smokeless tobacco: Never Used  . Alcohol use No     Comment: "last drink was day before OR in 07/2011"  . Drug use: No  . Sexual activity: Not Currently   Other Topics Concern  . None   Social History Narrative   Widowed 2013   2 children in the area   Additional Social History:    Allergies:   Allergies  Allergen Reactions  . Codeine Hives    Causes blisters  . Tramadol Other (See Comments)    Loss of consciousness     Labs:  Results for orders placed or performed during the hospital encounter of 02/18/16 (from the past 48 hour(s))  CBC     Status: Abnormal   Collection Time: 02/23/16  3:14 AM  Result Value Ref Range   WBC 4.3 4.0 - 10.5 K/uL   RBC 2.68 (L) 3.87 - 5.11 MIL/uL   Hemoglobin 8.9 (L) 12.0 - 15.0 g/dL   HCT 29.1 (L) 36.0 - 46.0 %   MCV 108.6 (H) 78.0 - 100.0 fL   MCH 33.2 26.0 - 34.0 pg   MCHC 30.6 30.0 - 36.0 g/dL   RDW 14.1 11.5 - 15.5 %   Platelets 227 150 - 400 K/uL  Basic metabolic panel     Status: Abnormal   Collection Time: 02/23/16  3:14 AM  Result Value Ref Range   Sodium 149 (H) 135 - 145 mmol/L   Potassium 3.5 3.5 - 5.1 mmol/L   Chloride 121 (H) 101 - 111 mmol/L   CO2 19 (L) 22 - 32 mmol/L   Glucose, Bld 69 65 - 99 mg/dL   BUN <5 (L) 6 - 20 mg/dL   Creatinine, Ser 0.70 0.44 - 1.00 mg/dL   Calcium 8.7 (L) 8.9 - 10.3 mg/dL   GFR calc non Af Amer >60 >60 mL/min   GFR calc Af Amer >60 >60 mL/min    Comment: (NOTE) The eGFR has been calculated using the CKD EPI equation. This calculation has not been validated in all clinical situations. eGFR's persistently <60 mL/min signify possible Chronic Kidney Disease.    Anion gap 9 5 - 15  Ammonia     Status: Abnormal   Collection Time: 02/23/16  3:14 AM  Result Value Ref Range   Ammonia 58 (H) 9 - 35 umol/L  Basic metabolic panel     Status: Abnormal   Collection Time: 02/24/16  3:21 AM  Result Value Ref  Range   Sodium 146 (H) 135 - 145 mmol/L   Potassium 3.1 (L) 3.5 - 5.1 mmol/L   Chloride 120 (H) 101 - 111 mmol/L   CO2 21 (L) 22 - 32 mmol/L   Glucose, Bld 92 65 - 99 mg/dL   BUN <5 (L) 6 - 20 mg/dL   Creatinine, Ser 0.73 0.44 - 1.00 mg/dL   Calcium 8.1 (L) 8.9 - 10.3 mg/dL   GFR calc non Af Amer >60 >60 mL/min   GFR  calc Af Amer >60 >60 mL/min    Comment: (NOTE) The eGFR has been calculated using the CKD EPI equation. This calculation has not been validated in all clinical situations. eGFR's persistently <60 mL/min signify possible Chronic Kidney Disease.    Anion gap 5 5 - 15  CBC     Status: Abnormal   Collection Time: 02/24/16  3:21 AM  Result Value Ref Range   WBC 4.0 4.0 - 10.5 K/uL   RBC 2.44 (L) 3.87 - 5.11 MIL/uL   Hemoglobin 8.2 (L) 12.0 - 15.0 g/dL   HCT 26.2 (L) 36.0 - 46.0 %   MCV 107.4 (H) 78.0 - 100.0 fL   MCH 33.6 26.0 - 34.0 pg   MCHC 31.3 30.0 - 36.0 g/dL   RDW 14.2 11.5 - 15.5 %   Platelets 200 150 - 400 K/uL  Ammonia     Status: None   Collection Time: 02/24/16  3:21 AM  Result Value Ref Range   Ammonia 33 9 - 35 umol/L    Current Facility-Administered Medications  Medication Dose Route Frequency Provider Last Rate Last Dose  . 0.9 %  sodium chloride infusion   Intravenous Continuous Waldemar Dickens, MD 100 mL/hr at 02/24/16 [phone]    . ARIPiprazole (ABILIFY) tablet 5 mg  5 mg Oral QHS Ambrose Finland, MD   5 mg at 02/23/16 2209  . feeding supplement (ENSURE ENLIVE) (ENSURE ENLIVE) liquid 237 mL  237 mL Oral BID BM Maryann Mikhail, DO   237 mL at 02/24/16 1000  . folic acid (FOLVITE) tablet 1 mg  1 mg Oral Daily Waldemar Dickens, MD   1 mg at 02/24/16 1026  . heparin injection 5,000 Units  5,000 Units Subcutaneous Q8H Waldemar Dickens, MD   5,000 Units at 02/24/16 0559  . lactulose (CHRONULAC) 10 GM/15ML solution 30 g  30 g Oral TID Waldemar Dickens, MD   30 g at 02/24/16 1000  . LORazepam (ATIVAN) tablet 1 mg  1 mg Oral Q4H PRN Maryann Mikhail, DO        Or  . LORazepam (ATIVAN) injection 1 mg  1 mg Intravenous Q4H PRN Maryann Mikhail, DO   1 mg at 02/23/16 0540  . mirtazapine (REMERON) tablet 15 mg  15 mg Oral QHS Waldemar Dickens, MD   15 mg at 02/23/16 2200  . multivitamin with minerals tablet 1 tablet  1 tablet Oral Daily Waldemar Dickens, MD   1 tablet at 02/24/16 1024  . ondansetron (ZOFRAN) tablet 4 mg  4 mg Oral Q6H PRN Waldemar Dickens, MD       Or  . ondansetron [facility]) injection 4 mg  4 mg Intravenous Q6H PRN Waldemar Dickens, MD   4 mg at 02/23/16 1957  . oxyCODONE (Oxy IR/ROXICODONE) immediate release tablet 5 mg  5 mg Oral Q4H PRN Waldemar Dickens, MD      . potassium chloride SA (K-DUR,KLOR-CON) CR tablet 40 mEq  40 mEq Oral Once [employer], DO      . rifaximin Doreene Nest) tablet 550 mg  550 mg Oral BID Maryann Mikhail, DO   550 mg at 02/24/16 1024  . sodium chloride flush (NS) 0.9 % injection 3 mL  3 mL Intravenous Q12H Waldemar Dickens, MD   3 mL at 02/24/16 1000  . thiamine (VITAMIN B-1) tablet 100 mg  100 mg Oral Daily Waldemar Dickens, MD   100 mg at 02/24/16 1024    Musculoskeletal: Strength &  Muscle Tone: decreased Gait & Station: normal Patient leans: N/A  Psychiatric Specialty Exam: Physical Exam As per history and physical  ROS Complaining about auditory and visual hallucinations, dreams of people laughing at her and  sleep disturbance  Blood pressure 100/73, pulse 79, temperature 97 F (36.1 C), temperature source Oral, resp. rate (!) 23, height _0  (1.499 m), weight 46.3 kg (102 lb), SpO2 93 %.Body mass index is 20.6 kg/m.  General Appearance: Casual  Eye Contact:  Good  Speech:  Clear and Coherent and Slow  Volume:  Normal  Mood:  Anxious  Affect:  Appropriate and Congruent  Thought Process:  Coherent and Goal Directed  Orientation:  Full (Time, Place, and Person)  Thought Content:  Logical  Suicidal Thoughts:  No  Homicidal Thoughts:  No  Memory:  Immediate;   Good Recent;   Fair Remote;   Fair   Judgement:  Impaired  Insight:  Fair  Psychomotor Activity:  Decreased  Concentration:  Concentration: Fair and Attention Span: Fair  Recall:  [employer] of Knowledge:  Good  Language:  Good  Akathisia:  Negative  Handed:  Right  AIMS (if indicated):     Assets:  Communication Skills Desire for Improvement Financial Resources/Insurance Housing Intimacy Leisure Time Resilience Social Support Transportation  ADL's:  Impaired  Cognition:  WNL  Sleep:        Treatment Plan Summary: 65 years old female presented with hepatic encephalopathy and required lactulose therapy. Patient is reporting auditory/visual hallucinations, bad dreams, Poor appetite and insomnia. Patient denies previous history of psychiatric treatment or alcoholRehabilitation treatment. Patient denies active suicidal/ homicidal ideation, intention or plans.   Continue Abilify 5 mg at bedtime for hallucinations and also insomnia Continue Remeron 15 mg at bedtime for poor appetite and insomnia Daily contact with patient to assess and evaluate symptoms and progress in treatment and Medication management   Appreciate psychiatric consultation and we sign off at this time  Please contact [phone] or [phone] if needs further assistance  Disposition: Patient does not meet criteria for psychiatric inpatient admission. Supportive therapy provided about ongoing stressors.  Ambrose Finland, MD 02/24/2016 10:42 AM

## 2016-02-24 NOTE — Progress Notes (Signed)
PROGRESS NOTE    Kathryn [name]  ZOX:[id number] DOB: 11-04-1950 DOA: 02/18/2016 PCP: Kathryn Carney, MD   Chief Complaint  Patient presents with  . Altered Mental Status    Brief Narrative:  65 year old female with history of alcoholic cirrhosis, GERD, hypertension, coronary artery disease, presented with visual hallucinations and confusion. Patient started on lactulose however continued to have altered mental status. Spoke with GI via phone, have added on rifaximin. Patient continued to have hallucinations therefore consulted psychiatry.  Assessment & Plan   Hepatic encephalopathy/Hallucinations -Mental status appears to be at baseline today -Ammonia level 76 upon admission. Presented with visual hallucinations and confusion. -Patient does have history of alcoholic cirrhosis -CT head unremarkable -Chest x-ray and UTI unremarkable -Continue lactulose -Patient does follow-up with Kathryn [name], gastroenterologist. -Patient has not been taking lactulose at home.  -Ammonia level this morning 33 -Spoke with Kathryn [name] on 11/3.  Patient has not had symptoms of hepatic encephalopathy in the past. Agreed with lactulose at this time -Continue CIWA - has been receiving several doses of Ativan -Currently in restraints -GI consulted and appreciated- Spoke Dr. Madilyn [name]. Agreed with trying rifaximin -Despite lactulose, patient continues to have encephalopathy- currently no other source.  Abdominal US pending.  -Psychiatry consulted and appreciated:started abilify 5mg  QHS, Remeron 15mg  QHS  Hypotension -Likely secondary to dehydration and continued diuretic use, GI losses -BP better today, continues to be low normal -Home medications, Aldactone, Lasix, propranolol held -Continue IV fluid  Acute on chronic kidney disease, stage III -Resolved, Likely secondary to dehydration -Creatinine 2.51 upon admission, currently 0.73  Alcoholic cirrhosis -No evidence of SBP -Propranolol, Aldactone, Lasix  currently held due to hypotension -Abdominal US: no significant ascitets  History of alcohol abuse -Patient denies any recent alcohol use- however reports given to RN states she had a "drink" 2 weeks ago, partner also eluded to continued alcohol use -Continue CIWA protocol  Chronic pain -Patient on Dilaudid 2 mg at home although her UDS is negative for opiates -Continue pain control Neurontin as needed  Gout -Current stable  Depression -Continue Remeron  Chronic anemia -MCV shows normocytic to macrocytic -Baseline hemoglobin between 9-10 -Currently 8.2, likely dilutional- as patient has been receiving IVF -IVF discontinued -Continue to monitor CBC  DVT Prophylaxis  heparin  Code Status: Full  Family Communication: None at bedside  Disposition Plan: Admitted. Transfer to medical floor. Possibly discharge to home in 24-48hrs.  Consultants Gastroenterology, via phone Psychiatry   Procedures  Abdominal US  Antibiotics   Anti-infectives    Start     Dose/Rate Route Frequency Ordered Stop   02/22/16 1000  rifaximin (XIFAXAN) tablet 550 mg     550 mg Oral 2 times daily 02/22/16 0951        Subjective:   Kathryn [name] seen and examined today. Patient feeling better this morning. Feels she cannot recall all the events that occurred in the last few days. Denies chest pain, shortness of breath, abdominal pain, N/V/C/D.  Objective:   Vitals:   02/23/16 1907 02/23/16 2327 02/24/16 0239 02/24/16 0700  BP: 133/89 108/76 108/74 100/73  Pulse: 93 82 71 79  Resp: 18 (!) 28 (!) 23   Temp: 98.5 F (36.9 C) 98.1 F (36.7 C) 98.3 F (36.8 C) 97 F (36.1 C)  TempSrc: Oral Oral Oral Oral  SpO2: 96% 96% 95% 93%  Weight:      Height:        Intake/Output Summary (Last 24 hours) at 02/24/16 1116 Last data filed  at 02/24/16 0700  Gross per 24 hour  Intake             2360 ml  Output              100 ml  Net             2260 ml   Filed Weights   02/18/16 1150  Weight:  46.3 kg (102 lb)    Exam  General: Well developed, well nourished, NAD  HEENT: NCAT, mucous membranes moist.   Cardiovascular: S1 S2 auscultated, 2/6SEM, RRR  Respiratory: Clear to auscultation bilaterally   Abdomen: Soft, nontender, nondistended, + bowel sounds  Extremities: warm dry without cyanosis clubbing or edema. Left foot transmetatarsal amputation  Neuro: AAOx3, nonfocal  Psych: Appropriate mood and affect, pleasant  Data Reviewed: I have personally reviewed following labs and imaging studies  CBC:  Recent Labs Lab 02/18/16 1158  02/20/16 0337 02/21/16 0521 02/22/16 0404 02/23/16 0314 02/24/16 0321  WBC 7.7  < > 3.2* 4.2 3.7* 4.3 4.0  NEUTROABS 5.4  --   --   --   --   --   --   HGB 11.3*  < > 8.8* 8.3* 8.1* 8.9* 8.2*  HCT 35.2*  < > 28.3* 26.5* 26.0* 29.1* 26.2*  MCV 104.5*  < > 108.4* 107.7* 108.8* 108.6* 107.4*  PLT 209  < > 146* 167 174 227 200  < > = values in this interval not displayed. Basic Metabolic Panel:  Recent Labs Lab 02/18/16 2059  02/20/16 0337 02/21/16 0521 02/22/16 0404 02/23/16 0314 02/24/16 0321  NA  --   < > 141 140 145 149* 146*  K  --   < > 4.0 3.4* 4.0 3.5 3.1*  CL  --   < > 116* 119* 123* 121* 120*  CO2  --   < > 18* 14* 18* 19* 21*  GLUCOSE  --   < > 112* 73 86 69 92  BUN  --   < > 12 7 <5* <5* <5*  CREATININE  --   < > 1.09* 0.79 0.75 0.70 0.73  CALCIUM  --   < > 8.1* 8.2* 8.4* 8.7* 8.1*  MG 1.0*  --   --   --   --   --   --   PHOS 3.5  --   --   --   --   --   --   < > = values in this interval not displayed. GFR: Estimated Creatinine Clearance: 49.1 mL/min (by C-G formula based on SCr of 0.73 mg/dL). Liver Function Tests:  Recent Labs Lab 02/18/16 1158 02/19/16 0531 02/20/16 0337 02/22/16 0404  AST 36 33 30 34  ALT 12* 9* 10* 10*  ALKPHOS 151* 117 118 108  BILITOT 1.2 0.5 0.7 0.5  PROT 7.3 5.3* 5.5* 5.0*  ALBUMIN 3.2* 2.2* 2.2* 2.0*   No results for input(s): LIPASE, AMYLASE in the last 168  hours.  Recent Labs Lab 02/20/16 0337 02/21/16 0521 02/22/16 0404 02/23/16 0314 02/24/16 0321  AMMONIA 45* 44* 33 58* 33   Coagulation Profile: No results for input(s): INR, PROTIME in the last 168 hours. Cardiac Enzymes: No results for input(s): CKTOTAL, CKMB, CKMBINDEX, TROPONINI in the last 168 hours. BNP (last 3 results) No results for input(s): PROBNP in the last 8760 hours. HbA1C: No results for input(s): HGBA1C in the last 72 hours. CBG: No results for input(s): GLUCAP in the last 168 hours. Lipid Profile: No results  for input(s): CHOL, HDL, LDLCALC, TRIG, CHOLHDL, LDLDIRECT in the last 72 hours. Thyroid Function Tests: No results for input(s): TSH, T4TOTAL, FREET4, T3FREE, THYROIDAB in the last 72 hours. Anemia Panel: No results for input(s): VITAMINB12, FOLATE, FERRITIN, TIBC, IRON, RETICCTPCT in the last 72 hours. Urine analysis:    Component Value Date/Time   COLORURINE YELLOW 02/18/2016 1224   APPEARANCEUR CLOUDY (A) 02/18/2016 1224   LABSPEC 1.008 02/18/2016 1224   PHURINE 5.5 02/18/2016 1224   GLUCOSEU NEGATIVE 02/18/2016 1224   GLUCOSEU NEG mg/dL 16/10/960411/30/2009 [id number]   HGBUR NEGATIVE 02/18/2016 1224   HGBUR negative 03/17/2008 1240   BILIRUBINUR NEGATIVE 02/18/2016 1224   KETONESUR NEGATIVE 02/18/2016 1224   PROTEINUR NEGATIVE 02/18/2016 1224   UROBILINOGEN 0.2 12/30/2013 1251   NITRITE NEGATIVE 02/18/2016 1224   LEUKOCYTESUR SMALL (A) 02/18/2016 1224   Sepsis Labs: @LABRCNTIP (procalcitonin:4,lacticidven:4)  ) Recent Results (from the past 240 hour(s))  Culture, Urine     Status: Abnormal   Collection Time: 02/18/16 12:24 PM  Result Value Ref Range Status   Specimen Description URINE, RANDOM  Final   Special Requests NONE  Final   Culture MULTIPLE SPECIES PRESENT, SUGGEST RECOLLECTION (A)  Final   Report Status 02/20/2016 FINAL  Final  MRSA PCR Screening     Status: None   Collection Time: 02/18/16  8:31 PM  Result Value Ref Range Status   MRSA by  PCR NEGATIVE NEGATIVE Final    Comment:        The GeneXpert MRSA Assay (FDA approved for NASAL specimens only), is one component of a comprehensive MRSA colonization surveillance program. It is not intended to diagnose MRSA infection nor to guide or monitor treatment for MRSA infections.   Culture, blood (routine x 2)     Status: None   Collection Time: 02/18/16  8:59 PM  Result Value Ref Range Status   Specimen Description BLOOD RIGHT ANTECUBITAL  Final   Special Requests BOTTLES DRAWN AEROBIC AND ANAEROBIC 10CC  Final   Culture NO GROWTH 5 DAYS  Final   Report Status 02/23/2016 FINAL  Final  Culture, blood (routine x 2)     Status: None   Collection Time: 02/18/16  9:06 PM  Result Value Ref Range Status   Specimen Description BLOOD LEFT ANTECUBITAL  Final   Special Requests BOTTLES DRAWN AEROBIC AND ANAEROBIC 5CC  Final   Culture NO GROWTH 5 DAYS  Final   Report Status 02/23/2016 FINAL  Final      Radiology Studies: [place] Abdomen Limited  Result Date: 02/22/2016 CLINICAL DATA:  Ascites EXAM: US ABDOMEN LIMITED - ascites COMPARISON:  MRI abdomen 09/30/2015 FINDINGS: Ultrasound evaluation of the 4 quadrants of the abdomen demonstrate no free fluid. IMPRESSION: No significant ascites. Electronically Signed   By: Genevive BiStewart  Edmunds M.D.   On: 02/22/2016 11:31     Scheduled Meds: . ARIPiprazole  5 mg Oral QHS  . feeding supplement (ENSURE ENLIVE)  237 mL Oral BID BM  . folic acid  1 mg Oral Daily  . heparin  5,000 Units Subcutaneous Q8H  . lactulose  30 g Oral TID  . mirtazapine  15 mg Oral QHS  . multivitamin with minerals  1 tablet Oral Daily  . potassium chloride  40 mEq Oral Once  . rifaximin  550 mg Oral BID  . sodium chloride flush  3 mL Intravenous Q12H  . thiamine  100 mg Oral Daily   Continuous Infusions: . sodium chloride 100 mL/hr at 02/24/16 [phone]  LOS: 6 days   Time Spent in minutes   30 minutes  Tirza Senteno D.O. on 02/24/2016 at 11:16  AM  Between 7am to 7pm - Pager - [phone]  After 7pm go to [email or web address] - password [email or web address]  And look for the night coverage person covering for me after hours  [facility] Office  [phone]

## 2016-02-25 ENCOUNTER — Encounter: Payer: Self-pay | Admitting: *Deleted

## 2016-02-25 DIAGNOSIS — K7290 Hepatic failure, unspecified without coma: Secondary | ICD-10-CM

## 2016-02-25 DIAGNOSIS — N179 Acute kidney failure, unspecified: Secondary | ICD-10-CM

## 2016-02-25 DIAGNOSIS — I952 Hypotension due to drugs: Secondary | ICD-10-CM

## 2016-02-25 DIAGNOSIS — K709 Alcoholic liver disease, unspecified: Secondary | ICD-10-CM

## 2016-02-25 DIAGNOSIS — R441 Visual hallucinations: Secondary | ICD-10-CM

## 2016-02-25 LAB — COMPREHENSIVE METABOLIC PANEL
ALBUMIN: 2 g/dL — AB (ref 3.5–5.0)
ALK PHOS: 100 U/L (ref 38–126)
ALT: 10 U/L — AB (ref 14–54)
AST: 27 U/L (ref 15–41)
Anion gap: 3 — ABNORMAL LOW (ref 5–15)
BUN: <5 mg/dL — ABNORMAL LOW (ref 6–20)
CALCIUM: 7.6 mg/dL — AB (ref 8.9–10.3)
CO2: 21 mmol/L — AB (ref 22–32)
CREATININE: 0.7 mg/dL (ref 0.44–1.00)
Chloride: 118 mmol/L — ABNORMAL HIGH (ref 101–111)
GFR calc Af Amer: >60 mL/min (ref >60)
GFR calc non Af Amer: >60 mL/min (ref >60)
GLUCOSE: 85 mg/dL (ref 65–99)
Potassium: 3.2 mmol/L — ABNORMAL LOW (ref 3.5–5.1)
SODIUM: 142 mmol/L (ref 135–145)
Total Bilirubin: 0.7 mg/dL (ref 0.3–1.2)
Total Protein: 4.7 g/dL — ABNORMAL LOW (ref 6.5–8.1)

## 2016-02-25 LAB — CBC
HCT: 26.7 % — ABNORMAL LOW (ref 36.0–46.0)
HEMOGLOBIN: 8.3 g/dL — AB (ref 12.0–15.0)
MCH: 33.1 pg (ref 26.0–34.0)
MCHC: 31.1 g/dL (ref 30.0–36.0)
MCV: 106.4 fL — ABNORMAL HIGH (ref 78.0–100.0)
Platelets: 192 10*3/uL (ref 150–400)
RBC: 2.51 MIL/uL — AB (ref 3.87–5.11)
RDW: 14.7 % (ref 11.5–15.5)
WBC: 4.2 10*3/uL (ref 4.0–10.5)

## 2016-02-25 MED ORDER — LACTULOSE 10 GM/15ML PO SOLN: 30.0000 g | Freq: Two times a day (BID) | ORAL | 0 refills | Status: DC

## 2016-02-25 MED ORDER — ARIPIPRAZOLE 5 MG PO TABS: 5.0000 mg | ORAL_TABLET | Freq: Every day | ORAL | 0 refills | Status: DC

## 2016-02-25 MED ORDER — RIFAXIMIN 550 MG PO TABS: 550.0000 mg | ORAL_TABLET | Freq: Two times a day (BID) | ORAL | 0 refills | Status: DC

## 2016-02-25 MED ORDER — POTASSIUM CHLORIDE 20 MEQ PO PACK
40.0000 meq | PACK | Freq: Once | ORAL | Status: DC
  Filled 2016-02-25: qty 2

## 2016-02-25 NOTE — Discharge Summary (Addendum)
Physician Discharge Summary  Kathryn [name] ZOX:[id number] DOB: 05-07-1950 DOA: 02/18/2016  PCP: [name], MD  Admit date: 02/18/2016 Discharge date: 02/25/2016  Admitted From: Home Disposition:  Home  Recommendations for Outpatient Follow-up:  1. Follow up with PCP in 1- week 2. Patient was placed on lactulose twice daily 3. Rifaximin twice daily 4. Per psychiatry recommendations Abilify and mirtazapine at night. 5. It is recommended to wean her off narcotics.   Home Health: Yes  Equipment/Devices: Wheelchair    Discharge Condition: Stable  CODE STATUS: Full   Diet recommendation:  Regular  Brief/Interim Summary:   This is a 65 year old female who presented to hospital with a chief complaint of confusion, visual hallucinations ongoing for last 2 days prior to hospitalization, unable to sleep. On initial physical examination, blood pressure 88/70, heart rate 80, respiratory rate 16, oxygenation 95%. She was awake and alert, moist mucous membranes, heart S1-S2 present, lungs were clear to auscultation, abdomen soft nontender, lower extremities no edema. Neuro exam patient was nonfocal. Sodium 136, potassium 4.3, creatinine 2.51, BUN 34, calcium 8.9, liver function tests normal, ammonia 76, lactic acid 0.9. White count 7.7, hemoglobin 11.3, hematocrit 35.2, platelets 209. UA negative for infection, check x-ray negative for infiltrates. CT head with no intracranial acute changes.   The patient was admitted to the hospital with working diagnosis of hepatic encephalopathy, decompensated chronic liver failure, complicated by acute kidney injury and hypotension.   1. Hepatic encephalopathy/acute decompensation of chronic liver failure.  Patient has chronic liver failure due to cirrhosis related to alcohol abuse. Patient was placed on IV fluids, aggressive lactulose therapy. She had persistent hallucinations, required psychiatry consultation. She was placed on Abilify 5 g bedtime and Remeron 15  g at bedtime. At one point during her hospitalization she required restraints. Clinically she has improved, today she is back to her baseline, she has been off restraints for last few days. She will continue taking lactulose at home twice daily as well as rifaximin. Cautious with narcotics, patient is on hydromorphone, ideally these agents will need to be taper off slowly as an outpatient. Abdominal ultrasound with no significant ascites.  2. Hypotension. It was determined to be multifactorial, patient's diuretics were held, she received IV fluids.  By the time of discharge her blood pressures 124/81. She will resume taking her diuretics.   3. Acute kidney injury. It was determined to be prerenal, patient received IV fluids, her diuretics were held, discharge creatinine is normal down to 0.70. Potassium is 3.2. She will resume her home diuretic therapy and potassium supplements.  4. Alcohol abuse. Patient was placed on benzodiazepines as needed, no significant withdrawal was noted.  5. Anemia chronic disease. Patient hemoglobin and hematocrit remained stable no signs of acute bleeding.  6. Chronic pain syndrome. Patient will resume her Dilaudid at home it is recommended to wean off this medications considering  patient's liver disease and risk of recurrent encephalopathy.   Late entry: Patient unable to afford, rifaximin, patient will take lactulose for now.   Discharge Diagnoses:  Principal Problem:   Visual hallucinations Active Problems:   EtOH dependence (HCC)   Alcoholic liver disease (HCC)   Hepatic encephalopathy (HCC)   Hypotension   Acute renal failure (ARF) (HCC)   Chronic pain    Discharge Instructions  Discharge Instructions    Diet - low sodium heart healthy    Complete by:  As directed    Discharge instructions    Complete by:  As directed  Please follow with primary care in 7 days.   Increase activity slowly    Complete by:  As directed        Medication List     STOP taking these medications   cephALEXin 500 MG capsule Commonly known as:  KEFLEX     TAKE these medications   alendronate 70 MG tablet Commonly known as:  FOSAMAX Take 70 mg by mouth once a week. Saturday   allopurinol 100 MG tablet Commonly known as:  ZYLOPRIM Take 100 mg by mouth 2 (two) times daily.   ARIPiprazole 5 MG tablet Commonly known as:  ABILIFY Take 1 tablet (5 mg total) by mouth at bedtime.   CALCIUM 600 + D PO Take 1 tablet by mouth 2 (two) times daily.   colchicine 0.6 MG tablet Take 0.6 mg by mouth 2 (two) times daily as needed (gout).   folic acid 1 MG tablet Commonly known as:  FOLVITE Take 1 tablet (1 mg total) by mouth daily. What changed:  how much to take   furosemide 40 MG tablet Commonly known as:  LASIX Take 1 tablet (40 mg total) by mouth daily.   gabapentin 100 MG capsule Commonly known as:  NEURONTIN Take 200 mg by mouth 3 (three) times daily as needed. Nerve pain   HYDROmorphone 2 MG tablet Commonly known as:  DILAUDID Take 1 tablet (2 mg total) by mouth 2 (two) times daily as needed for severe pain.   lactulose 10 GM/15ML solution Commonly known as:  CHRONULAC Take 45 mLs (30 g total) by mouth 2 (two) times daily.   mirtazapine 15 MG tablet Commonly known as:  REMERON Take 1 tablet (15 mg total) by mouth at bedtime.   potassium chloride SA 20 MEQ tablet Commonly known as:  K-DUR,KLOR-CON Take 20 mEq by mouth daily.   propranolol 10 MG tablet Commonly known as:  INDERAL Take 1 tablet (10 mg total) by mouth 2 (two) times daily.   spironolactone 50 MG tablet Commonly known as:  ALDACTONE Take 50 mg by mouth daily.   thiamine 100 MG tablet Commonly known as:  VITAMIN B-1 Take 1 tablet (100 mg total) by mouth daily. What changed:  how much to take      Follow-up Information    [name], MD Follow up in 1 week(s).   Specialty:  Family Medicine Contact information: [address] [place] [address] [phone]          Allergies  Allergen Reactions  . Codeine Hives    Causes blisters  . Tramadol Other (See Comments)    Loss of consciousness     Consultations:  Psychiatry   GI (over the phone)   Procedures/Studies: Dg Chest 2 View  Result Date: 02/18/2016 CLINICAL DATA:  Hallucinations, altered mental status EXAM: CHEST  2 VIEW COMPARISON:  03/26/2015 FINDINGS: Cardiomediastinal silhouette is stable. Old left lower rib fractures. No infiltrate or pleural effusion. No pulmonary edema. Thoracic and lumbar spine osteopenia. Mild degenerative changes lower thoracic and lumbar spine. IMPRESSION: No active cardiopulmonary disease. Electronically Signed   By: Natasha MeadLiviu  Pop M.D.   On: 02/18/2016 13:03   Ct Head Wo Contrast  Result Date: 02/18/2016 CLINICAL DATA:  Auditory hallucinations. EXAM: CT HEAD WITHOUT CONTRAST TECHNIQUE: Contiguous axial images were obtained from the base of the skull through the vertex without intravenous contrast. COMPARISON:  09/29/2014. FINDINGS: Brain: Diffusely enlarged ventricles and subarachnoid spaces. No intracranial hemorrhage, mass lesion or CT evidence of  acute infarction. Vascular: No hyperdense vessel or unexpected calcification. Skull: Normal. Negative for fracture or focal lesion. Sinuses/Orbits: Left sphenoid sinus mucosal thickening and right maxillary sinus retention cysts. Unremarkable orbits. Other: None. IMPRESSION: 1. No acute abnormality. 2. Stable mild diffuse cerebral and cerebellar atrophy. 3. Chronic left sphenoid sinusitis with improvement. Electronically Signed   By: Beckie Salts M.D.   On: 02/18/2016 14:01   US Abdomen Limited  Result Date: 02/22/2016 CLINICAL DATA:  Ascites EXAM: US ABDOMEN LIMITED - ascites COMPARISON:  MRI abdomen 09/30/2015 FINDINGS: Ultrasound evaluation of the 4 quadrants of the abdomen demonstrate no free fluid. IMPRESSION: No significant ascites. Electronically Signed   By: Genevive Bi M.D.    On: 02/22/2016 11:31       Subjective: Patient feeling back to her normal, no nausea or vomiting, no dyspnea or chest pain. No hallucinations.   Discharge Exam: Vitals:   02/25/16 0227 02/25/16 0654  BP: 105/80 124/81  Pulse: 81 77  Resp: 16 20  Temp: 98.4 F (36.9 C) 98.9 F (37.2 C)   Vitals:   02/24/16 1904 02/24/16 2252 02/25/16 0227 02/25/16 0654  BP: (!) 131/92 132/89 105/80 124/81  Pulse: 94  81 77  Resp: 19  16 20   Temp: 98.5 F (36.9 C) 99.3 F (37.4 C) 98.4 F (36.9 C) 98.9 F (37.2 C)  TempSrc: Oral Oral Oral Oral  SpO2: 98%  98% 99%  Weight:      Height:        General: Pt is alert, awake, not in acute distress, no hallucinations Cardiovascular: RRR, S1/S2 +, no rubs, no gallops Respiratory: CTA bilaterally, no wheezing, no rhonchi, no ascitis Abdominal: Soft, NT, ND, bowel sounds + Extremities: no edema, no cyanosis    The results of significant diagnostics from this hospitalization (including imaging, microbiology, ancillary and laboratory) are listed below for reference.     Microbiology: Recent Results (from the past 240 hour(s))  Culture, Urine     Status: Abnormal   Collection Time: 02/18/16 12:24 PM  Result Value Ref Range Status   Specimen Description URINE, RANDOM  Final   Special Requests NONE  Final   Culture MULTIPLE SPECIES PRESENT, SUGGEST RECOLLECTION (A)  Final   Report Status 02/20/2016 FINAL  Final  MRSA PCR Screening     Status: None   Collection Time: 02/18/16  8:31 PM  Result Value Ref Range Status   MRSA by PCR NEGATIVE NEGATIVE Final    Comment:        The GeneXpert MRSA Assay (FDA approved for NASAL specimens only), is one component of a comprehensive MRSA colonization surveillance program. It is not intended to diagnose MRSA infection nor to guide or monitor treatment for MRSA infections.   Culture, blood (routine x 2)     Status: None   Collection Time: 02/18/16  8:59 PM  Result Value Ref Range Status    Specimen Description BLOOD RIGHT ANTECUBITAL  Final   Special Requests BOTTLES DRAWN AEROBIC AND ANAEROBIC 10CC  Final   Culture NO GROWTH 5 DAYS  Final   Report Status 02/23/2016 FINAL  Final  Culture, blood (routine x 2)     Status: None   Collection Time: 02/18/16  9:06 PM  Result Value Ref Range Status   Specimen Description BLOOD LEFT ANTECUBITAL  Final   Special Requests BOTTLES DRAWN AEROBIC AND ANAEROBIC 5CC  Final   Culture NO GROWTH 5 DAYS  Final   Report Status 02/23/2016 FINAL  Final  Labs: BNP (last 3 results) No results for input(s): BNP in the last 8760 hours. Basic Metabolic Panel:  Recent Labs Lab 02/18/16 2059  02/21/16 0521 02/22/16 0404 02/23/16 0314 02/24/16 0321 02/25/16 0531  NA  --   < > 140 145 149* 146* 142  K  --   < > 3.4* 4.0 3.5 3.1* 3.2*  CL  --   < > 119* 123* 121* 120* 118*  CO2  --   < > 14* 18* 19* 21* 21*  GLUCOSE  --   < > 73 86 69 92 85  BUN  --   < > 7 <5* <5* <5* <5*  CREATININE  --   < > 0.79 0.75 0.70 0.73 0.70  CALCIUM  --   < > 8.2* 8.4* 8.7* 8.1* 7.6*  MG 1.0*  --   --   --   --   --   --   PHOS 3.5  --   --   --   --   --   --   < > = values in this interval not displayed. Liver Function Tests:  Recent Labs Lab 02/18/16 1158 02/19/16 0531 02/20/16 0337 02/22/16 0404 02/25/16 0531  AST 36 33 30 34 27  ALT 12* 9* 10* 10* 10*  ALKPHOS 151* 117 118 108 100  BILITOT 1.2 0.5 0.7 0.5 0.7  PROT 7.3 5.3* 5.5* 5.0* 4.7*  ALBUMIN 3.2* 2.2* 2.2* 2.0* 2.0*    Recent Labs Lab 02/24/16 0321  LIPASE 42    Recent Labs Lab 02/20/16 0337 02/21/16 0521 02/22/16 0404 02/23/16 0314 02/24/16 0321  AMMONIA 45* 44* 33 58* 33   CBC:  Recent Labs Lab 02/18/16 1158  02/21/16 0521 02/22/16 0404 02/23/16 0314 02/24/16 0321 02/25/16 0531  WBC 7.7  < > 4.2 3.7* 4.3 4.0 4.2  NEUTROABS 5.4  --   --   --   --   --   --   HGB 11.3*  < > 8.3* 8.1* 8.9* 8.2* 8.3*  HCT 35.2*  < > 26.5* 26.0* 29.1* 26.2* 26.7*  MCV 104.5*  <  > 107.7* 108.8* 108.6* 107.4* 106.4*  PLT 209  < > 167 174 227 200 192  < > = values in this interval not displayed. Cardiac Enzymes: No results for input(s): CKTOTAL, CKMB, CKMBINDEX, TROPONINI in the last 168 hours. BNP: Invalid input(s): POCBNP CBG: No results for input(s): GLUCAP in the last 168 hours. D-Dimer No results for input(s): DDIMER in the last 72 hours. Hgb A1c No results for input(s): HGBA1C in the last 72 hours. Lipid Profile No results for input(s): CHOL, HDL, LDLCALC, TRIG, CHOLHDL, LDLDIRECT in the last 72 hours. Thyroid function studies No results for input(s): TSH, T4TOTAL, T3FREE, THYROIDAB in the last 72 hours.  Invalid input(s): FREET3 Anemia work up No results for input(s): VITAMINB12, FOLATE, FERRITIN, TIBC, IRON, RETICCTPCT in the last 72 hours. Urinalysis    Component Value Date/Time   COLORURINE YELLOW 02/18/2016 1224   APPEARANCEUR CLOUDY (A) 02/18/2016 1224   LABSPEC 1.008 02/18/2016 1224   PHURINE 5.5 02/18/2016 1224   GLUCOSEU NEGATIVE 02/18/2016 1224   GLUCOSEU NEG mg/dL 16/01/9603 5409   HGBUR NEGATIVE 02/18/2016 1224   HGBUR negative 03/17/2008 1240   BILIRUBINUR NEGATIVE 02/18/2016 1224   KETONESUR NEGATIVE 02/18/2016 1224   PROTEINUR NEGATIVE 02/18/2016 1224   UROBILINOGEN 0.2 12/30/2013 1251   NITRITE NEGATIVE 02/18/2016 1224   LEUKOCYTESUR SMALL (A) 02/18/2016 1224   Sepsis Labs Invalid input(s): PROCALCITONIN,  WBC,  LACTICIDVEN Microbiology Recent Results (from the past 240 hour(s))  Culture, Urine     Status: Abnormal   Collection Time: 02/18/16 12:24 PM  Result Value Ref Range Status   Specimen Description URINE, RANDOM  Final   Special Requests NONE  Final   Culture MULTIPLE SPECIES PRESENT, SUGGEST RECOLLECTION (A)  Final   Report Status 02/20/2016 FINAL  Final  MRSA PCR Screening     Status: None   Collection Time: 02/18/16  8:31 PM  Result Value Ref Range Status   MRSA by PCR NEGATIVE NEGATIVE Final    Comment:         The GeneXpert MRSA Assay (FDA approved for NASAL specimens only), is one component of a comprehensive MRSA colonization surveillance program. It is not intended to diagnose MRSA infection nor to guide or monitor treatment for MRSA infections.   Culture, blood (routine x 2)     Status: None   Collection Time: 02/18/16  8:59 PM  Result Value Ref Range Status   Specimen Description BLOOD RIGHT ANTECUBITAL  Final   Special Requests BOTTLES DRAWN AEROBIC AND ANAEROBIC 10CC  Final   Culture NO GROWTH 5 DAYS  Final   Report Status 02/23/2016 FINAL  Final  Culture, blood (routine x 2)     Status: None   Collection Time: 02/18/16  9:06 PM  Result Value Ref Range Status   Specimen Description BLOOD LEFT ANTECUBITAL  Final   Special Requests BOTTLES DRAWN AEROBIC AND ANAEROBIC 5CC  Final   Culture NO GROWTH 5 DAYS  Final   Report Status 02/23/2016 FINAL  Final     Time coordinating discharge: 45 minutes  SIGNED:   Coralie Keens, MD  [facility] 02/25/2016, 8:56 AM Pager   If 7PM-7AM, please contact night-coverage [email or web address] Password [email or web address]

## 2016-02-25 NOTE — Care Management Note (Addendum)
Case Management Note  Patient Details  Name: Kathryn [name] MRN: [id number] Date of Birth: [birth year]  Subjective/Objective:    Presents with hepatic encephalopathy , hypotension, acute kidney injury, alcohol abuse, She lives with a friend, who takes her where ever she needs to go transportation wise.  She states she has [facility] in the past and would like to [facility] and [facility].  [facility]  Patient is for dc today.   Patient [place] her [place] [facility] company fax number to set this up is [phone]. Informed MD that patient will need to get at least a weeks worth of meds filled at a pharmacy he is printing scripts off to unit to give to patient.      Also   Benefit check for xifaxan           [facility] # [phone]   RIFAXIMIN ( XIFAXAN ) 550 MG BID   COVER- YES  CO-PAY- $ 564.56  TIER- 5 DRUG  PRIOR APPROVAL- YES # [id number]  PHARMACY : [facility]   * PATIENT IN THE DEDUCTIBLE PHASE*   NCM informed MD of price of xifaxan and patient could not afford he said to let her know to just take the lactulose, NCM informed Edwina BarthHannah RN and patient.   Action/Plan:   Expected Discharge Date:  02/20/16               Expected Discharge Plan:  Home w Home Health Services  In-House Referral:     Discharge planning Services  CM Consult  Post Acute Care Choice:    Choice offered to:  Patient  DME Arranged:    DME Agency:     HH Arranged:  RN, PT, Nurse's Aide HH Agency:  [facility]  Status of Service:  Completed, signed off  If discussed at [employer] Length of Stay Meetings, dates discussed:    Additional Comments:  Leone Havenaylor, Azani Brogdon Clinton, RN 02/25/2016, 10:41 AM

## 2016-02-25 NOTE — Progress Notes (Signed)
14:14: Faxed Rxs to [facility] on [address] @ [phone]. Per pt request also faxed Rxs to [facility] @ [phone]. Pt aware that she will be able to pick up her medications en route to home. No further CM needs at this time.

## 2016-02-26 DIAGNOSIS — K7030 Alcoholic cirrhosis of liver without ascites: Secondary | ICD-10-CM | POA: Diagnosis not present

## 2016-02-26 DIAGNOSIS — K7290 Hepatic failure, unspecified without coma: Secondary | ICD-10-CM | POA: Diagnosis not present

## 2016-02-26 DIAGNOSIS — F10959 Alcohol use, unspecified with alcohol-induced psychotic disorder, unspecified: Secondary | ICD-10-CM | POA: Diagnosis not present

## 2016-02-26 DIAGNOSIS — N183 Chronic kidney disease, stage 3 (moderate): Secondary | ICD-10-CM | POA: Diagnosis not present

## 2016-02-26 DIAGNOSIS — I129 Hypertensive chronic kidney disease with stage 1 through stage 4 chronic kidney disease, or unspecified chronic kidney disease: Secondary | ICD-10-CM | POA: Diagnosis not present

## 2016-02-26 DIAGNOSIS — M159 Polyosteoarthritis, unspecified: Secondary | ICD-10-CM | POA: Diagnosis not present

## 2016-03-02 DIAGNOSIS — I129 Hypertensive chronic kidney disease with stage 1 through stage 4 chronic kidney disease, or unspecified chronic kidney disease: Secondary | ICD-10-CM | POA: Diagnosis not present

## 2016-03-02 DIAGNOSIS — K7290 Hepatic failure, unspecified without coma: Secondary | ICD-10-CM | POA: Diagnosis not present

## 2016-03-02 DIAGNOSIS — K7030 Alcoholic cirrhosis of liver without ascites: Secondary | ICD-10-CM | POA: Diagnosis not present

## 2016-03-02 DIAGNOSIS — M159 Polyosteoarthritis, unspecified: Secondary | ICD-10-CM | POA: Diagnosis not present

## 2016-03-02 DIAGNOSIS — F10959 Alcohol use, unspecified with alcohol-induced psychotic disorder, unspecified: Secondary | ICD-10-CM | POA: Diagnosis not present

## 2016-03-02 DIAGNOSIS — N183 Chronic kidney disease, stage 3 (moderate): Secondary | ICD-10-CM | POA: Diagnosis not present

## 2016-03-04 DIAGNOSIS — M159 Polyosteoarthritis, unspecified: Secondary | ICD-10-CM | POA: Diagnosis not present

## 2016-03-04 DIAGNOSIS — I129 Hypertensive chronic kidney disease with stage 1 through stage 4 chronic kidney disease, or unspecified chronic kidney disease: Secondary | ICD-10-CM | POA: Diagnosis not present

## 2016-03-04 DIAGNOSIS — N183 Chronic kidney disease, stage 3 (moderate): Secondary | ICD-10-CM | POA: Diagnosis not present

## 2016-03-04 DIAGNOSIS — R109 Unspecified abdominal pain: Secondary | ICD-10-CM | POA: Diagnosis not present

## 2016-03-04 DIAGNOSIS — K7030 Alcoholic cirrhosis of liver without ascites: Secondary | ICD-10-CM | POA: Diagnosis not present

## 2016-03-04 DIAGNOSIS — K7290 Hepatic failure, unspecified without coma: Secondary | ICD-10-CM | POA: Diagnosis not present

## 2016-03-04 DIAGNOSIS — K7460 Unspecified cirrhosis of liver: Secondary | ICD-10-CM | POA: Diagnosis not present

## 2016-03-04 DIAGNOSIS — R410 Disorientation, unspecified: Secondary | ICD-10-CM | POA: Diagnosis not present

## 2016-03-04 DIAGNOSIS — F10959 Alcohol use, unspecified with alcohol-induced psychotic disorder, unspecified: Secondary | ICD-10-CM | POA: Diagnosis not present

## 2016-03-06 DIAGNOSIS — N183 Chronic kidney disease, stage 3 (moderate): Secondary | ICD-10-CM | POA: Diagnosis not present

## 2016-03-06 DIAGNOSIS — F10959 Alcohol use, unspecified with alcohol-induced psychotic disorder, unspecified: Secondary | ICD-10-CM | POA: Diagnosis not present

## 2016-03-06 DIAGNOSIS — M159 Polyosteoarthritis, unspecified: Secondary | ICD-10-CM | POA: Diagnosis not present

## 2016-03-06 DIAGNOSIS — I129 Hypertensive chronic kidney disease with stage 1 through stage 4 chronic kidney disease, or unspecified chronic kidney disease: Secondary | ICD-10-CM | POA: Diagnosis not present

## 2016-03-06 DIAGNOSIS — K7030 Alcoholic cirrhosis of liver without ascites: Secondary | ICD-10-CM | POA: Diagnosis not present

## 2016-03-06 DIAGNOSIS — K7290 Hepatic failure, unspecified without coma: Secondary | ICD-10-CM | POA: Diagnosis not present

## 2016-03-07 DIAGNOSIS — K7290 Hepatic failure, unspecified without coma: Secondary | ICD-10-CM | POA: Diagnosis not present

## 2016-03-07 DIAGNOSIS — M159 Polyosteoarthritis, unspecified: Secondary | ICD-10-CM | POA: Diagnosis not present

## 2016-03-07 DIAGNOSIS — K7030 Alcoholic cirrhosis of liver without ascites: Secondary | ICD-10-CM | POA: Diagnosis not present

## 2016-03-07 DIAGNOSIS — I129 Hypertensive chronic kidney disease with stage 1 through stage 4 chronic kidney disease, or unspecified chronic kidney disease: Secondary | ICD-10-CM | POA: Diagnosis not present

## 2016-03-07 DIAGNOSIS — F10959 Alcohol use, unspecified with alcohol-induced psychotic disorder, unspecified: Secondary | ICD-10-CM | POA: Diagnosis not present

## 2016-03-07 DIAGNOSIS — N183 Chronic kidney disease, stage 3 (moderate): Secondary | ICD-10-CM | POA: Diagnosis not present

## 2016-03-08 DIAGNOSIS — F10959 Alcohol use, unspecified with alcohol-induced psychotic disorder, unspecified: Secondary | ICD-10-CM | POA: Diagnosis not present

## 2016-03-08 DIAGNOSIS — I129 Hypertensive chronic kidney disease with stage 1 through stage 4 chronic kidney disease, or unspecified chronic kidney disease: Secondary | ICD-10-CM | POA: Diagnosis not present

## 2016-03-08 DIAGNOSIS — K7290 Hepatic failure, unspecified without coma: Secondary | ICD-10-CM | POA: Diagnosis not present

## 2016-03-08 DIAGNOSIS — N183 Chronic kidney disease, stage 3 (moderate): Secondary | ICD-10-CM | POA: Diagnosis not present

## 2016-03-08 DIAGNOSIS — M159 Polyosteoarthritis, unspecified: Secondary | ICD-10-CM | POA: Diagnosis not present

## 2016-03-08 DIAGNOSIS — K7030 Alcoholic cirrhosis of liver without ascites: Secondary | ICD-10-CM | POA: Diagnosis not present

## 2016-03-09 ENCOUNTER — Other Ambulatory Visit: Payer: Self-pay | Admitting: Gastroenterology

## 2016-03-09 DIAGNOSIS — M159 Polyosteoarthritis, unspecified: Secondary | ICD-10-CM | POA: Diagnosis not present

## 2016-03-09 DIAGNOSIS — K7290 Hepatic failure, unspecified without coma: Secondary | ICD-10-CM | POA: Diagnosis not present

## 2016-03-09 DIAGNOSIS — K7030 Alcoholic cirrhosis of liver without ascites: Secondary | ICD-10-CM | POA: Diagnosis not present

## 2016-03-09 DIAGNOSIS — I129 Hypertensive chronic kidney disease with stage 1 through stage 4 chronic kidney disease, or unspecified chronic kidney disease: Secondary | ICD-10-CM | POA: Diagnosis not present

## 2016-03-09 DIAGNOSIS — F10959 Alcohol use, unspecified with alcohol-induced psychotic disorder, unspecified: Secondary | ICD-10-CM | POA: Diagnosis not present

## 2016-03-09 DIAGNOSIS — K7460 Unspecified cirrhosis of liver: Secondary | ICD-10-CM

## 2016-03-09 DIAGNOSIS — N183 Chronic kidney disease, stage 3 (moderate): Secondary | ICD-10-CM | POA: Diagnosis not present

## 2016-03-09 DIAGNOSIS — R188 Other ascites: Principal | ICD-10-CM

## 2016-03-11 DIAGNOSIS — I129 Hypertensive chronic kidney disease with stage 1 through stage 4 chronic kidney disease, or unspecified chronic kidney disease: Secondary | ICD-10-CM | POA: Diagnosis not present

## 2016-03-11 DIAGNOSIS — M159 Polyosteoarthritis, unspecified: Secondary | ICD-10-CM | POA: Diagnosis not present

## 2016-03-11 DIAGNOSIS — F10959 Alcohol use, unspecified with alcohol-induced psychotic disorder, unspecified: Secondary | ICD-10-CM | POA: Diagnosis not present

## 2016-03-11 DIAGNOSIS — K7290 Hepatic failure, unspecified without coma: Secondary | ICD-10-CM | POA: Diagnosis not present

## 2016-03-11 DIAGNOSIS — K7030 Alcoholic cirrhosis of liver without ascites: Secondary | ICD-10-CM | POA: Diagnosis not present

## 2016-03-11 DIAGNOSIS — N183 Chronic kidney disease, stage 3 (moderate): Secondary | ICD-10-CM | POA: Diagnosis not present

## 2016-03-14 DIAGNOSIS — K7290 Hepatic failure, unspecified without coma: Secondary | ICD-10-CM | POA: Diagnosis not present

## 2016-03-14 DIAGNOSIS — F10959 Alcohol use, unspecified with alcohol-induced psychotic disorder, unspecified: Secondary | ICD-10-CM | POA: Diagnosis not present

## 2016-03-14 DIAGNOSIS — N183 Chronic kidney disease, stage 3 (moderate): Secondary | ICD-10-CM | POA: Diagnosis not present

## 2016-03-14 DIAGNOSIS — I129 Hypertensive chronic kidney disease with stage 1 through stage 4 chronic kidney disease, or unspecified chronic kidney disease: Secondary | ICD-10-CM | POA: Diagnosis not present

## 2016-03-14 DIAGNOSIS — K7030 Alcoholic cirrhosis of liver without ascites: Secondary | ICD-10-CM | POA: Diagnosis not present

## 2016-03-14 DIAGNOSIS — M159 Polyosteoarthritis, unspecified: Secondary | ICD-10-CM | POA: Diagnosis not present

## 2016-03-15 DIAGNOSIS — M159 Polyosteoarthritis, unspecified: Secondary | ICD-10-CM | POA: Diagnosis not present

## 2016-03-15 DIAGNOSIS — N183 Chronic kidney disease, stage 3 (moderate): Secondary | ICD-10-CM | POA: Diagnosis not present

## 2016-03-15 DIAGNOSIS — I129 Hypertensive chronic kidney disease with stage 1 through stage 4 chronic kidney disease, or unspecified chronic kidney disease: Secondary | ICD-10-CM | POA: Diagnosis not present

## 2016-03-15 DIAGNOSIS — K7290 Hepatic failure, unspecified without coma: Secondary | ICD-10-CM | POA: Diagnosis not present

## 2016-03-15 DIAGNOSIS — K7030 Alcoholic cirrhosis of liver without ascites: Secondary | ICD-10-CM | POA: Diagnosis not present

## 2016-03-15 DIAGNOSIS — F10959 Alcohol use, unspecified with alcohol-induced psychotic disorder, unspecified: Secondary | ICD-10-CM | POA: Diagnosis not present

## 2016-03-16 DIAGNOSIS — F10959 Alcohol use, unspecified with alcohol-induced psychotic disorder, unspecified: Secondary | ICD-10-CM | POA: Diagnosis not present

## 2016-03-16 DIAGNOSIS — N183 Chronic kidney disease, stage 3 (moderate): Secondary | ICD-10-CM | POA: Diagnosis not present

## 2016-03-16 DIAGNOSIS — K7290 Hepatic failure, unspecified without coma: Secondary | ICD-10-CM | POA: Diagnosis not present

## 2016-03-16 DIAGNOSIS — K7030 Alcoholic cirrhosis of liver without ascites: Secondary | ICD-10-CM | POA: Diagnosis not present

## 2016-03-16 DIAGNOSIS — M159 Polyosteoarthritis, unspecified: Secondary | ICD-10-CM | POA: Diagnosis not present

## 2016-03-16 DIAGNOSIS — I129 Hypertensive chronic kidney disease with stage 1 through stage 4 chronic kidney disease, or unspecified chronic kidney disease: Secondary | ICD-10-CM | POA: Diagnosis not present

## 2016-03-17 DIAGNOSIS — F10959 Alcohol use, unspecified with alcohol-induced psychotic disorder, unspecified: Secondary | ICD-10-CM | POA: Diagnosis not present

## 2016-03-17 DIAGNOSIS — K7030 Alcoholic cirrhosis of liver without ascites: Secondary | ICD-10-CM | POA: Diagnosis not present

## 2016-03-17 DIAGNOSIS — M159 Polyosteoarthritis, unspecified: Secondary | ICD-10-CM | POA: Diagnosis not present

## 2016-03-17 DIAGNOSIS — I129 Hypertensive chronic kidney disease with stage 1 through stage 4 chronic kidney disease, or unspecified chronic kidney disease: Secondary | ICD-10-CM | POA: Diagnosis not present

## 2016-03-17 DIAGNOSIS — K7290 Hepatic failure, unspecified without coma: Secondary | ICD-10-CM | POA: Diagnosis not present

## 2016-03-17 DIAGNOSIS — N183 Chronic kidney disease, stage 3 (moderate): Secondary | ICD-10-CM | POA: Diagnosis not present

## 2016-03-21 ENCOUNTER — Ambulatory Visit
Admission: RE | Admit: 2016-03-21 | Discharge: 2016-03-21 | Disposition: A | Discharge status: Home / Self Care | Payer: Commercial Managed Care - HMO | Source: Ambulatory Visit | Attending: Gastroenterology | Admitting: Gastroenterology

## 2016-03-21 DIAGNOSIS — K7290 Hepatic failure, unspecified without coma: Secondary | ICD-10-CM | POA: Diagnosis not present

## 2016-03-21 DIAGNOSIS — N183 Chronic kidney disease, stage 3 (moderate): Secondary | ICD-10-CM | POA: Diagnosis not present

## 2016-03-21 DIAGNOSIS — R188 Other ascites: Principal | ICD-10-CM

## 2016-03-21 DIAGNOSIS — K7030 Alcoholic cirrhosis of liver without ascites: Secondary | ICD-10-CM | POA: Diagnosis not present

## 2016-03-21 DIAGNOSIS — F10959 Alcohol use, unspecified with alcohol-induced psychotic disorder, unspecified: Secondary | ICD-10-CM | POA: Diagnosis not present

## 2016-03-21 DIAGNOSIS — M159 Polyosteoarthritis, unspecified: Secondary | ICD-10-CM | POA: Diagnosis not present

## 2016-03-21 DIAGNOSIS — I129 Hypertensive chronic kidney disease with stage 1 through stage 4 chronic kidney disease, or unspecified chronic kidney disease: Secondary | ICD-10-CM | POA: Diagnosis not present

## 2016-03-21 DIAGNOSIS — K7460 Unspecified cirrhosis of liver: Secondary | ICD-10-CM | POA: Diagnosis not present

## 2016-03-22 DIAGNOSIS — K7030 Alcoholic cirrhosis of liver without ascites: Secondary | ICD-10-CM | POA: Diagnosis not present

## 2016-03-22 DIAGNOSIS — N183 Chronic kidney disease, stage 3 (moderate): Secondary | ICD-10-CM | POA: Diagnosis not present

## 2016-03-22 DIAGNOSIS — F10959 Alcohol use, unspecified with alcohol-induced psychotic disorder, unspecified: Secondary | ICD-10-CM | POA: Diagnosis not present

## 2016-03-22 DIAGNOSIS — K7290 Hepatic failure, unspecified without coma: Secondary | ICD-10-CM | POA: Diagnosis not present

## 2016-03-22 DIAGNOSIS — M159 Polyosteoarthritis, unspecified: Secondary | ICD-10-CM | POA: Diagnosis not present

## 2016-03-22 DIAGNOSIS — I129 Hypertensive chronic kidney disease with stage 1 through stage 4 chronic kidney disease, or unspecified chronic kidney disease: Secondary | ICD-10-CM | POA: Diagnosis not present

## 2016-03-23 ENCOUNTER — Other Ambulatory Visit (HOSPITAL_COMMUNITY): Payer: Self-pay | Admitting: Gastroenterology

## 2016-03-23 DIAGNOSIS — K7290 Hepatic failure, unspecified without coma: Secondary | ICD-10-CM | POA: Diagnosis not present

## 2016-03-23 DIAGNOSIS — M159 Polyosteoarthritis, unspecified: Secondary | ICD-10-CM | POA: Diagnosis not present

## 2016-03-23 DIAGNOSIS — I129 Hypertensive chronic kidney disease with stage 1 through stage 4 chronic kidney disease, or unspecified chronic kidney disease: Secondary | ICD-10-CM | POA: Diagnosis not present

## 2016-03-23 DIAGNOSIS — R935 Abnormal findings on diagnostic imaging of other abdominal regions, including retroperitoneum: Secondary | ICD-10-CM

## 2016-03-23 DIAGNOSIS — K7030 Alcoholic cirrhosis of liver without ascites: Secondary | ICD-10-CM | POA: Diagnosis not present

## 2016-03-23 DIAGNOSIS — F10959 Alcohol use, unspecified with alcohol-induced psychotic disorder, unspecified: Secondary | ICD-10-CM | POA: Diagnosis not present

## 2016-03-23 DIAGNOSIS — N183 Chronic kidney disease, stage 3 (moderate): Secondary | ICD-10-CM | POA: Diagnosis not present

## 2016-03-24 ENCOUNTER — Ambulatory Visit (HOSPITAL_COMMUNITY)
Admission: RE | Admit: 2016-03-24 | Discharge: 2016-03-24 | Disposition: A | Discharge status: Home / Self Care | Payer: Commercial Managed Care - HMO | Source: Ambulatory Visit | Attending: Gastroenterology | Admitting: Gastroenterology

## 2016-03-24 ENCOUNTER — Other Ambulatory Visit (HOSPITAL_COMMUNITY): Payer: Self-pay | Admitting: Gastroenterology

## 2016-03-24 DIAGNOSIS — R935 Abnormal findings on diagnostic imaging of other abdominal regions, including retroperitoneum: Secondary | ICD-10-CM

## 2016-03-24 DIAGNOSIS — K7460 Unspecified cirrhosis of liver: Secondary | ICD-10-CM | POA: Diagnosis not present

## 2016-03-24 MED ORDER — TECHNETIUM TC 99M MEBROFENIN IV KIT
5.0000 | PACK | Freq: Once | INTRAVENOUS | Status: AC | PRN
  Administered 2016-03-24: 5 via INTRAVENOUS

## 2016-03-25 DIAGNOSIS — K7030 Alcoholic cirrhosis of liver without ascites: Secondary | ICD-10-CM | POA: Diagnosis not present

## 2016-03-25 DIAGNOSIS — I129 Hypertensive chronic kidney disease with stage 1 through stage 4 chronic kidney disease, or unspecified chronic kidney disease: Secondary | ICD-10-CM | POA: Diagnosis not present

## 2016-03-25 DIAGNOSIS — N183 Chronic kidney disease, stage 3 (moderate): Secondary | ICD-10-CM | POA: Diagnosis not present

## 2016-03-25 DIAGNOSIS — F10959 Alcohol use, unspecified with alcohol-induced psychotic disorder, unspecified: Secondary | ICD-10-CM | POA: Diagnosis not present

## 2016-03-25 DIAGNOSIS — M159 Polyosteoarthritis, unspecified: Secondary | ICD-10-CM | POA: Diagnosis not present

## 2016-03-25 DIAGNOSIS — K7290 Hepatic failure, unspecified without coma: Secondary | ICD-10-CM | POA: Diagnosis not present

## 2016-03-28 DIAGNOSIS — F10959 Alcohol use, unspecified with alcohol-induced psychotic disorder, unspecified: Secondary | ICD-10-CM | POA: Diagnosis not present

## 2016-03-28 DIAGNOSIS — K7030 Alcoholic cirrhosis of liver without ascites: Secondary | ICD-10-CM | POA: Diagnosis not present

## 2016-03-28 DIAGNOSIS — R935 Abnormal findings on diagnostic imaging of other abdominal regions, including retroperitoneum: Secondary | ICD-10-CM | POA: Diagnosis not present

## 2016-03-28 DIAGNOSIS — I129 Hypertensive chronic kidney disease with stage 1 through stage 4 chronic kidney disease, or unspecified chronic kidney disease: Secondary | ICD-10-CM | POA: Diagnosis not present

## 2016-03-28 DIAGNOSIS — N183 Chronic kidney disease, stage 3 (moderate): Secondary | ICD-10-CM | POA: Diagnosis not present

## 2016-03-28 DIAGNOSIS — M159 Polyosteoarthritis, unspecified: Secondary | ICD-10-CM | POA: Diagnosis not present

## 2016-03-28 DIAGNOSIS — K7290 Hepatic failure, unspecified without coma: Secondary | ICD-10-CM | POA: Diagnosis not present

## 2016-03-30 DIAGNOSIS — K7030 Alcoholic cirrhosis of liver without ascites: Secondary | ICD-10-CM | POA: Diagnosis not present

## 2016-03-30 DIAGNOSIS — I129 Hypertensive chronic kidney disease with stage 1 through stage 4 chronic kidney disease, or unspecified chronic kidney disease: Secondary | ICD-10-CM | POA: Diagnosis not present

## 2016-03-30 DIAGNOSIS — K7290 Hepatic failure, unspecified without coma: Secondary | ICD-10-CM | POA: Diagnosis not present

## 2016-03-30 DIAGNOSIS — N183 Chronic kidney disease, stage 3 (moderate): Secondary | ICD-10-CM | POA: Diagnosis not present

## 2016-03-30 DIAGNOSIS — F10959 Alcohol use, unspecified with alcohol-induced psychotic disorder, unspecified: Secondary | ICD-10-CM | POA: Diagnosis not present

## 2016-03-30 DIAGNOSIS — M159 Polyosteoarthritis, unspecified: Secondary | ICD-10-CM | POA: Diagnosis not present

## 2016-03-31 DIAGNOSIS — F10959 Alcohol use, unspecified with alcohol-induced psychotic disorder, unspecified: Secondary | ICD-10-CM | POA: Diagnosis not present

## 2016-03-31 DIAGNOSIS — I129 Hypertensive chronic kidney disease with stage 1 through stage 4 chronic kidney disease, or unspecified chronic kidney disease: Secondary | ICD-10-CM | POA: Diagnosis not present

## 2016-03-31 DIAGNOSIS — K7290 Hepatic failure, unspecified without coma: Secondary | ICD-10-CM | POA: Diagnosis not present

## 2016-03-31 DIAGNOSIS — K7030 Alcoholic cirrhosis of liver without ascites: Secondary | ICD-10-CM | POA: Diagnosis not present

## 2016-03-31 DIAGNOSIS — M159 Polyosteoarthritis, unspecified: Secondary | ICD-10-CM | POA: Diagnosis not present

## 2016-03-31 DIAGNOSIS — N183 Chronic kidney disease, stage 3 (moderate): Secondary | ICD-10-CM | POA: Diagnosis not present

## 2016-04-04 DIAGNOSIS — K7290 Hepatic failure, unspecified without coma: Secondary | ICD-10-CM | POA: Diagnosis not present

## 2016-04-04 DIAGNOSIS — K7030 Alcoholic cirrhosis of liver without ascites: Secondary | ICD-10-CM | POA: Diagnosis not present

## 2016-04-04 DIAGNOSIS — N183 Chronic kidney disease, stage 3 (moderate): Secondary | ICD-10-CM | POA: Diagnosis not present

## 2016-04-04 DIAGNOSIS — M159 Polyosteoarthritis, unspecified: Secondary | ICD-10-CM | POA: Diagnosis not present

## 2016-04-04 DIAGNOSIS — F10959 Alcohol use, unspecified with alcohol-induced psychotic disorder, unspecified: Secondary | ICD-10-CM | POA: Diagnosis not present

## 2016-04-04 DIAGNOSIS — I129 Hypertensive chronic kidney disease with stage 1 through stage 4 chronic kidney disease, or unspecified chronic kidney disease: Secondary | ICD-10-CM | POA: Diagnosis not present

## 2016-04-05 DIAGNOSIS — I129 Hypertensive chronic kidney disease with stage 1 through stage 4 chronic kidney disease, or unspecified chronic kidney disease: Secondary | ICD-10-CM | POA: Diagnosis not present

## 2016-04-05 DIAGNOSIS — M159 Polyosteoarthritis, unspecified: Secondary | ICD-10-CM | POA: Diagnosis not present

## 2016-04-05 DIAGNOSIS — N183 Chronic kidney disease, stage 3 (moderate): Secondary | ICD-10-CM | POA: Diagnosis not present

## 2016-04-05 DIAGNOSIS — K7290 Hepatic failure, unspecified without coma: Secondary | ICD-10-CM | POA: Diagnosis not present

## 2016-04-05 DIAGNOSIS — F10959 Alcohol use, unspecified with alcohol-induced psychotic disorder, unspecified: Secondary | ICD-10-CM | POA: Diagnosis not present

## 2016-04-05 DIAGNOSIS — K7030 Alcoholic cirrhosis of liver without ascites: Secondary | ICD-10-CM | POA: Diagnosis not present

## 2016-04-06 DIAGNOSIS — M159 Polyosteoarthritis, unspecified: Secondary | ICD-10-CM | POA: Diagnosis not present

## 2016-04-06 DIAGNOSIS — K7290 Hepatic failure, unspecified without coma: Secondary | ICD-10-CM | POA: Diagnosis not present

## 2016-04-06 DIAGNOSIS — I129 Hypertensive chronic kidney disease with stage 1 through stage 4 chronic kidney disease, or unspecified chronic kidney disease: Secondary | ICD-10-CM | POA: Diagnosis not present

## 2016-04-06 DIAGNOSIS — F10959 Alcohol use, unspecified with alcohol-induced psychotic disorder, unspecified: Secondary | ICD-10-CM | POA: Diagnosis not present

## 2016-04-06 DIAGNOSIS — N183 Chronic kidney disease, stage 3 (moderate): Secondary | ICD-10-CM | POA: Diagnosis not present

## 2016-04-06 DIAGNOSIS — K7030 Alcoholic cirrhosis of liver without ascites: Secondary | ICD-10-CM | POA: Diagnosis not present

## 2016-04-07 DIAGNOSIS — K7030 Alcoholic cirrhosis of liver without ascites: Secondary | ICD-10-CM | POA: Diagnosis not present

## 2016-04-07 DIAGNOSIS — M159 Polyosteoarthritis, unspecified: Secondary | ICD-10-CM | POA: Diagnosis not present

## 2016-04-07 DIAGNOSIS — N183 Chronic kidney disease, stage 3 (moderate): Secondary | ICD-10-CM | POA: Diagnosis not present

## 2016-04-07 DIAGNOSIS — K7290 Hepatic failure, unspecified without coma: Secondary | ICD-10-CM | POA: Diagnosis not present

## 2016-04-07 DIAGNOSIS — I129 Hypertensive chronic kidney disease with stage 1 through stage 4 chronic kidney disease, or unspecified chronic kidney disease: Secondary | ICD-10-CM | POA: Diagnosis not present

## 2016-04-07 DIAGNOSIS — F10959 Alcohol use, unspecified with alcohol-induced psychotic disorder, unspecified: Secondary | ICD-10-CM | POA: Diagnosis not present

## 2016-04-12 DIAGNOSIS — K7290 Hepatic failure, unspecified without coma: Secondary | ICD-10-CM | POA: Diagnosis not present

## 2016-04-12 DIAGNOSIS — I129 Hypertensive chronic kidney disease with stage 1 through stage 4 chronic kidney disease, or unspecified chronic kidney disease: Secondary | ICD-10-CM | POA: Diagnosis not present

## 2016-04-12 DIAGNOSIS — F10959 Alcohol use, unspecified with alcohol-induced psychotic disorder, unspecified: Secondary | ICD-10-CM | POA: Diagnosis not present

## 2016-04-12 DIAGNOSIS — N183 Chronic kidney disease, stage 3 (moderate): Secondary | ICD-10-CM | POA: Diagnosis not present

## 2016-04-12 DIAGNOSIS — M159 Polyosteoarthritis, unspecified: Secondary | ICD-10-CM | POA: Diagnosis not present

## 2016-04-12 DIAGNOSIS — K7030 Alcoholic cirrhosis of liver without ascites: Secondary | ICD-10-CM | POA: Diagnosis not present

## 2016-04-13 DIAGNOSIS — M159 Polyosteoarthritis, unspecified: Secondary | ICD-10-CM | POA: Diagnosis not present

## 2016-04-13 DIAGNOSIS — K7030 Alcoholic cirrhosis of liver without ascites: Secondary | ICD-10-CM | POA: Diagnosis not present

## 2016-04-13 DIAGNOSIS — N183 Chronic kidney disease, stage 3 (moderate): Secondary | ICD-10-CM | POA: Diagnosis not present

## 2016-04-13 DIAGNOSIS — F10959 Alcohol use, unspecified with alcohol-induced psychotic disorder, unspecified: Secondary | ICD-10-CM | POA: Diagnosis not present

## 2016-04-13 DIAGNOSIS — I129 Hypertensive chronic kidney disease with stage 1 through stage 4 chronic kidney disease, or unspecified chronic kidney disease: Secondary | ICD-10-CM | POA: Diagnosis not present

## 2016-04-13 DIAGNOSIS — K7290 Hepatic failure, unspecified without coma: Secondary | ICD-10-CM | POA: Diagnosis not present

## 2016-04-14 DIAGNOSIS — K7030 Alcoholic cirrhosis of liver without ascites: Secondary | ICD-10-CM | POA: Diagnosis not present

## 2016-04-14 DIAGNOSIS — N183 Chronic kidney disease, stage 3 (moderate): Secondary | ICD-10-CM | POA: Diagnosis not present

## 2016-04-14 DIAGNOSIS — K7290 Hepatic failure, unspecified without coma: Secondary | ICD-10-CM | POA: Diagnosis not present

## 2016-04-14 DIAGNOSIS — M159 Polyosteoarthritis, unspecified: Secondary | ICD-10-CM | POA: Diagnosis not present

## 2016-04-14 DIAGNOSIS — I129 Hypertensive chronic kidney disease with stage 1 through stage 4 chronic kidney disease, or unspecified chronic kidney disease: Secondary | ICD-10-CM | POA: Diagnosis not present

## 2016-04-14 DIAGNOSIS — F10959 Alcohol use, unspecified with alcohol-induced psychotic disorder, unspecified: Secondary | ICD-10-CM | POA: Diagnosis not present

## 2016-04-15 DIAGNOSIS — I129 Hypertensive chronic kidney disease with stage 1 through stage 4 chronic kidney disease, or unspecified chronic kidney disease: Secondary | ICD-10-CM | POA: Diagnosis not present

## 2016-04-15 DIAGNOSIS — N183 Chronic kidney disease, stage 3 (moderate): Secondary | ICD-10-CM | POA: Diagnosis not present

## 2016-04-15 DIAGNOSIS — K7290 Hepatic failure, unspecified without coma: Secondary | ICD-10-CM | POA: Diagnosis not present

## 2016-04-15 DIAGNOSIS — K7030 Alcoholic cirrhosis of liver without ascites: Secondary | ICD-10-CM | POA: Diagnosis not present

## 2016-04-15 DIAGNOSIS — F10959 Alcohol use, unspecified with alcohol-induced psychotic disorder, unspecified: Secondary | ICD-10-CM | POA: Diagnosis not present

## 2016-04-15 DIAGNOSIS — M159 Polyosteoarthritis, unspecified: Secondary | ICD-10-CM | POA: Diagnosis not present

## 2016-04-19 DIAGNOSIS — K7290 Hepatic failure, unspecified without coma: Secondary | ICD-10-CM | POA: Diagnosis not present

## 2016-04-19 DIAGNOSIS — I129 Hypertensive chronic kidney disease with stage 1 through stage 4 chronic kidney disease, or unspecified chronic kidney disease: Secondary | ICD-10-CM | POA: Diagnosis not present

## 2016-04-19 DIAGNOSIS — K7030 Alcoholic cirrhosis of liver without ascites: Secondary | ICD-10-CM | POA: Diagnosis not present

## 2016-04-19 DIAGNOSIS — M159 Polyosteoarthritis, unspecified: Secondary | ICD-10-CM | POA: Diagnosis not present

## 2016-04-19 DIAGNOSIS — N183 Chronic kidney disease, stage 3 (moderate): Secondary | ICD-10-CM | POA: Diagnosis not present

## 2016-04-19 DIAGNOSIS — F10959 Alcohol use, unspecified with alcohol-induced psychotic disorder, unspecified: Secondary | ICD-10-CM | POA: Diagnosis not present

## 2016-04-20 DIAGNOSIS — I129 Hypertensive chronic kidney disease with stage 1 through stage 4 chronic kidney disease, or unspecified chronic kidney disease: Secondary | ICD-10-CM | POA: Diagnosis not present

## 2016-04-20 DIAGNOSIS — K7030 Alcoholic cirrhosis of liver without ascites: Secondary | ICD-10-CM | POA: Diagnosis not present

## 2016-04-20 DIAGNOSIS — M159 Polyosteoarthritis, unspecified: Secondary | ICD-10-CM | POA: Diagnosis not present

## 2016-04-20 DIAGNOSIS — K7290 Hepatic failure, unspecified without coma: Secondary | ICD-10-CM | POA: Diagnosis not present

## 2016-04-20 DIAGNOSIS — N183 Chronic kidney disease, stage 3 (moderate): Secondary | ICD-10-CM | POA: Diagnosis not present

## 2016-04-20 DIAGNOSIS — F10959 Alcohol use, unspecified with alcohol-induced psychotic disorder, unspecified: Secondary | ICD-10-CM | POA: Diagnosis not present

## 2016-04-22 DIAGNOSIS — K7290 Hepatic failure, unspecified without coma: Secondary | ICD-10-CM | POA: Diagnosis not present

## 2016-04-22 DIAGNOSIS — I129 Hypertensive chronic kidney disease with stage 1 through stage 4 chronic kidney disease, or unspecified chronic kidney disease: Secondary | ICD-10-CM | POA: Diagnosis not present

## 2016-04-22 DIAGNOSIS — F10959 Alcohol use, unspecified with alcohol-induced psychotic disorder, unspecified: Secondary | ICD-10-CM | POA: Diagnosis not present

## 2016-04-22 DIAGNOSIS — M159 Polyosteoarthritis, unspecified: Secondary | ICD-10-CM | POA: Diagnosis not present

## 2016-04-22 DIAGNOSIS — K7030 Alcoholic cirrhosis of liver without ascites: Secondary | ICD-10-CM | POA: Diagnosis not present

## 2016-04-22 DIAGNOSIS — N183 Chronic kidney disease, stage 3 (moderate): Secondary | ICD-10-CM | POA: Diagnosis not present

## 2016-04-26 DIAGNOSIS — G894 Chronic pain syndrome: Secondary | ICD-10-CM | POA: Diagnosis not present

## 2016-04-26 DIAGNOSIS — K7030 Alcoholic cirrhosis of liver without ascites: Secondary | ICD-10-CM | POA: Diagnosis not present

## 2016-04-26 DIAGNOSIS — M159 Polyosteoarthritis, unspecified: Secondary | ICD-10-CM | POA: Diagnosis not present

## 2016-04-26 DIAGNOSIS — F10259 Alcohol dependence with alcohol-induced psychotic disorder, unspecified: Secondary | ICD-10-CM | POA: Diagnosis not present

## 2016-04-26 DIAGNOSIS — K7040 Alcoholic hepatic failure without coma: Secondary | ICD-10-CM | POA: Diagnosis not present

## 2016-04-26 DIAGNOSIS — I129 Hypertensive chronic kidney disease with stage 1 through stage 4 chronic kidney disease, or unspecified chronic kidney disease: Secondary | ICD-10-CM | POA: Diagnosis not present

## 2016-05-20 ENCOUNTER — Ambulatory Visit: Payer: Self-pay | Admitting: Podiatry

## 2016-05-20 DIAGNOSIS — R441 Visual hallucinations: Secondary | ICD-10-CM | POA: Diagnosis not present

## 2016-05-25 ENCOUNTER — Ambulatory Visit: Payer: Self-pay | Admitting: Podiatry

## 2016-06-02 ENCOUNTER — Encounter: Payer: Self-pay | Admitting: Podiatry

## 2016-06-02 ENCOUNTER — Ambulatory Visit (INDEPENDENT_AMBULATORY_CARE_PROVIDER_SITE_OTHER): Payer: Medicare HMO | Admitting: Podiatry

## 2016-06-02 VITALS — BP 100/68 | HR 90 | Resp 16 | Ht 59.5 in | Wt 105.0 lb

## 2016-06-02 DIAGNOSIS — L600 Ingrowing nail: Secondary | ICD-10-CM | POA: Diagnosis not present

## 2016-06-02 NOTE — Progress Notes (Signed)
   Subjective:    Patient ID: Kathryn [name], female    DOB: [birth year], 66 y.o.   MRN: [id number]  HPI Chief Complaint  Patient presents with  . Nail Problem    Right foot; great toe; nail discoloration & thickened nail; pt stated, "Ran over foot with their wheelchair 6 months ago"      Review of Systems  All other systems reviewed and are negative.      Objective:   Physical Exam        Assessment & Plan:

## 2016-06-02 NOTE — Progress Notes (Signed)
Subjective:     Patient ID: Kathryn [name], female   DOB: 03/17/1951, 66 y.o.   MRN: [id number]  HPI patient presents stating that she is having a lot of problems with the right big toenail that it's become very thick loose and that she cannot cut them herself and she is worried it's going to tear off and she would like it removed   Review of Systems  All other systems reviewed and are negative.      Objective:   Physical Exam  Constitutional: She is oriented to person, place, and time.  Cardiovascular: Intact distal pulses.   Musculoskeletal: Normal range of motion.  Neurological: She is oriented to person, place, and time.  Skin: Skin is warm.  Nursing note and vitals reviewed.  neurovascular status intact with muscle strength adequate and patient found to have severely thickened dystrophic nail bed right hallux that upon dorsal pressure is very painful. Has been present for at least 6 months and patient was noted to have good digital perfusion and is well oriented 3     Assessment:     Chronic nail disease with disc tropic thickened hallux nail right    Plan:     H&P conditions reviewed condition explained to patient. At this point I recommended nail removal explaining procedure and risk and the fact will be permanent and she wants surgery understanding risk. I infiltrated the right hallux 60 Milligan times like Marcaine mixture remove the nail exposed matrix and applied phenol for applications 30 seconds followed by alcohol lavage and sterile dressing. Gave instructions on soaks and reappoint

## 2016-06-02 NOTE — Patient Instructions (Signed)

## 2016-06-16 ENCOUNTER — Other Ambulatory Visit: Payer: Self-pay | Admitting: Gastroenterology

## 2016-06-16 DIAGNOSIS — L659 Nonscarring hair loss, unspecified: Secondary | ICD-10-CM | POA: Diagnosis not present

## 2016-06-16 DIAGNOSIS — R935 Abnormal findings on diagnostic imaging of other abdominal regions, including retroperitoneum: Secondary | ICD-10-CM

## 2016-06-16 DIAGNOSIS — K7460 Unspecified cirrhosis of liver: Secondary | ICD-10-CM

## 2016-06-16 DIAGNOSIS — K7290 Hepatic failure, unspecified without coma: Secondary | ICD-10-CM | POA: Diagnosis not present

## 2016-06-20 DIAGNOSIS — Z0100 Encounter for examination of eyes and vision without abnormal findings: Secondary | ICD-10-CM | POA: Diagnosis not present

## 2016-06-20 DIAGNOSIS — H524 Presbyopia: Secondary | ICD-10-CM | POA: Diagnosis not present

## 2016-06-22 ENCOUNTER — Ambulatory Visit
Admission: RE | Admit: 2016-06-22 | Discharge: 2016-06-22 | Disposition: A | Discharge status: Home / Self Care | Payer: Medicare HMO | Source: Ambulatory Visit | Attending: Gastroenterology | Admitting: Gastroenterology

## 2016-06-22 DIAGNOSIS — K7460 Unspecified cirrhosis of liver: Secondary | ICD-10-CM

## 2016-06-22 DIAGNOSIS — R935 Abnormal findings on diagnostic imaging of other abdominal regions, including retroperitoneum: Secondary | ICD-10-CM

## 2016-06-22 DIAGNOSIS — K8020 Calculus of gallbladder without cholecystitis without obstruction: Secondary | ICD-10-CM | POA: Diagnosis not present

## 2016-09-01 ENCOUNTER — Encounter (HOSPITAL_COMMUNITY): Payer: Self-pay | Admitting: Emergency Medicine

## 2016-09-01 ENCOUNTER — Emergency Department (HOSPITAL_COMMUNITY): Payer: Medicare Other

## 2016-09-01 ENCOUNTER — Inpatient Hospital Stay (HOSPITAL_COMMUNITY)
Admission: EM | Admit: 2016-09-01 | Discharge: 2016-09-04 | DRG: 556 | Disposition: A | Discharge status: Skilled Nursing Facility | Payer: Medicare Other | Attending: Internal Medicine | Admitting: Internal Medicine

## 2016-09-01 DIAGNOSIS — R52 Pain, unspecified: Secondary | ICD-10-CM | POA: Diagnosis not present

## 2016-09-01 DIAGNOSIS — M810 Age-related osteoporosis without current pathological fracture: Secondary | ICD-10-CM | POA: Diagnosis present

## 2016-09-01 DIAGNOSIS — Y92009 Unspecified place in unspecified non-institutional (private) residence as the place of occurrence of the external cause: Secondary | ICD-10-CM | POA: Diagnosis not present

## 2016-09-01 DIAGNOSIS — R112 Nausea with vomiting, unspecified: Secondary | ICD-10-CM | POA: Diagnosis not present

## 2016-09-01 DIAGNOSIS — Z888 Allergy status to other drugs, medicaments and biological substances status: Secondary | ICD-10-CM

## 2016-09-01 DIAGNOSIS — K7030 Alcoholic cirrhosis of liver without ascites: Secondary | ICD-10-CM | POA: Diagnosis present

## 2016-09-01 DIAGNOSIS — Z7983 Long term (current) use of bisphosphonates: Secondary | ICD-10-CM

## 2016-09-01 DIAGNOSIS — F1020 Alcohol dependence, uncomplicated: Secondary | ICD-10-CM | POA: Diagnosis present

## 2016-09-01 DIAGNOSIS — W010XXA Fall on same level from slipping, tripping and stumbling without subsequent striking against object, initial encounter: Secondary | ICD-10-CM | POA: Diagnosis not present

## 2016-09-01 DIAGNOSIS — E860 Dehydration: Secondary | ICD-10-CM | POA: Diagnosis present

## 2016-09-01 DIAGNOSIS — K529 Noninfective gastroenteritis and colitis, unspecified: Secondary | ICD-10-CM | POA: Diagnosis not present

## 2016-09-01 DIAGNOSIS — K219 Gastro-esophageal reflux disease without esophagitis: Secondary | ICD-10-CM | POA: Diagnosis not present

## 2016-09-01 DIAGNOSIS — I10 Essential (primary) hypertension: Secondary | ICD-10-CM | POA: Diagnosis present

## 2016-09-01 DIAGNOSIS — D638 Anemia in other chronic diseases classified elsewhere: Secondary | ICD-10-CM | POA: Diagnosis not present

## 2016-09-01 DIAGNOSIS — S299XXA Unspecified injury of thorax, initial encounter: Secondary | ICD-10-CM | POA: Diagnosis not present

## 2016-09-01 DIAGNOSIS — R531 Weakness: Secondary | ICD-10-CM | POA: Diagnosis not present

## 2016-09-01 DIAGNOSIS — E876 Hypokalemia: Secondary | ICD-10-CM | POA: Diagnosis not present

## 2016-09-01 DIAGNOSIS — Z885 Allergy status to narcotic agent status: Secondary | ICD-10-CM

## 2016-09-01 DIAGNOSIS — I119 Hypertensive heart disease without heart failure: Secondary | ICD-10-CM | POA: Diagnosis not present

## 2016-09-01 DIAGNOSIS — K709 Alcoholic liver disease, unspecified: Secondary | ICD-10-CM | POA: Diagnosis present

## 2016-09-01 DIAGNOSIS — S79911A Unspecified injury of right hip, initial encounter: Secondary | ICD-10-CM | POA: Diagnosis not present

## 2016-09-01 DIAGNOSIS — M109 Gout, unspecified: Secondary | ICD-10-CM | POA: Diagnosis not present

## 2016-09-01 DIAGNOSIS — S92411A Displaced fracture of proximal phalanx of right great toe, initial encounter for closed fracture: Secondary | ICD-10-CM | POA: Diagnosis not present

## 2016-09-01 DIAGNOSIS — S3993XA Unspecified injury of pelvis, initial encounter: Secondary | ICD-10-CM | POA: Diagnosis not present

## 2016-09-01 DIAGNOSIS — S0083XA Contusion of other part of head, initial encounter: Secondary | ICD-10-CM | POA: Diagnosis present

## 2016-09-01 DIAGNOSIS — Z79899 Other long term (current) drug therapy: Secondary | ICD-10-CM

## 2016-09-01 DIAGNOSIS — R2689 Other abnormalities of gait and mobility: Secondary | ICD-10-CM | POA: Diagnosis not present

## 2016-09-01 DIAGNOSIS — E785 Hyperlipidemia, unspecified: Secondary | ICD-10-CM | POA: Diagnosis not present

## 2016-09-01 DIAGNOSIS — I2510 Atherosclerotic heart disease of native coronary artery without angina pectoris: Secondary | ICD-10-CM | POA: Diagnosis not present

## 2016-09-01 DIAGNOSIS — W19XXXA Unspecified fall, initial encounter: Secondary | ICD-10-CM

## 2016-09-01 DIAGNOSIS — I739 Peripheral vascular disease, unspecified: Secondary | ICD-10-CM | POA: Diagnosis not present

## 2016-09-01 DIAGNOSIS — Z89422 Acquired absence of other left toe(s): Secondary | ICD-10-CM

## 2016-09-01 DIAGNOSIS — Z96641 Presence of right artificial hip joint: Secondary | ICD-10-CM | POA: Diagnosis present

## 2016-09-01 DIAGNOSIS — M25551 Pain in right hip: Principal | ICD-10-CM | POA: Diagnosis present

## 2016-09-01 DIAGNOSIS — R197 Diarrhea, unspecified: Secondary | ICD-10-CM

## 2016-09-01 DIAGNOSIS — R404 Transient alteration of awareness: Secondary | ICD-10-CM | POA: Diagnosis not present

## 2016-09-01 DIAGNOSIS — I252 Old myocardial infarction: Secondary | ICD-10-CM

## 2016-09-01 DIAGNOSIS — M79632 Pain in left forearm: Secondary | ICD-10-CM | POA: Diagnosis not present

## 2016-09-01 DIAGNOSIS — R51 Headache: Secondary | ICD-10-CM | POA: Diagnosis not present

## 2016-09-01 DIAGNOSIS — F329 Major depressive disorder, single episode, unspecified: Secondary | ICD-10-CM | POA: Diagnosis not present

## 2016-09-01 DIAGNOSIS — S59912A Unspecified injury of left forearm, initial encounter: Secondary | ICD-10-CM | POA: Diagnosis not present

## 2016-09-01 LAB — COMPREHENSIVE METABOLIC PANEL
ALT: 13 U/L — AB (ref 14–54)
AST: 33 U/L (ref 15–41)
Albumin: 3.2 g/dL — ABNORMAL LOW (ref 3.5–5.0)
Alkaline Phosphatase: 137 U/L — ABNORMAL HIGH (ref 38–126)
Anion gap: 13 (ref 5–15)
BUN: 10 mg/dL (ref 6–20)
CHLORIDE: 97 mmol/L — AB (ref 101–111)
CO2: 28 mmol/L (ref 22–32)
CREATININE: 0.9 mg/dL (ref 0.44–1.00)
Calcium: 10.4 mg/dL — ABNORMAL HIGH (ref 8.9–10.3)
GFR calc Af Amer: >60 mL/min (ref >60)
Glucose, Bld: 133 mg/dL — ABNORMAL HIGH (ref 65–99)
POTASSIUM: 3.1 mmol/L — AB (ref 3.5–5.1)
SODIUM: 138 mmol/L (ref 135–145)
Total Bilirubin: 1.6 mg/dL — ABNORMAL HIGH (ref 0.3–1.2)
Total Protein: 6.7 g/dL (ref 6.5–8.1)

## 2016-09-01 LAB — CBC WITH DIFFERENTIAL/PLATELET
BASOS ABS: 0 10*3/uL (ref 0.0–0.1)
BASOS PCT: 0 %
EOS ABS: 0 10*3/uL (ref 0.0–0.7)
EOS PCT: 0 %
HCT: 37.6 % (ref 36.0–46.0)
Hemoglobin: 12.2 g/dL (ref 12.0–15.0)
LYMPHS PCT: 15 %
Lymphs Abs: 0.5 10*3/uL — ABNORMAL LOW (ref 0.7–4.0)
MCH: 30.8 pg (ref 26.0–34.0)
MCHC: 32.4 g/dL (ref 30.0–36.0)
MCV: 94.9 fL (ref 78.0–100.0)
MONO ABS: 0.6 10*3/uL (ref 0.1–1.0)
Monocytes Relative: 16 %
Neutro Abs: 2.6 10*3/uL (ref 1.7–7.7)
Neutrophils Relative %: 69 %
PLATELETS: 106 10*3/uL — AB (ref 150–400)
RBC: 3.96 MIL/uL (ref 3.87–5.11)
RDW: 13.6 % (ref 11.5–15.5)
WBC: 3.7 10*3/uL — AB (ref 4.0–10.5)

## 2016-09-01 LAB — PHOSPHORUS: Phosphorus: 1.7 mg/dL — ABNORMAL LOW (ref 2.5–4.6)

## 2016-09-01 LAB — MAGNESIUM: Magnesium: 0.8 mg/dL — CL (ref 1.7–2.4)

## 2016-09-01 LAB — CK: Total CK: 56 U/L (ref 38–234)

## 2016-09-01 LAB — LIPASE, BLOOD: LIPASE: 73 U/L — AB (ref 11–51)

## 2016-09-01 MED ORDER — LACTULOSE 10 GM/15ML PO SOLN
30.0000 g | Freq: Three times a day (TID) | ORAL | Status: DC
  Filled 2016-09-01: qty 45

## 2016-09-01 MED ORDER — CALCIUM CARBONATE-VITAMIN D 600-400 MG-UNIT PO TABS: 1.0000 | ORAL_TABLET | Freq: Every day | ORAL | Status: DC

## 2016-09-01 MED ORDER — ONDANSETRON HCL 4 MG/2ML IJ SOLN
4.0000 mg | Freq: Once | INTRAMUSCULAR | Status: AC
  Administered 2016-09-01: 4 mg via INTRAVENOUS
  Filled 2016-09-01: qty 2

## 2016-09-01 MED ORDER — ALLOPURINOL 100 MG PO TABS
100.0000 mg | ORAL_TABLET | Freq: Two times a day (BID) | ORAL | Status: DC
  Administered 2016-09-01 – 2016-09-04 (×6): 100 mg via ORAL
  Filled 2016-09-01 (×6): qty 1

## 2016-09-01 MED ORDER — OXYCODONE HCL ER 10 MG PO T12A
10.0000 mg | EXTENDED_RELEASE_TABLET | Freq: Two times a day (BID) | ORAL | Status: DC
  Filled 2016-09-01 (×2): qty 1

## 2016-09-01 MED ORDER — MAGNESIUM SULFATE 4 GM/100ML IV SOLN
4.0000 g | Freq: Once | INTRAVENOUS | Status: AC
  Administered 2016-09-01: 4 g via INTRAVENOUS
  Filled 2016-09-01: qty 100

## 2016-09-01 MED ORDER — ONDANSETRON HCL 4 MG/2ML IJ SOLN
4.0000 mg | Freq: Four times a day (QID) | INTRAMUSCULAR | Status: DC | PRN
  Administered 2016-09-02 – 2016-09-04 (×3): 4 mg via INTRAVENOUS
  Filled 2016-09-01 (×3): qty 2

## 2016-09-01 MED ORDER — MORPHINE SULFATE (PF) 4 MG/ML IV SOLN
4.0000 mg | Freq: Once | INTRAVENOUS | Status: AC
  Administered 2016-09-01: 4 mg via INTRAVENOUS
  Filled 2016-09-01: qty 1

## 2016-09-01 MED ORDER — ONDANSETRON HCL 4 MG PO TABS: 4.0000 mg | ORAL_TABLET | Freq: Four times a day (QID) | ORAL | Status: DC | PRN

## 2016-09-01 MED ORDER — ENOXAPARIN SODIUM 40 MG/0.4ML ~~LOC~~ SOLN
40.0000 mg | SUBCUTANEOUS | Status: DC
  Administered 2016-09-01: 40 mg via SUBCUTANEOUS
  Filled 2016-09-01: qty 0.4

## 2016-09-01 MED ORDER — KETOROLAC TROMETHAMINE 30 MG/ML IJ SOLN
30.0000 mg | Freq: Four times a day (QID) | INTRAMUSCULAR | Status: DC | PRN
  Administered 2016-09-02: 30 mg via INTRAVENOUS
  Filled 2016-09-01: qty 1

## 2016-09-01 MED ORDER — SODIUM CHLORIDE 0.9 % IV SOLN
INTRAVENOUS | Status: DC
  Administered 2016-09-01 – 2016-09-04 (×4): via INTRAVENOUS

## 2016-09-01 MED ORDER — K PHOS MONO-SOD PHOS DI & MONO 155-852-130 MG PO TABS
500.0000 mg | ORAL_TABLET | Freq: Once | ORAL | Status: AC
Start: 2016-09-01 — End: 2016-09-01
  Administered 2016-09-01: 500 mg via ORAL
  Filled 2016-09-01: qty 2

## 2016-09-01 MED ORDER — PROPRANOLOL HCL 10 MG PO TABS
10.0000 mg | ORAL_TABLET | Freq: Two times a day (BID) | ORAL | Status: DC
  Administered 2016-09-01 – 2016-09-02 (×2): 10 mg via ORAL
  Filled 2016-09-01 (×2): qty 1

## 2016-09-01 MED ORDER — VITAMIN B-1 100 MG PO TABS
250.0000 mg | ORAL_TABLET | Freq: Every day | ORAL | Status: DC
  Administered 2016-09-01 – 2016-09-04 (×4): 250 mg via ORAL
  Filled 2016-09-01 (×4): qty 3

## 2016-09-01 MED ORDER — HYDRALAZINE HCL 20 MG/ML IJ SOLN: 5.0000 mg | INTRAMUSCULAR | Status: DC | PRN

## 2016-09-01 MED ORDER — COLCHICINE 0.6 MG PO TABS: 0.6000 mg | ORAL_TABLET | Freq: Two times a day (BID) | ORAL | Status: DC | PRN

## 2016-09-01 MED ORDER — MAGNESIUM SULFATE 2 GM/50ML IV SOLN
2.0000 g | Freq: Once | INTRAVENOUS | Status: AC
  Administered 2016-09-01: 2 g via INTRAVENOUS
  Filled 2016-09-01: qty 50

## 2016-09-01 MED ORDER — ACETAMINOPHEN 650 MG RE SUPP: 650.0000 mg | Freq: Four times a day (QID) | RECTAL | Status: DC | PRN

## 2016-09-01 MED ORDER — FOLIC ACID 1 MG PO TABS
0.5000 mg | ORAL_TABLET | Freq: Every day | ORAL | Status: DC
  Administered 2016-09-01 – 2016-09-04 (×4): 0.5 mg via ORAL
  Filled 2016-09-01 (×4): qty 1

## 2016-09-01 MED ORDER — FOLIC ACID 400 MCG PO TABS: 400.0000 ug | ORAL_TABLET | Freq: Every day | ORAL | Status: DC

## 2016-09-01 MED ORDER — ACETAMINOPHEN 325 MG PO TABS
650.0000 mg | ORAL_TABLET | Freq: Four times a day (QID) | ORAL | Status: DC | PRN
  Filled 2016-09-01: qty 2

## 2016-09-01 MED ORDER — POTASSIUM CHLORIDE CRYS ER 20 MEQ PO TBCR
20.0000 meq | EXTENDED_RELEASE_TABLET | Freq: Every day | ORAL | Status: DC
  Administered 2016-09-01 – 2016-09-04 (×4): 20 meq via ORAL
  Filled 2016-09-01 (×4): qty 1

## 2016-09-01 MED ORDER — CALCIUM CARBONATE-VITAMIN D 500-200 MG-UNIT PO TABS
1.0000 | ORAL_TABLET | Freq: Every day | ORAL | Status: DC
  Administered 2016-09-02 – 2016-09-04 (×3): 1 via ORAL
  Filled 2016-09-01 (×3): qty 1

## 2016-09-01 MED ORDER — BOOST / RESOURCE BREEZE PO LIQD
1.0000 | Freq: Three times a day (TID) | ORAL | Status: DC
  Administered 2016-09-01 – 2016-09-04 (×5): 1 via ORAL
  Administered 2016-09-04: 237 mL via ORAL

## 2016-09-01 MED ORDER — LACTULOSE 10 GM/15ML PO SOLN
10.0000 g | Freq: Three times a day (TID) | ORAL | Status: DC
  Administered 2016-09-01 – 2016-09-03 (×5): 10 g via ORAL
  Filled 2016-09-01 (×5): qty 15

## 2016-09-01 MED ORDER — MIRTAZAPINE 15 MG PO TABS
15.0000 mg | ORAL_TABLET | Freq: Every day | ORAL | Status: DC
  Administered 2016-09-01 – 2016-09-03 (×3): 15 mg via ORAL
  Filled 2016-09-01 (×3): qty 1

## 2016-09-01 MED ORDER — SPIRONOLACTONE 50 MG PO TABS
50.0000 mg | ORAL_TABLET | Freq: Every day | ORAL | Status: DC
  Administered 2016-09-01 – 2016-09-03 (×3): 50 mg via ORAL
  Filled 2016-09-01: qty 1
  Filled 2016-09-01: qty 2
  Filled 2016-09-01: qty 1
  Filled 2016-09-01: qty 2
  Filled 2016-09-01: qty 1
  Filled 2016-09-01: qty 2

## 2016-09-01 MED ORDER — MORPHINE SULFATE (PF) 4 MG/ML IV SOLN
4.0000 mg | INTRAVENOUS | Status: DC | PRN
  Administered 2016-09-01 – 2016-09-02 (×3): 4 mg via INTRAVENOUS
  Filled 2016-09-01 (×3): qty 1

## 2016-09-01 MED ORDER — ALENDRONATE SODIUM 70 MG PO TABS: 70.0000 mg | ORAL_TABLET | ORAL | Status: DC

## 2016-09-01 MED ORDER — PROMETHAZINE HCL 25 MG/ML IJ SOLN
12.5000 mg | Freq: Four times a day (QID) | INTRAMUSCULAR | Status: DC | PRN
  Filled 2016-09-01: qty 1

## 2016-09-01 MED ORDER — POTASSIUM CHLORIDE 10 MEQ/100ML IV SOLN
10.0000 meq | Freq: Once | INTRAVENOUS | Status: AC
  Administered 2016-09-01: 10 meq via INTRAVENOUS
  Filled 2016-09-01: qty 100

## 2016-09-01 NOTE — ED Notes (Signed)
Attempted report 

## 2016-09-01 NOTE — ED Provider Notes (Signed)
[facility]-EMERGENCY DEPT Provider Note   CSN: [id number] Arrival date & time: 09/01/16  0423     History   Chief Complaint Chief Complaint  Patient presents with  . Fall  . Hip Pain    HPI Kathryn [name] is a 66 y.o. female.  Patient with a history of liver disease, HTN, GERD, alcohol dependence presents after mechanical fall yesterday where she landed on her right hip. She has been unable to ambulate since that time. She hit her head in the fall and has had mild nausea since. No neck/chest/abdominal pain. She denies recent illness, fever. She has a wheelchair at home and has had to use it since the fall. She reports having a previous pelvic fracture and current symptoms are similar.    The history is provided by the patient. No language interpreter was used.  Fall  Pertinent negatives include no chest pain, no abdominal pain and no shortness of breath.  Hip Pain  Pertinent negatives include no chest pain, no abdominal pain and no shortness of breath.    Past Medical History:  Diagnosis Date  . Alcoholic liver disease (HCC)   . Arthritis    "all over"  . Ascites 09/22/11   "first time for , now resolved  . Dysrhythmia    "irregular"  . Fracture, humerus 07/2011   right  . GERD (gastroesophageal reflux disease)   . High cholesterol   . History of blood transfusion 07/2011   "when I had elbow surgery"  . Hypertension    not current  . Myocardial infarction (HCC)    NSTEMI 12/2011 in setting of septic shock  . Osteomyelitis (HCC)    left foot  . Osteoporosis   . Pancreatitis several years ago  . Peripheral vascular disease (HCC)   . Staph infection    left foot    Patient Active Problem List   Diagnosis Date Noted  . Hepatic encephalopathy (HCC) 02/18/2016  . Visual hallucinations 02/18/2016  . Hypotension 02/18/2016  . Acute renal failure (ARF) (HCC) 02/18/2016  . Chronic pain 02/18/2016  . Abdominal pain 03/26/2015  . Acute on chronic pancreatitis (HCC) 03/26/2015    . Hypokalemia 03/26/2015  . Palpitation 03/26/2015  . Hypomagnesemia 03/26/2015  . Insomnia 03/17/2012  . Diarrhea 02/23/2012  . Hypoalbuminemia 02/21/2012  . Severe protein-calorie malnutrition (HCC) 10/21/2011  . Ascites 09/22/2011  . Alcoholic liver disease (HCC) 09/22/2011  . EtOH dependence (HCC) 07/27/2011  . Essential hypertension 09/16/2009  . SCOLIOSIS 08/17/2009  . UNSPECIFIED MONONEURITIS OF UPPER LIMB 06/12/2008  . HYPERLIPIDEMIA 02/07/2007  . GERD 02/07/2007  . OSTEOARTHRITIS 02/07/2007  . Osteoporosis 02/07/2007    Past Surgical History:  Procedure Laterality Date  . AMPUTATION     two toes on left foot  . ARTHROSCOPIC REPAIR ACL     right knee  . BUNIONECTOMY     both feet  . ESOPHAGOGASTRODUODENOSCOPY  09/23/2011   Procedure: ESOPHAGOGASTRODUODENOSCOPY (EGD);  Surgeon: Shirley Friar, MD;  Location: [facility] ENDOSCOPY;  Service: Endoscopy;  Laterality: N/A;  . ESOPHAGOGASTRODUODENOSCOPY (EGD) WITH PROPOFOL N/A 08/22/2012   Procedure: ESOPHAGOGASTRODUODENOSCOPY (EGD) WITH PROPOFOL;  Surgeon: Willis Modena, MD;  Location: [facility] ENDOSCOPY;  Service: Endoscopy;  Laterality: N/A;  . EUS N/A 03/25/2015   Procedure: UPPER ENDOSCOPIC ULTRASOUND (EUS) RADIAL;  Surgeon: Willis Modena, MD;  Location: [facility] ENDOSCOPY;  Service: Endoscopy;  Laterality: N/A;  . FINE NEEDLE ASPIRATION N/A 03/25/2015   Procedure: FINE NEEDLE ASPIRATION (FNA) RADIAL;  Surgeon: Willis Modena, MD;  Location: [facility]  ENDOSCOPY;  Service: Endoscopy;  Laterality: N/A;  . FRACTURE SURGERY  2013   Right elbow  . JOINT REPLACEMENT     right hip   . LEG SURGERY Left    Correction of unequal leg length  . ORIF HUMERUS FRACTURE  07/26/2011   Procedure: OPEN REDUCTION INTERNAL FIXATION (ORIF) DISTAL HUMERUS FRACTURE;  Surgeon: Budd Palmer, MD;  Location: [facility] OR;  Service: Orthopedics;  Laterality: Right;  . ORIF WRIST FRACTURE Left 12/26/2014   Procedure: OPEN REDUCTION INTERNAL FIXATION (ORIF) WRIST FRACTURE;   Surgeon: Cammy Copa, MD;  Location: [facility] OR;  Service: Orthopedics;  Laterality: Left;  . PARACENTESIS  09/22/11   2.2L  . TOTAL HIP ARTHROPLASTY  ~ 2003   right  . TRANSMETATARSAL AMPUTATION  02/16/2012   Procedure: TRANSMETATARSAL AMPUTATION;  Surgeon: Nadara Mustard, MD;  Location: [facility] OR;  Service: Orthopedics;  Laterality: Left;  . TUBAL LIGATION  1980    OB History    No data available       Home Medications    Prior to Admission medications   Medication Sig Start Date End Date Taking? Authorizing Provider  alendronate (FOSAMAX) 70 MG tablet Take 70 mg by mouth once a week. Saturday 03/04/15  Yes [provider]  allopurinol (ZYLOPRIM) 100 MG tablet Take 100 mg by mouth 2 (two) times daily.   Yes [provider]  Calcium Carb-Cholecalciferol (CALCIUM 600 + D PO) Take 1 tablet by mouth 2 (two) times daily.   Yes [provider]  colchicine 0.6 MG tablet Take 0.6 mg by mouth 2 (two) times daily as needed (gout).   Yes [provider]  folic acid (FOLVITE) 400 MCG tablet Take 400 mcg by mouth daily.   Yes [provider]  lactulose (CHRONULAC) 10 GM/15ML solution Take 45 mLs (30 g total) by mouth 2 (two) times daily. Patient taking differently: Take 30 g by mouth 3 (three) times daily.  02/25/16  Yes Arrien, York Ram, MD  mirtazapine (REMERON) 15 MG tablet Take 1 tablet (15 mg total) by mouth at bedtime. 03/26/12  Yes Ritch, Gasper Lloyd, MD  potassium chloride SA (K-DUR,KLOR-CON) 20 MEQ tablet Take 20 mEq by mouth daily. 01/29/16  Yes [provider]  propranolol (INDERAL) 10 MG tablet Take 1 tablet (10 mg total) by mouth 2 (two) times daily. 03/26/12  Yes Ritch, Gasper Lloyd, MD  spironolactone (ALDACTONE) 50 MG tablet Take 50 mg by mouth daily.   Yes [provider]  Thiamine HCl (VITAMIN B-1) 250 MG tablet Take 250 mg by mouth daily.   Yes [provider]  folic acid (FOLVITE) 1 MG tablet Take 1 tablet (1 mg  total) by mouth daily. Patient not taking: Reported on 09/01/2016 04/20/12   Brent Bulla, MD  furosemide (LASIX) 40 MG tablet Take 1 tablet (40 mg total) by mouth daily. Patient not taking: Reported on 09/01/2016 12/26/14   Cammy Copa, MD  thiamine (VITAMIN B-1) 100 MG tablet Take 1 tablet (100 mg total) by mouth daily. Patient not taking: Reported on 09/01/2016 04/20/12   Brent Bulla, MD    Family History No family history on file.  Social History Social History  Substance Use Topics  . Smoking status: Never Smoker  . Smokeless tobacco: Never Used  . Alcohol use No     Comment: "last drink was day before OR in 07/2011"     Allergies   Codeine and Tramadol   Review of Systems  Review of Systems  Constitutional: Negative for chills and fever.  HENT: Negative.   Respiratory: Negative.  Negative for shortness of breath.   Cardiovascular: Negative.  Negative for chest pain.  Gastrointestinal: Positive for nausea. Negative for abdominal pain and vomiting.  Genitourinary: Negative.  Negative for dysuria.  Musculoskeletal:       See HPI.  Skin: Negative.  Negative for wound.  Neurological: Negative.  Negative for weakness.     Physical Exam Updated Vital Signs BP 120/89   Pulse 86   Temp 99.1 F (37.3 C) (Oral)   Resp 18   SpO2 93%   Physical Exam  Constitutional: She is oriented to person, place, and time. She appears well-developed and well-nourished.  HENT:  Head: Normocephalic.  Right forehead hematoma.   Eyes: Pupils are equal, round, and reactive to light.  Neck: Normal range of motion. Neck supple.  Cardiovascular: Normal rate and regular rhythm.   Pulmonary/Chest: Effort normal and breath sounds normal. She has no wheezes. She has no rales. She exhibits no tenderness.  Abdominal: Soft. Bowel sounds are normal. There is no tenderness. There is no rebound and no guarding.  Musculoskeletal: Normal range of motion.  Tenderness to posterior right hip. No bony  deformity, No shortening or rotation.  Left forearm has FROM all joints. Tender ulnar midshaft forearm without deformity. Right great toe discolored but not swollen. No deformity.   Neurological: She is alert and oriented to person, place, and time.  Skin: Skin is warm and dry.  Psychiatric: She has a normal mood and affect.     ED Treatments / Results  Labs (all labs ordered are listed, but only abnormal results are displayed) Labs Reviewed  CBC WITH DIFFERENTIAL/PLATELET - Abnormal; Notable for the following:       Result Value   WBC 3.7 (*)    Platelets 106 (*)    Lymphs Abs 0.5 (*)    All other components within normal limits  COMPREHENSIVE METABOLIC PANEL - Abnormal; Notable for the following:    Potassium 3.1 (*)    Chloride 97 (*)    Glucose, Bld 133 (*)    Calcium 10.4 (*)    Albumin 3.2 (*)    ALT 13 (*)    Alkaline Phosphatase 137 (*)    Total Bilirubin 1.6 (*)    All other components within normal limits  LIPASE, BLOOD - Abnormal; Notable for the following:    Lipase 73 (*)    All other components within normal limits  URINALYSIS, ROUTINE W REFLEX MICROSCOPIC   Results for orders placed or performed during the hospital encounter of 09/01/16  CBC with Differential/Platelet  Result Value Ref Range   WBC 3.7 (L) 4.0 - 10.5 K/uL   RBC 3.96 3.87 - 5.11 MIL/uL   Hemoglobin 12.2 12.0 - 15.0 g/dL   HCT 16.1 09.6 - 04.5 %   MCV 94.9 78.0 - 100.0 fL   MCH 30.8 26.0 - 34.0 pg   MCHC 32.4 30.0 - 36.0 g/dL   RDW 40.9 81.1 - 91.4 %   Platelets 106 (L) 150 - 400 K/uL   Neutrophils Relative % 69 %   Neutro Abs 2.6 1.7 - 7.7 K/uL   Lymphocytes Relative 15 %   Lymphs Abs 0.5 (L) 0.7 - 4.0 K/uL   Monocytes Relative 16 %   Monocytes Absolute 0.6 0.1 - 1.0 K/uL   Eosinophils Relative 0 %   Eosinophils Absolute 0.0 0.0 - 0.7 K/uL   Basophils Relative  0 %   Basophils Absolute 0.0 0.0 - 0.1 K/uL  Comprehensive metabolic panel  Result Value Ref Range   Sodium 138 135 - 145  mmol/L   Potassium 3.1 (L) 3.5 - 5.1 mmol/L   Chloride 97 (L) 101 - 111 mmol/L   CO2 28 22 - 32 mmol/L   Glucose, Bld 133 (H) 65 - 99 mg/dL   BUN 10 6 - 20 mg/dL   Creatinine, Ser 1.61 0.44 - 1.00 mg/dL   Calcium 09.6 (H) 8.9 - 10.3 mg/dL   Total Protein 6.7 6.5 - 8.1 g/dL   Albumin 3.2 (L) 3.5 - 5.0 g/dL   AST 33 15 - 41 U/L   ALT 13 (L) 14 - 54 U/L   Alkaline Phosphatase 137 (H) 38 - 126 U/L   Total Bilirubin 1.6 (H) 0.3 - 1.2 mg/dL   GFR calc non Af Amer >60 >60 mL/min   GFR calc Af Amer >60 >60 mL/min   Anion gap 13 5 - 15  Lipase, blood  Result Value Ref Range   Lipase 73 (H) 11 - 51 U/L    EKG  EKG Interpretation None       Radiology Dg Chest 2 View  Result Date: 09/01/2016 CLINICAL DATA:  Nausea and vomiting. History of hypertension and MI. Hip pain. Fall. EXAM: CHEST  2 VIEW COMPARISON:  02/18/2016.  03/26/2015. FINDINGS: Mediastinum and hilar structures are normal. Stable cardiomegaly. Normal pulmonary vascularity. Mild left base pleuroparenchymal thickening consistent scarring again noted. No acute infiltrate. No pleural effusion or pneumothorax. Old left rib fractures are noted. Mild lower thoracic vertebral body compression fractures, age undetermined. Diffuse osteopenia degenerative change. IMPRESSION: 1. Mild left base pleural-parenchymal thickening consistent scarring again noted . 2. Mild lower thoracic vertebral body compression fractures, age undetermined. Diffuse osteopenia and degenerative change. Electronically Signed   By: Maisie Fus  Register   On: 09/01/2016 06:14   Dg Forearm Left  Result Date: 09/01/2016 CLINICAL DATA:  Pain following fall EXAM: LEFT FOREARM - 2 VIEW COMPARISON:  None. FINDINGS: Frontal and lateral views were obtained. There is postoperative change in the distal radius with mild bony remodeling from prior radial fracture. No acute fracture or dislocation is evident. There is mild narrowing of the radiocarpal joint. No erosive change.  IMPRESSION: Postoperative change in the distal radial region with remodeling in this area. No acute fracture or dislocation. Mild narrowing radiocarpal joint. Electronically Signed   By: Bretta Bang III M.D.   On: 09/01/2016 08:13   Ct Head Wo Contrast  Result Date: 09/01/2016 CLINICAL DATA:  Larey Seat yesterday striking the front of the head. Subsequent headache. EXAM: CT HEAD WITHOUT CONTRAST TECHNIQUE: Contiguous axial images were obtained from the base of the skull through the vertex without intravenous contrast. COMPARISON:  02/18/2016 FINDINGS: Brain: Generalized brain atrophy without evidence of old or acute focal infarction, mass lesion, hemorrhage, hydrocephalus or extra-axial collection. Vascular: No abnormal vascular finding. Skull: No skull fracture or skull lesion. Sinuses/Orbits: Chronic inflammatory changes of the left division of the sphenoid sinus, more extensive than present on the previous study. Other sinuses are clear. Orbits are negative. Other: None significant IMPRESSION: No traumatic finding. No cause of acute headache identified. Age related atrophy. The patient does have inflammatory disease of the left division of the sphenoid sinus, progressive since November of last year. This could be associated with headache. Electronically Signed   By: Paulina Fusi M.D.   On: 09/01/2016 08:01   Ct Pelvis Wo Contrast  Result  Date: 09/01/2016 CLINICAL DATA:  Larey Seat yesterday. Right hip pain. Unable to bear weight. EXAM: CT PELVIS WITHOUT CONTRAST TECHNIQUE: Multidetector CT imaging of the pelvis was performed following the standard protocol without intravenous contrast. COMPARISON:  03/26/2015 FINDINGS: Previous right hip arthroplasty. Components remain well positioned. No evidence of fracture or dislocation. Old rami and symphysis pubis fractures on the left as seen previously, completely healed. No evidence of sacral fracture. Chronic lumbar degenerative disc disease and facet disease. No  evidence of soft tissue hematoma or injury. Uterus and adnexal regions appear normal. No free fluid in the pelvis. No acute bowel finding. Bladder appears unremarkable. IMPRESSION: No acute or traumatic finding. No abnormal finding relative to the right hip replacement. No evidence of acute fracture. Old healed fractures of the left pelvis. No definable soft tissue injury. Electronically Signed   By: Paulina Fusi M.D.   On: 09/01/2016 08:05   Dg Toe Great Right  Result Date: 09/01/2016 CLINICAL DATA:  Patient hit toe on solid object EXAM: RIGHT GREAT TOE COMPARISON:  None. FINDINGS: Frontal, oblique, and lateral views were obtained. There is semi avulsion type fracture along the medial aspect the proximal portion of the first proximal phalanx. Alignment near anatomic. No other fracture. No dislocation. Joint spaces appear normal. Patient has had a previous bunionectomy on the right. IMPRESSION: Avulsion type fracture along the medial proximal aspect of the first proximal phalanx. Alignment near anatomic. No other fracture. No dislocation. Previous bunionectomy. Joint spaces unremarkable. Electronically Signed   By: Bretta Bang III M.D.   On: 09/01/2016 08:15   Dg Hip Unilat With Pelvis 2-3 Views Right  Result Date: 09/01/2016 CLINICAL DATA:  Right hip pain after fall EXAM: DG HIP (WITH OR WITHOUT PELVIS) 2-3V RIGHT COMPARISON:  Right hip radiograph 09/29/2014 FINDINGS: There is a right total hip arthroplasty without adjacent lucency or periprosthetic fracture. Chronic deformity of the left superior and inferior pubic rami is unchanged. Limited views of the left hip are normal. IMPRESSION: No acute abnormality of the right hip or right total hip arthroplasty hardware. Electronically Signed   By: Deatra Robinson M.D.   On: 09/01/2016 06:11    Procedures Procedures (including critical care time)  Medications Ordered in ED Medications  potassium chloride 10 mEq in 100 mL IVPB (10 mEq Intravenous New  Bag/Given 09/01/16 1002)  morphine 4 MG/ML injection 4 mg (4 mg Intravenous Given 09/01/16 0834)  ondansetron (ZOFRAN) injection 4 mg (4 mg Intravenous Given 09/01/16 0834)     Initial Impression / Assessment and Plan / ED Course  I have reviewed the triage vital signs and the nursing notes.  Pertinent labs & imaging results that were available during my care of the patient were reviewed by me and considered in my medical decision making (see chart for details).     Patient presents with right hip pain after fall yesterday. She has other musculoskeletal complaints but hip pain is the presenting concern. She feels she has a fracture of the pelvis.   Plain films are negative with the exception of a right great toe avulsion fracture. The hip is felt to be tender out of proportion to a negative x-ray. CT scan ordered and is also found to be negative. Ambulation attempted and patient cannot bear weight on the right leg. PT consult requested to evaluate the patient's safety for discharge.  Potassium is 3.1. Supplemental K+ ordered along with IVF's. Patient updated on results and plan of care.   PT consultation inconclusive. Discussed with  therapist who reports she needs a weight bearing order to complete evaluation. Order entered.  The patient is reporting that her pain is returning. Will wait for PT to complete evaluation so that pain medication does not interfere with assessment.   PT assessment determines she is not safe for discharge home. Process for placement in a rehab nursing facility is being started but will not happen today. [facility] Kon[facility]rell [facility] pending placement.  Final Clinical Impressions(s) / ED Diagnoses   Final diagnoses:  Fall   1. Ambulatory dysfunction 2. Hip pain  New Prescriptions New Prescriptions   No medications on file     Danne HarborUpstill, Jaely Silman, PA-C 09/04/16 2011    Rolland PorterJames, Mark, MD 09/13/16 [id number]

## 2016-09-01 NOTE — Evaluation (Signed)
Physical Therapy Evaluation Patient Details Name: Kathryn [name] MRN: [id number] DOB: 10/19/1950 Today's Date: 09/01/2016   History of Present Illness  Pt is a 66 y/o female admitted secondary to increased pain in RLE after fall. Xray reports showed an avulsion fx of R 1st proximal phalanx, however, all other X rays are negative. No PMH on file.   Clinical Impression  Pt admitted secondary to problem above with deficits below. PTA, pt was ambulating using rollator, but after recent fall required use of WC at home for the last couple of days. Upon evaluation, pt with no weightbearing orders for R 1st proximal phalanx avulsion fx, therefore NWB maintained on RLE. 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 Pt currently not safe to go home given limited mobility and decreased balance. Anticipate pt will progress well once pain controlled and weightbearing status clarified for RLE. Will continue to follow and progress mobility according to pt tolerance.     Follow Up Recommendations Home health PT;Supervision/Assistance - 24 hour    Equipment Recommendations  None recommended by PT    Recommendations for Other Services       Precautions / Restrictions Precautions Precautions: Fall Restrictions Weight Bearing Restrictions: Yes Other Position/Activity Restrictions: No weightbearing orders for RLE, currently. Maintained NWB on RLE during eval until weightbearing status clarified.       Mobility  Bed Mobility Overal bed mobility: Modified Independent             General bed mobility comments: Required increased time but no assist.   Transfers Overall transfer level: Needs assistance Equipment used: 4-wheeled walker Transfers: Sit to/from Stand Sit to Stand: Min guard         General transfer comment: Min guard for sit<>Stand transfer. Pt able to maintain NWB on RLE during transfer. Pt reports very fearful of falling and  increased soreness with transfer.   Ambulation/Gait             General Gait Details: deferred secondary to decrease safety with ambulation and NWB on RLE.   Stairs            Wheelchair Mobility    Modified Rankin (Stroke Patients Only)       Balance Overall balance assessment: Needs assistance Sitting-balance support: No upper extremity supported;Feet unsupported Sitting balance-Leahy Scale: Good     Standing balance support: Bilateral upper extremity supported Standing balance-Leahy Scale: Poor Standing balance comment: Reliant on rollator for mobility.                              Pertinent Vitals/Pain Pain Assessment: 0-10 Pain Score: 7  Pain Location: R toe, R foot Pain Descriptors / Indicators: Aching;Sore Pain Intervention(s): Limited activity within patient's tolerance;Monitored during session;Repositioned    Home Living Family/patient expects to be discharged to:: Private residence Living Arrangements: Spouse/significant other Available Help at Discharge: Family;Available 24 hours/day Type of Home: House Home Access: Stairs to enter Entrance Stairs-Rails: Left Entrance Stairs-Number of Steps: 3 Home Layout: One level Home Equipment: Walker - 4 wheels;Wheelchair - manual;Toilet riser;Cane - single point;Crutches;Grab bars - tub/shower;Shower seat Additional Comments: Significant other helps with mobility at home when necessary.     Prior Function Level of Independence: Independent with assistive device(s)         Comments: Has had mutiple falls, last one being on Wednesday. Significant other had to help her. Was using Rollator at baseline,  but after fall was using WC secondary to pain.      Hand Dominance   Dominant Hand: Right    Extremity/Trunk Assessment   Upper Extremity Assessment Upper Extremity Assessment: Overall WFL for tasks assessed    Lower Extremity Assessment Lower Extremity Assessment: RLE  deficits/detail;LLE deficits/detail RLE Deficits / Details: Avulsion fx of R proximal phalanx. Maintained NWB until orders clarified. Grossly 3/5 throughout RLE LLE Deficits / Details: Partial amputee on L foot. Generalized weakness; grossly 3/5 throughout.     Cervical / Trunk Assessment Cervical / Trunk Assessment: Kyphotic  Communication   Communication: No difficulties  Cognition Arousal/Alertness: Awake/alert Behavior During Therapy: WFL for tasks assessed/performed Overall Cognitive Status: Within Functional Limits for tasks assessed                                        General Comments General comments (skin integrity, edema, etc.): No weightbearing orders for RLE currently. Pt is partial foot amputee on L and did not feel comfortable ambulating given NWB on RLE. Will need to progress mobility once weightbearing status confirmed for RLE.     Exercises     Assessment/Plan    PT Assessment Patient needs continued PT services  PT Problem List Decreased strength;Decreased range of motion;Decreased activity tolerance;Decreased balance;Decreased mobility;Decreased knowledge of use of DME;Decreased knowledge of precautions;Pain       PT Treatment Interventions DME instruction;Gait training;Stair training;Functional mobility training;Therapeutic activities;Neuromuscular re-education;Patient/family education    PT Goals (Current goals can be found in the Care Plan section)  Acute Rehab PT Goals Patient Stated Goal: to decrease pain  PT Goal Formulation: With patient Time For Goal Achievement: 09/15/16 Potential to Achieve Goals: Good    Frequency Min 3X/week   Barriers to discharge        Co-evaluation               AM-PAC PT "6 Clicks" Daily Activity  Outcome Measure Difficulty turning over in bed (including adjusting bedclothes, sheets and blankets)?: A Little Difficulty moving from lying on back to sitting on the side of the bed? : A  Little Difficulty sitting down on and standing up from a chair with arms (e.g., wheelchair, bedside commode, etc,.)?: Total Help needed moving to and from a bed to chair (including a wheelchair)?: A Lot Help needed walking in hospital room?: A Lot Help needed climbing 3-5 steps with a railing? : Total 6 Click Score: 12    End of Session Equipment Utilized During Treatment: Gait belt Activity Tolerance: Patient limited by pain;Other (comment) (no weightbearing orders for RLE) Patient left: in bed;with call bell/phone within reach Nurse Communication: Mobility status PT Visit Diagnosis: Other abnormalities of gait and mobility (R26.89);Pain Pain - Right/Left: Right Pain - part of body: Hip;Ankle and joints of foot    Time: 1610-9604 PT Time Calculation (min) (ACUTE ONLY): 24 min   Charges:   PT Evaluation $PT Eval Moderate Complexity: 1 Procedure PT Treatments $Therapeutic Activity: 8-22 mins   PT G Codes:   PT G-Codes **NOT FOR INPATIENT CLASS** Functional Assessment Tool Used: AM-PAC 6 Clicks Basic Mobility;Clinical judgement Functional Limitation: Mobility: Walking and moving around Mobility: Walking and Moving Around Current Status (V4098): At least 60 percent but less than 80 percent impaired, limited or restricted Mobility: Walking and Moving Around Goal Status [phone]): At least 1 percent but less than 20 percent impaired, limited or restricted  Margot Chimes, PT, DPT  Acute Rehabilitation Services  Pager: [phone]   Melvyn Novas 09/01/2016, 11:56 AM

## 2016-09-01 NOTE — ED Notes (Signed)
Patient transported to X-ray 

## 2016-09-01 NOTE — Progress Notes (Signed)
PHARMACIST - PHYSICIAN COMMUNICATION  CONCERNING: P&T Medication Policy Regarding Oral Bisphosphonates  RECOMMENDATION: Your order for alendronate (Fosamax), ibandronate (Boniva), or risedronate (Actonel) has been discontinued at this time.  If the patient's post-hospital medical condition warrants safe use of this class of drugs, please resume the pre-hospital regimen upon discharge.  DESCRIPTION:  Alendronate (Fosamax), ibandronate (Boniva), and risedronate (Actonel) can cause severe esophageal erosions in patients who are unable to remain upright at least 30 minutes after taking this medication.   Since brief interruptions in therapy are thought to have minimal impact on bone mineral density, the Pharmacy & Therapeutics Committee has established that bisphosphonate orders should be routinely discontinued during hospitalization.   To override this safety policy and permit administration of Boniva, Fosamax, or Actonel in the hospital, prescribers must write "DO NOT HOLD" in the comments section when placing the order for this class of medications.  Link SnufferJessica Velinda Wrobel, PharmD, BCPS Clinical Pharmacist 09/01/2016, 4:04 PM

## 2016-09-01 NOTE — ED Notes (Signed)
RN informed PA that patient refusing to walk; states "no way with kind of pain I have"

## 2016-09-01 NOTE — ED Triage Notes (Addendum)
Pt arrives via [facility] for c/o right hip pain after a fall yesterday,. [facility]ms reports no loc, no blood thinners and no obvious deformity. Bruise present to forehead. Pt c/o n/v since her fall last night. Received 4mg  zofran and 350 cc ns pta. Pt a/ox4, nad.

## 2016-09-01 NOTE — H&P (Signed)
History and Physical    Kathryn [name]:[id number] DOB: [birth year] DOA: 09/01/2016  PCP: Clovis Riley, L.August Saucer, MD Patient coming from: Home  Chief Complaint: R hip pain  HPI: Kathryn [name] is a 66 y.o. female with medical history significant of alcoholic cirrhosis, right hip fracture status post replacement, CAD/MI, hyperlipidemia, pancreatitis, osteomyelitis of the left foot status post midfoot resection, PVD, hypertension, GERD.   Patient reports being in her normal state of health until approximately 04:00 on 08/31/2016 when she sustained a fall in her kitchen. Patient reports tingling through her kitchen when her left foot slipped causing her to fall on her right buttock and hip. Patient states this is a fairly common occurrence ever since her amputation of her left midfoot. Patient denies striking her head or any loss of consciousness. Immediately upon falling patient endorsed severe constant right hip and groin pain. Pain is worse with movement and is unrelieved with rest or narcotics. Patient states for the last day she has required significant help with performing her ADLs as she has had to do so from a wheelchair which is not her baseline. Patient states that pain became so severe on day of admission at 04:00 that she called EMS to come bring her to the emergency room. Patient endorses nausea that is worse with the pain but denies emesis. Patient does endorse one day history of diarrhea but denies hematochezia, melena, bright red blood per rectum, abdominal pain, fevers, chest pain, shortness of breath, palpitations, neck stiffness, focal neurological deficits. Patient aware that she's become dehydrated she's been able to keep down any liquids or solids for the past day.    ED Course: Objective findings outlined below. Case management and social work will consult by the ED physician to assist with patient placement or home health depending on further evaluations. PT has also been in to evaluate  patient already and is recommending SNF. Patient states that the morphine given in the ED has worked well for her pain.  Review of Systems: As per HPI otherwise all other systems reviewed and are negative  Ambulatory Status: Requiring cane or rolling walker prior to current fall and injury.  Past Medical History:  Diagnosis Date  . Alcoholic liver disease (HCC)   . Arthritis    "all over"  . Ascites 09/22/11   "first time for , now resolved  . Dysrhythmia    "irregular"  . Fracture, humerus 07/2011   right  . GERD (gastroesophageal reflux disease)   . High cholesterol   . History of blood transfusion 07/2011   "when I had elbow surgery"  . Hypertension    not current  . Myocardial infarction (HCC)    NSTEMI 12/2011 in setting of septic shock  . Osteomyelitis (HCC)    left foot  . Osteoporosis   . Pancreatitis several years ago  . Peripheral vascular disease (HCC)   . Staph infection    left foot    Past Surgical History:  Procedure Laterality Date  . AMPUTATION     two toes on left foot  . ARTHROSCOPIC REPAIR ACL     right knee  . BUNIONECTOMY     both feet  . ESOPHAGOGASTRODUODENOSCOPY  09/23/2011   Procedure: ESOPHAGOGASTRODUODENOSCOPY (EGD);  Surgeon: Shirley Friar, MD;  Location: [facility] ENDOSCOPY;  Service: Endoscopy;  Laterality: N/A;  . ESOPHAGOGASTRODUODENOSCOPY (EGD) WITH PROPOFOL N/A 08/22/2012   Procedure: ESOPHAGOGASTRODUODENOSCOPY (EGD) WITH PROPOFOL;  Surgeon: Willis Modena, MD;  Location: [facility] ENDOSCOPY;  Service: Endoscopy;  Laterality:  N/A;  . EUS N/A 03/25/2015   Procedure: UPPER ENDOSCOPIC ULTRASOUND (EUS) RADIAL;  Surgeon: Willis ModenaWilliam Outlaw, MD;  Location: [facility] ENDOSCOPY;  Service: Endoscopy;  Laterality: N/A;  . FINE NEEDLE ASPIRATION N/A 03/25/2015   Procedure: FINE NEEDLE ASPIRATION (FNA) RADIAL;  Surgeon: Willis ModenaWilliam Outlaw, MD;  Location: [facility] ENDOSCOPY;  Service: Endoscopy;  Laterality: N/A;  . FRACTURE SURGERY  2013   Right elbow  . JOINT REPLACEMENT      right hip   . LEG SURGERY Left    Correction of unequal leg length  . ORIF HUMERUS FRACTURE  07/26/2011   Procedure: OPEN REDUCTION INTERNAL FIXATION (ORIF) DISTAL HUMERUS FRACTURE;  Surgeon: Budd PalmerMichael H Handy, MD;  Location: [facility] OR;  Service: Orthopedics;  Laterality: Right;  . ORIF WRIST FRACTURE Left 12/26/2014   Procedure: OPEN REDUCTION INTERNAL FIXATION (ORIF) WRIST FRACTURE;  Surgeon: Cammy CopaScott Gregory Dean, MD;  Location: [facility] OR;  Service: Orthopedics;  Laterality: Left;  . PARACENTESIS  09/22/11   2.2L  . TOTAL HIP ARTHROPLASTY  ~ 2003   right  . TRANSMETATARSAL AMPUTATION  02/16/2012   Procedure: TRANSMETATARSAL AMPUTATION;  Surgeon: Nadara MustardMarcus V Duda, MD;  [facility];  Service: Orthopedics;  Laterality: Left;  . TUBAL LIGATION  1980    Social History   Social History  . Marital status: Widowed    Spouse name: N/A  . Number of children: N/A  . Years of education: N/A   Occupational History  . Not on file.   Social History Main Topics  . Smoking status: Never Smoker  . Smokeless tobacco: Never Used  . Alcohol use No     Comment: "last drink was day before OR in 07/2011"  . Drug use: No  . Sexual activity: Not Currently   Other Topics Concern  . Not on file   Social History Narrative   Widowed 2013   2 children in the area    Allergies  Allergen Reactions  . Codeine Hives    Causes blisters  . Tramadol Other (See Comments)    Loss of consciousness     Family History  Problem Relation Age of Onset  . Cancer Father        stomach, prostate, skin    Prior to Admission medications   Medication Sig Start Date End Date Taking? Authorizing Provider  alendronate (FOSAMAX) 70 MG tablet Take 70 mg by mouth once a week. Saturday 03/04/15  Yes [provider]  allopurinol (ZYLOPRIM) 100 MG tablet Take 100 mg by mouth 2 (two) times daily.   Yes [provider]  Calcium Carb-Cholecalciferol (CALCIUM 600 + D PO) Take 1 tablet by mouth 2 (two) times daily.    Yes [provider]  colchicine 0.6 MG tablet Take 0.6 mg by mouth 2 (two) times daily as needed (gout).   Yes [provider]  folic acid (FOLVITE) 400 MCG tablet Take 400 mcg by mouth daily.   Yes [provider]  lactulose (CHRONULAC) 10 GM/15ML solution Take 45 mLs (30 g total) by mouth 2 (two) times daily. Patient taking differently: Take 30 g by mouth 3 (three) times daily.  02/25/16  Yes Arrien, York RamMauricio Daniel, MD  mirtazapine (REMERON) 15 MG tablet Take 1 tablet (15 mg total) by mouth at bedtime. 03/26/12  Yes Ritch, Gasper LloydErik D, MD  potassium chloride SA (K-DUR,KLOR-CON) 20 MEQ tablet Take 20 mEq by mouth daily. 01/29/16  Yes [provider]  propranolol (INDERAL) 10 MG tablet Take 1 tablet (10 mg total)  by mouth 2 (two) times daily. 03/26/12  Yes Ritch, Gasper Lloyd, MD  spironolactone (ALDACTONE) 50 MG tablet Take 50 mg by mouth daily.   Yes [provider]  Thiamine HCl (VITAMIN B-1) 250 MG tablet Take 250 mg by mouth daily.   Yes [provider]  folic acid (FOLVITE) 1 MG tablet Take 1 tablet (1 mg total) by mouth daily. Patient not taking: Reported on 09/01/2016 04/20/12   Brent Bulla, MD  furosemide (LASIX) 40 MG tablet Take 1 tablet (40 mg total) by mouth daily. Patient not taking: Reported on 09/01/2016 12/26/14   Cammy Copa, MD  thiamine (VITAMIN B-1) 100 MG tablet Take 1 tablet (100 mg total) by mouth daily. Patient not taking: Reported on 09/01/2016 04/20/12   Brent Bulla, MD    Physical Exam: Vitals:   09/01/16 1230 09/01/16 1300 09/01/16 1400 09/01/16 1500  BP: 107/80 (!) 127/101 115/83 108/82  Pulse: 88 99 88 94  Resp: 13 (!) 21 17 17   Temp:      TempSrc:      SpO2: 92% 100% 94% 98%     General:  Appears calm and comfortable Eyes:  PERRL, EOMI, normal lids, iris ENT:  grossly normal hearing, lips & tongue, mmm Neck:  no LAD, masses or thyromegaly Cardiovascular:  RRR, no m/r/g. No LE edema.  Respiratory:  CTA  bilaterally, no w/r/r. Normal respiratory effort. Abdomen:  soft, ntnd, NABS Skin:  no rash or induration seen on limited exam Musculoskeletal: Right hip point tenderness of the tissue overlying what would be the greater trochanter. Decreased movement of same limited secondary to pain. Left foot mid foot amputation appreciated. Psychiatric:  grossly normal mood and affect, speech fluent and appropriate, AOx3 Neurologic:  CN 2-12 grossly intact, moves all extremities in coordinated fashion, sensation intact  Labs on Admission: I have personally reviewed following labs and imaging studies  CBC:  Recent Labs Lab 09/01/16 0509  WBC 3.7*  NEUTROABS 2.6  HGB 12.2  HCT 37.6  MCV 94.9  PLT 106*   Basic Metabolic Panel:  Recent Labs Lab 09/01/16 0509  NA 138  K 3.1*  CL 97*  CO2 28  GLUCOSE 133*  BUN 10  CREATININE 0.90  CALCIUM 10.4*   GFR: CrCl cannot be calculated (Unknown ideal weight.). Liver Function Tests:  Recent Labs Lab 09/01/16 0509  AST 33  ALT 13*  ALKPHOS 137*  BILITOT 1.6*  PROT 6.7  ALBUMIN 3.2*    Recent Labs Lab 09/01/16 0509  LIPASE 73*   No results for input(s): AMMONIA in the last 168 hours. Coagulation Profile: No results for input(s): INR, PROTIME in the last 168 hours. Cardiac Enzymes: No results for input(s): CKTOTAL, CKMB, CKMBINDEX, TROPONINI in the last 168 hours. BNP (last 3 results) No results for input(s): PROBNP in the last 8760 hours. HbA1C: No results for input(s): HGBA1C in the last 72 hours. CBG: No results for input(s): GLUCAP in the last 168 hours. Lipid Profile: No results for input(s): CHOL, HDL, LDLCALC, TRIG, CHOLHDL, LDLDIRECT in the last 72 hours. Thyroid Function Tests: No results for input(s): TSH, T4TOTAL, FREET4, T3FREE, THYROIDAB in the last 72 hours. Anemia Panel: No results for input(s): VITAMINB12, FOLATE, FERRITIN, TIBC, IRON, RETICCTPCT in the last 72 hours. Urine analysis:    Component Value  Date/Time   COLORURINE YELLOW 02/18/2016 1224   APPEARANCEUR CLOUDY (A) 02/18/2016 1224   LABSPEC 1.008 02/18/2016 1224   PHURINE 5.5 02/18/2016 1224   GLUCOSEU NEGATIVE  02/18/2016 1224   GLUCOSEU NEG mg/dL 16/01/9603 5409   HGBUR NEGATIVE 02/18/2016 1224   HGBUR negative 03/17/2008 1240   BILIRUBINUR NEGATIVE 02/18/2016 1224   KETONESUR NEGATIVE 02/18/2016 1224   PROTEINUR NEGATIVE 02/18/2016 1224   UROBILINOGEN 0.2 12/30/2013 1251   NITRITE NEGATIVE 02/18/2016 1224   LEUKOCYTESUR SMALL (A) 02/18/2016 1224    Creatinine Clearance: CrCl cannot be calculated (Unknown ideal weight.).  Sepsis Labs: @LABRCNTIP (procalcitonin:4,lacticidven:4) )No results found for this or any previous visit (from the past 240 hour(s)).   Radiological Exams on Admission: Dg Chest 2 View  Result Date: 09/01/2016 CLINICAL DATA:  Nausea and vomiting. History of hypertension and MI. Hip pain. Fall. EXAM: CHEST  2 VIEW COMPARISON:  02/18/2016.  03/26/2015. FINDINGS: Mediastinum and hilar structures are normal. Stable cardiomegaly. Normal pulmonary vascularity. Mild left base pleuroparenchymal thickening consistent scarring again noted. No acute infiltrate. No pleural effusion or pneumothorax. Old left rib fractures are noted. Mild lower thoracic vertebral body compression fractures, age undetermined. Diffuse osteopenia degenerative change. IMPRESSION: 1. Mild left base pleural-parenchymal thickening consistent scarring again noted . 2. Mild lower thoracic vertebral body compression fractures, age undetermined. Diffuse osteopenia and degenerative change. Electronically Signed   By: Maisie Fus  Register   On: 09/01/2016 06:14   Dg Forearm Left  Result Date: 09/01/2016 CLINICAL DATA:  Pain following fall EXAM: LEFT FOREARM - 2 VIEW COMPARISON:  None. FINDINGS: Frontal and lateral views were obtained. There is postoperative change in the distal radius with mild bony remodeling from prior radial fracture. No acute fracture  or dislocation is evident. There is mild narrowing of the radiocarpal joint. No erosive change. IMPRESSION: Postoperative change in the distal radial region with remodeling in this area. No acute fracture or dislocation. Mild narrowing radiocarpal joint. Electronically Signed   By: Bretta Bang III M.D.   On: 09/01/2016 08:13   Ct Head Wo Contrast  Result Date: 09/01/2016 CLINICAL DATA:  Larey Seat yesterday striking the front of the head. Subsequent headache. EXAM: CT HEAD WITHOUT CONTRAST TECHNIQUE: Contiguous axial images were obtained from the base of the skull through the vertex without intravenous contrast. COMPARISON:  02/18/2016 FINDINGS: Brain: Generalized brain atrophy without evidence of old or acute focal infarction, mass lesion, hemorrhage, hydrocephalus or extra-axial collection. Vascular: No abnormal vascular finding. Skull: No skull fracture or skull lesion. Sinuses/Orbits: Chronic inflammatory changes of the left division of the sphenoid sinus, more extensive than present on the previous study. Other sinuses are clear. Orbits are negative. Other: None significant IMPRESSION: No traumatic finding. No cause of acute headache identified. Age related atrophy. The patient does have inflammatory disease of the left division of the sphenoid sinus, progressive since November of last year. This could be associated with headache. Electronically Signed   By: Paulina Fusi M.D.   On: 09/01/2016 08:01   Ct Pelvis Wo Contrast  Result Date: 09/01/2016 CLINICAL DATA:  Larey Seat yesterday. Right hip pain. Unable to bear weight. EXAM: CT PELVIS WITHOUT CONTRAST TECHNIQUE: Multidetector CT imaging of the pelvis was performed following the standard protocol without intravenous contrast. COMPARISON:  03/26/2015 FINDINGS: Previous right hip arthroplasty. Components remain well positioned. No evidence of fracture or dislocation. Old rami and symphysis pubis fractures on the left as seen previously, completely healed. No  evidence of sacral fracture. Chronic lumbar degenerative disc disease and facet disease. No evidence of soft tissue hematoma or injury. Uterus and adnexal regions appear normal. No free fluid in the pelvis. No acute bowel finding. Bladder appears unremarkable. IMPRESSION: No acute  or traumatic finding. No abnormal finding relative to the right hip replacement. No evidence of acute fracture. Old healed fractures of the left pelvis. No definable soft tissue injury. Electronically Signed   By: Paulina Fusi M.D.   On: 09/01/2016 08:05   Dg Toe Great Right  Result Date: 09/01/2016 CLINICAL DATA:  Patient hit toe on solid object EXAM: RIGHT GREAT TOE COMPARISON:  None. FINDINGS: Frontal, oblique, and lateral views were obtained. There is semi avulsion type fracture along the medial aspect the proximal portion of the first proximal phalanx. Alignment near anatomic. No other fracture. No dislocation. Joint spaces appear normal. Patient has had a previous bunionectomy on the right. IMPRESSION: Avulsion type fracture along the medial proximal aspect of the first proximal phalanx. Alignment near anatomic. No other fracture. No dislocation. Previous bunionectomy. Joint spaces unremarkable. Electronically Signed   By: Bretta Bang III M.D.   On: 09/01/2016 08:15   Dg Hip Unilat With Pelvis 2-3 Views Right  Result Date: 09/01/2016 CLINICAL DATA:  Right hip pain after fall EXAM: DG HIP (WITH OR WITHOUT PELVIS) 2-3V RIGHT COMPARISON:  Right hip radiograph 09/29/2014 FINDINGS: There is a right total hip arthroplasty without adjacent lucency or periprosthetic fracture. Chronic deformity of the left superior and inferior pubic rami is unchanged. Limited views of the left hip are normal. IMPRESSION: No acute abnormality of the right hip or right total hip arthroplasty hardware. Electronically Signed   By: Deatra Robinson M.D.   On: 09/01/2016 06:11     Assessment/Plan Active Problems:   Essential hypertension   GERD    Alcoholic liver disease (HCC)   Hypokalemia   Intractable pain   Nausea vomiting and diarrhea   Intractable pain: Sustained after mechanical fall onto right hip. Patient is status post right hip replacement. Hardware intact per review of multiple imaging studies. Patient seen by PT in the ED who is recommending SNF placement she is unsafe for discharge at this time. Warfarin working well for patient in the ED. - Morphine IV, Toradol IV, oxyc 100 and never Delton See when he is over the left lesion observations are happy to odone by mouth - Continue PT/OT - Social work and case management consult by EDP who are assisting with either SNF placement or home health. Anticipating discharge on 09/02/2016. - CK  Nausea, vomiting, diarrhea: Suspect secondary to severe pain but may also be due to concurrent viral gastroenteritis. Unable to tolerate oral medications at this time. Improved after treatment in the ED. AF VSS. No abdominal pain. Normal white count. Doubt severe infectious - Zofran, phenergan - treatment of pain as above - IVF - Lipase - Clear liquid diet, ADAT  HTN: well controlled on propranolol - Hydralazine prn  HypoK: 3.1 on admission.  - Replete, Mag  ETOH induced Cirrhosis: at baseline. No ascites. No ETOH for 2 years.  - continue lactulose, vitamins, lasix, spironolactone, propranolol  Gout: - continue allopurinol  Depresison: - continue remeron    DVT prophylaxis: lovenox  Code Status: full  Family Communication: none  Disposition Plan: pending improvement in pain n/v and placement  Consults called: pt/ot/social work,/  Admission status: observation    Tyce Delcid J MD [facility]  If 7PM-7AM, please contact night-coverage [email or web address] Password [email or web address]  09/01/2016, 3:33 PM

## 2016-09-01 NOTE — ED Notes (Signed)
PT at bedside currently.

## 2016-09-01 NOTE — Progress Notes (Signed)
Physical Therapy Treatment Patient Details Name: Kathryn [name] MRN: [id number] DOB: 09/04/1950 Today's Date: 09/01/2016    History of Present Illness Pt is a 66 y/o female admitted secondary to increased pain in RLE after fall. Xray reports showed an avulsion fx of R 1st proximal phalanx, however, all other X rays are negative. No PMH on file.     PT Comments    Pt weightbearing orders updated to FWB on RLE. Attempted gait training, however, pt extremely limited by pain and decreased balance this session. Pt requiring min A for steadying with use of RW and only able to take a couple steps secondary to pain. Pt currently unsafe to go home given current mobility limitations. Pt having to go up 3 stairs to enter house, and would be unable to do so safely at this time. Recommendations updated to SNF at d/c to increase independence with functional mobility. Will continue to follow and progress mobility according to pt tolerance.   Follow Up Recommendations  SNF     Equipment Recommendations  Rolling walker with 5" wheels    Recommendations for Other Services       Precautions / Restrictions Precautions Precautions: Fall Restrictions Weight Bearing Restrictions: No Other Position/Activity Restrictions: FWB per order     Mobility  Bed Mobility Overal bed mobility: Modified Independent             General bed mobility comments: Required increased time but no assist.   Transfers Overall transfer level: Needs assistance Equipment used: Rolling walker (2 wheeled) Transfers: Sit to/from Stand Sit to Stand: Min guard         General transfer comment: Min guard for safety. Pt reports increased pain with transfer given FWB on RLE.   Ambulation/Gait Ambulation/Gait assistance: Min assist Ambulation Distance (Feet): 1 Feet Assistive device: Rolling walker (2 wheeled) Gait Pattern/deviations: Step-to pattern;Decreased weight shift to right;Antalgic;Trunk flexed Gait velocity:  Decreased Gait velocity interpretation: Below normal speed for age/gender General Gait Details: Slow, antalgic, unsteady gait secondary to RLE pain. Pt extremely limited by pain and unable to complete gait training further than a couple of steps. Pt with nausea/vomiting secondary to pain during ambulation. Verbal cues to press through BUE to offload RLE. Min A for steadying throughout gait.    Stairs            Wheelchair Mobility    Modified Rankin (Stroke Patients Only)       Balance Overall balance assessment: Needs assistance Sitting-balance support: No upper extremity supported;Feet unsupported Sitting balance-Leahy Scale: Good     Standing balance support: Bilateral upper extremity supported Standing balance-Leahy Scale: Poor Standing balance comment: Reliant on RW for mobility.                             Cognition Arousal/Alertness: Awake/alert Behavior During Therapy: WFL for tasks assessed/performed Overall Cognitive Status: Within Functional Limits for tasks assessed                                        Exercises      General Comments General comments (skin integrity, edema, etc.): Order updated to FWB on RLE. Pt with increased pain with mobility and unable to complete extensive mobility. Pt reporting she does not feel safe to go home.       Pertinent Vitals/Pain Pain Assessment: Faces Pain Score:  7  Faces Pain Scale: Hurts worst Pain Location: R foot, hip  Pain Descriptors / Indicators: Sharp;Sore Pain Intervention(s): Limited activity within patient's tolerance;Monitored during session;Repositioned    Home Living Family/patient expects to be discharged to:: Private residence Living Arrangements: Spouse/significant other Available Help at Discharge: Family;Available 24 hours/day Type of Home: House Home Access: Stairs to enter Entrance Stairs-Rails: Left Home Layout: One level Home Equipment: Walker - 4  wheels;Wheelchair - manual;Toilet riser;Cane - single point;Crutches;Grab bars - tub/shower;Shower seat Additional Comments: Significant other helps with mobility at home when necessary.     Prior Function Level of Independence: Independent with assistive device(s)      Comments: Has had mutiple falls, last one being on Wednesday. Significant other had to help her. Was using Rollator at baseline, but after fall was using WC secondary to pain.    PT Goals (current goals can now be found in the care plan section) Acute Rehab PT Goals Patient Stated Goal: to decrease pain  PT Goal Formulation: With patient Time For Goal Achievement: 09/15/16 Potential to Achieve Goals: Fair Progress towards PT goals: Progressing toward goals    Frequency    Min 2X/week      PT Plan Discharge plan needs to be updated;Frequency needs to be updated    Co-evaluation              AM-PAC PT "6 Clicks" Daily Activity  Outcome Measure  Difficulty turning over in bed (including adjusting bedclothes, sheets and blankets)?: A Little Difficulty moving from lying on back to sitting on the side of the bed? : A Little Difficulty sitting down on and standing up from a chair with arms (e.g., wheelchair, bedside commode, etc,.)?: Total Help needed moving to and from a bed to chair (including a wheelchair)?: A Lot Help needed walking in hospital room?: A Lot Help needed climbing 3-5 steps with a railing? : Total 6 Click Score: 12    End of Session Equipment Utilized During Treatment: Gait belt Activity Tolerance: Patient limited by pain;Treatment limited secondary to medical complications (Comment) (nausea/vomiting ) Patient left: in bed;with call bell/phone within reach Nurse Communication: Mobility status PT Visit Diagnosis: Other abnormalities of gait and mobility (R26.89);Pain Pain - Right/Left: Right Pain - part of body: Hip;Ankle and joints of foot     Time: [id number] PT Time Calculation (min)  (ACUTE ONLY): 12 min  Charges:  $Gait Training: 8-22 mins $Therapeutic Activity: 8-22 mins                    G Codes:  Functional Assessment Tool Used: AM-PAC 6 Clicks Basic Mobility;Clinical judgement Functional Limitation: Mobility: Walking and moving around Mobility: Walking and Moving Around Current Status ([id number]): At least 60 percent but less than 80 percent impaired, limited or restricted Mobility: Walking and Moving Around Goal Status [phone]([id number]): At least 1 percent but less than 20 percent impaired, limited or restricted    Margot ChimesBrittany Smith, PT, DPT  Acute Rehabilitation Services  Pager: [phone]    Melvyn NovasBrittany L Smith 09/01/2016, 2:04 PM

## 2016-09-01 NOTE — Progress Notes (Signed)
CRITICAL VALUE ALERT  Critical value received:  MG 0.8  Date of notification:  09/01/16  Time of notification: 17:57  Critical value read back:YES  [facility]  MD notified (1st page):  DR MERRELL  Time of first page:  18:00  MD notified (2nd page):  Time of second page:  Responding MD:    Time MD responded:

## 2016-09-01 NOTE — ED Notes (Signed)
PT to CT and XRAY; RN introduced herself. Pt appears to be in no distress; conversative and pleasant.

## 2016-09-01 NOTE — ED Notes (Signed)
Pt on phone; no distress

## 2016-09-02 DIAGNOSIS — K709 Alcoholic liver disease, unspecified: Secondary | ICD-10-CM | POA: Diagnosis not present

## 2016-09-02 DIAGNOSIS — S0083XA Contusion of other part of head, initial encounter: Secondary | ICD-10-CM | POA: Diagnosis not present

## 2016-09-02 DIAGNOSIS — K7030 Alcoholic cirrhosis of liver without ascites: Secondary | ICD-10-CM | POA: Diagnosis not present

## 2016-09-02 DIAGNOSIS — D649 Anemia, unspecified: Secondary | ICD-10-CM | POA: Diagnosis not present

## 2016-09-02 DIAGNOSIS — K219 Gastro-esophageal reflux disease without esophagitis: Secondary | ICD-10-CM | POA: Diagnosis not present

## 2016-09-02 DIAGNOSIS — I119 Hypertensive heart disease without heart failure: Secondary | ICD-10-CM | POA: Diagnosis not present

## 2016-09-02 DIAGNOSIS — I10 Essential (primary) hypertension: Secondary | ICD-10-CM

## 2016-09-02 DIAGNOSIS — M810 Age-related osteoporosis without current pathological fracture: Secondary | ICD-10-CM | POA: Diagnosis not present

## 2016-09-02 DIAGNOSIS — M25551 Pain in right hip: Secondary | ICD-10-CM | POA: Diagnosis not present

## 2016-09-02 DIAGNOSIS — E876 Hypokalemia: Secondary | ICD-10-CM

## 2016-09-02 DIAGNOSIS — I739 Peripheral vascular disease, unspecified: Secondary | ICD-10-CM | POA: Diagnosis not present

## 2016-09-02 DIAGNOSIS — I252 Old myocardial infarction: Secondary | ICD-10-CM | POA: Diagnosis not present

## 2016-09-02 DIAGNOSIS — F1020 Alcohol dependence, uncomplicated: Secondary | ICD-10-CM | POA: Diagnosis not present

## 2016-09-02 LAB — URINALYSIS, ROUTINE W REFLEX MICROSCOPIC
BILIRUBIN URINE: NEGATIVE
Glucose, UA: NEGATIVE mg/dL
Hgb urine dipstick: NEGATIVE
Ketones, ur: NEGATIVE mg/dL
NITRITE: NEGATIVE
PH: 5 (ref 5.0–8.0)
Protein, ur: NEGATIVE mg/dL
SPECIFIC GRAVITY, URINE: 1.024 (ref 1.005–1.030)

## 2016-09-02 LAB — COMPREHENSIVE METABOLIC PANEL
ALBUMIN: 3 g/dL — AB (ref 3.5–5.0)
ALT: 11 U/L — ABNORMAL LOW (ref 14–54)
ANION GAP: 10 (ref 5–15)
AST: 24 U/L (ref 15–41)
Alkaline Phosphatase: 108 U/L (ref 38–126)
BUN: 8 mg/dL (ref 6–20)
CHLORIDE: 99 mmol/L — AB (ref 101–111)
CO2: 26 mmol/L (ref 22–32)
Calcium: 8.7 mg/dL — ABNORMAL LOW (ref 8.9–10.3)
Creatinine, Ser: 0.9 mg/dL (ref 0.44–1.00)
GFR calc Af Amer: >60 mL/min (ref >60)
GFR calc non Af Amer: >60 mL/min (ref >60)
GLUCOSE: 94 mg/dL (ref 65–99)
POTASSIUM: 3.1 mmol/L — AB (ref 3.5–5.1)
SODIUM: 135 mmol/L (ref 135–145)
Total Bilirubin: 1.4 mg/dL — ABNORMAL HIGH (ref 0.3–1.2)
Total Protein: 6.1 g/dL — ABNORMAL LOW (ref 6.5–8.1)

## 2016-09-02 LAB — CBC
HEMATOCRIT: 37.6 % (ref 36.0–46.0)
Hemoglobin: 11.9 g/dL — ABNORMAL LOW (ref 12.0–15.0)
MCH: 31 pg (ref 26.0–34.0)
MCHC: 31.6 g/dL (ref 30.0–36.0)
MCV: 97.9 fL (ref 78.0–100.0)
PLATELETS: 94 10*3/uL — AB (ref 150–400)
RBC: 3.84 MIL/uL — ABNORMAL LOW (ref 3.87–5.11)
RDW: 13.7 % (ref 11.5–15.5)
WBC: 3.6 10*3/uL — AB (ref 4.0–10.5)

## 2016-09-02 LAB — HIV ANTIBODY (ROUTINE TESTING W REFLEX): HIV Screen 4th Generation wRfx: NONREACTIVE

## 2016-09-02 LAB — MAGNESIUM: Magnesium: 2.6 mg/dL — ABNORMAL HIGH (ref 1.7–2.4)

## 2016-09-02 MED ORDER — MORPHINE SULFATE (PF) 4 MG/ML IV SOLN
1.0000 mg | INTRAVENOUS | Status: DC | PRN
  Administered 2016-09-02: 1 mg via INTRAVENOUS
  Administered 2016-09-02: 2 mg via INTRAVENOUS
  Filled 2016-09-02 (×3): qty 1

## 2016-09-02 MED ORDER — PROPRANOLOL HCL 10 MG PO TABS
10.0000 mg | ORAL_TABLET | Freq: Two times a day (BID) | ORAL | Status: DC
  Administered 2016-09-03: 10 mg via ORAL
  Filled 2016-09-02 (×2): qty 1

## 2016-09-02 MED ORDER — OXYCODONE HCL 5 MG PO TABS
5.0000 mg | ORAL_TABLET | Freq: Four times a day (QID) | ORAL | Status: DC | PRN
  Administered 2016-09-02 – 2016-09-04 (×7): 5 mg via ORAL
  Filled 2016-09-02 (×7): qty 1

## 2016-09-02 MED ORDER — POTASSIUM CHLORIDE CRYS ER 20 MEQ PO TBCR
40.0000 meq | EXTENDED_RELEASE_TABLET | Freq: Once | ORAL | Status: AC
  Administered 2016-09-02: 40 meq via ORAL
  Filled 2016-09-02: qty 2

## 2016-09-02 MED ORDER — ALUM & MAG HYDROXIDE-SIMETH 200-200-20 MG/5ML PO SUSP
15.0000 mL | ORAL | Status: DC | PRN
  Administered 2016-09-02 – 2016-09-04 (×5): 15 mL via ORAL
  Filled 2016-09-02 (×5): qty 30

## 2016-09-02 NOTE — Progress Notes (Signed)
Occupational Therapy Evaluation Patient Details Name: Kathryn [name] MRN: [id number] DOB: [birth year] Today's Date: 09/02/2016    History of Present Illness Pt is a 66 y/o female admitted secondary to increased pain in RLE after fall. Xray reports showed an avulsion fx of R 1st proximal phalanx, however, all other X rays are negative. medical history significant of alcoholic cirrhosis, right hip fracture status post replacement, CAD/MI, hyperlipidemia, pancreatitis, osteomyelitis of the left foot status post midfoot resection, PVD, hypertension, GERD.    Clinical Impression   PTA, ptlived at home with her significant other. Pt states she was independent with mobility and ADL but has a history tof multiple falls since amputation of her L midfoot. Feel pt will benefit form rehab at SNF to facilitate safe return home. Will follow acutely to maximize functional level of independence. Reccommend discussing use of postop shoe or cam boot with ortho to reduce pain during mobility. Pt kept NWB during eval.    Follow Up Recommendations  SNF;Supervision/Assistance - 24 hour    Equipment Recommendations  None recommended by OT    Recommendations for Other Services       Precautions / Restrictions Precautions Precautions: Fall Restrictions Weight Bearing Restrictions: No Other Position/Activity Restrictions: FWB per order       Mobility Bed Mobility Overal bed mobility: Needs Assistance Bed Mobility: Supine to Sit     Supine to sit: Supervision     General bed mobility comments: increased time and effort  Transfers Overall transfer level: Needs assistance Equipment used: Rolling walker (2 wheeled) Transfers: Sit to/from [employer] CorporationStand;Stand Pivot Transfers Sit to Stand: Min assist Stand pivot transfers: Min assist       General transfer comment: increased time, good technique, min guard with rise into standing from bed, min A for stability with pivotal movements to chair. Pt refusing to put  any weight on R LE at this time, preferring to pivot on L LE only    Balance Overall balance assessment: Needs assistance;History of Falls Sitting-balance support: No upper extremity supported;Feet unsupported Sitting balance-Leahy Scale: Good     Standing balance support: Bilateral upper extremity supported Standing balance-Leahy Scale: Poor Standing balance comment: Reliant on RW for mobility.                            ADL either performed or assessed with clinical judgement   ADL Overall ADL's : Needs assistance/impaired Eating/Feeding: Modified independent   Grooming: Set up;Sitting   Upper Body Bathing: Set up;Sitting   Lower Body Bathing: Minimal assistance;Sitting/lateral leans   Upper Body Dressing : Set up;Sitting   Lower Body Dressing: Minimal assistance;Sitting/lateral leans   Toilet Transfer: Minimal assistance;RW;BSC;Stand-pivot           Functional mobility during ADLs: Minimal assistance;Rolling walker       Vision Baseline Vision/History: Wears glasses Wears Glasses: At all times       Perception     Praxis      Pertinent Vitals/Pain Pain Assessment: Faces Faces Pain Scale: Hurts even more Pain Location: R foot, hip  Pain Descriptors / Indicators: Sore Pain Intervention(s): Limited activity within patient's tolerance     Hand Dominance Right   Extremity/Trunk Assessment Upper Extremity Assessment Upper Extremity Assessment: Overall WFL for tasks assessed   Lower Extremity Assessment Lower Extremity Assessment: Defer to PT evaluation RLE Deficits / Details: Avulsion fx of R proximal phalanx. Maintained NWB until orders clarified. Grossly 3/5 throughout RLE LLE Deficits /  Details: Partial amputee on L foot. Generalized weakness; grossly 3/5 throughout.    Cervical / Trunk Assessment Cervical / Trunk Assessment: Kyphotic   Communication Communication Communication: No difficulties   Cognition Arousal/Alertness:  Awake/alert Behavior During Therapy: WFL for tasks assessed/performed Overall Cognitive Status: Within Functional Limits for tasks assessed                                     General Comments       Exercises     Shoulder Instructions      Home Living Family/patient expects to be discharged to:: Skilled nursing facility Living Arrangements: Spouse/significant other Available Help at Discharge: Family;Available 24 hours/day Type of Home: House Home Access: Stairs to enter [employer] of Steps: 3 Entrance Stairs-Rails: Left Home Layout: One level     Bathroom Shower/Tub: Producer, television/film/video: Handicapped height     Home Equipment: Environmental consultant - 4 wheels;Wheelchair - manual;Toilet riser;Cane - single point;Crutches;Grab bars - tub/shower;Shower seat   Additional Comments: Significant other helps with mobility at home when necessary.       Prior Functioning/Environment Level of Independence: Independent with assistive device(s)        Comments: Has had mutiple falls, last one being on Wednesday. Significant other had to help her. Was using Rollator at baseline, but after fall was using WC secondary to pain.         OT Problem List: Decreased strength;Decreased range of motion;Decreased activity tolerance;Impaired balance (sitting and/or standing);Decreased safety awareness;Decreased knowledge of use of DME or AE;Impaired sensation;Pain      OT Treatment/Interventions: Self-care/ADL training;Therapeutic exercise;Energy conservation;DME and/or AE instruction;Therapeutic activities;Patient/family education;Balance training    OT Goals(Current goals can be found in the care plan section) Acute Rehab OT Goals Patient Stated Goal: to stop falling OT Goal Formulation: With patient Time For Goal Achievement: 09/16/16 Potential to Achieve Goals: Good ADL Goals Pt Will Perform Lower Body Bathing: with supervision;sit to/from stand Pt Will  Transfer to Toilet: with supervision;bedside commode;ambulating Pt Will Perform Toileting - Clothing Manipulation and hygiene: with supervision;sitting/lateral leans  OT Frequency: Min 2X/week   Barriers to D/C: Decreased caregiver support          Co-evaluation              AM-PAC PT "6 Clicks" Daily Activity     Outcome Measure Help from another person eating meals?: None Help from another person taking care of personal grooming?: None Help from another person toileting, which includes using toliet, bedpan, or urinal?: A Little Help from another person bathing (including washing, rinsing, drying)?: A Little Help from another person to put on and taking off regular upper body clothing?: A Little Help from another person to put on and taking off regular lower body clothing?: A Little 6 Click Score: 20   End of Session Equipment Utilized During Treatment: Rolling walker Nurse Communication: Mobility status  Activity Tolerance: Patient tolerated treatment well Patient left: in bed;with call bell/phone within reach  OT Visit Diagnosis: Unsteadiness on feet (R26.81);Repeated falls (R29.6);Muscle weakness (generalized) (M62.81);Pain Pain - Right/Left: Right Pain - part of body: Hip;Ankle and joints of foot                Time: 8119-1478 OT Time Calculation (min): 10 min Charges:  OT General Charges $OT Visit: 1 Procedure OT Evaluation $OT Eval Moderate Complexity: 1 Procedure G-Codes: OT G-codes **NOT FOR INPATIENT  CLASS** Functional Assessment Tool Used: Clinical judgement Functional Limitation: Self care Self Care Current Status (M5784): At least 20 percent but less than 40 percent impaired, limited or restricted Self Care Goal Status (O9629): At least 1 percent but less than 20 percent impaired, limited or restricted   [facility], OT/L  [phone] 09/02/2016  Toye Rouillard,HILLARY 09/02/2016, 2:38 PM

## 2016-09-02 NOTE — Progress Notes (Signed)
Initial Nutrition Assessment  DOCUMENTATION CODES:   Not applicable  INTERVENTION:   -Continue Boost Breeze po TID, each supplement provides 250 kcal and 9 grams of protein  NUTRITION DIAGNOSIS:   Inadequate oral intake related to poor appetite as evidenced by per patient/family report.  GOAL:   Patient will meet greater than or equal to 90% of their needs  MONITOR:   PO intake, Supplement acceptance, Labs, Weight trends, Skin, I & O's  REASON FOR ASSESSMENT:   Malnutrition Screening Tool    ASSESSMENT:   Kathryn [name] Avera is a 66 y.o. female with medical history significant of alcoholic cirrhosis, right hip fracture status post replacement, CAD/MI, hyperlipidemia, pancreatitis, osteomyelitis of the left foot status post midfoot resection, PVD, hypertension, GERD.   Pt admitted with alcoholic cirrhosis, intractable pain, and n/v/d.   Spoke with pt, who reports chronically poor oral intake. She shares that she recently started consuming 6 meals per day to optimize intake (meals include half of a chicken sandwich). She also started consuming Boost Breeze supplements BID and is faithful about drinking them. She shares that she was unable to keep anything down for two days PTA related to pain.  Pt endorses wt loss, however, wt has been stable over the past several years.   Nutrition-Focused physical exam completed. Findings are mild fat depletion, no muscle depletion, and no edema.   Medications reviewed folvite, K-Dur, calcium plus vitamin D, folvite, remeron, vitamin [name]-1.  Discussed importance of good meal and supplement consumption to promote healing. Pt tolerated clear liquid diet well this AM and is looking forward to eating solid food for lunch. Encouraged pt to continue Boost Breeze supplements both in the hospital and after discharge.   Labs reviewed: K: 3.1 (on PO supplementation).   Diet Order:  Diet 2 gram sodium Room service appropriate? Yes; Fluid consistency:  Thin  Skin:  Reviewed, no issues  Last BM:  09/02/16  Height:   Ht Readings from Last 1 Encounters:  09/01/16 5\' 1"  (1.549 m)    Weight:   Wt Readings from Last 1 Encounters:  09/01/16 105 lb (47.6 kg)    Ideal Body Weight:  47.7 kg  BMI:  Body mass index is 19.84 kg/m.  Estimated Nutritional Needs:   Kcal:  1400-1600  Protein:  65-80 grams  Fluid:  1.4-1.6 L  EDUCATION NEEDS:   Education needs addressed  Kathryn [name], RD, LDN, CDE Pager: [phone] After hours Pager: [phone]

## 2016-09-02 NOTE — Clinical Social Work Note (Signed)
Clinical Social Work Assessment  Patient Details  Name: Kathryn [name] MRN: [id number] Date of Birth: 09/12/1950  Date of referral:  09/02/16               Reason for consult:  Facility Placement                Permission sought to share information with:  Facility Industrial/product designerContact Representative Permission granted to share information::  Yes, Verbal Permission Granted  Name::        Agency::  SNFs  Relationship::     Contact Information:     Housing/Transportation Living arrangements for the past 2 months:  Single Family Home Source of Information:  Patient Patient Interpreter Needed:  None Criminal Activity/Legal Involvement Pertinent to Current Situation/Hospitalization:  No - Comment as needed Significant Relationships:  Significant Other Lives with:  Significant Other Do you feel safe going back to the place where you live?  No Need for family participation in patient care:  No (Coment)  Care giving concerns:  CSW received consult for possible SNF placement at time of discharge. CSW spoke with patient regarding PT recommendation of SNF placement. Patient reported that she continues to have significant pain and is unable to ambulate without assistance. Patient is agreeable to SNF placemnt.    Social Worker assessment / plan:  CS[facility] and reviewed possible SNF placement options. CSW to continue to follow and assist with discharge planning needs.  Employment status:  Disabled (Comment on whether or not currently receiving Disability) Insurance information:  Managed Medicare PT Recommendations:  Skilled Nursing Facility Information / Referral to community resources:  Skilled Nursing Facility  Patient/Family's Response to care: Patient recognizes the need for rehab before retuning home and [place] [facility]   Patient/Family's Understanding of and  Emotional Response to Diagnosis, Current Treatment, and Prognosis:  Patient is realistic regarding therapy needs and expressed being hopeful for SNF placement. Patient reported significant pain and was unsure of ability to discharge before pain is controlled. Patient expressed understanding of CSW role and discharge process.    Emotional Assessment Appearance:  Appears stated age Attitude/Demeanor/Rapport:  Other (appropriate) Affect (typically observed):  Appropriate, Calm Orientation:  Oriented to Self, Oriented to Place, Oriented to  Time, Oriented to Situation Alcohol / Substance use:  Not Applicable Psych involvement (Current and /or in the community):  No (Comment)  Discharge Needs  Concerns to be addressed:  Care Coordination, Discharge Planning Concerns Readmission within the last 30 days:  No Current discharge risk:  Dependent with Mobility Barriers to Discharge:  No Barriers Identified   Abigail ButtsSusan Lemya Greenwell, LCSW 09/02/2016, 10:56 AM

## 2016-09-02 NOTE — NC FL2 (Signed)
[place] MEDICAID FL2 LEVEL OF CARE SCREENING TOOL     IDENTIFICATION  Patient Name: Kathryn [name] Birthdate: 11/11/1950 Sex: female Admission Date (Current Location): 09/01/2016  [facility] [place]   Facility and Address:  [facility], [address], [place], [place] [address]      Provider Number: [id number]  Attending Physician Name and Address:  Osvaldo ShipperKrishnan, Gokul, MD  Relative Name and Phone Number:       Current Level of Care: Hospital Recommended Level of Care: Skilled Nursing Facility Prior Approval Number:    Date Approved/Denied:   PASRR Number:  [id number])  Discharge Plan: SNF    Current Diagnoses: Patient Active Problem List   Diagnosis Date Noted  . Intractable pain 09/01/2016  . Nausea vomiting and diarrhea 09/01/2016  . Fall at home   . Right hip pain   . Hepatic encephalopathy (HCC) 02/18/2016  . Visual hallucinations 02/18/2016  . Hypotension 02/18/2016  . Acute renal failure (ARF) (HCC) 02/18/2016  . Chronic pain 02/18/2016  . Abdominal pain 03/26/2015  . Acute on chronic pancreatitis (HCC) 03/26/2015  . Hypokalemia 03/26/2015  . Palpitation 03/26/2015  . Hypomagnesemia 03/26/2015  . Insomnia 03/17/2012  . Diarrhea 02/23/2012  . Hypoalbuminemia 02/21/2012  . Severe protein-calorie malnutrition (HCC) 10/21/2011  . Ascites 09/22/2011  . Alcoholic liver disease (HCC) 09/22/2011  . EtOH dependence (HCC) 07/27/2011  . Essential hypertension 09/16/2009  . SCOLIOSIS 08/17/2009  . UNSPECIFIED MONONEURITIS OF UPPER LIMB 06/12/2008  . HYPERLIPIDEMIA 02/07/2007  . GERD 02/07/2007  . OSTEOARTHRITIS 02/07/2007  . Osteoporosis 02/07/2007    Orientation RESPIRATION BLADDER Height & Weight     Self, Time, Situation, Place  Normal Continent Weight: 47.6 kg (105 lb) Height:  5\' 1"  (154.9 cm)  BEHAVIORAL SYMPTOMS/MOOD NEUROLOGICAL BOWEL NUTRITION STATUS      Continent  (clear liquid to advance to regular,  as tolerated)  AMBULATORY STATUS COMMUNICATION OF NEEDS Skin   Limited Assist Verbally Normal                       Personal Care Assistance Level of Assistance  Bathing, Feeding, Dressing Bathing Assistance: Limited assistance Feeding assistance: Independent Dressing Assistance: Limited assistance     Functional Limitations Info  Sight, Hearing, Speech Sight Info: Adequate Hearing Info: Adequate Speech Info: Adequate    SPECIAL CARE FACTORS FREQUENCY  PT (By licensed PT), OT (By licensed OT)     PT Frequency:  (5x/week) OT Frequency:  (5x/week)            Contractures Contractures Info: Not present    Additional Factors Info  Code Status, Allergies Code Status Info:  (full) Allergies Info: codeine, tramadol           Current Medications (09/02/2016):  This is the current hospital active medication list Current Facility-Administered Medications  Medication Dose Route Frequency Provider Last Rate Last Dose  . 0.9 %  sodium chloride infusion   Intravenous Continuous Ozella RocksMerrell, David J, MD 75 mL/hr at 09/01/16 1711    . acetaminophen (TYLENOL) tablet 650 mg  650 mg Oral Q6H PRN Ozella RocksMerrell, David J, MD       Or  . acetaminophen (TYLENOL) suppository 650 mg  650 mg Rectal Q6H PRN Ozella RocksMerrell, David J, MD      . allopurinol (ZYLOPRIM) tablet 100 mg  100 mg Oral BID Ozella RocksMerrell, David J, MD   100 mg at 09/01/16 2227  . calcium-vitamin D (OSCAL WITH  D) 500-200 MG-UNIT per tablet 1 tablet  1 tablet Oral Q breakfast Ozella Rocks, MD      . colchicine tablet 0.6 mg  0.6 mg Oral BID PRN Ozella Rocks, MD      . enoxaparin (LOVENOX) injection 40 mg  40 mg Subcutaneous Q24H Ozella Rocks, MD   40 mg at 09/01/16 1709  . feeding supplement (BOOST / RESOURCE BREEZE) liquid 1 Container  1 Container Oral TID BM Ozella Rocks, MD   1 Container at 09/01/16 2023  . folic acid (FOLVITE) tablet 0.5 mg  0.5 mg Oral Daily Ozella Rocks, MD   0.5 mg at 09/01/16 1709  . hydrALAZINE  (APRESOLINE) injection 5-10 mg  5-10 mg Intravenous Q4H PRN Ozella Rocks, MD      . ketorolac (TORADOL) 30 MG/ML injection 30 mg  30 mg Intravenous Q6H PRN Ozella Rocks, MD      . lactulose (CHRONULAC) 10 GM/15ML solution 10 g  10 g Oral TID Ozella Rocks, MD   10 g at 09/01/16 2227  . mirtazapine (REMERON) tablet 15 mg  15 mg Oral QHS Ozella Rocks, MD   15 mg at 09/01/16 2227  . morphine 4 MG/ML injection 4 mg  4 mg Intravenous Q4H PRN Ozella Rocks, MD   4 mg at 09/02/16 0750  . ondansetron (ZOFRAN) tablet 4 mg  4 mg Oral Q6H PRN Ozella Rocks, MD       Or  . ondansetron [facility]) injection 4 mg  4 mg Intravenous Q6H PRN Ozella Rocks, MD      . oxyCODONE (OXYCONTIN) 12 hr tablet 10 mg  10 mg Oral Q12H Ozella Rocks, MD      . potassium chloride SA (K-DUR,KLOR-CON) CR tablet 20 mEq  20 mEq Oral Daily Ozella Rocks, MD   20 mEq at 09/01/16 1709  . promethazine (PHENERGAN) injection 12.5-25 mg  12.5-25 mg Intravenous Q6H PRN Ozella Rocks, MD      . propranolol (INDERAL) tablet 10 mg  10 mg Oral BID Ozella Rocks, MD   10 mg at 09/01/16 2231  . spironolactone (ALDACTONE) tablet 50 mg  50 mg Oral Daily Ozella Rocks, MD   50 mg at 09/01/16 1709  . thiamine (VITAMIN B-1) tablet 250 mg  250 mg Oral Daily Ozella Rocks, MD   250 mg at 09/01/16 1709     Discharge Medications: Please see discharge summary for a list of discharge medications.  Relevant Imaging Results:  Relevant Lab Results:   Additional Information SSN: [id number] [place], [place]

## 2016-09-02 NOTE — Care Management Note (Signed)
Case Management Note  Patient Details  Name: Kathryn [name] Burroughs MRN: [id number] Date of Birth: 02-01-1951  Subjective/Objective:                    Action/Plan:   Expected Discharge Date:                  Expected Discharge Plan:  Skilled Nursing Facility  In-House Referral:  Clinical Social Work  Discharge planning Services  CM Consult  Post Acute Care Choice:    Choice offered to:     DME Arranged:    DME Agency:     HH Arranged:    HH Agency:     Status of Service:  Completed, signed off  If discussed at [employer] Length of [employer] Meetings, dates discussed:    Additional Comments:  Epifanio LeschesCole, Mykiah Schmuck [place], RN 09/02/2016, 3:27 PM

## 2016-09-02 NOTE — Progress Notes (Signed)
[facility] PROGRESS NOTE  Kathryn Livingnn B Demary AVW:[id number] DOB: [birth year] DOA: 09/01/2016  PCP: Clovis RileyMitchell, L.August Saucerean, MD  Brief History/Interval Summary: 66 year old female with a past medical history of alcoholic cirrhosis, history of right hip fracture status post replacement in the past, history of coronary artery disease, hyperlipidemia, history of osteoarthritis of the left foot, status post midfoot amputation, peripheral vascular disease was in her usual state of health on 5/16 when she sustained a fall in her kitchen. This was a mechanical fall. She experienced severe pain in the right hip and groin area. She was brought into the emergency department. No hip fracture was noted. Due to significant pain patient was hospitalized.  Reason for Visit: Pain in the right hip  Consultants: Phone discussion with orthopedics  Procedures: None  Antibiotics: None  Subjective/Interval History: Patient states that her pain in the right hip area has improved. Distal pulses. Denies any further episodes of nausea, vomiting or diarrhea.  ROS: Denies any chest pain or shortness of breath  Objective:  Vital Signs  Vitals:   09/01/16 1500 09/01/16 1603 09/01/16 2156 09/02/16 0651  BP: 108/82 91/69 101/66 104/68  Pulse: 94 87 86 82  Resp: 17 15 17 17   Temp:  98.3 F (36.8 C) 98.1 F (36.7 C) 98.6 F (37 C)  TempSrc:  Oral Oral Oral  SpO2: 98% 98% 97% 99%  Weight:  47.6 kg (105 lb)    Height:  5\' 1"  (1.549 m)      Intake/Output Summary (Last 24 hours) at 09/02/16 1254 Last data filed at 09/02/16 0630  Gross per 24 hour  Intake          1171.25 ml  Output               50 ml  Net          1121.25 ml   Filed Weights   09/01/16 1603  Weight: 47.6 kg (105 lb)    General appearance: alert, cooperative, appears stated age and no distress Resp: clear to auscultation bilaterally Cardio: regular rate and rhythm, S1, S2 normal, no murmur, click, rub or gallop GI: soft, non-tender;  bowel sounds normal; no masses,  no organomegaly Extremities: No bruising noted over the right hip area. Tenderness appreciated over the right great toe with limited range of motion of the first PIP. Neurologic: No focal neurological deficits.  Lab Results:  Data Reviewed: I have personally reviewed following labs and imaging studies  CBC:  Recent Labs Lab 09/01/16 0509 09/02/16 0639  WBC 3.7* 3.6*  NEUTROABS 2.6  --   HGB 12.2 11.9*  HCT 37.6 37.6  MCV 94.9 97.9  PLT 106* 94*    Basic Metabolic Panel:  Recent Labs Lab 09/01/16 0509 09/01/16 1635 09/02/16 0639  NA 138  --  135  K 3.1*  --  3.1*  CL 97*  --  99*  CO2 28  --  26  GLUCOSE 133*  --  94  BUN 10  --  8  CREATININE 0.90  --  0.90  CALCIUM 10.4*  --  8.7*  MG  --  0.8*  --   PHOS  --  1.7*  --     GFR: Estimated Creatinine Clearance: 47.5 mL/min (by C-G formula based on SCr of 0.9 mg/dL).  Liver Function Tests:  Recent Labs Lab 09/01/16 0509 09/02/16 0639  AST 33 24  ALT 13* 11*  ALKPHOS 137* 108  BILITOT 1.6* 1.4*  PROT 6.7 6.1*  ALBUMIN  3.2* 3.0*     Recent Labs Lab 09/01/16 0509  LIPASE 73*    Cardiac Enzymes:  Recent Labs Lab 09/01/16 1635  CKTOTAL 56    Radiology Studies: Dg Chest 2 View  Result Date: 09/01/2016 CLINICAL DATA:  Nausea and vomiting. History of hypertension and MI. Hip pain. Fall. EXAM: CHEST  2 VIEW COMPARISON:  02/18/2016.  03/26/2015. FINDINGS: Mediastinum and hilar structures are normal. Stable cardiomegaly. Normal pulmonary vascularity. Mild left base pleuroparenchymal thickening consistent scarring again noted. No acute infiltrate. No pleural effusion or pneumothorax. Old left rib fractures are noted. Mild lower thoracic vertebral body compression fractures, age undetermined. Diffuse osteopenia degenerative change. IMPRESSION: 1. Mild left base pleural-parenchymal thickening consistent scarring again noted . 2. Mild lower thoracic vertebral body compression  fractures, age undetermined. Diffuse osteopenia and degenerative change. Electronically Signed   By: Maisie Fus  Register   On: 09/01/2016 06:14   Dg Forearm Left  Result Date: 09/01/2016 CLINICAL DATA:  Pain following fall EXAM: LEFT FOREARM - 2 VIEW COMPARISON:  None. FINDINGS: Frontal and lateral views were obtained. There is postoperative change in the distal radius with mild bony remodeling from prior radial fracture. No acute fracture or dislocation is evident. There is mild narrowing of the radiocarpal joint. No erosive change. IMPRESSION: Postoperative change in the distal radial region with remodeling in this area. No acute fracture or dislocation. Mild narrowing radiocarpal joint. Electronically Signed   By: Bretta Bang III M.D.   On: 09/01/2016 08:13   Ct Head Wo Contrast  Result Date: 09/01/2016 CLINICAL DATA:  Larey Seat yesterday striking the front of the head. Subsequent headache. EXAM: CT HEAD WITHOUT CONTRAST TECHNIQUE: Contiguous axial images were obtained from the base of the skull through the vertex without intravenous contrast. COMPARISON:  02/18/2016 FINDINGS: Brain: Generalized brain atrophy without evidence of old or acute focal infarction, mass lesion, hemorrhage, hydrocephalus or extra-axial collection. Vascular: No abnormal vascular finding. Skull: No skull fracture or skull lesion. Sinuses/Orbits: Chronic inflammatory changes of the left division of the sphenoid sinus, more extensive than present on the previous study. Other sinuses are clear. Orbits are negative. Other: None significant IMPRESSION: No traumatic finding. No cause of acute headache identified. Age related atrophy. The patient does have inflammatory disease of the left division of the sphenoid sinus, progressive since November of last year. This could be associated with headache. Electronically Signed   By: Paulina Fusi M.D.   On: 09/01/2016 08:01   Ct Pelvis Wo Contrast  Result Date: 09/01/2016 CLINICAL DATA:  Larey Seat  yesterday. Right hip pain. Unable to bear weight. EXAM: CT PELVIS WITHOUT CONTRAST TECHNIQUE: Multidetector CT imaging of the pelvis was performed following the standard protocol without intravenous contrast. COMPARISON:  03/26/2015 FINDINGS: Previous right hip arthroplasty. Components remain well positioned. No evidence of fracture or dislocation. Old rami and symphysis pubis fractures on the left as seen previously, completely healed. No evidence of sacral fracture. Chronic lumbar degenerative disc disease and facet disease. No evidence of soft tissue hematoma or injury. Uterus and adnexal regions appear normal. No free fluid in the pelvis. No acute bowel finding. Bladder appears unremarkable. IMPRESSION: No acute or traumatic finding. No abnormal finding relative to the right hip replacement. No evidence of acute fracture. Old healed fractures of the left pelvis. No definable soft tissue injury. Electronically Signed   By: Paulina Fusi M.D.   On: 09/01/2016 08:05   Dg Toe Great Right  Result Date: 09/01/2016 CLINICAL DATA:  Patient hit toe on solid  object EXAM: RIGHT GREAT TOE COMPARISON:  None. FINDINGS: Frontal, oblique, and lateral views were obtained. There is semi avulsion type fracture along the medial aspect the proximal portion of the first proximal phalanx. Alignment near anatomic. No other fracture. No dislocation. Joint spaces appear normal. Patient has had a previous bunionectomy on the right. IMPRESSION: Avulsion type fracture along the medial proximal aspect of the first proximal phalanx. Alignment near anatomic. No other fracture. No dislocation. Previous bunionectomy. Joint spaces unremarkable. Electronically Signed   By: Bretta Bang III M.D.   On: 09/01/2016 08:15   Dg Hip Unilat With Pelvis 2-3 Views Right  Result Date: 09/01/2016 CLINICAL DATA:  Right hip pain after fall EXAM: DG HIP (WITH OR WITHOUT PELVIS) 2-3V RIGHT COMPARISON:  Right hip radiograph 09/29/2014 FINDINGS: There  is a right total hip arthroplasty without adjacent lucency or periprosthetic fracture. Chronic deformity of the left superior and inferior pubic rami is unchanged. Limited views of the left hip are normal. IMPRESSION: No acute abnormality of the right hip or right total hip arthroplasty hardware. Electronically Signed   By: Deatra Robinson M.D.   On: 09/01/2016 06:11     Medications:  Scheduled: . allopurinol  100 mg Oral BID  . calcium-vitamin D  1 tablet Oral Q breakfast  . enoxaparin (LOVENOX) injection  40 mg Subcutaneous Q24H  . feeding supplement  1 Container Oral TID BM  . folic acid  0.5 mg Oral Daily  . lactulose  10 g Oral TID  . mirtazapine  15 mg Oral QHS  . potassium chloride SA  20 mEq Oral Daily  . propranolol  10 mg Oral BID  . spironolactone  50 mg Oral Daily  . vitamin B-1  250 mg Oral Daily   Continuous: . sodium chloride 75 mL/hr at 09/02/16 1157   ZOX:[id number] **OR** acetaminophen, colchicine, hydrALAZINE, ketorolac, morphine injection, ondansetron **OR** ondansetron (ZOFRAN) IV, oxyCODONE, promethazine  Assessment/Plan:  Active Problems:   Essential hypertension   GERD   Alcoholic liver disease (HCC)   Hypokalemia   Intractable pain   Nausea vomiting and diarrhea    Intractable pain in the right hip As a result of a mechanical fall. Fortunately no fractures noted on imaging studies. Continue pain medications. PT and OT evaluation. It looks like she will need to go to skilled nursing facility for short-term rehabilitation.  Nausea, vomiting and diarrhea Appears to have subsided. Could've had gastroenteritis. Advance diet. Abdomen is benign on examination. Lipase level 73.  Fracture of the first proximal phalanx on the right Discussed with Dr. Cleophas Dunker with orthopedics. Recommends a wooden shoe and splinting of the first with the second toe. Follow-up with orthopedics in 3-4 weeks.  Essential hypertension Reasonably well controlled on propranolol.  Hydralazine as needed.  Hypokalemia and hypomagnesemia Patient was given potassium at the time of admission. Potassium level remains low. Will be given additional doses today. She is also noted to have hypomagnesemia which has been replaced as well. Repeat these levels tomorrow.  Alcohol-induced liver cirrhosis. Appears to be at baseline. Hasn't had any alcohol in 2 years. Medications.  History of gout. Continue allopurinol. Stable.  History of depression.  Continue home medications.  DVT Prophylaxis: Start Lovenox due to low platelet counts. SCDs.    Code Status: Full code  Family Communication: Discussed with the patient  Disposition Plan: Management as outlined above. Anticipate discharge tomorrow.    LOS: 0 days   [facility]  [facility] Pager [phone] 09/02/2016, 12:54 PM  If  7PM-7AM, please contact night-coverage at [email or web address], password [facility]

## 2016-09-02 NOTE — Progress Notes (Signed)
Physical Therapy Treatment Patient Details Name: Kathryn [name] MRN: [id number] DOB: 1951/02/23 Today's Date: 09/02/2016    History of Present Illness Pt is a 66 y/o female admitted secondary to increased pain in RLE after fall. Xray reports showed an avulsion fx of R 1st proximal phalanx, however, all other X rays are negative. No PMH on file.     PT Comments    Pt seen for mobility progression. She continues to have increased pain in R foot and hip. With standing and transfers, pt refusing to bear weight through R LE at this time secondary to pain. Pt able to transfer from bed to chair with RW and min A for stability. Pt would continue to benefit from skilled physical therapy services at this time while admitted and after d/c to address the below listed limitations in order to improve overall safety and independence with functional mobility.    Follow Up Recommendations  SNF     Equipment Recommendations  Rolling walker with 5" wheels    Recommendations for Other Services       Precautions / Restrictions Precautions Precautions: Fall Restrictions Weight Bearing Restrictions: No    Mobility  Bed Mobility Overal bed mobility: Modified Independent             General bed mobility comments: increased time and effort  Transfers Overall transfer level: Needs assistance Equipment used: Rolling walker (2 wheeled) Transfers: Sit to/from [employer]Stand;Stand Pivot Transfers Sit to Stand: Min guard Stand pivot transfers: Min assist       General transfer comment: increased time, good technique, min guard with rise into standing from bed, min A for stability with pivotal movements to chair. Pt refusing to put any weight on R LE at this time, preferring to pivot on L LE only  Ambulation/Gait                 Stairs            Wheelchair Mobility    Modified Rankin (Stroke Patients Only)       Balance Overall balance assessment: Needs assistance Sitting-balance  support: No upper extremity supported;Feet unsupported Sitting balance-Leahy Scale: Good     Standing balance support: Bilateral upper extremity supported Standing balance-Leahy Scale: Poor Standing balance comment: Reliant on RW for mobility.                             Cognition Arousal/Alertness: Awake/alert Behavior During Therapy: WFL for tasks assessed/performed Overall Cognitive Status: Within Functional Limits for tasks assessed                                        Exercises      General Comments        Pertinent Vitals/Pain Pain Assessment: Faces Faces Pain Scale: Hurts even more Pain Location: R foot, hip  Pain Descriptors / Indicators: Sore Pain Intervention(s): Monitored during session;Repositioned    Home Living                      Prior Function            PT Goals (current goals can now be found in the care plan section) Acute Rehab PT Goals PT Goal Formulation: With patient Time For Goal Achievement: 09/15/16 Potential to Achieve Goals: Fair Progress towards PT goals: Progressing toward goals  Frequency    Min 2X/week      PT Plan Current plan remains appropriate    Co-evaluation              AM-PAC PT "6 Clicks" Daily Activity  Outcome Measure  Difficulty turning over in bed (including adjusting bedclothes, sheets and blankets)?: None Difficulty moving from lying on back to sitting on the side of the bed? : None Difficulty sitting down on and standing up from a chair with arms (e.g., wheelchair, bedside commode, etc,.)?: Total Help needed moving to and from a bed to chair (including a wheelchair)?: A Little Help needed walking in hospital room?: A Lot Help needed climbing 3-5 steps with a railing? : Total 6 Click Score: 15    End of Session Equipment Utilized During Treatment: Gait belt Activity Tolerance: Patient limited by pain Patient left: in chair;with call bell/phone within  reach Nurse Communication: Mobility status PT Visit Diagnosis: Other abnormalities of gait and mobility (R26.89);Pain Pain - Right/Left: Right Pain - part of body: Hip;Ankle and joints of foot     Time: 1610-9604 PT Time Calculation (min) (ACUTE ONLY): 18 min  Charges:  $Therapeutic Activity: 8-22 mins                    G Codes:       [place], Anon [place], [place] [phone]    Alessandra Bevels [name] 09/02/2016, 1:09 PM

## 2016-09-02 NOTE — Care Management Obs Status (Signed)
MEDICARE OBSERVATION STATUS NOTIFICATION   Patient Details  Name: Kathryn [name] MRN: [id number] Date of Birth: Jul 23, 1950   Medicare Observation Status Notification Given:  Yes    Epifanio LeschesCole, [place] Sandell Hudson, RN 09/02/2016, 2:23 PM

## 2016-09-03 DIAGNOSIS — M25551 Pain in right hip: Secondary | ICD-10-CM | POA: Diagnosis not present

## 2016-09-03 DIAGNOSIS — K219 Gastro-esophageal reflux disease without esophagitis: Secondary | ICD-10-CM | POA: Diagnosis not present

## 2016-09-03 DIAGNOSIS — E860 Dehydration: Secondary | ICD-10-CM | POA: Diagnosis not present

## 2016-09-03 DIAGNOSIS — I2510 Atherosclerotic heart disease of native coronary artery without angina pectoris: Secondary | ICD-10-CM | POA: Diagnosis not present

## 2016-09-03 DIAGNOSIS — D649 Anemia, unspecified: Secondary | ICD-10-CM

## 2016-09-03 DIAGNOSIS — D638 Anemia in other chronic diseases classified elsewhere: Secondary | ICD-10-CM | POA: Diagnosis not present

## 2016-09-03 DIAGNOSIS — I739 Peripheral vascular disease, unspecified: Secondary | ICD-10-CM | POA: Diagnosis not present

## 2016-09-03 DIAGNOSIS — S0083XA Contusion of other part of head, initial encounter: Secondary | ICD-10-CM | POA: Diagnosis not present

## 2016-09-03 DIAGNOSIS — K709 Alcoholic liver disease, unspecified: Secondary | ICD-10-CM

## 2016-09-03 DIAGNOSIS — I10 Essential (primary) hypertension: Secondary | ICD-10-CM | POA: Diagnosis not present

## 2016-09-03 DIAGNOSIS — F329 Major depressive disorder, single episode, unspecified: Secondary | ICD-10-CM | POA: Diagnosis not present

## 2016-09-03 DIAGNOSIS — S92411A Displaced fracture of proximal phalanx of right great toe, initial encounter for closed fracture: Secondary | ICD-10-CM | POA: Diagnosis not present

## 2016-09-03 DIAGNOSIS — K7030 Alcoholic cirrhosis of liver without ascites: Secondary | ICD-10-CM | POA: Diagnosis not present

## 2016-09-03 DIAGNOSIS — I119 Hypertensive heart disease without heart failure: Secondary | ICD-10-CM | POA: Diagnosis not present

## 2016-09-03 DIAGNOSIS — F1020 Alcohol dependence, uncomplicated: Secondary | ICD-10-CM | POA: Diagnosis not present

## 2016-09-03 DIAGNOSIS — E876 Hypokalemia: Secondary | ICD-10-CM | POA: Diagnosis not present

## 2016-09-03 DIAGNOSIS — I252 Old myocardial infarction: Secondary | ICD-10-CM | POA: Diagnosis not present

## 2016-09-03 DIAGNOSIS — M810 Age-related osteoporosis without current pathological fracture: Secondary | ICD-10-CM | POA: Diagnosis not present

## 2016-09-03 DIAGNOSIS — E785 Hyperlipidemia, unspecified: Secondary | ICD-10-CM | POA: Diagnosis not present

## 2016-09-03 DIAGNOSIS — M109 Gout, unspecified: Secondary | ICD-10-CM | POA: Diagnosis not present

## 2016-09-03 DIAGNOSIS — K529 Noninfective gastroenteritis and colitis, unspecified: Secondary | ICD-10-CM | POA: Diagnosis not present

## 2016-09-03 DIAGNOSIS — W010XXA Fall on same level from slipping, tripping and stumbling without subsequent striking against object, initial encounter: Secondary | ICD-10-CM | POA: Diagnosis not present

## 2016-09-03 LAB — BASIC METABOLIC PANEL
ANION GAP: 7 (ref 5–15)
BUN: 10 mg/dL (ref 6–20)
CHLORIDE: 97 mmol/L — AB (ref 101–111)
CO2: 30 mmol/L (ref 22–32)
Calcium: 8.2 mg/dL — ABNORMAL LOW (ref 8.9–10.3)
Creatinine, Ser: 1.15 mg/dL — ABNORMAL HIGH (ref 0.44–1.00)
GFR calc Af Amer: 57 mL/min — ABNORMAL LOW (ref >60)
GFR, EST NON AFRICAN AMERICAN: 49 mL/min — AB (ref >60)
GLUCOSE: 113 mg/dL — AB (ref 65–99)
POTASSIUM: 3.6 mmol/L (ref 3.5–5.1)
Sodium: 134 mmol/L — ABNORMAL LOW (ref 135–145)

## 2016-09-03 LAB — MAGNESIUM: Magnesium: 1.9 mg/dL (ref 1.7–2.4)

## 2016-09-03 LAB — CBC
HEMATOCRIT: 33.3 % — AB (ref 36.0–46.0)
HEMOGLOBIN: 10.9 g/dL — AB (ref 12.0–15.0)
MCH: 31.5 pg (ref 26.0–34.0)
MCHC: 32.7 g/dL (ref 30.0–36.0)
MCV: 96.2 fL (ref 78.0–100.0)
PLATELETS: 84 10*3/uL — AB (ref 150–400)
RBC: 3.46 MIL/uL — AB (ref 3.87–5.11)
RDW: 13.5 % (ref 11.5–15.5)
WBC: 3.8 10*3/uL — AB (ref 4.0–10.5)

## 2016-09-03 MED ORDER — LACTULOSE 10 GM/15ML PO SOLN: 10.0000 g | Freq: Two times a day (BID) | ORAL | Status: DC

## 2016-09-03 MED ORDER — PROPRANOLOL HCL 10 MG PO TABS
5.0000 mg | ORAL_TABLET | Freq: Two times a day (BID) | ORAL | Status: DC
  Administered 2016-09-04: 5 mg via ORAL
  Filled 2016-09-03 (×2): qty 0.5

## 2016-09-03 MED ORDER — OXYCODONE HCL 5 MG PO TABS: 5.0000 mg | ORAL_TABLET | Freq: Four times a day (QID) | ORAL | 0 refills | Status: DC | PRN

## 2016-09-03 MED ORDER — LACTULOSE 10 GM/15ML PO SOLN: 10.0000 g | Freq: Two times a day (BID) | ORAL | 0 refills | Status: DC

## 2016-09-03 MED ORDER — SPIRONOLACTONE 25 MG PO TABS
25.0000 mg | ORAL_TABLET | Freq: Every day | ORAL | Status: DC
  Administered 2016-09-04: 25 mg via ORAL
  Filled 2016-09-03: qty 1

## 2016-09-03 NOTE — Progress Notes (Signed)
Occupational Therapy Treatment Patient Details Name: Kathryn [name] MRN: [id number] DOB: September 23, 1950 Today's Date: 09/03/2016    History of present illness Pt is a 66 y/o female admitted secondary to increased pain in RLE after fall. Xray reports showed an avulsion fx of R 1st proximal phalanx, however, all other X rays are negative. medical history significant of alcoholic cirrhosis, right hip fracture status post replacement, CAD/MI, hyperlipidemia, pancreatitis, osteomyelitis of the left foot status post midfoot resection, PVD, hypertension, GERD.    OT comments  Pt. Making gains with skilled OT this session.  Able to complete bed mobility and stand pivot transfers to bsc and recliner.  Tolerated increased weight bearing this session!  Follow Up Recommendations  SNF;Supervision/Assistance - 24 hour    Equipment Recommendations  None recommended by OT    Recommendations for Other Services      Precautions / Restrictions Precautions Precautions: Fall       Mobility Bed Mobility Overal bed mobility: Needs Assistance Bed Mobility: Rolling;Sidelying to Sit Rolling: Supervision Sidelying to sit: Supervision       General bed mobility comments: cues for hand placement on bed rails along with encouragement for task completion. pt. self limiting and nervous but able to do more than she thinks she can   Transfers Overall transfer level: Needs assistance Equipment used: 1 person hand held assist Transfers: Sit to/from [employer]Stand;Stand Pivot Transfers Sit to Stand: Min assist Stand pivot transfers: Min assist       General transfer comment: pt. required encouragement but was able to perform 2 stand pivot tranfers this session and was able to weight bear without being told or encouraged to    Balance                                           ADL either performed or assessed with clinical judgement   ADL Overall ADL's : Needs assistance/impaired     Grooming:  Wash/dry hands;Wash/dry face;Bed level       Lower Body Bathing: Moderate assistance;Sit to/from stand Lower Body Bathing Details (indicate cue type and reason): more assistance required initially as pt. had, had a BM in the bed.  once initial clean up performed pt. able to assist with peri area and buttocks in sidelying.  also an additional time seated on bsc with lateral leans         Toilet Transfer: Minimal assistance;Stand-pivot;BSC;Cueing for sequencing Toilet Transfer Details (indicate cue type and reason): cues for hand placement on arm rests to initiate a safe pivot Toileting- Clothing Manipulation and Hygiene: Set up;Sitting/lateral lean       Functional mobility during ADLs: Minimal assistance General ADL Comments: pt. able to assist with LB bathing/peri care bed level and then seated with lateral leans     Vision       Perception     Praxis      Cognition Arousal/Alertness: Awake/alert Behavior During Therapy: WFL for tasks assessed/performed Overall Cognitive Status: Within Functional Limits for tasks assessed                                          Exercises     Shoulder Instructions       General Comments      Pertinent Vitals/ Pain  Pain Assessment: No/denies pain  Home Living                                          Prior Functioning/Environment              Frequency  Min 2X/week        Progress Toward Goals  OT Goals(current goals can now be found in the care plan section)  Progress towards OT goals: Progressing toward goals     Plan Discharge plan remains appropriate    Co-evaluation                 AM-PAC PT "6 Clicks" Daily Activity     Outcome Measure   Help from another person eating meals?: None Help from another person taking care of personal grooming?: None Help from another person toileting, which includes using toliet, bedpan, or urinal?: A Little Help from  another person bathing (including washing, rinsing, drying)?: A Little Help from another person to put on and taking off regular upper body clothing?: A Little Help from another person to put on and taking off regular lower body clothing?: A Little 6 Click Score: 20    End of Session    OT Visit Diagnosis: Unsteadiness on feet (R26.81);Repeated falls (R29.6);Muscle weakness (generalized) (M62.81);Pain Pain - Right/Left: Right Pain - part of body: Hip;Ankle and joints of foot   Activity Tolerance Patient tolerated treatment well   Patient Left in chair;with call bell/phone within reach   Nurse Communication          Time: 9147-8295 OT Time Calculation (min): 20 min  Charges: OT General Charges $OT Visit: 1 Procedure OT Treatments $Self Care/Home Management : 8-22 mins   Robet Leu, COTA/L 09/03/2016, 9:42 AM

## 2016-09-03 NOTE — Progress Notes (Signed)
[facility] PROGRESS NOTE  Kathryn [name] ZOX:[id number] DOB: Sep 07, 1950 DOA: 09/01/2016  PCP: Clovis Riley, L.August Saucer, MD  Brief History/Interval Summary: 66 year old female with a past medical history of alcoholic cirrhosis, history of right hip fracture status post replacement in the past, history of coronary artery disease, hyperlipidemia, history of osteoarthritis of the left foot, status post midfoot amputation, peripheral vascular disease was in her usual state of health on 5/16 when she sustained a fall in her kitchen. This was a mechanical fall. She experienced severe pain in the right hip and groin area. She was brought into the emergency department. No hip fracture was noted. Due to significant pain patient was hospitalized.  Reason for Visit: Pain in the right hip  Consultants: Phone discussion with orthopedics, Dr. Cleophas Dunker  Procedures: None  Antibiotics: None  Subjective/Interval History: Patient is feeling better. Hip pain is improved. Denies any nausea, vomiting or diarrhea. Says that she has low blood pressure chronically.  ROS: Denies any chest pain or shortness of breath  Objective:  Vital Signs  Vitals:   09/02/16 1428 09/02/16 2052 09/03/16 0616 09/03/16 0854  BP: (!) 81/67 93/69 98/66  118/78  Pulse:  76 82 87  Resp:  17 17   Temp:  98.7 F (37.1 C) 98.3 F (36.8 C)   TempSrc:   Oral   SpO2:  97% 98%   Weight:      Height:        Intake/Output Summary (Last 24 hours) at 09/03/16 1020 Last data filed at 09/03/16 0700  Gross per 24 hour  Intake              945 ml  Output              200 ml  Net              745 ml   Filed Weights   09/01/16 1603  Weight: 47.6 kg (105 lb)    General appearance: Awake, alert. No distress Resp: Clear to auscultation bilaterally Cardio: S1, S2 is normal, regular. No S3, S4. No rubs, murmurs, or bruit GI: Abdomen is soft, nontender, nondistended. Bowel sounds are present. No masses or organomegaly Extremities:  No bruising noted over the right hip area.  Neurologic: No focal neurological deficits.  Lab Results:  Data Reviewed: I have personally reviewed following labs and imaging studies  CBC:  Recent Labs Lab 09/01/16 0509 09/02/16 0639 09/03/16 0402  WBC 3.7* 3.6* 3.8*  NEUTROABS 2.6  --   --   HGB 12.2 11.9* 10.9*  HCT 37.6 37.6 33.3*  MCV 94.9 97.9 96.2  PLT 106* 94* 84*    Basic Metabolic Panel:  Recent Labs Lab 09/01/16 0509 09/01/16 1635 09/02/16 0639 09/02/16 1316 09/03/16 0402  NA 138  --  135  --  134*  K 3.1*  --  3.1*  --  3.6  CL 97*  --  99*  --  97*  CO2 28  --  26  --  30  GLUCOSE 133*  --  94  --  113*  BUN 10  --  8  --  10  CREATININE 0.90  --  0.90  --  1.15*  CALCIUM 10.4*  --  8.7*  --  8.2*  MG  --  0.8*  --  2.6* 1.9  PHOS  --  1.7*  --   --   --     GFR: Estimated Creatinine Clearance: 37.1 mL/min (A) (by C-G formula based  on SCr of 1.15 mg/dL (H)).  Liver Function Tests:  Recent Labs Lab 09/01/16 0509 09/02/16 0639  AST 33 24  ALT 13* 11*  ALKPHOS 137* 108  BILITOT 1.6* 1.4*  PROT 6.7 6.1*  ALBUMIN 3.2* 3.0*     Recent Labs Lab 09/01/16 0509  LIPASE 73*    Cardiac Enzymes:  Recent Labs Lab 09/01/16 1635  CKTOTAL 56    Radiology Studies: No results found.   Medications:  Scheduled: . allopurinol  100 mg Oral BID  . calcium-vitamin D  1 tablet Oral Q breakfast  . feeding supplement  1 Container Oral TID BM  . folic acid  0.5 mg Oral Daily  . lactulose  10 g Oral BID  . mirtazapine  15 mg Oral QHS  . potassium chloride SA  20 mEq Oral Daily  . propranolol  10 mg Oral BID  . spironolactone  50 mg Oral Daily  . vitamin B-1  250 mg Oral Daily   Continuous: . sodium chloride 75 mL/hr at 09/03/16 [id number]   RUE:[id number]:acetaminophen **OR** acetaminophen, alum & mag hydroxide-simeth, colchicine, hydrALAZINE, ketorolac, morphine injection, ondansetron **OR** ondansetron (ZOFRAN) IV, oxyCODONE,  promethazine  Assessment/Plan:  Active Problems:   Essential hypertension   GERD   Alcoholic liver disease (HCC)   Hypokalemia   Intractable pain   Nausea vomiting and diarrhea    Intractable pain in the right hip As a result of a mechanical fall. Fortunately no fractures noted on imaging studies. Continue pain medications. PT and OT evaluation. It looks like she will need to go to skilled nursing facility for short-term rehabilitation. Seems to be stable.  Nausea, vomiting and diarrhea Appears to have subsided. Could've had gastroenteritis. Tolerating diet. Stable.   Fracture of the first proximal phalanx on the right Discussed with Dr. Cleophas DunkerWhitfield with orthopedics. Recommends a wooden shoe and splinting of the first with the second toe. Follow-up with orthopedics in 3-4 weeks.  Essential hypertension Blood pressure noted to be borderline low at times. Chronic per patient. She is asymptomatic. Continue propranolol.  Hypokalemia and hypomagnesemia Potassium level has improved. Magnesium is also normal.   Alcohol-induced liver cirrhosis with thrombocytopenia Appears to be at baseline. Hasn't had any alcohol in 2 years. Continue home medication.  History of gout. Continue allopurinol. Stable.  History of depression.  Continue home medications.  Normocytic anemia likely due to chronic disease. Hemoglobin stable.  DVT Prophylaxis: SCDs.    Code Status: Full code  Family Communication: Discussed with the patient  Disposition Plan: Overall stable. Await placement to skilled nursing facility.    LOS: 0 days   [facility] Adult ServicesKRISHNAN,Tahje Borawski  [facility] Pager [phone] 09/03/2016, 10:20 AM  If 7PM-7AM, please contact night-coverage at [email or web address], password [email or web address]

## 2016-09-04 DIAGNOSIS — D649 Anemia, unspecified: Secondary | ICD-10-CM | POA: Diagnosis not present

## 2016-09-04 DIAGNOSIS — K7030 Alcoholic cirrhosis of liver without ascites: Secondary | ICD-10-CM | POA: Diagnosis not present

## 2016-09-04 DIAGNOSIS — I739 Peripheral vascular disease, unspecified: Secondary | ICD-10-CM | POA: Diagnosis not present

## 2016-09-04 DIAGNOSIS — K709 Alcoholic liver disease, unspecified: Secondary | ICD-10-CM | POA: Diagnosis not present

## 2016-09-04 DIAGNOSIS — S99201D Unspecified physeal fracture of phalanx of right toe, subsequent encounter for fracture with routine healing: Secondary | ICD-10-CM | POA: Diagnosis not present

## 2016-09-04 DIAGNOSIS — R197 Diarrhea, unspecified: Secondary | ICD-10-CM | POA: Diagnosis not present

## 2016-09-04 DIAGNOSIS — K219 Gastro-esophageal reflux disease without esophagitis: Secondary | ICD-10-CM | POA: Diagnosis not present

## 2016-09-04 DIAGNOSIS — W19XXXD Unspecified fall, subsequent encounter: Secondary | ICD-10-CM | POA: Diagnosis not present

## 2016-09-04 DIAGNOSIS — M25551 Pain in right hip: Secondary | ICD-10-CM | POA: Diagnosis not present

## 2016-09-04 DIAGNOSIS — I10 Essential (primary) hypertension: Secondary | ICD-10-CM | POA: Diagnosis not present

## 2016-09-04 DIAGNOSIS — M6281 Muscle weakness (generalized): Secondary | ICD-10-CM | POA: Diagnosis not present

## 2016-09-04 DIAGNOSIS — F339 Major depressive disorder, recurrent, unspecified: Secondary | ICD-10-CM | POA: Diagnosis not present

## 2016-09-04 DIAGNOSIS — R4182 Altered mental status, unspecified: Secondary | ICD-10-CM | POA: Diagnosis not present

## 2016-09-04 DIAGNOSIS — M810 Age-related osteoporosis without current pathological fracture: Secondary | ICD-10-CM | POA: Diagnosis not present

## 2016-09-04 DIAGNOSIS — L8944 Pressure ulcer of contiguous site of back, buttock and hip, stage 4: Secondary | ICD-10-CM | POA: Diagnosis not present

## 2016-09-04 DIAGNOSIS — E876 Hypokalemia: Secondary | ICD-10-CM | POA: Diagnosis not present

## 2016-09-04 MED ORDER — PROPRANOLOL HCL 10 MG PO TABS: 5.0000 mg | ORAL_TABLET | Freq: Two times a day (BID) | ORAL | 0 refills | Status: AC

## 2016-09-04 MED ORDER — LACTULOSE 10 GM/15ML PO SOLN: 10.0000 g | Freq: Three times a day (TID) | ORAL | 0 refills | Status: DC

## 2016-09-04 NOTE — Clinical Social Work Note (Signed)
Clinical Social Worker facilitated patient discharge including contacting patient family Maisie Fus(Thomas) and facility [facility](Karen) to confirm patient discharge plans. Clinical information faxed to facility and family agreeable with plan. CSW arranged ambulance transport via [facility] to [facility]. Nurse to call report prior to discharge.  Clinical Social Worker will sign off for now as social work intervention is no longer needed. Please consult us again if new need arises.  Kathryn Battaglini B. Gean QuintBrown,MSW, LCSWA Clinical Social Work Dept Weekend Social Worker [phone] 12:47 PM

## 2016-09-04 NOTE — Progress Notes (Signed)
1000-Present... Preparing patient for d/c to SNF. Ortho tech called to fit patient for wooden shoe. Has delivered two to the bedside. Sp[facility] with [facility]. Awaiting number so that I can call report. Cont with plan of care

## 2016-09-04 NOTE — Clinical Social Work Placement (Signed)
   CLINICAL SOCIAL WORK PLACEMENT  NOTE  Date:  09/04/2016  Patient Details  Name: Kathryn [name] MRN: [id number] Date of Birth: 1951-02-24  Clinical Social Work is seeking post-discharge placement for this patient at the Skilled  Nursing Facility level of care (*CSW will initial, date and re-position this form in  chart as items are completed):  Yes   Patient/family provided with [facility] Clinical Social Work Department's list of facilities offering this level of care within the geographic area requested by the patient (or if unable, by the patient's family).  Yes   Patient/family informed of their freedom to choose among providers that offer the needed level of care, that participate in Medicare, Medicaid or managed care program needed by the patient, have an available bed and are willing to accept the patient.  Yes   Patient/family informed of [facility]'s ownership interest in [facility] and [facility], as well as of the fact that they are under no obligation to receive care at these facilities.  PASRR submitted to EDS on 09/01/16     PASRR number received on 09/01/16     Existing PASRR number confirmed on       FL2 transmitted to all facilities in geographic area requested by pt/family on 09/02/16     FL2 transmitted to all facilities within larger geographic area on       Patient informed that his/her managed care company has contracts with or will negotiate with certain facilities, including the following:        Yes   Patient/family informed of bed offers received.  Patient chooses bed at  [facility])     Physician recommends and patient chooses bed at      Patient to be transferred to  [facility]) on 09/04/16.  Patient to be transferred to facility by  [facility])     Patient family notified on 09/04/16 of transfer.  Name of family member notified:  Maisie Fushomas     PHYSICIAN       Additional Comment:     _______________________________________________ Norlene DuelBROWN, Terren Jandreau B, LCSWA 09/04/2016, 12:52 PM

## 2016-09-04 NOTE — Discharge Summary (Signed)
[facility]  Physician Discharge Summary   Patient ID: Kathryn [name] MRN: [id number] DOB/AGE: 65-Nov-1952 65 y.o.  Admit date: 09/01/2016 Discharge date: 09/04/2016  PCP: Clovis Riley, L.August Saucer, MD  DISCHARGE DIAGNOSES:  Active Problems:   Essential hypertension   GERD   Alcoholic liver disease (HCC)   Hypokalemia   Intractable pain   Nausea vomiting and diarrhea   Right hip pain   RECOMMENDATIONS FOR OUTPATIENT FOLLOW UP: 1. CBC, basic metabolic panel in 1 week   DISCHARGE CONDITION: fair  Diet recommendation: Low sodium  Filed Weights   09/01/16 1603  Weight: 47.6 kg (105 lb)    INITIAL HISTORY: 66 year old female with a past medical history of alcoholic cirrhosis, history of right hip fracture status post replacement in the past, history of coronary artery disease, hyperlipidemia, history of osteoarthritis of the left foot, status post midfoot amputation, peripheral vascular disease was in her usual state of health on 5/16 when she sustained a fall in her kitchen. This was a mechanical fall. She experienced severe pain in the right hip and groin area. She was brought into the emergency department. No hip fracture was noted. Due to significant pain patient was hospitalized.  Consultations: Phone discussion with orthopedics, Dr. Sharmaine Base COURSE:   Intractable pain in the right hip This was a result of a mechanical fall. Fortunately no fractures noted on imaging studies. Patient was admitted for pain control. Pain is reasonably well controlled now. She was seen by physical and occupational therapy. She will need short-term rehabilitation in a skilled nursing facility. Currently stable.   Nausea, vomiting and diarrhea Appears to have subsided. Could've had gastroenteritis or could have been due to lactulose.  Fracture of the first proximal phalanx on the right Discussed with Dr. Cleophas Dunker with orthopedics. He recommends a wooden shoe and splinting of  the first with the second toe. Follow-up with orthopedics in 3-4 weeks.  Essential hypertension Blood pressure noted to be borderline low at times. Chronic per patient. She is asymptomatic. Continue propranolol at a lower dose.  Hypokalemia and hypomagnesemia Potassium level has improved. Magnesium is also normal.   Alcohol-induced liver cirrhosis with thrombocytopenia Appears to be at baseline. Hasn't had any alcohol in 2 years. Continue home medication. Lactulose dose was reduced due to diarrhea. Aim for at least 3 bowel movements per day.  History of gout. Continue allopurinol. Stable.  History of depression.  Continue home medications.  Normocytic anemia likely due to chronic disease. Hemoglobin stable.  Overall, stable. Feels better. Waiting to go to skilled nursing facility. Okay for discharge.   PERTINENT LABS:  The results of significant diagnostics from this hospitalization (including imaging, microbiology, ancillary and laboratory) are listed below for reference.      Labs: Basic Metabolic Panel:  Recent Labs Lab 09/01/16 0509 09/01/16 1635 09/02/16 0639 09/02/16 1316 09/03/16 0402  NA 138  --  135  --  134*  K 3.1*  --  3.1*  --  3.6  CL 97*  --  99*  --  97*  CO2 28  --  26  --  30  GLUCOSE 133*  --  94  --  113*  BUN 10  --  8  --  10  CREATININE 0.90  --  0.90  --  1.15*  CALCIUM 10.4*  --  8.7*  --  8.2*  MG  --  0.8*  --  2.6* 1.9  PHOS  --  1.7*  --   --   --  Liver Function Tests:  Recent Labs Lab 09/01/16 0509 09/02/16 0639  AST 33 24  ALT 13* 11*  ALKPHOS 137* 108  BILITOT 1.6* 1.4*  PROT 6.7 6.1*  ALBUMIN 3.2* 3.0*    Recent Labs Lab 09/01/16 0509  LIPASE 73*   CBC:  Recent Labs Lab 09/01/16 0509 09/02/16 0639 09/03/16 0402  WBC 3.7* 3.6* 3.8*  NEUTROABS 2.6  --   --   HGB 12.2 11.9* 10.9*  HCT 37.6 37.6 33.3*  MCV 94.9 97.9 96.2  PLT 106* 94* 84*   Cardiac Enzymes:  Recent Labs Lab 09/01/16 1635    CKTOTAL 56    IMAGING STUDIES Dg Chest 2 View  Result Date: 09/01/2016 CLINICAL DATA:  Nausea and vomiting. History of hypertension and MI. Hip pain. Fall. EXAM: CHEST  2 VIEW COMPARISON:  02/18/2016.  03/26/2015. FINDINGS: Mediastinum and hilar structures are normal. Stable cardiomegaly. Normal pulmonary vascularity. Mild left base pleuroparenchymal thickening consistent scarring again noted. No acute infiltrate. No pleural effusion or pneumothorax. Old left rib fractures are noted. Mild lower thoracic vertebral body compression fractures, age undetermined. Diffuse osteopenia degenerative change. IMPRESSION: 1. Mild left base pleural-parenchymal thickening consistent scarring again noted . 2. Mild lower thoracic vertebral body compression fractures, age undetermined. Diffuse osteopenia and degenerative change. Electronically Signed   By: Maisie Fus  Register   On: 09/01/2016 06:14   Dg Forearm Left  Result Date: 09/01/2016 CLINICAL DATA:  Pain following fall EXAM: LEFT FOREARM - 2 VIEW COMPARISON:  None. FINDINGS: Frontal and lateral views were obtained. There is postoperative change in the distal radius with mild bony remodeling from prior radial fracture. No acute fracture or dislocation is evident. There is mild narrowing of the radiocarpal joint. No erosive change. IMPRESSION: Postoperative change in the distal radial region with remodeling in this area. No acute fracture or dislocation. Mild narrowing radiocarpal joint. Electronically Signed   By: Bretta Bang III M.D.   On: 09/01/2016 08:13   Ct Head Wo Contrast  Result Date: 09/01/2016 CLINICAL DATA:  Larey Seat yesterday striking the front of the head. Subsequent headache. EXAM: CT HEAD WITHOUT CONTRAST TECHNIQUE: Contiguous axial images were obtained from the base of the skull through the vertex without intravenous contrast. COMPARISON:  02/18/2016 FINDINGS: Brain: Generalized brain atrophy without evidence of old or acute focal infarction, mass  lesion, hemorrhage, hydrocephalus or extra-axial collection. Vascular: No abnormal vascular finding. Skull: No skull fracture or skull lesion. Sinuses/Orbits: Chronic inflammatory changes of the left division of the sphenoid sinus, more extensive than present on the previous study. Other sinuses are clear. Orbits are negative. Other: None significant IMPRESSION: No traumatic finding. No cause of acute headache identified. Age related atrophy. The patient does have inflammatory disease of the left division of the sphenoid sinus, progressive since November of last year. This could be associated with headache. Electronically Signed   By: Paulina Fusi M.D.   On: 09/01/2016 08:01   Ct Pelvis Wo Contrast  Result Date: 09/01/2016 CLINICAL DATA:  Larey Seat yesterday. Right hip pain. Unable to bear weight. EXAM: CT PELVIS WITHOUT CONTRAST TECHNIQUE: Multidetector CT imaging of the pelvis was performed following the standard protocol without intravenous contrast. COMPARISON:  03/26/2015 FINDINGS: Previous right hip arthroplasty. Components remain well positioned. No evidence of fracture or dislocation. Old rami and symphysis pubis fractures on the left as seen previously, completely healed. No evidence of sacral fracture. Chronic lumbar degenerative disc disease and facet disease. No evidence of soft tissue hematoma or injury. Uterus and adnexal regions  appear normal. No free fluid in the pelvis. No acute bowel finding. Bladder appears unremarkable. IMPRESSION: No acute or traumatic finding. No abnormal finding relative to the right hip replacement. No evidence of acute fracture. Old healed fractures of the left pelvis. No definable soft tissue injury. Electronically Signed   By: Paulina Fusi M.D.   On: 09/01/2016 08:05   Dg Toe Great Right  Result Date: 09/01/2016 CLINICAL DATA:  Patient hit toe on solid object EXAM: RIGHT GREAT TOE COMPARISON:  None. FINDINGS: Frontal, oblique, and lateral views were obtained. There is  semi avulsion type fracture along the medial aspect the proximal portion of the first proximal phalanx. Alignment near anatomic. No other fracture. No dislocation. Joint spaces appear normal. Patient has had a previous bunionectomy on the right. IMPRESSION: Avulsion type fracture along the medial proximal aspect of the first proximal phalanx. Alignment near anatomic. No other fracture. No dislocation. Previous bunionectomy. Joint spaces unremarkable. Electronically Signed   By: Bretta Bang III M.D.   On: 09/01/2016 08:15   Dg Hip Unilat With Pelvis 2-3 Views Right  Result Date: 09/01/2016 CLINICAL DATA:  Right hip pain after fall EXAM: DG HIP (WITH OR WITHOUT PELVIS) 2-3V RIGHT COMPARISON:  Right hip radiograph 09/29/2014 FINDINGS: There is a right total hip arthroplasty without adjacent lucency or periprosthetic fracture. Chronic deformity of the left superior and inferior pubic rami is unchanged. Limited views of the left hip are normal. IMPRESSION: No acute abnormality of the right hip or right total hip arthroplasty hardware. Electronically Signed   By: Deatra Robinson M.D.   On: 09/01/2016 06:11    DISCHARGE EXAMINATION: Vitals:   09/03/16 0854 09/03/16 1456 09/03/16 2126 09/04/16 0431  BP: 118/78 (!) 84/60 93/64 103/69  Pulse: 87 83 84 84  Resp:  16 18 18   Temp:  98.3 F (36.8 C) 98.9 F (37.2 C) 98.4 F (36.9 C)  TempSrc:  Oral    SpO2:  98% 100% 100%  Weight:      Height:       General appearance: alert, cooperative, appears stated age and no distress Resp: clear to auscultation bilaterally Cardio: regular rate and rhythm, S1, S2 normal, no murmur, click, rub or gallop GI: soft, non-tender; bowel sounds normal; no masses,  no organomegaly Extremities: extremities normal, atraumatic, no cyanosis or edema  DISPOSITION: SNF  Discharge Instructions    Call MD for:  extreme fatigue    Complete by:  As directed    Call MD for:  persistant dizziness or light-headedness     Complete by:  As directed    Call MD for:  persistant nausea and vomiting    Complete by:  As directed    Call MD for:  severe uncontrolled pain    Complete by:  As directed    Call MD for:  temperature >100.4    Complete by:  As directed    Diet - low sodium heart healthy    Complete by:  As directed    Discharge instructions    Complete by:  As directed    Please see discharge summary for recommendations.  You were cared for by a hospitalist during your hospital stay. If you have any questions about your discharge medications or the care you received while you were in the hospital after you are discharged, you can call the unit and asked to speak with the hospitalist on call if the hospitalist that took care of you is not available. Once you are  discharged, your primary care physician will handle any further medical issues. Please note that NO REFILLS for any discharge medications will be authorized once you are discharged, as it is imperative that you return to your primary care physician (or establish a relationship with a primary care physician if you do not have one) for your aftercare needs so that they can reassess your need for medications and monitor your lab values. If you do not have a primary care physician, you can call [phone] for a physician referral.   Increase activity slowly    Complete by:  As directed       ALLERGIES:  Allergies  Allergen Reactions  . Codeine Hives    Causes blisters 09/02/16:  Tolerating Morphine  . Tramadol Other (See Comments)    Loss of consciousness      Current Discharge Medication List    START taking these medications   Details  oxyCODONE (OXY IR/ROXICODONE) 5 MG immediate release tablet Take 1 tablet (5 mg total) by mouth every 6 (six) hours as needed for moderate pain. Qty: 15 tablet, Refills: 0      CONTINUE these medications which have CHANGED   Details  lactulose (CHRONULAC) 10 GM/15ML solution Take 15 mLs (10 g total) by  mouth 3 (three) times daily. Qty: 240 mL, Refills: 0    propranolol (INDERAL) 10 MG tablet Take 0.5 tablets (5 mg total) by mouth 2 (two) times daily. Qty: 60 tablet, Refills: 0   Associated Diagnoses: Essential hypertension      CONTINUE these medications which have NOT CHANGED   Details  alendronate (FOSAMAX) 70 MG tablet Take 70 mg by mouth once a week. Saturday Refills: 12    allopurinol (ZYLOPRIM) 100 MG tablet Take 100 mg by mouth 2 (two) times daily.    Calcium Carb-Cholecalciferol (CALCIUM 600 + D PO) Take 1 tablet by mouth 2 (two) times daily.    colchicine 0.6 MG tablet Take 0.6 mg by mouth 2 (two) times daily as needed (gout).    folic acid (FOLVITE) 400 MCG tablet Take 400 mcg by mouth daily.    mirtazapine (REMERON) 15 MG tablet Take 1 tablet (15 mg total) by mouth at bedtime. Qty: 30 tablet, Refills: 0    potassium chloride SA (K-DUR,KLOR-CON) 20 MEQ tablet Take 20 mEq by mouth daily.    spironolactone (ALDACTONE) 50 MG tablet Take 50 mg by mouth daily.    Thiamine HCl (VITAMIN B-1) 250 MG tablet Take 250 mg by mouth daily.      STOP taking these medications     furosemide (LASIX) 40 MG tablet           Contact information for follow-up providers    Nadara Mustarduda, Marcus V, MD. Schedule an appointment as soon as possible for a visit in 3 week(s).   Specialty:  Orthopedic Surgery Why:  for phalangeal fracture Contact information: [address] [place] [address] [phone]        Clovis RileyMitchell, L.August Saucerean, MD. Schedule an appointment as soon as possible for a visit in 2 week(s).   Specialty:  Family Medicine Contact information: [address] [place] [address] [phone]            Contact information for after-discharge care    Destination    [facility] Follow up.   Specialty:  Skilled Nursing Chief of Staffacility Contact information: [address] [place] [id number] [phone]  TOTAL DISCHARGE TIME: 35 mins  [facility]  [facility] Pager [phone]  09/04/2016, 11:51 AM

## 2016-09-04 NOTE — Progress Notes (Signed)
[facility] [phone]. [facility] She [facility] and report was given

## 2016-09-06 DIAGNOSIS — K709 Alcoholic liver disease, unspecified: Secondary | ICD-10-CM | POA: Diagnosis not present

## 2016-09-06 DIAGNOSIS — R197 Diarrhea, unspecified: Secondary | ICD-10-CM | POA: Diagnosis not present

## 2016-09-06 DIAGNOSIS — L8944 Pressure ulcer of contiguous site of back, buttock and hip, stage 4: Secondary | ICD-10-CM | POA: Diagnosis not present

## 2016-09-07 DIAGNOSIS — I10 Essential (primary) hypertension: Secondary | ICD-10-CM | POA: Diagnosis not present

## 2016-09-07 DIAGNOSIS — R197 Diarrhea, unspecified: Secondary | ICD-10-CM | POA: Diagnosis not present

## 2016-09-07 DIAGNOSIS — K219 Gastro-esophageal reflux disease without esophagitis: Secondary | ICD-10-CM | POA: Diagnosis not present

## 2016-09-07 DIAGNOSIS — K709 Alcoholic liver disease, unspecified: Secondary | ICD-10-CM | POA: Diagnosis not present

## 2016-09-07 NOTE — Addendum Note (Signed)
Addended by: Marlow BaarsELKINS, Ovila Lepage L on: 09/07/2016 12:13 PM   Modules accepted: Level of Service, SmartSet

## 2016-09-07 NOTE — Progress Notes (Signed)
This encounter was created in error - please disregard.

## 2016-09-09 DIAGNOSIS — D649 Anemia, unspecified: Secondary | ICD-10-CM | POA: Diagnosis not present

## 2016-09-09 DIAGNOSIS — K7030 Alcoholic cirrhosis of liver without ascites: Secondary | ICD-10-CM | POA: Diagnosis not present

## 2016-09-09 DIAGNOSIS — I10 Essential (primary) hypertension: Secondary | ICD-10-CM | POA: Diagnosis not present

## 2016-09-09 DIAGNOSIS — M25551 Pain in right hip: Secondary | ICD-10-CM | POA: Diagnosis not present

## 2016-09-19 DIAGNOSIS — I10 Essential (primary) hypertension: Secondary | ICD-10-CM | POA: Diagnosis not present

## 2016-09-19 DIAGNOSIS — I739 Peripheral vascular disease, unspecified: Secondary | ICD-10-CM | POA: Diagnosis not present

## 2016-09-19 DIAGNOSIS — M6281 Muscle weakness (generalized): Secondary | ICD-10-CM | POA: Diagnosis not present

## 2016-09-19 DIAGNOSIS — F339 Major depressive disorder, recurrent, unspecified: Secondary | ICD-10-CM | POA: Diagnosis not present

## 2016-09-19 DIAGNOSIS — K709 Alcoholic liver disease, unspecified: Secondary | ICD-10-CM | POA: Diagnosis not present

## 2016-09-21 DIAGNOSIS — I10 Essential (primary) hypertension: Secondary | ICD-10-CM | POA: Diagnosis not present

## 2016-09-21 DIAGNOSIS — M25551 Pain in right hip: Secondary | ICD-10-CM | POA: Diagnosis not present

## 2016-09-21 DIAGNOSIS — I739 Peripheral vascular disease, unspecified: Secondary | ICD-10-CM | POA: Diagnosis not present

## 2016-09-21 DIAGNOSIS — I2510 Atherosclerotic heart disease of native coronary artery without angina pectoris: Secondary | ICD-10-CM | POA: Diagnosis not present

## 2016-09-21 DIAGNOSIS — M109 Gout, unspecified: Secondary | ICD-10-CM | POA: Diagnosis not present

## 2016-09-21 DIAGNOSIS — S99201D Unspecified physeal fracture of phalanx of right toe, subsequent encounter for fracture with routine healing: Secondary | ICD-10-CM | POA: Diagnosis not present

## 2016-09-23 DIAGNOSIS — S99201D Unspecified physeal fracture of phalanx of right toe, subsequent encounter for fracture with routine healing: Secondary | ICD-10-CM | POA: Diagnosis not present

## 2016-09-23 DIAGNOSIS — I739 Peripheral vascular disease, unspecified: Secondary | ICD-10-CM | POA: Diagnosis not present

## 2016-09-23 DIAGNOSIS — I2510 Atherosclerotic heart disease of native coronary artery without angina pectoris: Secondary | ICD-10-CM | POA: Diagnosis not present

## 2016-09-23 DIAGNOSIS — I10 Essential (primary) hypertension: Secondary | ICD-10-CM | POA: Diagnosis not present

## 2016-09-23 DIAGNOSIS — M109 Gout, unspecified: Secondary | ICD-10-CM | POA: Diagnosis not present

## 2016-09-23 DIAGNOSIS — M25551 Pain in right hip: Secondary | ICD-10-CM | POA: Diagnosis not present

## 2016-09-26 ENCOUNTER — Ambulatory Visit (INDEPENDENT_AMBULATORY_CARE_PROVIDER_SITE_OTHER): Payer: Medicare HMO | Admitting: Family

## 2016-09-26 ENCOUNTER — Encounter (INDEPENDENT_AMBULATORY_CARE_PROVIDER_SITE_OTHER): Payer: Self-pay | Admitting: Family

## 2016-09-26 ENCOUNTER — Ambulatory Visit (INDEPENDENT_AMBULATORY_CARE_PROVIDER_SITE_OTHER): Payer: Medicare HMO

## 2016-09-26 VITALS — Ht 61.0 in | Wt 105.0 lb

## 2016-09-26 DIAGNOSIS — W19XXXD Unspecified fall, subsequent encounter: Secondary | ICD-10-CM

## 2016-09-26 DIAGNOSIS — M25551 Pain in right hip: Secondary | ICD-10-CM | POA: Diagnosis not present

## 2016-09-26 DIAGNOSIS — S99201D Unspecified physeal fracture of phalanx of right toe, subsequent encounter for fracture with routine healing: Secondary | ICD-10-CM | POA: Diagnosis not present

## 2016-09-26 DIAGNOSIS — M109 Gout, unspecified: Secondary | ICD-10-CM | POA: Diagnosis not present

## 2016-09-26 DIAGNOSIS — Y92009 Unspecified place in unspecified non-institutional (private) residence as the place of occurrence of the external cause: Secondary | ICD-10-CM | POA: Diagnosis not present

## 2016-09-26 DIAGNOSIS — I2510 Atherosclerotic heart disease of native coronary artery without angina pectoris: Secondary | ICD-10-CM | POA: Diagnosis not present

## 2016-09-26 DIAGNOSIS — I10 Essential (primary) hypertension: Secondary | ICD-10-CM | POA: Diagnosis not present

## 2016-09-26 DIAGNOSIS — I739 Peripheral vascular disease, unspecified: Secondary | ICD-10-CM | POA: Diagnosis not present

## 2016-09-26 MED ORDER — LACTULOSE 10 GM/15ML PO SOLN: 3.3370 g | Freq: Three times a day (TID) | ORAL | 0 refills | Status: AC

## 2016-09-26 NOTE — Progress Notes (Signed)
Office Visit Note   Patient: Kathryn [name]           Date of Birth: 1950/11/24           MRN: [id number] Visit Date: 09/26/2016              Requested by: Clovis RileyMitchell, L.August Saucerean, MD [address] [place], [place] [address] PCP: Clovis RileyMitchell, L.August Saucerean, MD  Chief Complaint  Patient presents with  . Right Hip - Pain    Follow up from ER 09/01/16 s/p fall      HPI: The patient is a year old woman seen in follow up for right hip pain following a fall a little over 3 /weeks ago, on 09/01/16. Initial radiographs as well as a CT scan were negative. Continues to have pain with ROM and weight bearing. Has been weight bearing as tolerated with a walker. Did reside in skilled nursing for nearly 3 weeks, was working aggressively with PT there and states continues to at home.  Complaining of continued pain.   Assessment & Plan: Visit Diagnoses:  1. Pain of right hip joint   2. Fall in home, subsequent encounter     Plan: reassurance provided. Will continue with PT. WBAT. Follow up in office with Jonathon BellowsM Duda in 3 weeks.  Follow-Up Instructions: Return in about 3 weeks (around 10/17/2016), or if symptoms worsen or fail to improve, for c duda.   Right Hip Exam   Tenderness  The patient is experiencing tenderness in the lateral (lateral femur, from midshaft to greater trochanter).  Range of Motion  The patient has normal right hip ROM.  Comments:  Pain with internal and external rotation, flexion and extension right hip.    no erythema, swelling, bruising. No sign of infection.  Patient is alert, oriented, no adenopathy, well-dressed, normal affect, normal respiratory effort.  Imaging: Xr Hip Unilat W Or W/o Pelvis 2-3 Views Right  Result Date: 09/26/2016 Unchanged from comparison views on 09/01/16. No acute abnormality of the right hip or right total hiparthroplasty hardware.   Labs: Lab Results  Component Value Date   HGBA1C 4.9 07/26/2011   ESRSEDRATE 24 (H) 06/12/2008   REPTSTATUS 02/23/2016 FINAL 02/18/2016   GRAMSTAIN NO WBC SEEN NO ORGANISMS SEEN 01/03/2012   CULT NO GROWTH 5 DAYS 02/18/2016    Orders:  Orders Placed This Encounter  Procedures  . XR HIP UNILAT W OR W/O PELVIS 2-3 VIEWS RIGHT   No orders of the defined types were placed in this encounter.    Procedures: No procedures performed  Clinical Data: No additional findings.  ROS:  All other systems negative, except as noted in the HPI. Review of Systems  Constitutional: Negative for chills and fever.  Cardiovascular: Negative for leg swelling.  Musculoskeletal: Positive for arthralgias and gait problem. Negative for joint swelling and myalgias.  Skin: Negative for color change and wound.    Objective: Vital Signs: Ht 5\' 1"  (1.549 m)   Wt 105 lb (47.6 kg)   BMI 19.84 kg/m   Specialty Comments:  No specialty comments available.  PMFS History: Patient Active Problem List   Diagnosis Date Noted  . Intractable pain 09/01/2016  . Nausea vomiting and diarrhea 09/01/2016  . Fall at home   . Right hip pain   . Hepatic encephalopathy (HCC) 02/18/2016  . Visual hallucinations 02/18/2016  . Hypotension 02/18/2016  . Acute renal failure (ARF) (HCC) 02/18/2016  . Chronic pain 02/18/2016  . Abdominal pain 03/26/2015  . Acute  on chronic pancreatitis (HCC) 03/26/2015  . Hypokalemia 03/26/2015  . Palpitation 03/26/2015  . Hypomagnesemia 03/26/2015  . Insomnia 03/17/2012  . Diarrhea 02/23/2012  . Hypoalbuminemia 02/21/2012  . Severe protein-calorie malnutrition (HCC) 10/21/2011  . Ascites 09/22/2011  . Alcoholic liver disease (HCC) 09/22/2011  . EtOH dependence (HCC) 07/27/2011  . Essential hypertension 09/16/2009  . SCOLIOSIS 08/17/2009  . UNSPECIFIED MONONEURITIS OF UPPER LIMB 06/12/2008  . HYPERLIPIDEMIA 02/07/2007  . GERD 02/07/2007  . OSTEOARTHRITIS 02/07/2007  . Osteoporosis 02/07/2007   Past Medical History:  Diagnosis Date  . Alcoholic liver disease (HCC)   .  Arthritis    "all over"  . Ascites 09/22/11   "first time for , now resolved  . Dysrhythmia    "irregular"  . Fracture, humerus 07/2011   right  . GERD (gastroesophageal reflux disease)   . High cholesterol   . History of blood transfusion 07/2011   "when I had elbow surgery"  . Hypertension    not current  . Myocardial infarction (HCC)    NSTEMI 12/2011 in setting of septic shock  . Osteomyelitis (HCC)    left foot  . Osteoporosis   . Pancreatitis several years ago  . Peripheral vascular disease (HCC)   . Staph infection    left foot    Family History  Problem Relation Age of Onset  . Cancer Father        stomach, prostate, skin    Past Surgical History:  Procedure Laterality Date  . AMPUTATION     two toes on left foot  . ARTHROSCOPIC REPAIR ACL     right knee  . BUNIONECTOMY     both feet  . ESOPHAGOGASTRODUODENOSCOPY  09/23/2011   Procedure: ESOPHAGOGASTRODUODENOSCOPY (EGD);  Surgeon: Shirley Friar, MD;  Location[facility] ENDOSCOPY;  Service: Endoscopy;  Laterality: N/A;  . ESOPHAGOGASTRODUODENOSCOPY (EGD) WITH PROPOFOL N/A 08/22/2012   Procedure: ESOPHAGOGASTRODUODENOSCOPY (EGD) WITH PROPOFOL;  Surgeon: Willis Modena, MD;  Location: [facility] ENDOSCOPY;  Service: Endoscopy;  Laterality: N/A;  . EUS N/A 03/25/2015   Procedure: UPPER ENDOSCOPIC ULTRASOUND (EUS) RADIAL;  Surgeon: Willis Modena, MD;  Location: [facility] ENDOSCOPY;  Service: Endoscopy;  Laterality: N/A;  . FINE NEEDLE ASPIRATION N/A 03/25/2015   Procedure: FINE NEEDLE ASPIRATION (FNA) RADIAL;  Surgeon: Willis Modena, MD;  Location: [facility] ENDOSCOPY;  Service: Endoscopy;  Laterality: N/A;  . FRACTURE SURGERY  2013   Right elbow  . JOINT REPLACEMENT     right hip   . LEG SURGERY Left    Correction of unequal leg length  . ORIF HUMERUS FRACTURE  07/26/2011   Procedure: OPEN REDUCTION INTERNAL FIXATION (ORIF) DISTAL HUMERUS FRACTURE;  Surgeon: Budd Palmer, MD;  Location: [facility] OR;  Service: Orthopedics;  Laterality: Right;  .  ORIF WRIST FRACTURE Left 12/26/2014   Procedure: OPEN REDUCTION INTERNAL FIXATION (ORIF) WRIST FRACTURE;  Surgeon: Cammy Copa, MD;  Location: [facility] OR;  Service: Orthopedics;  Laterality: Left;  . PARACENTESIS  09/22/11   2.2L  . TOTAL HIP ARTHROPLASTY  ~ 2003   right  . TRANSMETATARSAL AMPUTATION  02/16/2012   Procedure: TRANSMETATARSAL AMPUTATION;  Surgeon: Nadara Mustard, MD;  Location: [facility] OR;  Service: Orthopedics;  Laterality: Left;  . TUBAL LIGATION  1980   Social History   Occupational History  . Not on file.   Social History Main Topics  . Smoking status: Never Smoker  . Smokeless tobacco: Never Used  . Alcohol use No     Comment: "last drink was  day before OR in 07/2011"  . Drug use: No  . Sexual activity: Not Currently

## 2016-09-27 DIAGNOSIS — M25551 Pain in right hip: Secondary | ICD-10-CM | POA: Diagnosis not present

## 2016-09-27 DIAGNOSIS — I10 Essential (primary) hypertension: Secondary | ICD-10-CM | POA: Diagnosis not present

## 2016-09-27 DIAGNOSIS — S99201D Unspecified physeal fracture of phalanx of right toe, subsequent encounter for fracture with routine healing: Secondary | ICD-10-CM | POA: Diagnosis not present

## 2016-09-27 DIAGNOSIS — M109 Gout, unspecified: Secondary | ICD-10-CM | POA: Diagnosis not present

## 2016-09-27 DIAGNOSIS — I739 Peripheral vascular disease, unspecified: Secondary | ICD-10-CM | POA: Diagnosis not present

## 2016-09-27 DIAGNOSIS — I2510 Atherosclerotic heart disease of native coronary artery without angina pectoris: Secondary | ICD-10-CM | POA: Diagnosis not present

## 2016-09-28 DIAGNOSIS — I2510 Atherosclerotic heart disease of native coronary artery without angina pectoris: Secondary | ICD-10-CM | POA: Diagnosis not present

## 2016-09-28 DIAGNOSIS — I10 Essential (primary) hypertension: Secondary | ICD-10-CM | POA: Diagnosis not present

## 2016-09-28 DIAGNOSIS — S99201D Unspecified physeal fracture of phalanx of right toe, subsequent encounter for fracture with routine healing: Secondary | ICD-10-CM | POA: Diagnosis not present

## 2016-09-28 DIAGNOSIS — M25551 Pain in right hip: Secondary | ICD-10-CM | POA: Diagnosis not present

## 2016-09-28 DIAGNOSIS — M109 Gout, unspecified: Secondary | ICD-10-CM | POA: Diagnosis not present

## 2016-09-28 DIAGNOSIS — I739 Peripheral vascular disease, unspecified: Secondary | ICD-10-CM | POA: Diagnosis not present

## 2016-09-30 DIAGNOSIS — I739 Peripheral vascular disease, unspecified: Secondary | ICD-10-CM | POA: Diagnosis not present

## 2016-09-30 DIAGNOSIS — M109 Gout, unspecified: Secondary | ICD-10-CM | POA: Diagnosis not present

## 2016-09-30 DIAGNOSIS — I10 Essential (primary) hypertension: Secondary | ICD-10-CM | POA: Diagnosis not present

## 2016-09-30 DIAGNOSIS — M25551 Pain in right hip: Secondary | ICD-10-CM | POA: Diagnosis not present

## 2016-09-30 DIAGNOSIS — I2510 Atherosclerotic heart disease of native coronary artery without angina pectoris: Secondary | ICD-10-CM | POA: Diagnosis not present

## 2016-09-30 DIAGNOSIS — S99201D Unspecified physeal fracture of phalanx of right toe, subsequent encounter for fracture with routine healing: Secondary | ICD-10-CM | POA: Diagnosis not present

## 2016-10-03 DIAGNOSIS — I739 Peripheral vascular disease, unspecified: Secondary | ICD-10-CM | POA: Diagnosis not present

## 2016-10-03 DIAGNOSIS — I2510 Atherosclerotic heart disease of native coronary artery without angina pectoris: Secondary | ICD-10-CM | POA: Diagnosis not present

## 2016-10-03 DIAGNOSIS — M109 Gout, unspecified: Secondary | ICD-10-CM | POA: Diagnosis not present

## 2016-10-03 DIAGNOSIS — S99201D Unspecified physeal fracture of phalanx of right toe, subsequent encounter for fracture with routine healing: Secondary | ICD-10-CM | POA: Diagnosis not present

## 2016-10-03 DIAGNOSIS — I10 Essential (primary) hypertension: Secondary | ICD-10-CM | POA: Diagnosis not present

## 2016-10-03 DIAGNOSIS — M25551 Pain in right hip: Secondary | ICD-10-CM | POA: Diagnosis not present

## 2016-10-04 DIAGNOSIS — E876 Hypokalemia: Secondary | ICD-10-CM | POA: Diagnosis not present

## 2016-10-04 DIAGNOSIS — I739 Peripheral vascular disease, unspecified: Secondary | ICD-10-CM | POA: Diagnosis not present

## 2016-10-04 DIAGNOSIS — M109 Gout, unspecified: Secondary | ICD-10-CM | POA: Diagnosis not present

## 2016-10-04 DIAGNOSIS — I2510 Atherosclerotic heart disease of native coronary artery without angina pectoris: Secondary | ICD-10-CM | POA: Diagnosis not present

## 2016-10-04 DIAGNOSIS — I10 Essential (primary) hypertension: Secondary | ICD-10-CM | POA: Diagnosis not present

## 2016-10-04 DIAGNOSIS — M25551 Pain in right hip: Secondary | ICD-10-CM | POA: Diagnosis not present

## 2016-10-04 DIAGNOSIS — S99201D Unspecified physeal fracture of phalanx of right toe, subsequent encounter for fracture with routine healing: Secondary | ICD-10-CM | POA: Diagnosis not present

## 2016-10-05 DIAGNOSIS — I2510 Atherosclerotic heart disease of native coronary artery without angina pectoris: Secondary | ICD-10-CM | POA: Diagnosis not present

## 2016-10-05 DIAGNOSIS — S99201D Unspecified physeal fracture of phalanx of right toe, subsequent encounter for fracture with routine healing: Secondary | ICD-10-CM | POA: Diagnosis not present

## 2016-10-05 DIAGNOSIS — I739 Peripheral vascular disease, unspecified: Secondary | ICD-10-CM | POA: Diagnosis not present

## 2016-10-05 DIAGNOSIS — M25551 Pain in right hip: Secondary | ICD-10-CM | POA: Diagnosis not present

## 2016-10-05 DIAGNOSIS — I10 Essential (primary) hypertension: Secondary | ICD-10-CM | POA: Diagnosis not present

## 2016-10-05 DIAGNOSIS — M109 Gout, unspecified: Secondary | ICD-10-CM | POA: Diagnosis not present

## 2016-10-07 DIAGNOSIS — I2510 Atherosclerotic heart disease of native coronary artery without angina pectoris: Secondary | ICD-10-CM | POA: Diagnosis not present

## 2016-10-07 DIAGNOSIS — I739 Peripheral vascular disease, unspecified: Secondary | ICD-10-CM | POA: Diagnosis not present

## 2016-10-07 DIAGNOSIS — M25551 Pain in right hip: Secondary | ICD-10-CM | POA: Diagnosis not present

## 2016-10-07 DIAGNOSIS — M109 Gout, unspecified: Secondary | ICD-10-CM | POA: Diagnosis not present

## 2016-10-07 DIAGNOSIS — I10 Essential (primary) hypertension: Secondary | ICD-10-CM | POA: Diagnosis not present

## 2016-10-07 DIAGNOSIS — S99201D Unspecified physeal fracture of phalanx of right toe, subsequent encounter for fracture with routine healing: Secondary | ICD-10-CM | POA: Diagnosis not present

## 2016-10-10 DIAGNOSIS — I739 Peripheral vascular disease, unspecified: Secondary | ICD-10-CM | POA: Diagnosis not present

## 2016-10-10 DIAGNOSIS — S99201D Unspecified physeal fracture of phalanx of right toe, subsequent encounter for fracture with routine healing: Secondary | ICD-10-CM | POA: Diagnosis not present

## 2016-10-10 DIAGNOSIS — M25551 Pain in right hip: Secondary | ICD-10-CM | POA: Diagnosis not present

## 2016-10-10 DIAGNOSIS — I10 Essential (primary) hypertension: Secondary | ICD-10-CM | POA: Diagnosis not present

## 2016-10-10 DIAGNOSIS — I2510 Atherosclerotic heart disease of native coronary artery without angina pectoris: Secondary | ICD-10-CM | POA: Diagnosis not present

## 2016-10-10 DIAGNOSIS — M109 Gout, unspecified: Secondary | ICD-10-CM | POA: Diagnosis not present

## 2016-10-12 ENCOUNTER — Ambulatory Visit (INDEPENDENT_AMBULATORY_CARE_PROVIDER_SITE_OTHER): Payer: Medicare HMO

## 2016-10-12 ENCOUNTER — Encounter (INDEPENDENT_AMBULATORY_CARE_PROVIDER_SITE_OTHER): Payer: Self-pay | Admitting: Family

## 2016-10-12 ENCOUNTER — Ambulatory Visit (INDEPENDENT_AMBULATORY_CARE_PROVIDER_SITE_OTHER): Payer: Medicare HMO | Admitting: Family

## 2016-10-12 VITALS — Ht 61.0 in | Wt 105.0 lb

## 2016-10-12 DIAGNOSIS — M79671 Pain in right foot: Secondary | ICD-10-CM

## 2016-10-12 DIAGNOSIS — I10 Essential (primary) hypertension: Secondary | ICD-10-CM | POA: Diagnosis not present

## 2016-10-12 DIAGNOSIS — M25551 Pain in right hip: Secondary | ICD-10-CM | POA: Diagnosis not present

## 2016-10-12 DIAGNOSIS — I2510 Atherosclerotic heart disease of native coronary artery without angina pectoris: Secondary | ICD-10-CM | POA: Diagnosis not present

## 2016-10-12 DIAGNOSIS — M109 Gout, unspecified: Secondary | ICD-10-CM | POA: Diagnosis not present

## 2016-10-12 DIAGNOSIS — S99201D Unspecified physeal fracture of phalanx of right toe, subsequent encounter for fracture with routine healing: Secondary | ICD-10-CM | POA: Diagnosis not present

## 2016-10-12 DIAGNOSIS — I739 Peripheral vascular disease, unspecified: Secondary | ICD-10-CM | POA: Diagnosis not present

## 2016-10-12 NOTE — Progress Notes (Signed)
Office Visit Note   Patient: Kathryn [name]           Date of Birth: 1951-02-21           MRN: [id number] Visit Date: 10/12/2016              Requested by: Clovis Riley, L.August Saucer, MD [address] [place], [place] [address] PCP: Clovis Riley, L.August Saucer, MD  Chief Complaint  Patient presents with  . Right Hip - Follow-up    S/p fall DOI 09/01/16      HPI: The patient is a year old woman seen in follow up for right hip pain as well as right great toe fracture.  Has been working with PT 3 times weekly. States hip pain has resolved. Feels is making strides with PT. Does feel needs further PT. No assistive devices.  Complaining of medial pain to right great toe. Has a proximal phalanx fracture, R GT. Obtained on 09/01/16. In water shoes today, flexible soles. States the pain has worsened over last 4 days. No recent injury. Has been active with PT.    Assessment & Plan: Visit Diagnoses:  1. Pain in right foot     Plan: Resume Post op shoe on right. Rest. May use ice and elevation. reassurance provided. Will continue with PT. WBAT. Follow up in office in 2 weeks.  Follow-Up Instructions: Return in about 2 weeks (around 10/26/2016).   Right Hip Exam   Tenderness  The patient is experiencing no tenderness.     Range of Motion  The patient has normal right hip ROM.    no erythema, swelling, bruising. No sign of infection.  Right great toe tender to MTP joint and medial proximal phalanx. No ecchymosis or erythema. Minimal swelling.   Patient is alert, oriented, no adenopathy, well-dressed, normal affect, normal respiratory effort.  Imaging: Xr Foot 2 Views Right  Result Date: 10/12/2016 Great toe proximal phalanx fracture medially is seen. Interval callus formation. No complicating features.    Labs: Lab Results  Component Value Date   HGBA1C 4.9 07/26/2011   ESRSEDRATE 24 (H) 06/12/2008   REPTSTATUS 02/23/2016 FINAL 02/18/2016   GRAMSTAIN NO WBC SEEN NO ORGANISMS  SEEN 01/03/2012   CULT NO GROWTH 5 DAYS 02/18/2016    Orders:  Orders Placed This Encounter  Procedures  . XR Foot 2 Views Right   No orders of the defined types were placed in this encounter.    Procedures: No procedures performed  Clinical Data: No additional findings.  ROS:  All other systems negative, except as noted in the HPI. Review of Systems  Constitutional: Negative for chills and fever.  Cardiovascular: Negative for leg swelling.  Musculoskeletal: Positive for arthralgias and gait problem. Negative for joint swelling and myalgias.  Skin: Negative for color change and wound.    Objective: Vital Signs: Ht 5\' 1"  (1.549 m)   Wt 105 lb (47.6 kg)   BMI 19.84 kg/m   Specialty Comments:  No specialty comments available.  PMFS History: Patient Active Problem List   Diagnosis Date Noted  . Intractable pain 09/01/2016  . Nausea vomiting and diarrhea 09/01/2016  . Fall at home   . Right hip pain   . Hepatic encephalopathy (HCC) 02/18/2016  . Visual hallucinations 02/18/2016  . Hypotension 02/18/2016  . Acute renal failure (ARF) (HCC) 02/18/2016  . Chronic pain 02/18/2016  . Abdominal pain 03/26/2015  . Acute on chronic pancreatitis (HCC) 03/26/2015  . Hypokalemia 03/26/2015  . Palpitation 03/26/2015  .  Hypomagnesemia 03/26/2015  . Insomnia 03/17/2012  . Diarrhea 02/23/2012  . Hypoalbuminemia 02/21/2012  . Severe protein-calorie malnutrition (HCC) 10/21/2011  . Ascites 09/22/2011  . Alcoholic liver disease (HCC) 09/22/2011  . EtOH dependence (HCC) 07/27/2011  . Essential hypertension 09/16/2009  . SCOLIOSIS 08/17/2009  . UNSPECIFIED MONONEURITIS OF UPPER LIMB 06/12/2008  . HYPERLIPIDEMIA 02/07/2007  . GERD 02/07/2007  . OSTEOARTHRITIS 02/07/2007  . Osteoporosis 02/07/2007   Past Medical History:  Diagnosis Date  . Alcoholic liver disease (HCC)   . Arthritis    "all over"  . Ascites 09/22/11   "first time for , now resolved  . Dysrhythmia     "irregular"  . Fracture, humerus 07/2011   right  . GERD (gastroesophageal reflux disease)   . High cholesterol   . History of blood transfusion 07/2011   "when I had elbow surgery"  . Hypertension    not current  . Myocardial infarction (HCC)    NSTEMI 12/2011 in setting of septic shock  . Osteomyelitis (HCC)    left foot  . Osteoporosis   . Pancreatitis several years ago  . Peripheral vascular disease (HCC)   . Staph infection    left foot    Family History  Problem Relation Age of Onset  . Cancer Father        stomach, prostate, skin    Past Surgical History:  Procedure Laterality Date  . AMPUTATION     two toes on left foot  . ARTHROSCOPIC REPAIR ACL     right knee  . BUNIONECTOMY     both feet  . ESOPHAGOGASTRODUODENOSCOPY  09/23/2011   Procedure: ESOPHAGOGASTRODUODENOSCOPY (EGD);  Surgeon: Shirley FriarVincent C. Schooler, MD;  [facility];  Service: Endoscopy;  Laterality: N/A;  . ESOPHAGOGASTRODUODENOSCOPY (EGD) WITH PROPOFOL N/A 08/22/2012   Procedure: ESOPHAGOGASTRODUODENOSCOPY (EGD) WITH PROPOFOL;  Surgeon: Willis ModenaWilliam Outlaw, MD;  Location: [facility] ENDOSCOPY;  Service: Endoscopy;  Laterality: N/A;  . EUS N/A 03/25/2015   Procedure: UPPER ENDOSCOPIC ULTRASOUND (EUS) RADIAL;  Surgeon: Willis ModenaWilliam Outlaw, MD;  Location: [facility] ENDOSCOPY;  Service: Endoscopy;  Laterality: N/A;  . FINE NEEDLE ASPIRATION N/A 03/25/2015   Procedure: FINE NEEDLE ASPIRATION (FNA) RADIAL;  Surgeon: Willis ModenaWilliam Outlaw, MD;  Location: [facility] ENDOSCOPY;  Service: Endoscopy;  Laterality: N/A;  . FRACTURE SURGERY  2013   Right elbow  . JOINT REPLACEMENT     right hip   . LEG SURGERY Left    Correction of unequal leg length  . ORIF HUMERUS FRACTURE  07/26/2011   Procedure: OPEN REDUCTION INTERNAL FIXATION (ORIF) DISTAL HUMERUS FRACTURE;  Surgeon: Budd PalmerMichael H Handy, MD;  Location: [facility] OR;  Service: Orthopedics;  Laterality: Right;  . ORIF WRIST FRACTURE Left 12/26/2014   Procedure: OPEN REDUCTION INTERNAL FIXATION (ORIF) WRIST  FRACTURE;  Surgeon: Cammy CopaScott Gregory Dean, MD;  Location: [facility] OR;  Service: Orthopedics;  Laterality: Left;  . PARACENTESIS  09/22/11   2.2L  . TOTAL HIP ARTHROPLASTY  ~ 2003   right  . TRANSMETATARSAL AMPUTATION  02/16/2012   Procedure: TRANSMETATARSAL AMPUTATION;  Surgeon: Nadara MustardMarcus V Duda, MD;  [facility];  Service: Orthopedics;  Laterality: Left;  . TUBAL LIGATION  1980   Social History   Occupational History  . Not on file.   Social History Main Topics  . Smoking status: Never Smoker  . Smokeless tobacco: Never Used  . Alcohol use No     Comment: "last drink was day before OR in 07/2011"  . Drug use: No  . Sexual activity: Not  Currently

## 2016-10-14 ENCOUNTER — Telehealth (INDEPENDENT_AMBULATORY_CARE_PROVIDER_SITE_OTHER): Payer: Self-pay | Admitting: Family

## 2016-10-14 DIAGNOSIS — S99201D Unspecified physeal fracture of phalanx of right toe, subsequent encounter for fracture with routine healing: Secondary | ICD-10-CM | POA: Diagnosis not present

## 2016-10-14 DIAGNOSIS — M109 Gout, unspecified: Secondary | ICD-10-CM | POA: Diagnosis not present

## 2016-10-14 DIAGNOSIS — I2510 Atherosclerotic heart disease of native coronary artery without angina pectoris: Secondary | ICD-10-CM | POA: Diagnosis not present

## 2016-10-14 DIAGNOSIS — M25551 Pain in right hip: Secondary | ICD-10-CM | POA: Diagnosis not present

## 2016-10-14 DIAGNOSIS — I739 Peripheral vascular disease, unspecified: Secondary | ICD-10-CM | POA: Diagnosis not present

## 2016-10-14 DIAGNOSIS — I10 Essential (primary) hypertension: Secondary | ICD-10-CM | POA: Diagnosis not present

## 2016-10-14 NOTE — Telephone Encounter (Signed)
Patient called advised the wooden shoe she has does not fit. Patient advised her (PT) will not allow her to wear the one she's got. Patient asked if she can get another one. The number to contact patient is [phone]

## 2016-10-17 DIAGNOSIS — I10 Essential (primary) hypertension: Secondary | ICD-10-CM | POA: Diagnosis not present

## 2016-10-17 DIAGNOSIS — S99201D Unspecified physeal fracture of phalanx of right toe, subsequent encounter for fracture with routine healing: Secondary | ICD-10-CM | POA: Diagnosis not present

## 2016-10-17 DIAGNOSIS — M109 Gout, unspecified: Secondary | ICD-10-CM | POA: Diagnosis not present

## 2016-10-17 DIAGNOSIS — M25551 Pain in right hip: Secondary | ICD-10-CM | POA: Diagnosis not present

## 2016-10-17 DIAGNOSIS — I2510 Atherosclerotic heart disease of native coronary artery without angina pectoris: Secondary | ICD-10-CM | POA: Diagnosis not present

## 2016-10-17 DIAGNOSIS — I739 Peripheral vascular disease, unspecified: Secondary | ICD-10-CM | POA: Diagnosis not present

## 2016-10-17 NOTE — Telephone Encounter (Signed)
Called pt and she will come in tomorrow at 9:30 for a nurse only visit to be fitted for a new post op shoe.

## 2016-10-18 ENCOUNTER — Ambulatory Visit (INDEPENDENT_AMBULATORY_CARE_PROVIDER_SITE_OTHER): Payer: Medicare HMO

## 2016-10-20 DIAGNOSIS — I739 Peripheral vascular disease, unspecified: Secondary | ICD-10-CM | POA: Diagnosis not present

## 2016-10-20 DIAGNOSIS — M109 Gout, unspecified: Secondary | ICD-10-CM | POA: Diagnosis not present

## 2016-10-20 DIAGNOSIS — S99201D Unspecified physeal fracture of phalanx of right toe, subsequent encounter for fracture with routine healing: Secondary | ICD-10-CM | POA: Diagnosis not present

## 2016-10-20 DIAGNOSIS — M25551 Pain in right hip: Secondary | ICD-10-CM | POA: Diagnosis not present

## 2016-10-20 DIAGNOSIS — I2510 Atherosclerotic heart disease of native coronary artery without angina pectoris: Secondary | ICD-10-CM | POA: Diagnosis not present

## 2016-10-20 DIAGNOSIS — I10 Essential (primary) hypertension: Secondary | ICD-10-CM | POA: Diagnosis not present

## 2016-10-21 DIAGNOSIS — S99201D Unspecified physeal fracture of phalanx of right toe, subsequent encounter for fracture with routine healing: Secondary | ICD-10-CM | POA: Diagnosis not present

## 2016-10-21 DIAGNOSIS — M109 Gout, unspecified: Secondary | ICD-10-CM | POA: Diagnosis not present

## 2016-10-21 DIAGNOSIS — I739 Peripheral vascular disease, unspecified: Secondary | ICD-10-CM | POA: Diagnosis not present

## 2016-10-21 DIAGNOSIS — M25551 Pain in right hip: Secondary | ICD-10-CM | POA: Diagnosis not present

## 2016-10-21 DIAGNOSIS — I2510 Atherosclerotic heart disease of native coronary artery without angina pectoris: Secondary | ICD-10-CM | POA: Diagnosis not present

## 2016-10-21 DIAGNOSIS — I10 Essential (primary) hypertension: Secondary | ICD-10-CM | POA: Diagnosis not present

## 2016-10-24 ENCOUNTER — Telehealth (INDEPENDENT_AMBULATORY_CARE_PROVIDER_SITE_OTHER): Payer: Self-pay | Admitting: Family

## 2016-10-24 DIAGNOSIS — M109 Gout, unspecified: Secondary | ICD-10-CM | POA: Diagnosis not present

## 2016-10-24 DIAGNOSIS — I2510 Atherosclerotic heart disease of native coronary artery without angina pectoris: Secondary | ICD-10-CM | POA: Diagnosis not present

## 2016-10-24 DIAGNOSIS — M25551 Pain in right hip: Secondary | ICD-10-CM | POA: Diagnosis not present

## 2016-10-24 DIAGNOSIS — I10 Essential (primary) hypertension: Secondary | ICD-10-CM | POA: Diagnosis not present

## 2016-10-24 DIAGNOSIS — S99201D Unspecified physeal fracture of phalanx of right toe, subsequent encounter for fracture with routine healing: Secondary | ICD-10-CM | POA: Diagnosis not present

## 2016-10-24 DIAGNOSIS — I739 Peripheral vascular disease, unspecified: Secondary | ICD-10-CM | POA: Diagnosis not present

## 2016-10-24 NOTE — Telephone Encounter (Signed)
Returned call to patient left message for a return call °

## 2016-10-25 DIAGNOSIS — S99201D Unspecified physeal fracture of phalanx of right toe, subsequent encounter for fracture with routine healing: Secondary | ICD-10-CM | POA: Diagnosis not present

## 2016-10-25 DIAGNOSIS — M109 Gout, unspecified: Secondary | ICD-10-CM | POA: Diagnosis not present

## 2016-10-25 DIAGNOSIS — I2510 Atherosclerotic heart disease of native coronary artery without angina pectoris: Secondary | ICD-10-CM | POA: Diagnosis not present

## 2016-10-25 DIAGNOSIS — I739 Peripheral vascular disease, unspecified: Secondary | ICD-10-CM | POA: Diagnosis not present

## 2016-10-25 DIAGNOSIS — M25551 Pain in right hip: Secondary | ICD-10-CM | POA: Diagnosis not present

## 2016-10-25 DIAGNOSIS — I10 Essential (primary) hypertension: Secondary | ICD-10-CM | POA: Diagnosis not present

## 2016-10-26 DIAGNOSIS — I2510 Atherosclerotic heart disease of native coronary artery without angina pectoris: Secondary | ICD-10-CM | POA: Diagnosis not present

## 2016-10-26 DIAGNOSIS — M25551 Pain in right hip: Secondary | ICD-10-CM | POA: Diagnosis not present

## 2016-10-26 DIAGNOSIS — I10 Essential (primary) hypertension: Secondary | ICD-10-CM | POA: Diagnosis not present

## 2016-10-26 DIAGNOSIS — S99201D Unspecified physeal fracture of phalanx of right toe, subsequent encounter for fracture with routine healing: Secondary | ICD-10-CM | POA: Diagnosis not present

## 2016-10-26 DIAGNOSIS — M109 Gout, unspecified: Secondary | ICD-10-CM | POA: Diagnosis not present

## 2016-10-26 DIAGNOSIS — I739 Peripheral vascular disease, unspecified: Secondary | ICD-10-CM | POA: Diagnosis not present

## 2016-10-27 DIAGNOSIS — M25551 Pain in right hip: Secondary | ICD-10-CM | POA: Diagnosis not present

## 2016-10-27 DIAGNOSIS — S99201D Unspecified physeal fracture of phalanx of right toe, subsequent encounter for fracture with routine healing: Secondary | ICD-10-CM | POA: Diagnosis not present

## 2016-10-27 DIAGNOSIS — I10 Essential (primary) hypertension: Secondary | ICD-10-CM | POA: Diagnosis not present

## 2016-10-27 DIAGNOSIS — I739 Peripheral vascular disease, unspecified: Secondary | ICD-10-CM | POA: Diagnosis not present

## 2016-10-27 DIAGNOSIS — M109 Gout, unspecified: Secondary | ICD-10-CM | POA: Diagnosis not present

## 2016-10-27 DIAGNOSIS — I2510 Atherosclerotic heart disease of native coronary artery without angina pectoris: Secondary | ICD-10-CM | POA: Diagnosis not present

## 2016-10-28 ENCOUNTER — Ambulatory Visit (INDEPENDENT_AMBULATORY_CARE_PROVIDER_SITE_OTHER): Payer: Medicare HMO | Admitting: Family

## 2016-10-28 ENCOUNTER — Encounter (INDEPENDENT_AMBULATORY_CARE_PROVIDER_SITE_OTHER): Payer: Self-pay | Admitting: Family

## 2016-10-28 VITALS — Ht 61.0 in | Wt 105.0 lb

## 2016-10-28 DIAGNOSIS — M25551 Pain in right hip: Secondary | ICD-10-CM | POA: Diagnosis not present

## 2016-10-28 DIAGNOSIS — S92401D Displaced unspecified fracture of right great toe, subsequent encounter for fracture with routine healing: Secondary | ICD-10-CM

## 2016-10-28 MED ORDER — OXYCODONE HCL 5 MG PO TABS: 5.0000 mg | ORAL_TABLET | Freq: Two times a day (BID) | ORAL | 0 refills | Status: DC | PRN

## 2016-10-28 NOTE — Progress Notes (Signed)
Office Visit Note   Patient: Kathryn [name]           Date of Birth: [birth year]           MRN: [id number] Visit Date: 10/28/2016              Requested by: Clovis Riley, L.August Saucer, MD [address] [place], [place] [address] PCP: Clovis Riley, L.August Saucer, MD  Chief Complaint  Patient presents with  . Right Foot - Follow-up  . Right Hip - Follow-up      HPI: The patient is a year old woman seen in follow up for right hip pain as well as right great toe fracture.  Has been working with PT 3 times weekly. States hip pain has resolved. Feels is making strides with PT.   Complaining of medial pain to right great toe. Has a proximal phalanx fracture, R GT. Fracture occured on 09/01/16. In water shoes today, flexible soles. States ongoing issues with great toe pain. Requesting Diluadid prescription.   Feels has been overdoing it with PT.  States her postop shoe is illfitting.  Assessment & Plan: Visit Diagnoses:  1. Closed displaced fracture of phalanx of right great toe with routine healing, unspecified phalanx, subsequent encounter   2. Right hip pain     Plan: Given a Post op shoe on right. Rest. May use ice and elevation. reassurance provided. Will continue with PT. WBAT. Follow up as needed.  Follow-Up Instructions: Return in about 4 weeks (around 11/25/2016), or if symptoms worsen or fail to improve.   Right Hip Exam   Tenderness  The patient is experiencing no tenderness.     Range of Motion  The patient has normal right hip ROM.    no erythema, swelling, bruising. No sign of infection.  Right great toe tender to MTP joint and medial proximal phalanx. No ecchymosis or erythema. Minimal swelling.   Patient is alert, oriented, no adenopathy, well-dressed, normal affect, normal respiratory effort.  Imaging: No results found.  Labs: Lab Results  Component Value Date   HGBA1C 4.9 07/26/2011   ESRSEDRATE 24 (H) 06/12/2008   REPTSTATUS 02/23/2016 FINAL 02/18/2016     GRAMSTAIN NO WBC SEEN NO ORGANISMS SEEN 01/03/2012   CULT NO GROWTH 5 DAYS 02/18/2016    Orders:  No orders of the defined types were placed in this encounter.  Meds ordered this encounter  Medications  . oxyCODONE (ROXICODONE) 5 MG immediate release tablet    Sig: Take 1 tablet (5 mg total) by mouth 2 (two) times daily as needed for severe pain.    Dispense:  20 tablet    Refill:  0     Procedures: No procedures performed  Clinical Data: No additional findings.  ROS:  All other systems negative, except as noted in the HPI. Review of Systems  Constitutional: Negative for chills and fever.  Cardiovascular: Negative for leg swelling.  Musculoskeletal: Positive for arthralgias and gait problem. Negative for joint swelling and myalgias.  Skin: Negative for color change and wound.    Objective: Vital Signs: Ht 5\' 1"  (1.549 m)   Wt 105 lb (47.6 kg)   BMI 19.84 kg/m   Specialty Comments:  No specialty comments available.  PMFS History: Patient Active Problem List   Diagnosis Date Noted  . Intractable pain 09/01/2016  . Nausea vomiting and diarrhea 09/01/2016  . Fall at home   . Right hip pain   . Hepatic encephalopathy (HCC) 02/18/2016  . Visual hallucinations 02/18/2016  .  Hypotension 02/18/2016  . Acute renal failure (ARF) (HCC) 02/18/2016  . Chronic pain 02/18/2016  . Abdominal pain 03/26/2015  . Acute on chronic pancreatitis (HCC) 03/26/2015  . Hypokalemia 03/26/2015  . Palpitation 03/26/2015  . Hypomagnesemia 03/26/2015  . Insomnia 03/17/2012  . Diarrhea 02/23/2012  . Hypoalbuminemia 02/21/2012  . Severe protein-calorie malnutrition (HCC) 10/21/2011  . Ascites 09/22/2011  . Alcoholic liver disease (HCC) 09/22/2011  . EtOH dependence (HCC) 07/27/2011  . Essential hypertension 09/16/2009  . SCOLIOSIS 08/17/2009  . UNSPECIFIED MONONEURITIS OF UPPER LIMB 06/12/2008  . HYPERLIPIDEMIA 02/07/2007  . GERD 02/07/2007  . OSTEOARTHRITIS 02/07/2007  .  Osteoporosis 02/07/2007   Past Medical History:  Diagnosis Date  . Alcoholic liver disease (HCC)   . Arthritis    "all over"  . Ascites 09/22/11   "first time for , now resolved  . Dysrhythmia    "irregular"  . Fracture, humerus 07/2011   right  . GERD (gastroesophageal reflux disease)   . High cholesterol   . History of blood transfusion 07/2011   "when I had elbow surgery"  . Hypertension    not current  . Myocardial infarction (HCC)    NSTEMI 12/2011 in setting of septic shock  . Osteomyelitis (HCC)    left foot  . Osteoporosis   . Pancreatitis several years ago  . Peripheral vascular disease (HCC)   . Staph infection    left foot    Family History  Problem Relation Age of Onset  . Cancer Father        stomach, prostate, skin    Past Surgical History:  Procedure Laterality Date  . AMPUTATION     two toes on left foot  . ARTHROSCOPIC REPAIR ACL     right knee  . BUNIONECTOMY     both feet  . ESOPHAGOGASTRODUODENOSCOPY  09/23/2011   Procedure: ESOPHAGOGASTRODUODENOSCOPY (EGD);  Surgeon: Shirley FriarVincent C. Schooler, MD;  [facility];  Service: Endoscopy;  Laterality: N/A;  . ESOPHAGOGASTRODUODENOSCOPY (EGD) WITH PROPOFOL N/A 08/22/2012   Procedure: ESOPHAGOGASTRODUODENOSCOPY (EGD) WITH PROPOFOL;  Surgeon: Willis ModenaWilliam Outlaw, MD;  Location: [facility] ENDOSCOPY;  Service: Endoscopy;  Laterality: N/A;  . EUS N/A 03/25/2015   Procedure: UPPER ENDOSCOPIC ULTRASOUND (EUS) RADIAL;  Surgeon: Willis ModenaWilliam Outlaw, MD;  Location: [facility] ENDOSCOPY;  Service: Endoscopy;  Laterality: N/A;  . FINE NEEDLE ASPIRATION N/A 03/25/2015   Procedure: FINE NEEDLE ASPIRATION (FNA) RADIAL;  Surgeon: Willis ModenaWilliam Outlaw, MD;  Location: [facility] ENDOSCOPY;  Service: Endoscopy;  Laterality: N/A;  . FRACTURE SURGERY  2013   Right elbow  . JOINT REPLACEMENT     right hip   . LEG SURGERY Left    Correction of unequal leg length  . ORIF HUMERUS FRACTURE  07/26/2011   Procedure: OPEN REDUCTION INTERNAL FIXATION (ORIF) DISTAL HUMERUS  FRACTURE;  Surgeon: Budd PalmerMichael H Handy, MD;  Location: [facility] OR;  Service: Orthopedics;  Laterality: Right;  . ORIF WRIST FRACTURE Left 12/26/2014   Procedure: OPEN REDUCTION INTERNAL FIXATION (ORIF) WRIST FRACTURE;  Surgeon: Cammy CopaScott Gregory Dean, MD;  Location: [facility] OR;  Service: Orthopedics;  Laterality: Left;  . PARACENTESIS  09/22/11   2.2L  . TOTAL HIP ARTHROPLASTY  ~ 2003   right  . TRANSMETATARSAL AMPUTATION  02/16/2012   Procedure: TRANSMETATARSAL AMPUTATION;  Surgeon: Nadara MustardMarcus V Duda, MD;  [facility];  Service: Orthopedics;  Laterality: Left;  . TUBAL LIGATION  1980   Social History   Occupational History  . Not on file.   Social History Main Topics  .  Smoking status: Never Smoker  . Smokeless tobacco: Never Used  . Alcohol use No     Comment: "last drink was day before OR in 07/2011"  . Drug use: No  . Sexual activity: Not Currently

## 2016-10-31 DIAGNOSIS — I2510 Atherosclerotic heart disease of native coronary artery without angina pectoris: Secondary | ICD-10-CM | POA: Diagnosis not present

## 2016-10-31 DIAGNOSIS — S99201D Unspecified physeal fracture of phalanx of right toe, subsequent encounter for fracture with routine healing: Secondary | ICD-10-CM | POA: Diagnosis not present

## 2016-10-31 DIAGNOSIS — I739 Peripheral vascular disease, unspecified: Secondary | ICD-10-CM | POA: Diagnosis not present

## 2016-10-31 DIAGNOSIS — M25551 Pain in right hip: Secondary | ICD-10-CM | POA: Diagnosis not present

## 2016-10-31 DIAGNOSIS — M109 Gout, unspecified: Secondary | ICD-10-CM | POA: Diagnosis not present

## 2016-10-31 DIAGNOSIS — I10 Essential (primary) hypertension: Secondary | ICD-10-CM | POA: Diagnosis not present

## 2016-11-02 DIAGNOSIS — I739 Peripheral vascular disease, unspecified: Secondary | ICD-10-CM | POA: Diagnosis not present

## 2016-11-02 DIAGNOSIS — S99201D Unspecified physeal fracture of phalanx of right toe, subsequent encounter for fracture with routine healing: Secondary | ICD-10-CM | POA: Diagnosis not present

## 2016-11-02 DIAGNOSIS — M25551 Pain in right hip: Secondary | ICD-10-CM | POA: Diagnosis not present

## 2016-11-02 DIAGNOSIS — I10 Essential (primary) hypertension: Secondary | ICD-10-CM | POA: Diagnosis not present

## 2016-11-02 DIAGNOSIS — I2510 Atherosclerotic heart disease of native coronary artery without angina pectoris: Secondary | ICD-10-CM | POA: Diagnosis not present

## 2016-11-02 DIAGNOSIS — M109 Gout, unspecified: Secondary | ICD-10-CM | POA: Diagnosis not present

## 2016-11-04 DIAGNOSIS — I739 Peripheral vascular disease, unspecified: Secondary | ICD-10-CM | POA: Diagnosis not present

## 2016-11-04 DIAGNOSIS — I2510 Atherosclerotic heart disease of native coronary artery without angina pectoris: Secondary | ICD-10-CM | POA: Diagnosis not present

## 2016-11-04 DIAGNOSIS — M25551 Pain in right hip: Secondary | ICD-10-CM | POA: Diagnosis not present

## 2016-11-04 DIAGNOSIS — S99201D Unspecified physeal fracture of phalanx of right toe, subsequent encounter for fracture with routine healing: Secondary | ICD-10-CM | POA: Diagnosis not present

## 2016-11-04 DIAGNOSIS — I10 Essential (primary) hypertension: Secondary | ICD-10-CM | POA: Diagnosis not present

## 2016-11-04 DIAGNOSIS — M109 Gout, unspecified: Secondary | ICD-10-CM | POA: Diagnosis not present

## 2016-11-07 DIAGNOSIS — S99201D Unspecified physeal fracture of phalanx of right toe, subsequent encounter for fracture with routine healing: Secondary | ICD-10-CM | POA: Diagnosis not present

## 2016-11-07 DIAGNOSIS — I10 Essential (primary) hypertension: Secondary | ICD-10-CM | POA: Diagnosis not present

## 2016-11-07 DIAGNOSIS — M109 Gout, unspecified: Secondary | ICD-10-CM | POA: Diagnosis not present

## 2016-11-07 DIAGNOSIS — I739 Peripheral vascular disease, unspecified: Secondary | ICD-10-CM | POA: Diagnosis not present

## 2016-11-07 DIAGNOSIS — M25551 Pain in right hip: Secondary | ICD-10-CM | POA: Diagnosis not present

## 2016-11-07 DIAGNOSIS — I2510 Atherosclerotic heart disease of native coronary artery without angina pectoris: Secondary | ICD-10-CM | POA: Diagnosis not present

## 2016-11-10 DIAGNOSIS — I10 Essential (primary) hypertension: Secondary | ICD-10-CM | POA: Diagnosis not present

## 2016-11-10 DIAGNOSIS — I739 Peripheral vascular disease, unspecified: Secondary | ICD-10-CM | POA: Diagnosis not present

## 2016-11-10 DIAGNOSIS — M109 Gout, unspecified: Secondary | ICD-10-CM | POA: Diagnosis not present

## 2016-11-10 DIAGNOSIS — M25551 Pain in right hip: Secondary | ICD-10-CM | POA: Diagnosis not present

## 2016-11-10 DIAGNOSIS — S99201D Unspecified physeal fracture of phalanx of right toe, subsequent encounter for fracture with routine healing: Secondary | ICD-10-CM | POA: Diagnosis not present

## 2016-11-10 DIAGNOSIS — I2510 Atherosclerotic heart disease of native coronary artery without angina pectoris: Secondary | ICD-10-CM | POA: Diagnosis not present

## 2016-11-15 DIAGNOSIS — S99201D Unspecified physeal fracture of phalanx of right toe, subsequent encounter for fracture with routine healing: Secondary | ICD-10-CM | POA: Diagnosis not present

## 2016-11-15 DIAGNOSIS — I2510 Atherosclerotic heart disease of native coronary artery without angina pectoris: Secondary | ICD-10-CM | POA: Diagnosis not present

## 2016-11-15 DIAGNOSIS — M25551 Pain in right hip: Secondary | ICD-10-CM | POA: Diagnosis not present

## 2016-11-15 DIAGNOSIS — I739 Peripheral vascular disease, unspecified: Secondary | ICD-10-CM | POA: Diagnosis not present

## 2016-11-15 DIAGNOSIS — I10 Essential (primary) hypertension: Secondary | ICD-10-CM | POA: Diagnosis not present

## 2016-11-15 DIAGNOSIS — M109 Gout, unspecified: Secondary | ICD-10-CM | POA: Diagnosis not present

## 2016-11-17 DIAGNOSIS — S99201D Unspecified physeal fracture of phalanx of right toe, subsequent encounter for fracture with routine healing: Secondary | ICD-10-CM | POA: Diagnosis not present

## 2016-11-17 DIAGNOSIS — I2510 Atherosclerotic heart disease of native coronary artery without angina pectoris: Secondary | ICD-10-CM | POA: Diagnosis not present

## 2016-11-17 DIAGNOSIS — M109 Gout, unspecified: Secondary | ICD-10-CM | POA: Diagnosis not present

## 2016-11-17 DIAGNOSIS — I739 Peripheral vascular disease, unspecified: Secondary | ICD-10-CM | POA: Diagnosis not present

## 2016-11-17 DIAGNOSIS — M25551 Pain in right hip: Secondary | ICD-10-CM | POA: Diagnosis not present

## 2016-11-17 DIAGNOSIS — I10 Essential (primary) hypertension: Secondary | ICD-10-CM | POA: Diagnosis not present

## 2016-12-20 DIAGNOSIS — Z1231 Encounter for screening mammogram for malignant neoplasm of breast: Secondary | ICD-10-CM | POA: Diagnosis not present

## 2016-12-26 ENCOUNTER — Other Ambulatory Visit: Payer: Self-pay | Admitting: Gastroenterology

## 2016-12-26 DIAGNOSIS — K7460 Unspecified cirrhosis of liver: Secondary | ICD-10-CM

## 2017-01-06 ENCOUNTER — Other Ambulatory Visit: Payer: Self-pay

## 2017-01-13 ENCOUNTER — Ambulatory Visit
Admission: RE | Admit: 2017-01-13 | Discharge: 2017-01-13 | Disposition: A | Discharge status: Home / Self Care | Payer: Medicare HMO | Source: Ambulatory Visit | Attending: Gastroenterology | Admitting: Gastroenterology

## 2017-01-13 DIAGNOSIS — K7460 Unspecified cirrhosis of liver: Secondary | ICD-10-CM

## 2017-01-19 ENCOUNTER — Other Ambulatory Visit: Payer: Self-pay | Admitting: Gastroenterology

## 2017-01-19 DIAGNOSIS — K7460 Unspecified cirrhosis of liver: Secondary | ICD-10-CM | POA: Diagnosis not present

## 2017-01-19 DIAGNOSIS — R1310 Dysphagia, unspecified: Secondary | ICD-10-CM

## 2017-01-24 ENCOUNTER — Other Ambulatory Visit: Payer: Self-pay

## 2017-02-01 DIAGNOSIS — Z0000 Encounter for general adult medical examination without abnormal findings: Secondary | ICD-10-CM | POA: Diagnosis not present

## 2017-02-01 DIAGNOSIS — M419 Scoliosis, unspecified: Secondary | ICD-10-CM | POA: Diagnosis not present

## 2017-02-01 DIAGNOSIS — Z23 Encounter for immunization: Secondary | ICD-10-CM | POA: Diagnosis not present

## 2017-02-01 DIAGNOSIS — M810 Age-related osteoporosis without current pathological fracture: Secondary | ICD-10-CM | POA: Diagnosis not present

## 2017-02-01 DIAGNOSIS — G2581 Restless legs syndrome: Secondary | ICD-10-CM | POA: Diagnosis not present

## 2017-02-02 ENCOUNTER — Other Ambulatory Visit: Payer: Self-pay

## 2017-02-07 ENCOUNTER — Ambulatory Visit
Admission: RE | Admit: 2017-02-07 | Discharge: 2017-02-07 | Disposition: A | Discharge status: Home / Self Care | Payer: Medicare HMO | Source: Ambulatory Visit | Attending: Gastroenterology | Admitting: Gastroenterology

## 2017-02-07 DIAGNOSIS — K449 Diaphragmatic hernia without obstruction or gangrene: Secondary | ICD-10-CM | POA: Diagnosis not present

## 2017-02-07 DIAGNOSIS — K219 Gastro-esophageal reflux disease without esophagitis: Secondary | ICD-10-CM | POA: Diagnosis not present

## 2017-02-07 DIAGNOSIS — R1310 Dysphagia, unspecified: Secondary | ICD-10-CM

## 2017-03-01 ENCOUNTER — Ambulatory Visit (INDEPENDENT_AMBULATORY_CARE_PROVIDER_SITE_OTHER): Payer: Medicare HMO | Admitting: Family

## 2017-03-01 ENCOUNTER — Ambulatory Visit (INDEPENDENT_AMBULATORY_CARE_PROVIDER_SITE_OTHER): Payer: Medicare HMO

## 2017-03-01 VITALS — Ht 61.0 in | Wt 105.0 lb

## 2017-03-01 DIAGNOSIS — M25571 Pain in right ankle and joints of right foot: Secondary | ICD-10-CM

## 2017-03-01 DIAGNOSIS — L819 Disorder of pigmentation, unspecified: Secondary | ICD-10-CM | POA: Diagnosis not present

## 2017-03-01 MED ORDER — GABAPENTIN 100 MG PO CAPS: 100.0000 mg | ORAL_CAPSULE | Freq: Three times a day (TID) | ORAL | 1 refills | Status: DC

## 2017-03-01 NOTE — Progress Notes (Signed)
Office Visit Note   Patient: Kathryn [name]           Date of Birth: 1950/10/11           MRN: [id number] Visit Date: 03/01/2017              Requested by: Clovis RileyMitchell, L.August Saucerean, MD [address] [place], [place] [address] PCP: Clovis RileyMitchell, L.August Saucerean, MD  Chief Complaint  Patient presents with  . Right Foot - Pain    Closed displaced fx phalanx right great toe      HPI: Patient is a 66 year old woman seen today for evaluation of right foot pain. Ongoing for several weeks. "I cannot do anything or get anything done because my foot hurts so bad." no injury. Worried about darkening of skin to second toe and over dorsum of foot.   Did have a proximal phalanx of right GT fracture on right back in June. Today in water shoes, which she wears regularly. Does have history of chronic pain. Has used Gabapentin in past, has not been taking recently. Does states that Dilaudid has worked for her in the past.   Throbbing, shooting pain over all toes and dorsum of foot. Some intermittent plantar numbness. Pain keeps her awake at night. No back pain. No weakness.   Assessment & Plan: Visit Diagnoses:  1. Pain in right ankle and joints of right foot   2. Discoloration of skin of toe     Plan: ?ischemic pain vs neuropathic pain.  will have her see vascular surgery for ABIs and evaluation as she has had a transmetatarsal amputation on left due to issues with circulation. Will restart her Gabapentin as well.   Follow-Up Instructions: Return if symptoms worsen or fail to improve.   Ortho Exam  Patient is alert, oriented, no adenopathy, well-dressed, normal affect, normal respiratory effort. On examination of right foot tenderness over dorsum of foot. Poorly localized. Foot is normothermic. Palpable DP and PT pulse on right. Some darkening of second toe and less so over dorsum. No ulcerations. No erythema. No hypersensitivity to light touch. No dystrophic changes.   Imaging: No results found. No  images are attached to the encounter.  Labs: Lab Results  Component Value Date   HGBA1C 4.9 07/26/2011   ESRSEDRATE 24 (H) 06/12/2008   REPTSTATUS 02/23/2016 FINAL 02/18/2016   GRAMSTAIN NO WBC SEEN NO ORGANISMS SEEN 01/03/2012   CULT NO GROWTH 5 DAYS 02/18/2016    Orders:  Orders Placed This Encounter  Procedures  . XR Foot 2 Views Right  . Ambulatory referral to Vascular Surgery   Meds ordered this encounter  Medications  . gabapentin (NEURONTIN) 100 MG capsule    Sig: Take 1 capsule (100 mg total) 3 (three) times daily by mouth.    Dispense:  90 capsule    Refill:  1     Procedures: No procedures performed  Clinical Data: No additional findings.  ROS:  All other systems negative, except as noted in the HPI. Review of Systems  Constitutional: Negative for chills and fever.  Musculoskeletal: Positive for arthralgias. Negative for myalgias.  Skin: Positive for color change. Negative for wound.  Neurological: Positive for numbness. Negative for weakness.    Objective: Vital Signs: Ht 5\' 1"  (1.549 m)   Wt 105 lb (47.6 kg)   BMI 19.84 kg/m   Specialty Comments:  No specialty comments available.  PMFS History: Patient Active Problem List   Diagnosis Date Noted  . Intractable  pain 09/01/2016  . Nausea vomiting and diarrhea 09/01/2016  . Fall at home   . Right hip pain   . Hepatic encephalopathy (HCC) 02/18/2016  . Visual hallucinations 02/18/2016  . Hypotension 02/18/2016  . Acute renal failure (ARF) (HCC) 02/18/2016  . Chronic pain 02/18/2016  . Abdominal pain 03/26/2015  . Acute on chronic pancreatitis (HCC) 03/26/2015  . Hypokalemia 03/26/2015  . Palpitation 03/26/2015  . Hypomagnesemia 03/26/2015  . Insomnia 03/17/2012  . Diarrhea 02/23/2012  . Hypoalbuminemia 02/21/2012  . Severe protein-calorie malnutrition (HCC) 10/21/2011  . Ascites 09/22/2011  . Alcoholic liver disease (HCC) 09/22/2011  . EtOH dependence (HCC) 07/27/2011  . Essential  hypertension 09/16/2009  . SCOLIOSIS 08/17/2009  . UNSPECIFIED MONONEURITIS OF UPPER LIMB 06/12/2008  . HYPERLIPIDEMIA 02/07/2007  . GERD 02/07/2007  . OSTEOARTHRITIS 02/07/2007  . Osteoporosis 02/07/2007   Past Medical History:  Diagnosis Date  . Alcoholic liver disease (HCC)   . Arthritis    "all over"  . Ascites 09/22/11   "first time for , now resolved  . Dysrhythmia    "irregular"  . Fracture, humerus 07/2011   right  . GERD (gastroesophageal reflux disease)   . High cholesterol   . History of blood transfusion 07/2011   "when I had elbow surgery"  . Hypertension    not current  . Myocardial infarction (HCC)    NSTEMI 12/2011 in setting of septic shock  . Osteomyelitis (HCC)    left foot  . Osteoporosis   . Pancreatitis several years ago  . Peripheral vascular disease (HCC)   . Staph infection    left foot    Family History  Problem Relation Age of Onset  . Cancer Father        stomach, prostate, skin    Past Surgical History:  Procedure Laterality Date  . AMPUTATION     two toes on left foot  . ARTHROSCOPIC REPAIR ACL     right knee  . BUNIONECTOMY     both feet  . FRACTURE SURGERY  2013   Right elbow  . JOINT REPLACEMENT     right hip   . LEG SURGERY Left    Correction of unequal leg length  . PARACENTESIS  09/22/11   2.2L  . TOTAL HIP ARTHROPLASTY  ~ 2003   right  . TUBAL LIGATION  1980   Social History   Occupational History  . Not on file  Tobacco Use  . Smoking status: Never Smoker  . Smokeless tobacco: Never Used  Substance and Sexual Activity  . Alcohol use: No    Alcohol/week: 0.0 oz    Comment: "last drink was day before OR in 07/2011"  . Drug use: No  . Sexual activity: Not Currently

## 2017-03-08 ENCOUNTER — Encounter: Payer: Self-pay | Admitting: Vascular Surgery

## 2017-03-08 ENCOUNTER — Ambulatory Visit (INDEPENDENT_AMBULATORY_CARE_PROVIDER_SITE_OTHER): Payer: Medicare HMO | Admitting: Family

## 2017-03-08 VITALS — BP 101/75 | HR 89 | Temp 98.4°F | Resp 16 | Ht 61.0 in | Wt 109.0 lb

## 2017-03-08 DIAGNOSIS — M25571 Pain in right ankle and joints of right foot: Secondary | ICD-10-CM

## 2017-03-08 NOTE — Progress Notes (Signed)
Patient name: Kathryn [name] MRN: [id number] DOB: 10-02-1950 Sex: female   REASON FOR CONSULT:    Pain in right foot with darkening of the right second toe.  The consult is requested by Barnie DelErin Zamora.   HPI:   Kathryn [name] is a pleasant 66 y.o. female, who was referred for evaluation of right foot pain and discoloration of the right second toe.   I have reviewed the [facility] from [facility].  The patient was seen on 03/01/2017 by the nurse practitioner.  The patient was noted to have pain in the foot and discoloration of the right second toe.  The patient is on Neurontin for neuropathy.  On my history, the patient broke her right great toe about 4 months ago.  This ultimately healed.  On her last follow-up visit she was complaining of pain over the first metatarsal on the right foot and there was some mild discoloration of the right second toe.  This reason she was sent for vascular consultation.  She continues to have pain on the medial aspect of her right foot adjacent to the metatarsal head.  Of note she underwent a previous left transmetatarsal amputation 4 years ago.  She really does not have any significant risk factors for peripheral vascular disease.  She denies any history of diabetes, hypertension, hypercholesterolemia, family history of premature cardiovascular disease or tobacco use.  She denies any history of claudication, rest pain, or nonhealing ulcers.  Past Medical History:  Diagnosis Date  . Alcoholic liver disease (HCC)   . Arthritis    "all over"  . Ascites 09/22/11   "first time for , now resolved  . Dysrhythmia    "irregular"  . Fracture, humerus 07/2011   right  . GERD (gastroesophageal reflux disease)   . High cholesterol   . History of blood transfusion 07/2011   "when I had elbow surgery"  . Hypertension    not current  . Myocardial infarction (HCC)    NSTEMI 12/2011 in setting of septic shock  . Osteomyelitis (HCC)    left foot  . Osteoporosis     . Pancreatitis several years ago  . Peripheral vascular disease (HCC)   . Staph infection    left foot    Family History  Problem Relation Age of Onset  . Cancer Father        stomach, prostate, skin    SOCIAL HISTORY: Social History   Socioeconomic History  . Marital status: Widowed    Spouse name: Not on file  . Number of children: Not on file  . Years of education: Not on file  . Highest education level: Not on file  Social Needs  . Financial resource strain: Not on file  . Food insecurity - worry: Not on file  . Food insecurity - inability: Not on file  . Transportation needs - medical: Not on file  . Transportation needs - non-medical: Not on file  Occupational History  . Not on file  Tobacco Use  . Smoking status: Never Smoker  . Smokeless tobacco: Never Used  Substance and Sexual Activity  . Alcohol use: No    Alcohol/week: 0.0 oz    Comment: "last drink was day before OR in 07/2011"  . Drug use: No  . Sexual activity: Not Currently  Other Topics Concern  . Not on file  Social History Narrative   Widowed 2013   2 children in the area    Allergies  Allergen Reactions  .  Codeine Hives    Causes blisters 09/02/16:  Tolerating Morphine  . Tramadol Other (See Comments)    Loss of consciousness   . Tylenol [Acetaminophen] Rash    Breaks out in blisters all over her body.    Current Outpatient Medications  Medication Sig Dispense Refill  . alendronate (FOSAMAX) 70 MG tablet Take 70 mg by mouth once a week. Saturday  12  . allopurinol (ZYLOPRIM) 100 MG tablet Take 100 mg by mouth 2 (two) times daily.    . Calcium Carb-Cholecalciferol (CALCIUM 600 + D PO) Take 1 tablet by mouth 2 (two) times daily.    . folic acid (FOLVITE) 400 MCG tablet Take 400 mcg by mouth daily.    Marland Kitchen gabapentin (NEURONTIN) 100 MG capsule Take 1 capsule (100 mg total) 3 (three) times daily by mouth. 90 capsule 1  . HYDROmorphone (DILAUDID) 2 MG tablet     . lactulose (CHRONULAC) 10  GM/15ML solution Take 5 mLs (3.337 g total) by mouth 3 (three) times daily. 240 mL 0  . mirtazapine (REMERON) 15 MG tablet Take 1 tablet (15 mg total) by mouth at bedtime. 30 tablet 0  . oxyCODONE (ROXICODONE) 5 MG immediate release tablet Take 1 tablet (5 mg total) by mouth 2 (two) times daily as needed for severe pain. 20 tablet 0  . potassium chloride SA (K-DUR,KLOR-CON) 20 MEQ tablet Take 20 mEq by mouth daily.    . propranolol (INDERAL) 10 MG tablet Take 0.5 tablets (5 mg total) by mouth 2 (two) times daily. 60 tablet 0  . spironolactone (ALDACTONE) 50 MG tablet Take 50 mg by mouth daily.    . Thiamine HCl (VITAMIN B-1) 250 MG tablet Take 250 mg by mouth daily.     No current facility-administered medications for this visit.     REVIEW OF SYSTEMS:  [X]  denotes positive finding, [ ]  denotes negative finding Cardiac  Comments:  Chest pain or chest pressure:    Shortness of breath upon exertion:    Short of breath when lying flat:    Irregular heart rhythm:        Vascular    Pain in calf, thigh, or hip brought on by ambulation:    Pain in feet at night that wakes you up from your sleep:  X   Blood clot in your veins:    Leg swelling:         Pulmonary    Oxygen at home:    Productive cough:     Wheezing:         Neurologic    Sudden weakness in arms or legs:     Sudden numbness in arms or legs:     Sudden onset of difficulty speaking or slurred speech:    Temporary loss of vision in one eye:     Problems with dizziness:         Gastrointestinal    Blood in stool:     Vomited blood:         Genitourinary    Burning when urinating:     Blood in urine:        Psychiatric    Major depression:         Hematologic    Bleeding problems:    Problems with blood clotting too easily:        Skin    Rashes or ulcers:        Constitutional    Fever or chills:     PHYSICAL  EXAM:   Vitals:   03/08/17 1445  BP: 101/75  Pulse: 89  Resp: 16  Temp: 98.4 F (36.9 C)    TempSrc: Oral  SpO2: 94%  Weight: 109 lb (49.4 kg)  Height: 5\' 1"  (1.549 m)    GENERAL: The patient is a well-nourished female, in no acute distress. The vital signs are documented above. CARDIAC: There is a regular rate and rhythm.  VASCULAR: I do not detect carotid bruits. She has palpable femoral, popliteal, dorsalis pedis, and posterior tibial pulses bilaterally. She has no significant lower extremity swelling. PULMONARY: There is good air exchange bilaterally without wheezing or rales. ABDOMEN: Soft and non-tender with normal pitched bowel sounds.  I do not palpate an abdominal aortic aneurysm. MUSCULOSKELETAL: She has a left transmetatarsal amputation which is healed. NEUROLOGIC: No focal weakness or paresthesias are detected. SKIN: There is some slight discoloration to the left second toe.  The toe does not look mottled. PSYCHIATRIC: The patient has a normal affect.  DATA:    CT SCAN ABDOMEN AND PELVIS: I reviewed the CT of the abdomen and pelvis that was done in December 2016.  This showed no evidence of aortoiliac occlusive disease.  MEDICAL ISSUES:   RIGHT FOOT PAIN: The patient describes pain on the medial aspect of the right foot adjacent to the first toe.  There is no erythema or swelling to suggest infection.  There is some slight discoloration of the right second toe.  He has palpable pedal pulses.  There is a small chance that she had a small embolic event to the second toe however currently the toe has minimal discoloration and she has palpable pedal pulses.  Therefore at this point I would not recommend formal arteriography.  She did have a CT scan in December 2016 which showed no evidence of aortoiliac occlusive disease.  Her pain is over the metatarsal head and does not sound like "rest pain."  I will be happy to see her back if the discoloration changes or she develops any new vascular issues.  Waverly Ferrarihristopher Iktan Aikman [facility]  [phone]

## 2017-03-23 ENCOUNTER — Telehealth (INDEPENDENT_AMBULATORY_CARE_PROVIDER_SITE_OTHER): Payer: Self-pay | Admitting: Family

## 2017-03-23 NOTE — Telephone Encounter (Signed)
Patient called asked if Denny Peonrin would call her concerning the next plan of care for her right foot. Patient advised it is not a vascular or bone problem. Patient advised [facility] is sending a fax and asked that the form be faxed back as soon as possible. The number to contact patient is [phone]

## 2017-03-23 NOTE — Telephone Encounter (Signed)
Patient scheduled with Dr. Lajoyce Cornersuda 04/06/17 at 9:30am

## 2017-03-30 NOTE — Telephone Encounter (Signed)
Patient called stating that [facility] is waiting for a response back to their fax regarding the patient's Gabapentin.  Patient's CB#[phone].  Thank you.

## 2017-03-31 ENCOUNTER — Other Ambulatory Visit (INDEPENDENT_AMBULATORY_CARE_PROVIDER_SITE_OTHER): Payer: Self-pay

## 2017-03-31 MED ORDER — GABAPENTIN 100 MG PO CAPS: 100.0000 mg | ORAL_CAPSULE | Freq: Three times a day (TID) | ORAL | 1 refills | Status: DC

## 2017-03-31 NOTE — Telephone Encounter (Signed)
refilled 

## 2017-04-05 DIAGNOSIS — M810 Age-related osteoporosis without current pathological fracture: Secondary | ICD-10-CM | POA: Diagnosis not present

## 2017-04-06 ENCOUNTER — Ambulatory Visit (INDEPENDENT_AMBULATORY_CARE_PROVIDER_SITE_OTHER): Payer: Medicare HMO | Admitting: Orthopedic Surgery

## 2017-04-14 ENCOUNTER — Telehealth (INDEPENDENT_AMBULATORY_CARE_PROVIDER_SITE_OTHER): Payer: Self-pay | Admitting: Radiology

## 2017-04-14 ENCOUNTER — Emergency Department (HOSPITAL_COMMUNITY): Payer: Medicare HMO

## 2017-04-14 ENCOUNTER — Encounter (HOSPITAL_COMMUNITY): Payer: Self-pay

## 2017-04-14 ENCOUNTER — Emergency Department (HOSPITAL_COMMUNITY)
Admission: EM | Admit: 2017-04-14 | Discharge: 2017-04-14 | Disposition: A | Discharge status: Home / Self Care | Payer: Medicare HMO | Attending: Emergency Medicine | Admitting: Emergency Medicine

## 2017-04-14 DIAGNOSIS — M7989 Other specified soft tissue disorders: Secondary | ICD-10-CM | POA: Insufficient documentation

## 2017-04-14 DIAGNOSIS — G8911 Acute pain due to trauma: Secondary | ICD-10-CM | POA: Diagnosis not present

## 2017-04-14 DIAGNOSIS — Z96641 Presence of right artificial hip joint: Secondary | ICD-10-CM | POA: Diagnosis not present

## 2017-04-14 DIAGNOSIS — I10 Essential (primary) hypertension: Secondary | ICD-10-CM | POA: Insufficient documentation

## 2017-04-14 DIAGNOSIS — M25572 Pain in left ankle and joints of left foot: Secondary | ICD-10-CM | POA: Diagnosis not present

## 2017-04-14 DIAGNOSIS — R609 Edema, unspecified: Secondary | ICD-10-CM | POA: Diagnosis not present

## 2017-04-14 DIAGNOSIS — Z79899 Other long term (current) drug therapy: Secondary | ICD-10-CM | POA: Insufficient documentation

## 2017-04-14 LAB — BASIC METABOLIC PANEL
ANION GAP: 13 (ref 5–15)
BUN: 7 mg/dL (ref 6–20)
CHLORIDE: 102 mmol/L (ref 101–111)
CO2: 24 mmol/L (ref 22–32)
Calcium: 9.2 mg/dL (ref 8.9–10.3)
Creatinine, Ser: 0.76 mg/dL (ref 0.44–1.00)
GFR calc Af Amer: >60 mL/min (ref >60)
GLUCOSE: 107 mg/dL — AB (ref 65–99)
POTASSIUM: 4.2 mmol/L (ref 3.5–5.1)
Sodium: 139 mmol/L (ref 135–145)

## 2017-04-14 LAB — CBC WITH DIFFERENTIAL/PLATELET
BASOS ABS: 0 10*3/uL (ref 0.0–0.1)
Basophils Relative: 0 %
EOS PCT: 1 %
Eosinophils Absolute: 0 10*3/uL (ref 0.0–0.7)
HCT: 35.3 % — ABNORMAL LOW (ref 36.0–46.0)
HEMOGLOBIN: 11.3 g/dL — AB (ref 12.0–15.0)
LYMPHS ABS: 0.8 10*3/uL (ref 0.7–4.0)
LYMPHS PCT: 25 %
MCH: 30.1 pg (ref 26.0–34.0)
MCHC: 32 g/dL (ref 30.0–36.0)
MCV: 93.9 fL (ref 78.0–100.0)
Monocytes Absolute: 0.4 10*3/uL (ref 0.1–1.0)
Monocytes Relative: 12 %
NEUTROS ABS: 2 10*3/uL (ref 1.7–7.7)
NEUTROS PCT: 62 %
PLATELETS: 113 10*3/uL — AB (ref 150–400)
RBC: 3.76 MIL/uL — AB (ref 3.87–5.11)
RDW: 14 % (ref 11.5–15.5)
WBC: 3.2 10*3/uL — AB (ref 4.0–10.5)

## 2017-04-14 MED ORDER — CEPHALEXIN 500 MG PO CAPS: 500.0000 mg | ORAL_CAPSULE | Freq: Four times a day (QID) | ORAL | 0 refills | Status: AC

## 2017-04-14 MED ORDER — CEPHALEXIN 250 MG PO CAPS
500.0000 mg | ORAL_CAPSULE | Freq: Once | ORAL | Status: AC
  Administered 2017-04-14: 500 mg via ORAL
  Filled 2017-04-14: qty 2

## 2017-04-14 NOTE — ED Notes (Signed)
Pt in xray

## 2017-04-14 NOTE — Discharge Instructions (Signed)
You have some swelling of the left lower leg that may represent some inflammation or early infection.  Please take all of the antibiotics.  Return if you are worse, especially increased redness, swelling, or fever.

## 2017-04-14 NOTE — ED Provider Notes (Signed)
[facility] Provider Note   CSN: [id number] Arrival date & time: 04/14/17  1012     History   Chief Complaint Chief Complaint  Patient presents with  . Leg Swelling    HPI Kathryn [name] is a 66 y.o. female.  HPI   66 year old female history of osteomyelitis in her left foot, right foot pain with valuation pending from Dr.Duda to, who presents today with some swelling of the lateral left ankle.  Reports no injury, proximal redness, or fever.  She has a history of gout but has not had any definitive recent flares. Past Medical History:  Diagnosis Date  . Alcoholic liver disease (HCC)   . Arthritis    "all over"  . Ascites 09/22/11   "first time for , now resolved  . Dysrhythmia    "irregular"  . Fracture, humerus 07/2011   right  . GERD (gastroesophageal reflux disease)   . High cholesterol   . History of blood transfusion 07/2011   "when I had elbow surgery"  . Hypertension    not current  . Myocardial infarction (HCC)    NSTEMI 12/2011 in setting of septic shock  . Osteomyelitis (HCC)    left foot  . Osteoporosis   . Pancreatitis several years ago  . Peripheral vascular disease (HCC)   . Staph infection    left foot    Patient Active Problem List   Diagnosis Date Noted  . Intractable pain 09/01/2016  . Nausea vomiting and diarrhea 09/01/2016  . Fall at home   . Right hip pain   . Hepatic encephalopathy (HCC) 02/18/2016  . Visual hallucinations 02/18/2016  . Hypotension 02/18/2016  . Acute renal failure (ARF) (HCC) 02/18/2016  . Chronic pain 02/18/2016  . Abdominal pain 03/26/2015  . Acute on chronic pancreatitis (HCC) 03/26/2015  . Hypokalemia 03/26/2015  . Palpitation 03/26/2015  . Hypomagnesemia 03/26/2015  . Insomnia 03/17/2012  . Diarrhea 02/23/2012  . Hypoalbuminemia 02/21/2012  . Severe protein-calorie malnutrition (HCC) 10/21/2011  . Ascites 09/22/2011  . Alcoholic liver disease (HCC) 09/22/2011  . EtOH  dependence (HCC) 07/27/2011  . Essential hypertension 09/16/2009  . SCOLIOSIS 08/17/2009  . UNSPECIFIED MONONEURITIS OF UPPER LIMB 06/12/2008  . HYPERLIPIDEMIA 02/07/2007  . GERD 02/07/2007  . OSTEOARTHRITIS 02/07/2007  . Osteoporosis 02/07/2007    Past Surgical History:  Procedure Laterality Date  . AMPUTATION     two toes on left foot  . ARTHROSCOPIC REPAIR ACL     right knee  . BUNIONECTOMY     both feet  . ESOPHAGOGASTRODUODENOSCOPY  09/23/2011   Procedure: ESOPHAGOGASTRODUODENOSCOPY (EGD);  Surgeon: Shirley FriarVincent C. Schooler, MD;  Location: [facility] ENDOSCOPY;  Service: Endoscopy;  Laterality: N/A;  . ESOPHAGOGASTRODUODENOSCOPY (EGD) WITH PROPOFOL N/A 08/22/2012   Procedure: ESOPHAGOGASTRODUODENOSCOPY (EGD) WITH PROPOFOL;  Surgeon: Willis ModenaWilliam Outlaw, MD;  Location: [facility] ENDOSCOPY;  Service: Endoscopy;  Laterality: N/A;  . EUS N/A 03/25/2015   Procedure: UPPER ENDOSCOPIC ULTRASOUND (EUS) RADIAL;  Surgeon: Willis ModenaWilliam Outlaw, MD;  Location: [facility] ENDOSCOPY;  Service: Endoscopy;  Laterality: N/A;  . FINE NEEDLE ASPIRATION N/A 03/25/2015   Procedure: FINE NEEDLE ASPIRATION (FNA) RADIAL;  Surgeon: Willis ModenaWilliam Outlaw, MD;  Location: [facility] ENDOSCOPY;  Service: Endoscopy;  Laterality: N/A;  . FRACTURE SURGERY  2013   Right elbow  . JOINT REPLACEMENT     right hip   . LEG SURGERY Left    Correction of unequal leg length  . ORIF HUMERUS FRACTURE  07/26/2011   Procedure: OPEN REDUCTION INTERNAL  FIXATION (ORIF) DISTAL HUMERUS FRACTURE;  Surgeon: Budd Palmer, MD;  Location: [facility] OR;  Service: Orthopedics;  Laterality: Right;  . ORIF WRIST FRACTURE Left 12/26/2014   Procedure: OPEN REDUCTION INTERNAL FIXATION (ORIF) WRIST FRACTURE;  Surgeon: Cammy Copa, MD;  Location: [facility] OR;  Service: Orthopedics;  Laterality: Left;  . PARACENTESIS  09/22/11   2.2L  . TOTAL HIP ARTHROPLASTY  ~ 2003   right  . TRANSMETATARSAL AMPUTATION  02/16/2012   Procedure: TRANSMETATARSAL AMPUTATION;  Surgeon: Nadara Mustard, MD;  Location: [facility] OR;   Service: Orthopedics;  Laterality: Left;  . TUBAL LIGATION  1980    OB History    No data available       Home Medications    Prior to Admission medications   Medication Sig Start Date End Date Taking? Authorizing Provider  alendronate (FOSAMAX) 70 MG tablet Take 70 mg by mouth once a week. Saturday 03/04/15   [provider]  allopurinol (ZYLOPRIM) 100 MG tablet Take 100 mg by mouth 2 (two) times daily.    [provider]  Calcium Carb-Cholecalciferol (CALCIUM 600 + D PO) Take 1 tablet by mouth 2 (two) times daily.    [provider]  folic acid (FOLVITE) 400 MCG tablet Take 400 mcg by mouth daily.    [provider]  gabapentin (NEURONTIN) 100 MG capsule Take 1 capsule (100 mg total) by mouth 3 (three) times daily. 03/31/17   Nadara Mustard, MD  HYDROmorphone (DILAUDID) 2 MG tablet  09/19/16   [provider]  lactulose (CHRONULAC) 10 GM/15ML solution Take 5 mLs (3.337 g total) by mouth 3 (three) times daily. 09/26/16   Adonis Huguenin, NP  mirtazapine (REMERON) 15 MG tablet Take 1 tablet (15 mg total) by mouth at bedtime. 03/26/12   Brent Bulla, MD  oxyCODONE (ROXICODONE) 5 MG immediate release tablet Take 1 tablet (5 mg total) by mouth 2 (two) times daily as needed for severe pain. 10/28/16   Adonis Huguenin, NP  potassium chloride SA (K-DUR,KLOR-CON) 20 MEQ tablet Take 20 mEq by mouth daily. 01/29/16   [provider]  propranolol (INDERAL) 10 MG tablet Take 0.5 tablets (5 mg total) by mouth 2 (two) times daily. 09/04/16   Osvaldo Shipper, MD  spironolactone (ALDACTONE) 50 MG tablet Take 50 mg by mouth daily.    [provider]  Thiamine HCl (VITAMIN B-1) 250 MG tablet Take 250 mg by mouth daily.    [provider]    Family History Family History  Problem Relation Age of Onset  . Cancer Father        stomach, prostate, skin    Social History Social History   Tobacco Use  . Smoking status: Never Smoker  .  Smokeless tobacco: Never Used  Substance Use Topics  . Alcohol use: No    Alcohol/week: 0.0 oz    Comment: "last drink was day before OR in 07/2011"  . Drug use: No     Allergies   Codeine; Tramadol; and Tylenol [acetaminophen]   Review of Systems Review of Systems  All other systems reviewed and are negative.    Physical Exam Updated Vital Signs BP 138/84 (BP Location: Right Arm)   Pulse 99   Temp 99 F (37.2 C) (Oral)   Resp 16   Ht 1.499 m (4\' 11" )   Wt 47.6 kg (105 lb)   SpO2 99%   BMI 21.21 kg/m   Physical Exam  Constitutional: She is  oriented to person, place, and time. She appears well-developed and well-nourished. No distress.  HENT:  Head: Normocephalic and atraumatic.  Right Ear: External ear normal.  Left Ear: External ear normal.  Eyes: Pupils are equal, round, and reactive to light.  Neck: Normal range of motion.  Cardiovascular: Normal rate and regular rhythm.  Pulmonary/Chest: Effort normal and breath sounds normal.  Abdominal: Soft.  Musculoskeletal: Normal range of motion.  Left foot status post partial amputation Left ankle with some mild erythema on the lateral aspect with some swelling.  Posteriorly there is no cord or swelling in the calf and medially there is no redness or swelling.  Pulses are intact Right foot has some medial tenderness at the great toe metatarsal tarsal joint.  Patient reports this is baseline  Neurological: She is alert and oriented to person, place, and time.  Skin: Skin is warm and dry. There is erythema.  Psychiatric: She has a normal mood and affect.  Nursing note and vitals reviewed.    ED Treatments / Results  Labs (all labs ordered are listed, but only abnormal results are displayed) Labs Reviewed  CBC WITH DIFFERENTIAL/PLATELET - Abnormal; Notable for the following components:      Result Value   WBC 3.2 (*)    RBC 3.76 (*)    Hemoglobin 11.3 (*)    HCT 35.3 (*)    All other components within normal  limits  BASIC METABOLIC PANEL - Abnormal; Notable for the following components:   Glucose, Bld 107 (*)    All other components within normal limits    EKG  EKG Interpretation None       Radiology Dg Ankle Complete Left  Result Date: 04/14/2017 CLINICAL DATA:  Left ankle pain and swelling.  No injury. EXAM: LEFT ANKLE COMPLETE - 3+ VIEW COMPARISON:  Left foot x-rays dated July 09, 2015. FINDINGS: There is no evidence of fracture, dislocation, or joint effusion. Prior transmetatarsal amputation. There is no evidence of arthropathy or other focal bone abnormality. Osteopenia. Mild soft tissue swelling over the lateral malleolus. IMPRESSION: No acute osseous abnormality. Electronically Signed   By: Obie DredgeWilliam T Derry M.D.   On: 04/14/2017 11:07    Procedures Procedures (including critical care time)  Medications Ordered in ED Medications  cephALEXin (KEFLEX) capsule 500 mg (not administered)     Initial Impression / Assessment and Plan / ED Course  I have reviewed the triage vital signs and the nursing notes.  Pertinent labs & imaging results that were available during my care of the patient were reviewed by me and considered in my medical decision making (see chart for details).    66 year old female with some lateral left ankle swelling, mild erythema.  I do not think this represents DVT.  X-rays obtained show normal bones without any evidence of osteomyelitis.  This could represent some early cellulitis or just other inflammation.  She is being placed on Keflex.  I have advised her to elevate and immobilize this area.  We discussed return precautions such as increasing redness, fever, or worsening pain.  She voices understanding. Final Clinical Impressions(s) / ED Diagnoses   Final diagnoses:  Swelling of lower leg    ED Discharge Orders        Ordered    cephALEXin (KEFLEX) 500 MG capsule  4 times daily     04/14/17 1242       Margarita Grizzleay, Archimedes Harold, MD 04/14/17 1244

## 2017-04-14 NOTE — ED Notes (Signed)
Pt returned from Xray and Phlebotomy at the bedside

## 2017-04-14 NOTE — ED Triage Notes (Signed)
Per PTAR, Pt is coming from home with complaints of bilateral foot swelling with some erythema. Pt has hx of partial leg amputation of the left foot. Woke up this morning with the swelling and reports some pain in the right foot. Pt was to follow-up with Orthopedics on Monday, but he was unable to see her today. Potential amputation of the right foot being evaluated. Hx of orthopedic issues in the past. Some nausea and vomiting noted this morning once after she drank milk. PTAR reports ETOH on board. Vitals per EMS: 100 palpated BP, 118 HR, 18 RR, 98%

## 2017-04-14 NOTE — Telephone Encounter (Signed)
FYI..  Patient states that she has been seeing Erin for right foot pain. Yesterday evening she was unable to sleep and states that now both of her feet are extremely swollen and painful. She is now vomiting and cannot keep anything down. She already has a follow up appt with Dr. Lajoyce Cornersuda scheduled for Monday. She wanted to know what she should do.  I advised patient since she is having such increased swelling in both feet and vomiting, she should probably go to the ED to be evaluated. I advised I would send Dr. Lajoyce Cornersuda a message in her chart advising him of our discussion for her appt on Monday.

## 2017-04-17 ENCOUNTER — Encounter (INDEPENDENT_AMBULATORY_CARE_PROVIDER_SITE_OTHER): Payer: Self-pay | Admitting: Orthopedic Surgery

## 2017-04-17 ENCOUNTER — Ambulatory Visit (INDEPENDENT_AMBULATORY_CARE_PROVIDER_SITE_OTHER): Payer: Medicare HMO | Admitting: Orthopedic Surgery

## 2017-04-17 DIAGNOSIS — M10072 Idiopathic gout, left ankle and foot: Secondary | ICD-10-CM | POA: Diagnosis not present

## 2017-04-17 DIAGNOSIS — M6701 Short Achilles tendon (acquired), right ankle: Secondary | ICD-10-CM

## 2017-04-17 MED ORDER — LIDOCAINE HCL 1 % IJ SOLN
2.0000 mL | INTRAMUSCULAR | Status: AC | PRN
  Administered 2017-04-17: 2 mL

## 2017-04-17 MED ORDER — METHYLPREDNISOLONE ACETATE 40 MG/ML IJ SUSP
40.0000 mg | INTRAMUSCULAR | Status: AC | PRN
  Administered 2017-04-17: 40 mg via INTRA_ARTICULAR

## 2017-04-17 NOTE — Progress Notes (Signed)
Office Visit Note   Patient: Kathryn [name]           Date of Birth: 1951/04/05           MRN: [id number] Visit Date: 04/17/2017              Requested by: Clovis RileyMitchell, L.August Saucerean, MD [address] [place], [place] [address] PCP: Clovis RileyMitchell, L.August Saucerean, MD  Chief Complaint  Patient presents with  . Left Foot - Pain      HPI: Patient is a 66 year old woman status post left transmetatarsal amputation who is been having increasing forefoot pain on the right as well as acute ankle pain and swelling on the left which she described was red.  Patient states that she went to the emergency room on 04/14/2017 and was referred to the office in follow-up.  Assessment & Plan: Visit Diagnoses:  1. Achilles tendon contracture, right   2. Acute idiopathic gout of left ankle     Plan: The left ankle was injected most likely an acute flareup of her gout.  We will follow-up as needed.  Patient was given instructions for Achilles stretching on the right recommended a stiff soled walking shoe to unload pressure on the forefoot feel this should relieve her symptoms and decrease her need for gabapentin.  Examination patient has a slight antalgic gait she does have some swelling around the left ankle which is tender to palpation  Follow-Up Instructions: Return if symptoms worsen or fail to improve.   Ortho Exam  Patient is alert, oriented, no adenopathy, well-dressed, normal affect, normal respiratory effort. There is no ulcers no cellulitis no signs of infection.  The foot is plantigrade with no ulcers on the left transmetatarsal amputation.  Examination the right foot she does have heel cord tightness with dorsiflexion about 20 degrees short of neutral with her knee extended.  She has tenderness to palpation beneath the metatarsal heads but no open ulcers.  Imaging: No results found. No images are attached to the encounter.  Labs: Lab Results  Component Value Date   HGBA1C 4.9 07/26/2011   ESRSEDRATE 24 (H) 06/12/2008   REPTSTATUS 02/23/2016 FINAL 02/18/2016   GRAMSTAIN NO WBC SEEN NO ORGANISMS SEEN 01/03/2012   CULT NO GROWTH 5 DAYS 02/18/2016    @LABSALLVALUES (HGBA1)@  There is no height or weight on file to calculate BMI.  Orders:  No orders of the defined types were placed in this encounter.  No orders of the defined types were placed in this encounter.    Procedures: Medium Joint Inj: L ankle on 04/17/2017 11:22 AM Indications: pain and diagnostic evaluation Details: 22 G 1.5 in needle, anteromedial approach Medications: 2 mL lidocaine 1 %; 40 mg methylPREDNISolone acetate 40 MG/ML Outcome: tolerated well, no immediate complications Procedure, treatment alternatives, risks and benefits explained, specific risks discussed. Consent was given by the patient. Immediately prior to procedure a time out was called to verify the correct patient, procedure, equipment, support staff and site/side marked as required. Patient was prepped and draped in the usual sterile fashion.      Clinical Data: No additional findings.  ROS:  All other systems negative, except as noted in the HPI. Review of Systems  Objective: Vital Signs: There were no vitals taken for this visit.  Specialty Comments:  No specialty comments available.  PMFS History: Patient Active Problem List   Diagnosis Date Noted  . Intractable pain 09/01/2016  . Nausea vomiting and diarrhea 09/01/2016  . Fall at home   .  Right hip pain   . Hepatic encephalopathy (HCC) 02/18/2016  . Visual hallucinations 02/18/2016  . Hypotension 02/18/2016  . Acute renal failure (ARF) (HCC) 02/18/2016  . Chronic pain 02/18/2016  . Abdominal pain 03/26/2015  . Acute on chronic pancreatitis (HCC) 03/26/2015  . Hypokalemia 03/26/2015  . Palpitation 03/26/2015  . Hypomagnesemia 03/26/2015  . Insomnia 03/17/2012  . Diarrhea 02/23/2012  . Hypoalbuminemia 02/21/2012  . Severe protein-calorie malnutrition (HCC)  10/21/2011  . Ascites 09/22/2011  . Alcoholic liver disease (HCC) 09/22/2011  . EtOH dependence (HCC) 07/27/2011  . Essential hypertension 09/16/2009  . SCOLIOSIS 08/17/2009  . UNSPECIFIED MONONEURITIS OF UPPER LIMB 06/12/2008  . HYPERLIPIDEMIA 02/07/2007  . GERD 02/07/2007  . OSTEOARTHRITIS 02/07/2007  . Osteoporosis 02/07/2007   Past Medical History:  Diagnosis Date  . Alcoholic liver disease (HCC)   . Arthritis    "all over"  . Ascites 09/22/11   "first time for , now resolved  . Dysrhythmia    "irregular"  . Fracture, humerus 07/2011   right  . GERD (gastroesophageal reflux disease)   . High cholesterol   . History of blood transfusion 07/2011   "when I had elbow surgery"  . Hypertension    not current  . Myocardial infarction (HCC)    NSTEMI 12/2011 in setting of septic shock  . Osteomyelitis (HCC)    left foot  . Osteoporosis   . Pancreatitis several years ago  . Peripheral vascular disease (HCC)   . Staph infection    left foot    Family History  Problem Relation Age of Onset  . Cancer Father        stomach, prostate, skin    Past Surgical History:  Procedure Laterality Date  . AMPUTATION     two toes on left foot  . ARTHROSCOPIC REPAIR ACL     right knee  . BUNIONECTOMY     both feet  . ESOPHAGOGASTRODUODENOSCOPY  09/23/2011   Procedure: ESOPHAGOGASTRODUODENOSCOPY (EGD);  Surgeon: Shirley Friar, MD;  Location: [facility] ENDOSCOPY;  Service: Endoscopy;  Laterality: N/A;  . ESOPHAGOGASTRODUODENOSCOPY (EGD) WITH PROPOFOL N/A 08/22/2012   Procedure: ESOPHAGOGASTRODUODENOSCOPY (EGD) WITH PROPOFOL;  Surgeon: Willis Modena, MD;  Location: [facility] ENDOSCOPY;  Service: Endoscopy;  Laterality: N/A;  . EUS N/A 03/25/2015   Procedure: UPPER ENDOSCOPIC ULTRASOUND (EUS) RADIAL;  Surgeon: Willis Modena, MD;  Location: [facility] ENDOSCOPY;  Service: Endoscopy;  Laterality: N/A;  . FINE NEEDLE ASPIRATION N/A 03/25/2015   Procedure: FINE NEEDLE ASPIRATION (FNA) RADIAL;  Surgeon: Willis Modena, MD;  Location: [facility] ENDOSCOPY;  Service: Endoscopy;  Laterality: N/A;  . FRACTURE SURGERY  2013   Right elbow  . JOINT REPLACEMENT     right hip   . LEG SURGERY Left    Correction of unequal leg length  . ORIF HUMERUS FRACTURE  07/26/2011   Procedure: OPEN REDUCTION INTERNAL FIXATION (ORIF) DISTAL HUMERUS FRACTURE;  Surgeon: Budd Palmer, MD;  Location: [facility] OR;  Service: Orthopedics;  Laterality: Right;  . ORIF WRIST FRACTURE Left 12/26/2014   Procedure: OPEN REDUCTION INTERNAL FIXATION (ORIF) WRIST FRACTURE;  Surgeon: Cammy Copa, MD;  Location: [facility] OR;  Service: Orthopedics;  Laterality: Left;  . PARACENTESIS  09/22/11   2.2L  . TOTAL HIP ARTHROPLASTY  ~ 2003   right  . TRANSMETATARSAL AMPUTATION  02/16/2012   Procedure: TRANSMETATARSAL AMPUTATION;  Surgeon: Nadara Mustard, MD;  [facility];  Service: Orthopedics;  Laterality: Left;  . TUBAL LIGATION  1980   Social History  Occupational History  . Not on file  Tobacco Use  . Smoking status: Never Smoker  . Smokeless tobacco: Never Used  Substance and Sexual Activity  . Alcohol use: No    Alcohol/week: 0.0 oz    Comment: "last drink was day before OR in 07/2011"  . Drug use: No  . Sexual activity: Not Currently

## 2017-04-27 ENCOUNTER — Telehealth (INDEPENDENT_AMBULATORY_CARE_PROVIDER_SITE_OTHER): Payer: Self-pay | Admitting: Orthopedic Surgery

## 2017-04-27 NOTE — Telephone Encounter (Signed)
Patient called and asks if Dr. Lajoyce Cornersuda would order for home therapy to come to her house, he told her to do an exercise program but she does not have transportation to a gym. Also, patient needs her Gabapentin sent to [facility] instead of [facility] so she does not have to pay out of pocket. She said [employer] sent over a fax for approval. Please advise # [phone]

## 2017-04-28 ENCOUNTER — Other Ambulatory Visit (INDEPENDENT_AMBULATORY_CARE_PROVIDER_SITE_OTHER): Payer: Self-pay | Admitting: Family

## 2017-04-28 MED ORDER — GABAPENTIN 100 MG PO CAPS: 100.0000 mg | ORAL_CAPSULE | Freq: Three times a day (TID) | ORAL | 1 refills | Status: AC

## 2017-04-28 NOTE — Telephone Encounter (Signed)
What should I do? Just mail her some exercises?

## 2017-05-01 NOTE — Telephone Encounter (Signed)
Mailed exercises to patients home address.

## 2017-05-01 NOTE — Telephone Encounter (Signed)
I called and left voicemail heel cord stretching program mailed to patients home. And advised Erin sent in g[facility]entin to [facility] so she would not have to pay out of pocket.

## 2017-08-09 ENCOUNTER — Other Ambulatory Visit: Payer: Self-pay | Admitting: Gastroenterology

## 2017-08-09 DIAGNOSIS — K7460 Unspecified cirrhosis of liver: Secondary | ICD-10-CM

## 2017-08-23 ENCOUNTER — Other Ambulatory Visit: Payer: Self-pay

## 2017-08-29 ENCOUNTER — Ambulatory Visit
Admission: RE | Admit: 2017-08-29 | Discharge: 2017-08-29 | Disposition: A | Discharge status: Home / Self Care | Payer: Self-pay | Source: Ambulatory Visit | Attending: Gastroenterology | Admitting: Gastroenterology

## 2017-08-29 DIAGNOSIS — K7460 Unspecified cirrhosis of liver: Secondary | ICD-10-CM

## 2017-08-29 DIAGNOSIS — K824 Cholesterolosis of gallbladder: Secondary | ICD-10-CM | POA: Diagnosis not present

## 2017-08-29 DIAGNOSIS — K8020 Calculus of gallbladder without cholecystitis without obstruction: Secondary | ICD-10-CM | POA: Diagnosis not present

## 2017-09-04 DIAGNOSIS — K7460 Unspecified cirrhosis of liver: Secondary | ICD-10-CM | POA: Diagnosis not present

## 2017-09-04 DIAGNOSIS — Z1211 Encounter for screening for malignant neoplasm of colon: Secondary | ICD-10-CM | POA: Diagnosis not present

## 2017-10-02 ENCOUNTER — Telehealth (INDEPENDENT_AMBULATORY_CARE_PROVIDER_SITE_OTHER): Payer: Self-pay | Admitting: Orthopedic Surgery

## 2017-10-02 NOTE — Telephone Encounter (Signed)
[facility] said [facility] would be [facility]ing over refill request for allopurinol, she has enough to last her until Sunday so she just wanted to make yall aware so she could have it before she runs out. # [phone]

## 2017-10-03 NOTE — Telephone Encounter (Signed)
I called pt and advised that we have not checked her uric acid level since 2015 it was a 10.6 at that time but that she needs to have her uric acid and liver tests checked. She states that she has a PCP that checks blood work routinely and that they have checked this for her recently. I asked that she send the lab work to our office for review first or she may want to ask PCP if he will manage this for her. Pt states tht she will call and have labs faxed here.

## 2017-10-03 NOTE — Telephone Encounter (Signed)
Patient called to update that PCP is faxing over labs. She hopes to get this refilled as soon as possible.

## 2017-10-06 ENCOUNTER — Ambulatory Visit (INDEPENDENT_AMBULATORY_CARE_PROVIDER_SITE_OTHER): Payer: Medicare HMO | Admitting: Orthopedic Surgery

## 2017-10-11 ENCOUNTER — Ambulatory Visit (INDEPENDENT_AMBULATORY_CARE_PROVIDER_SITE_OTHER): Payer: Medicare HMO | Admitting: Family

## 2017-10-25 ENCOUNTER — Ambulatory Visit (INDEPENDENT_AMBULATORY_CARE_PROVIDER_SITE_OTHER): Payer: Medicare HMO | Admitting: Family

## 2018-01-24 DIAGNOSIS — Z1231 Encounter for screening mammogram for malignant neoplasm of breast: Secondary | ICD-10-CM | POA: Diagnosis not present

## 2018-02-21 ENCOUNTER — Other Ambulatory Visit: Payer: Self-pay | Admitting: Gastroenterology

## 2018-02-21 DIAGNOSIS — K7469 Other cirrhosis of liver: Secondary | ICD-10-CM

## 2018-02-23 DIAGNOSIS — K7460 Unspecified cirrhosis of liver: Secondary | ICD-10-CM | POA: Diagnosis not present

## 2018-02-23 DIAGNOSIS — M109 Gout, unspecified: Secondary | ICD-10-CM | POA: Diagnosis not present

## 2018-02-23 DIAGNOSIS — M419 Scoliosis, unspecified: Secondary | ICD-10-CM | POA: Diagnosis not present

## 2018-02-23 DIAGNOSIS — M810 Age-related osteoporosis without current pathological fracture: Secondary | ICD-10-CM | POA: Diagnosis not present

## 2018-02-23 DIAGNOSIS — Z0000 Encounter for general adult medical examination without abnormal findings: Secondary | ICD-10-CM | POA: Diagnosis not present

## 2018-02-23 DIAGNOSIS — G2581 Restless legs syndrome: Secondary | ICD-10-CM | POA: Diagnosis not present

## 2018-02-23 DIAGNOSIS — Z23 Encounter for immunization: Secondary | ICD-10-CM | POA: Diagnosis not present

## 2018-02-28 ENCOUNTER — Other Ambulatory Visit: Payer: Self-pay

## 2018-03-06 ENCOUNTER — Other Ambulatory Visit: Payer: Self-pay

## 2018-03-13 ENCOUNTER — Other Ambulatory Visit: Payer: Self-pay

## 2018-07-28 ENCOUNTER — Emergency Department (HOSPITAL_COMMUNITY): Payer: Medicare HMO

## 2018-07-28 ENCOUNTER — Other Ambulatory Visit: Payer: Self-pay

## 2018-07-28 ENCOUNTER — Inpatient Hospital Stay (HOSPITAL_COMMUNITY)
Admission: EM | Admit: 2018-07-28 | Discharge: 2018-08-17 | DRG: 871 | Disposition: E | Payer: Medicare HMO | Attending: Internal Medicine | Admitting: Internal Medicine

## 2018-07-28 ENCOUNTER — Encounter (HOSPITAL_COMMUNITY): Payer: Self-pay | Admitting: *Deleted

## 2018-07-28 ENCOUNTER — Inpatient Hospital Stay (HOSPITAL_COMMUNITY): Payer: Medicare HMO

## 2018-07-28 DIAGNOSIS — K7290 Hepatic failure, unspecified without coma: Secondary | ICD-10-CM

## 2018-07-28 DIAGNOSIS — E872 Acidosis, unspecified: Secondary | ICD-10-CM

## 2018-07-28 DIAGNOSIS — E871 Hypo-osmolality and hyponatremia: Secondary | ICD-10-CM | POA: Diagnosis not present

## 2018-07-28 DIAGNOSIS — M810 Age-related osteoporosis without current pathological fracture: Secondary | ICD-10-CM | POA: Diagnosis present

## 2018-07-28 DIAGNOSIS — M869 Osteomyelitis, unspecified: Secondary | ICD-10-CM | POA: Diagnosis present

## 2018-07-28 DIAGNOSIS — Z79891 Long term (current) use of opiate analgesic: Secondary | ICD-10-CM

## 2018-07-28 DIAGNOSIS — I252 Old myocardial infarction: Secondary | ICD-10-CM

## 2018-07-28 DIAGNOSIS — A419 Sepsis, unspecified organism: Secondary | ICD-10-CM | POA: Diagnosis not present

## 2018-07-28 DIAGNOSIS — I739 Peripheral vascular disease, unspecified: Secondary | ICD-10-CM | POA: Diagnosis present

## 2018-07-28 DIAGNOSIS — R4182 Altered mental status, unspecified: Secondary | ICD-10-CM | POA: Diagnosis not present

## 2018-07-28 DIAGNOSIS — J9600 Acute respiratory failure, unspecified whether with hypoxia or hypercapnia: Secondary | ICD-10-CM

## 2018-07-28 DIAGNOSIS — E860 Dehydration: Secondary | ICD-10-CM | POA: Diagnosis not present

## 2018-07-28 DIAGNOSIS — I10 Essential (primary) hypertension: Secondary | ICD-10-CM | POA: Diagnosis present

## 2018-07-28 DIAGNOSIS — R570 Cardiogenic shock: Secondary | ICD-10-CM | POA: Diagnosis present

## 2018-07-28 DIAGNOSIS — R8569 Abnormal cytological findings in specimens from other digestive organs and abdominal cavity: Secondary | ICD-10-CM | POA: Diagnosis not present

## 2018-07-28 DIAGNOSIS — Z79899 Other long term (current) drug therapy: Secondary | ICD-10-CM

## 2018-07-28 DIAGNOSIS — Z809 Family history of malignant neoplasm, unspecified: Secondary | ICD-10-CM

## 2018-07-28 DIAGNOSIS — E876 Hypokalemia: Secondary | ICD-10-CM | POA: Diagnosis present

## 2018-07-28 DIAGNOSIS — N179 Acute kidney failure, unspecified: Secondary | ICD-10-CM | POA: Diagnosis not present

## 2018-07-28 DIAGNOSIS — D649 Anemia, unspecified: Secondary | ICD-10-CM | POA: Diagnosis present

## 2018-07-28 DIAGNOSIS — K7031 Alcoholic cirrhosis of liver with ascites: Secondary | ICD-10-CM | POA: Diagnosis present

## 2018-07-28 DIAGNOSIS — J90 Pleural effusion, not elsewhere classified: Secondary | ICD-10-CM | POA: Diagnosis not present

## 2018-07-28 DIAGNOSIS — K219 Gastro-esophageal reflux disease without esophagitis: Secondary | ICD-10-CM | POA: Diagnosis present

## 2018-07-28 DIAGNOSIS — R6521 Severe sepsis with septic shock: Secondary | ICD-10-CM | POA: Diagnosis present

## 2018-07-28 DIAGNOSIS — Z4659 Encounter for fitting and adjustment of other gastrointestinal appliance and device: Secondary | ICD-10-CM

## 2018-07-28 DIAGNOSIS — R4020 Unspecified coma: Secondary | ICD-10-CM | POA: Diagnosis not present

## 2018-07-28 DIAGNOSIS — Z515 Encounter for palliative care: Secondary | ICD-10-CM | POA: Diagnosis present

## 2018-07-28 DIAGNOSIS — R40243 Glasgow coma scale score 3-8, unspecified time: Secondary | ICD-10-CM | POA: Diagnosis not present

## 2018-07-28 DIAGNOSIS — R579 Shock, unspecified: Secondary | ICD-10-CM | POA: Diagnosis not present

## 2018-07-28 DIAGNOSIS — K7682 Hepatic encephalopathy: Secondary | ICD-10-CM

## 2018-07-28 DIAGNOSIS — S0291XA Unspecified fracture of skull, initial encounter for closed fracture: Secondary | ICD-10-CM | POA: Diagnosis not present

## 2018-07-28 DIAGNOSIS — R188 Other ascites: Secondary | ICD-10-CM | POA: Diagnosis not present

## 2018-07-28 DIAGNOSIS — R0602 Shortness of breath: Secondary | ICD-10-CM

## 2018-07-28 DIAGNOSIS — M4712 Other spondylosis with myelopathy, cervical region: Secondary | ICD-10-CM | POA: Diagnosis not present

## 2018-07-28 DIAGNOSIS — I639 Cerebral infarction, unspecified: Secondary | ICD-10-CM | POA: Diagnosis present

## 2018-07-28 DIAGNOSIS — J9811 Atelectasis: Secondary | ICD-10-CM | POA: Diagnosis not present

## 2018-07-28 DIAGNOSIS — R0902 Hypoxemia: Secondary | ICD-10-CM | POA: Diagnosis not present

## 2018-07-28 DIAGNOSIS — R0689 Other abnormalities of breathing: Secondary | ICD-10-CM | POA: Diagnosis not present

## 2018-07-28 DIAGNOSIS — R404 Transient alteration of awareness: Secondary | ICD-10-CM | POA: Diagnosis not present

## 2018-07-28 LAB — CBC WITH DIFFERENTIAL/PLATELET
Abs Immature Granulocytes: 0.13 10*3/uL — ABNORMAL HIGH (ref 0.00–0.07)
Abs Immature Granulocytes: 0.12 10*3/uL — ABNORMAL HIGH (ref 0.00–0.07)
Basophils Absolute: 0 10*3/uL (ref 0.0–0.1)
Basophils Absolute: 0 10*3/uL (ref 0.0–0.1)
Basophils Relative: 0 %
Basophils Relative: 0 %
Eosinophils Absolute: 0.1 10*3/uL (ref 0.0–0.5)
Eosinophils Absolute: 0 10*3/uL (ref 0.0–0.5)
Eosinophils Relative: 1 %
Eosinophils Relative: 0 %
HCT: 26.1 % — ABNORMAL LOW (ref 36.0–46.0)
HCT: 23.7 % — ABNORMAL LOW (ref 36.0–46.0)
Hemoglobin: 8.6 g/dL — ABNORMAL LOW (ref 12.0–15.0)
Hemoglobin: 7.8 g/dL — ABNORMAL LOW (ref 12.0–15.0)
Immature Granulocytes: 1 %
Immature Granulocytes: 1 %
Lymphocytes Relative: 18 %
Lymphocytes Relative: 7 %
Lymphs Abs: 2.9 10*3/uL (ref 0.7–4.0)
Lymphs Abs: 0.9 10*3/uL (ref 0.7–4.0)
MCH: 36.3 pg — ABNORMAL HIGH (ref 26.0–34.0)
MCH: 35.8 pg — ABNORMAL HIGH (ref 26.0–34.0)
MCHC: 33 g/dL (ref 30.0–36.0)
MCHC: 32.9 g/dL (ref 30.0–36.0)
MCV: 110.1 fL — ABNORMAL HIGH (ref 80.0–100.0)
MCV: 108.7 fL — ABNORMAL HIGH (ref 80.0–100.0)
Monocytes Absolute: 1.5 10*3/uL — ABNORMAL HIGH (ref 0.1–1.0)
Monocytes Absolute: 0.8 10*3/uL (ref 0.1–1.0)
Monocytes Relative: 10 %
Monocytes Relative: 6 %
Neutro Abs: 11.2 10*3/uL — ABNORMAL HIGH (ref 1.7–7.7)
Neutro Abs: 10.3 10*3/uL — ABNORMAL HIGH (ref 1.7–7.7)
Neutrophils Relative %: 70 %
Neutrophils Relative %: 86 %
Platelets: 153 10*3/uL (ref 150–400)
Platelets: 117 10*3/uL — ABNORMAL LOW (ref 150–400)
RBC: 2.37 MIL/uL — ABNORMAL LOW (ref 3.87–5.11)
RBC: 2.18 MIL/uL — ABNORMAL LOW (ref 3.87–5.11)
RDW: 19.5 % — ABNORMAL HIGH (ref 11.5–15.5)
RDW: 19.2 % — ABNORMAL HIGH (ref 11.5–15.5)
WBC: 15.9 10*3/uL — ABNORMAL HIGH (ref 4.0–10.5)
WBC: 12.1 10*3/uL — ABNORMAL HIGH (ref 4.0–10.5)
nRBC: 0 % (ref 0.0–0.2)
nRBC: 0 % (ref 0.0–0.2)

## 2018-07-28 LAB — LACTIC ACID, PLASMA
Lactic Acid, Venous: >11 mmol/L (ref 0.5–1.9)
Lactic Acid, Venous: 5.5 mmol/L (ref 0.5–1.9)
Lactic Acid, Venous: 6.1 mmol/L (ref 0.5–1.9)
Lactic Acid, Venous: 4.3 mmol/L (ref 0.5–1.9)
Lactic Acid, Venous: 4.1 mmol/L (ref 0.5–1.9)

## 2018-07-28 LAB — COMPREHENSIVE METABOLIC PANEL
ALT: 22 U/L (ref 0–44)
ALT: 19 U/L (ref 0–44)
AST: 91 U/L — ABNORMAL HIGH (ref 15–41)
AST: 58 U/L — ABNORMAL HIGH (ref 15–41)
Albumin: 2 g/dL — ABNORMAL LOW (ref 3.5–5.0)
Albumin: 1.7 g/dL — ABNORMAL LOW (ref 3.5–5.0)
Alkaline Phosphatase: 148 U/L — ABNORMAL HIGH (ref 38–126)
Alkaline Phosphatase: 123 U/L (ref 38–126)
Anion gap: 24 — ABNORMAL HIGH (ref 5–15)
Anion gap: 19 — ABNORMAL HIGH (ref 5–15)
BUN: 36 mg/dL — ABNORMAL HIGH (ref 8–23)
BUN: 32 mg/dL — ABNORMAL HIGH (ref 8–23)
CO2: 20 mmol/L — ABNORMAL LOW (ref 22–32)
CO2: 22 mmol/L (ref 22–32)
Calcium: 8.3 mg/dL — ABNORMAL LOW (ref 8.9–10.3)
Calcium: 7.4 mg/dL — ABNORMAL LOW (ref 8.9–10.3)
Chloride: 81 mmol/L — ABNORMAL LOW (ref 98–111)
Chloride: 87 mmol/L — ABNORMAL LOW (ref 98–111)
Creatinine, Ser: 5.21 mg/dL — ABNORMAL HIGH (ref 0.44–1.00)
Creatinine, Ser: 4.47 mg/dL — ABNORMAL HIGH (ref 0.44–1.00)
GFR calc Af Amer: 9 mL/min — ABNORMAL LOW (ref >60)
GFR calc Af Amer: 11 mL/min — ABNORMAL LOW (ref >60)
GFR calc non Af Amer: 8 mL/min — ABNORMAL LOW (ref >60)
GFR calc non Af Amer: 10 mL/min — ABNORMAL LOW (ref >60)
Glucose, Bld: 130 mg/dL — ABNORMAL HIGH (ref 70–99)
Glucose, Bld: 125 mg/dL — ABNORMAL HIGH (ref 70–99)
Potassium: 4 mmol/L (ref 3.5–5.1)
Potassium: 2.9 mmol/L — ABNORMAL LOW (ref 3.5–5.1)
Sodium: 125 mmol/L — ABNORMAL LOW (ref 135–145)
Sodium: 128 mmol/L — ABNORMAL LOW (ref 135–145)
Total Bilirubin: 7 mg/dL — ABNORMAL HIGH (ref 0.3–1.2)
Total Bilirubin: 6.3 mg/dL — ABNORMAL HIGH (ref 0.3–1.2)
Total Protein: 6.1 g/dL — ABNORMAL LOW (ref 6.5–8.1)
Total Protein: 5.3 g/dL — ABNORMAL LOW (ref 6.5–8.1)

## 2018-07-28 LAB — POCT I-STAT EG7
Acid-base deficit: 1 mmol/L (ref 0.0–2.0)
Bicarbonate: 22.6 mmol/L (ref 20.0–28.0)
Calcium, Ion: 0.89 mmol/L — CL (ref 1.15–1.40)
HCT: 29 % — ABNORMAL LOW (ref 36.0–46.0)
Hemoglobin: 9.9 g/dL — ABNORMAL LOW (ref 12.0–15.0)
O2 Saturation: 89 %
Potassium: 3.9 mmol/L (ref 3.5–5.1)
Sodium: 125 mmol/L — ABNORMAL LOW (ref 135–145)
TCO2: 24 mmol/L (ref 22–32)
pCO2, Ven: 33.9 mmHg — ABNORMAL LOW (ref 44.0–60.0)
pH, Ven: 7.432 — ABNORMAL HIGH (ref 7.250–7.430)
pO2, Ven: 54 mmHg — ABNORMAL HIGH (ref 32.0–45.0)

## 2018-07-28 LAB — BODY FLUID CELL COUNT WITH DIFFERENTIAL
Eos, Fluid: 0 %
Lymphs, Fluid: 5 %
Monocyte-Macrophage-Serous Fluid: 76 % (ref 50–90)
Neutrophil Count, Fluid: 19 % (ref 0–25)
Total Nucleated Cell Count, Fluid: 335 cu mm (ref 0–1000)

## 2018-07-28 LAB — URINALYSIS, COMPLETE (UACMP) WITH MICROSCOPIC
Glucose, UA: NEGATIVE mg/dL
Hgb urine dipstick: NEGATIVE
Ketones, ur: NEGATIVE mg/dL
Leukocytes,Ua: NEGATIVE
Nitrite: NEGATIVE
Protein, ur: 30 mg/dL — AB
Specific Gravity, Urine: 1.017 (ref 1.005–1.030)
pH: 5 (ref 5.0–8.0)

## 2018-07-28 LAB — GRAM STAIN

## 2018-07-28 LAB — CBG MONITORING, ED: Glucose-Capillary: 116 mg/dL — ABNORMAL HIGH (ref 70–99)

## 2018-07-28 LAB — BLOOD GAS, ARTERIAL
Acid-base deficit: 0.2 mmol/L (ref 0.0–2.0)
Bicarbonate: 22.2 mmol/L (ref 20.0–28.0)
Drawn by: 350431
FIO2: 1
MECHVT: 400 mL
PEEP: 5 cmH2O
Patient temperature: 96.5
RATE: 16 resp/min
pCO2 arterial: 24.6 mmHg — ABNORMAL LOW (ref 32.0–48.0)
pH, Arterial: 7.558 — ABNORMAL HIGH (ref 7.350–7.450)
pO2, Arterial: 240 mmHg — ABNORMAL HIGH (ref 83.0–108.0)

## 2018-07-28 LAB — RAPID URINE DRUG SCREEN, HOSP PERFORMED
Amphetamines: NOT DETECTED
Barbiturates: NOT DETECTED
Benzodiazepines: NOT DETECTED
Cocaine: NOT DETECTED
Opiates: NOT DETECTED
Tetrahydrocannabinol: NOT DETECTED

## 2018-07-28 LAB — HIV ANTIBODY (ROUTINE TESTING W REFLEX): HIV Screen 4th Generation wRfx: NONREACTIVE

## 2018-07-28 LAB — BRAIN NATRIURETIC PEPTIDE: B Natriuretic Peptide: 107.1 pg/mL — ABNORMAL HIGH (ref 0.0–100.0)

## 2018-07-28 LAB — GLUCOSE, CAPILLARY
Glucose-Capillary: 136 mg/dL — ABNORMAL HIGH (ref 70–99)
Glucose-Capillary: 40 mg/dL — CL (ref 70–99)
Glucose-Capillary: 116 mg/dL — ABNORMAL HIGH (ref 70–99)
Glucose-Capillary: 90 mg/dL (ref 70–99)
Glucose-Capillary: 72 mg/dL (ref 70–99)

## 2018-07-28 LAB — ACETAMINOPHEN LEVEL: Acetaminophen (Tylenol), Serum: <10 ug/mL — ABNORMAL LOW (ref 10–30)

## 2018-07-28 LAB — TSH: TSH: 6.223 u[IU]/mL — ABNORMAL HIGH (ref 0.350–4.500)

## 2018-07-28 LAB — CORTISOL-AM, BLOOD: Cortisol - AM: 17.6 ug/dL (ref 6.7–22.6)

## 2018-07-28 LAB — ETHANOL: Alcohol, Ethyl (B): <10 mg/dL (ref <10)

## 2018-07-28 LAB — APTT: aPTT: 38 seconds — ABNORMAL HIGH (ref 24–36)

## 2018-07-28 LAB — PHOSPHORUS: Phosphorus: 2.5 mg/dL (ref 2.5–4.6)

## 2018-07-28 LAB — SALICYLATE LEVEL: Salicylate Lvl: <7 mg/dL (ref 2.8–30.0)

## 2018-07-28 LAB — PROTIME-INR
INR: 1.7 — ABNORMAL HIGH (ref 0.8–1.2)
INR: 1.9 — ABNORMAL HIGH (ref 0.8–1.2)
Prothrombin Time: 19.8 seconds — ABNORMAL HIGH (ref 11.4–15.2)
Prothrombin Time: 21.8 seconds — ABNORMAL HIGH (ref 11.4–15.2)

## 2018-07-28 LAB — HEMOGLOBIN A1C
Hgb A1c MFr Bld: 3.7 % — ABNORMAL LOW (ref 4.8–5.6)
Mean Plasma Glucose: 59.49 mg/dL

## 2018-07-28 LAB — TRIGLYCERIDES: Triglycerides: 57 mg/dL (ref <150)

## 2018-07-28 LAB — AMMONIA: Ammonia: 276 umol/L — ABNORMAL HIGH (ref 9–35)

## 2018-07-28 LAB — MRSA PCR SCREENING: MRSA by PCR: NEGATIVE

## 2018-07-28 LAB — PROCALCITONIN: Procalcitonin: 1.6 ng/mL

## 2018-07-28 LAB — MAGNESIUM: Magnesium: 1 mg/dL — ABNORMAL LOW (ref 1.7–2.4)

## 2018-07-28 MED ORDER — PANTOPRAZOLE SODIUM 40 MG PO PACK
40.0000 mg | PACK | Freq: Every day | ORAL | Status: DC
  Administered 2018-07-28 – 2018-07-29 (×2): 40 mg
  Filled 2018-07-28 (×2): qty 20

## 2018-07-28 MED ORDER — VANCOMYCIN VARIABLE DOSE PER UNSTABLE RENAL FUNCTION (PHARMACIST DOSING): Status: DC

## 2018-07-28 MED ORDER — INSULIN ASPART 100 UNIT/ML ~~LOC~~ SOLN
1.0000 [IU] | SUBCUTANEOUS | Status: DC
  Administered 2018-07-29 (×2): 2 [IU] via SUBCUTANEOUS

## 2018-07-28 MED ORDER — POTASSIUM CHLORIDE 10 MEQ/100ML IV SOLN
10.0000 meq | INTRAVENOUS | Status: AC
  Administered 2018-07-28 (×2): 10 meq via INTRAVENOUS
  Filled 2018-07-28 (×2): qty 100

## 2018-07-28 MED ORDER — FENTANYL CITRATE (PF) 100 MCG/2ML IJ SOLN
50.0000 ug | INTRAMUSCULAR | Status: DC | PRN
  Administered 2018-07-28 – 2018-07-29 (×2): 50 ug via INTRAVENOUS
  Filled 2018-07-28 (×2): qty 2

## 2018-07-28 MED ORDER — SODIUM CHLORIDE 0.9 % IV BOLUS
500.0000 mL | Freq: Once | INTRAVENOUS | Status: AC
  Administered 2018-07-28: 500 mL via INTRAVENOUS

## 2018-07-28 MED ORDER — SODIUM CHLORIDE 0.9 % IV BOLUS (SEPSIS)
500.0000 mL | Freq: Once | INTRAVENOUS | Status: AC
  Administered 2018-07-28: 500 mL via INTRAVENOUS

## 2018-07-28 MED ORDER — ENOXAPARIN SODIUM 30 MG/0.3ML ~~LOC~~ SOLN
30.0000 mg | Freq: Every day | SUBCUTANEOUS | Status: DC
  Administered 2018-07-28: 30 mg via SUBCUTANEOUS
  Filled 2018-07-28: qty 0.3

## 2018-07-28 MED ORDER — DOCUSATE SODIUM 50 MG/5ML PO LIQD: 100.0000 mg | Freq: Two times a day (BID) | ORAL | Status: DC | PRN

## 2018-07-28 MED ORDER — PHENYLEPHRINE HCL-NACL 10-0.9 MG/250ML-% IV SOLN
25.0000 ug/min | INTRAVENOUS | Status: DC
  Administered 2018-07-28: 200 ug/min via INTRAVENOUS
  Administered 2018-07-29: 160 ug/min via INTRAVENOUS
  Administered 2018-07-29 (×3): 200 ug/min via INTRAVENOUS
  Administered 2018-07-29: 150 ug/min via INTRAVENOUS
  Administered 2018-07-29: 160 ug/min via INTRAVENOUS
  Administered 2018-07-29: 150 ug/min via INTRAVENOUS
  Administered 2018-07-29: 160 ug/min via INTRAVENOUS
  Administered 2018-07-29: 150 ug/min via INTRAVENOUS
  Administered 2018-07-29: 200 ug/min via INTRAVENOUS
  Filled 2018-07-28 (×6): qty 250
  Filled 2018-07-28: qty 1000
  Filled 2018-07-28 (×5): qty 250

## 2018-07-28 MED ORDER — SODIUM CHLORIDE 0.9 % IV SOLN: 2.0000 g | Freq: Once | INTRAVENOUS | Status: DC

## 2018-07-28 MED ORDER — VANCOMYCIN HCL IN DEXTROSE 1-5 GM/200ML-% IV SOLN
1000.0000 mg | Freq: Once | INTRAVENOUS | Status: AC
  Administered 2018-07-28: 1000 mg via INTRAVENOUS
  Filled 2018-07-28: qty 200

## 2018-07-28 MED ORDER — DEXTROSE 5 % IV SOLN
INTRAVENOUS | Status: DC
  Administered 2018-07-28 – 2018-07-29 (×2): via INTRAVENOUS

## 2018-07-28 MED ORDER — JEVITY 1.2 CAL PO LIQD
1000.0000 mL | ORAL | Status: DC
  Filled 2018-07-28 (×2): qty 1000

## 2018-07-28 MED ORDER — PRO-STAT SUGAR FREE PO LIQD: 60.0000 mL | Freq: Two times a day (BID) | ORAL | Status: DC

## 2018-07-28 MED ORDER — PHENYLEPHRINE HCL-NACL 10-0.9 MG/250ML-% IV SOLN
INTRAVENOUS | Status: AC
  Filled 2018-07-28: qty 250

## 2018-07-28 MED ORDER — SODIUM CHLORIDE 0.9 % IV BOLUS (SEPSIS)
250.0000 mL | Freq: Once | INTRAVENOUS | Status: AC
  Administered 2018-07-28: 250 mL via INTRAVENOUS

## 2018-07-28 MED ORDER — ALBUMIN HUMAN 25 % IV SOLN
12.5000 g | Freq: Four times a day (QID) | INTRAVENOUS | Status: DC
  Administered 2018-07-28 – 2018-07-29 (×3): 12.5 g via INTRAVENOUS
  Filled 2018-07-28 (×3): qty 50

## 2018-07-28 MED ORDER — SODIUM CHLORIDE 0.9 % IV SOLN
2.0000 g | Freq: Once | INTRAVENOUS | Status: AC
  Administered 2018-07-28: 2 g via INTRAVENOUS
  Filled 2018-07-28: qty 2

## 2018-07-28 MED ORDER — LACTULOSE 10 GM/15ML PO SOLN
30.0000 g | ORAL | Status: AC
  Administered 2018-07-28 (×3): 30 g
  Filled 2018-07-28 (×3): qty 45

## 2018-07-28 MED ORDER — PHENYLEPHRINE HCL-NACL 10-0.9 MG/250ML-% IV SOLN
25.0000 ug/min | INTRAVENOUS | Status: DC
  Administered 2018-07-28 (×2): 200 ug/min via INTRAVENOUS
  Administered 2018-07-28: 25 ug/min via INTRAVENOUS
  Administered 2018-07-28 (×4): 200 ug/min via INTRAVENOUS
  Filled 2018-07-28 (×2): qty 250
  Filled 2018-07-28: qty 500
  Filled 2018-07-28: qty 250

## 2018-07-28 MED ORDER — LACTATED RINGERS IV BOLUS
2000.0000 mL | Freq: Once | INTRAVENOUS | Status: AC
  Administered 2018-07-28: 2000 mL via INTRAVENOUS

## 2018-07-28 MED ORDER — METRONIDAZOLE IN NACL 5-0.79 MG/ML-% IV SOLN
500.0000 mg | Freq: Once | INTRAVENOUS | Status: AC
  Administered 2018-07-28: 500 mg via INTRAVENOUS
  Filled 2018-07-28: qty 100

## 2018-07-28 MED ORDER — SODIUM CHLORIDE 0.9 % IV SOLN
INTRAVENOUS | Status: DC
  Administered 2018-07-28 (×2): via INTRAVENOUS

## 2018-07-28 MED ORDER — LACTULOSE 10 GM/15ML PO SOLN
30.0000 g | Freq: Three times a day (TID) | ORAL | Status: DC
  Administered 2018-07-28 – 2018-07-29 (×4): 30 g
  Filled 2018-07-28 (×4): qty 45

## 2018-07-28 MED ORDER — DEXTROSE 50 % IV SOLN
INTRAVENOUS | Status: AC
  Administered 2018-07-28: 50 mL via INTRAVENOUS
  Filled 2018-07-28: qty 50

## 2018-07-28 MED ORDER — DEXTROSE 50 % IV SOLN
50.0000 mL | Freq: Once | INTRAVENOUS | Status: AC
  Administered 2018-07-28: 50 mL via INTRAVENOUS

## 2018-07-28 MED ORDER — FENTANYL CITRATE (PF) 100 MCG/2ML IJ SOLN
50.0000 ug | INTRAMUSCULAR | Status: AC | PRN
  Administered 2018-07-28 – 2018-07-29 (×3): 50 ug via INTRAVENOUS
  Filled 2018-07-28 (×3): qty 2

## 2018-07-28 MED ORDER — JEVITY 1.2 CAL PO LIQD
1000.0000 mL | ORAL | Status: DC
  Filled 2018-07-28: qty 1000

## 2018-07-28 MED ORDER — NOREPINEPHRINE 4 MG/250ML-% IV SOLN
0.0000 ug/min | INTRAVENOUS | Status: DC
  Administered 2018-07-28: 40 ug/min via INTRAVENOUS
  Administered 2018-07-28: 30 ug/min via INTRAVENOUS
  Administered 2018-07-28: 4 ug/min via INTRAVENOUS
  Administered 2018-07-28 (×3): 40 ug/min via INTRAVENOUS
  Administered 2018-07-28: 24 ug/min via INTRAVENOUS
  Administered 2018-07-28 – 2018-07-29 (×8): 40 ug/min via INTRAVENOUS
  Filled 2018-07-28 (×9): qty 250
  Filled 2018-07-28: qty 750
  Filled 2018-07-28: qty 250

## 2018-07-28 MED ORDER — MIDAZOLAM HCL 2 MG/2ML IJ SOLN
1.0000 mg | INTRAMUSCULAR | Status: AC | PRN
  Administered 2018-07-28 (×3): 1 mg via INTRAVENOUS
  Filled 2018-07-28 (×2): qty 2

## 2018-07-28 MED ORDER — POTASSIUM CHLORIDE 10 MEQ/50ML IV SOLN
10.0000 meq | INTRAVENOUS | Status: DC
  Filled 2018-07-28 (×2): qty 50

## 2018-07-28 MED ORDER — ETOMIDATE 2 MG/ML IV SOLN
INTRAVENOUS | Status: AC | PRN
  Administered 2018-07-28: 20 mg via INTRAVENOUS

## 2018-07-28 MED ORDER — ROCURONIUM BROMIDE 50 MG/5ML IV SOLN
INTRAVENOUS | Status: AC | PRN
  Administered 2018-07-28: 80 mg via INTRAVENOUS

## 2018-07-28 MED ORDER — SODIUM CHLORIDE 0.9 % IV SOLN: 250.0000 mL | INTRAVENOUS | Status: DC

## 2018-07-28 MED ORDER — METRONIDAZOLE IN NACL 5-0.79 MG/ML-% IV SOLN
500.0000 mg | Freq: Three times a day (TID) | INTRAVENOUS | Status: DC
  Administered 2018-07-28 – 2018-07-29 (×3): 500 mg via INTRAVENOUS
  Filled 2018-07-28 (×2): qty 100

## 2018-07-28 MED ORDER — SODIUM CHLORIDE 0.9 % IV BOLUS (SEPSIS)
1000.0000 mL | Freq: Once | INTRAVENOUS | Status: AC
  Administered 2018-07-28: 500 mL via INTRAVENOUS

## 2018-07-28 MED ORDER — VASOPRESSIN 20 UNIT/ML IV SOLN
0.0300 [IU]/min | INTRAVENOUS | Status: DC
  Administered 2018-07-28: 0.03 [IU]/min via INTRAVENOUS
  Filled 2018-07-28 (×2): qty 2

## 2018-07-28 MED ORDER — PRO-STAT SUGAR FREE PO LIQD
30.0000 mL | Freq: Two times a day (BID) | ORAL | Status: DC
  Administered 2018-07-28 – 2018-07-29 (×2): 30 mL
  Filled 2018-07-28 (×2): qty 30

## 2018-07-28 MED ORDER — CHLORHEXIDINE GLUCONATE 0.12% ORAL RINSE (MEDLINE KIT)
15.0000 mL | Freq: Two times a day (BID) | OROMUCOSAL | Status: DC
  Administered 2018-07-28 – 2018-07-29 (×3): 15 mL via OROMUCOSAL

## 2018-07-28 MED ORDER — INSULIN ASPART 100 UNIT/ML ~~LOC~~ SOLN
0.0000 [IU] | Freq: Three times a day (TID) | SUBCUTANEOUS | Status: DC
  Administered 2018-07-28: 2 [IU] via SUBCUTANEOUS

## 2018-07-28 MED ORDER — MIDAZOLAM HCL 2 MG/2ML IJ SOLN
1.0000 mg | INTRAMUSCULAR | Status: DC | PRN
  Administered 2018-07-29: 1 mg via INTRAVENOUS
  Filled 2018-07-28 (×2): qty 2

## 2018-07-28 MED ORDER — SODIUM CHLORIDE 0.9 % IV SOLN
1.0000 g | INTRAVENOUS | Status: DC
  Administered 2018-07-29: 1 g via INTRAVENOUS
  Filled 2018-07-28 (×2): qty 1

## 2018-07-28 MED ORDER — NOREPINEPHRINE 4 MG/250ML-% IV SOLN
INTRAVENOUS | Status: AC
  Filled 2018-07-28: qty 250

## 2018-07-28 MED ORDER — ORAL CARE MOUTH RINSE
15.0000 mL | OROMUCOSAL | Status: DC
  Administered 2018-07-28 – 2018-07-29 (×12): 15 mL via OROMUCOSAL

## 2018-07-28 MED ORDER — SODIUM CHLORIDE 0.9 % IV BOLUS (SEPSIS)
1000.0000 mL | Freq: Once | INTRAVENOUS | Status: AC
  Administered 2018-07-28: 1000 mL via INTRAVENOUS

## 2018-07-28 MED ORDER — PROPOFOL 1000 MG/100ML IV EMUL: 0.0000 ug/kg/min | INTRAVENOUS | Status: DC

## 2018-07-28 NOTE — ED Notes (Signed)
Unsuccessful attempt to give report  A nurse has not been assigned to the pt yet  thery will call back

## 2018-07-28 NOTE — Progress Notes (Signed)
CRITICAL VALUE ALERT  Critical Value: cbg 40/116 post dextrose  Date & Time Notied:  2018/08/06 @1235 

## 2018-07-28 NOTE — ED Triage Notes (Signed)
The pt arrived by [facility] from home lives  At home with her husband  She is imaciated being bagged on arrival and on  The pacemaker  She arrived on an epi drip  Both eyes gazing to the lt  No verfbal response  No response to pain

## 2018-07-28 NOTE — Progress Notes (Signed)
CRITICAL VALUE ALERT  Critical Value:  Lactic 4.2  Date & Time Notied:  2018/08/05 @ 1020  Provider Notified: Dr. Marchelle Gearing

## 2018-07-28 NOTE — H&P (Addendum)
NAME:  Kathryn [name], MRN:  [id number], DOB:  February 21, 1951, LOS: 0 ADMISSION DATE:  08/15/2018, CONSULTATION DATE:  07/25/2018 REFERRING MD:  ED, CHIEF COMPLAINT:  AMS   Brief History   AMS/sepsis  intubated for airway protection  History of present illness   68 year old female history of alcholic cirrhosis/hepatic encephalopathy presented to ED 08/12/2018 with altered mental status, agonal breathing.  She was started with pacing with paramedics and epi drip.  Hypertensive and hypothermic on presentation in ed.  Pacing removed, epi drip d/c.  Became hypotensive, continued AMS.  Lactate 11, wbc 15 with left shift.  Started on vanc, cefipime, flagyl in ed.  Started on levophed.  CT head subtle loss of gray-white diff, suspic for evolving acute ischemic left PCA territory.  Discussed with neurology in ed, likely artifact. Also decompensation per significant other over days; out of window for tpa given chronicity of symptoms.    rec MRI when metabolic derranagment normalize.  Intubated for airway protection.    Past Medical History  Alcoholic liver disease with previous admit for HE  Significant Hospital Events     Consults:    Procedures:  ETT 07/23/2018  Significant Diagnostic Tests:  Ammonia 276 Lactate 11 Wbc 15 Ct head 07/27/2018 read as possible evolving acute ischemic left PCA stroke; neurology thought artifact  Micro Data:  Blood cx pending U/a negative   Antimicrobials:  vanc 08/11/2018 cefipime 08/04/2018 Flagyl 08/01/2018   Interim history/subjective:    Objective   Blood pressure (!) 89/70, pulse 95, temperature (!) 95.3 F (35.2 C), resp. rate (!) 24, weight 50 kg, SpO2 98 %.        Intake/Output Summary (Last 24 hours) at 08/02/2018 0334 Last data filed at 07/18/2018 0247 Gross per 24 hour  Intake 200 ml  Output -  Net 200 ml   Filed Weights   08/06/2018 0200  Weight: 50 kg    Examination: General: intubated, unresponsive  HENT: PERRLA Lungs: cta b/l  Cardiovascular: RRR  no m/r/g Abdomen: soft nt nd bs+ Extremities: no le edema Neuro: no focal deficits GU: foley in place   Resolved Hospital Problem list     Assessment & Plan:  68 year old female history of alcoholic liver disease presents as AMS/sepsis/HE  AMS- likley combination sepsis / HE/ possible stroke.  Ct head done in ed read as possible evolving PCA stroke.  Neurology consulted.  Felt ct was likely artifact.  rec MRI when acute metabolic derrangement corrected.   - will get MRI when ammonia , lactate clear - will rx HE as per below - will rx sepsis per below   Sepsis- cxr prior to intubation showed RML atelectasis, no obvious infiltrate.  Started vanc, cef, flagyl for possible intraabdominal source. Ct abdomen showed large uterine fibroid, mod ascites.  Paracentesis done at bedside 1L removed. Weaned off levophed with IVF.  Lactate trending down - will continue to trend lactate  - will send for paracentesis for culture r/o SBP  - pending blood culture  - will continue vanc, cefipime,flagyl   Hepatic encephalopathy/alcoholic cirrhosis - will start lactulose 30g per OG tube q1hour x 3 until BM - will follow ammonia level  Hepatorenal - uncertain type 1 vs type 2.  Last creatine last year was 0.76.  Improved creatine with fluid resuscitation.   - will continue IVF management with NS; consider albumin and midodrine   Best practice:  Diet: NPO Pain/Anxiety/Delirium protocol (if indicated): none VAP protocol (if indicated): yes  DVT prophylaxis: lovenox GI prophylaxis: none Glucose control: ssi Mobility:  Code Status: full Family Communication: plan discussed with boyfriend by ED physician Disposition: ICU  Labs   CBC: Recent Labs  Lab Aug 03, 2018 0115 August 03, 2018 0126  WBC 15.9*  --   NEUTROABS 11.2*  --   HGB 8.6* 9.9*  HCT 26.1* 29.0*  MCV 110.1*  --   PLT 153  --     Basic Metabolic Panel: Recent Labs  Lab August 03, 2018 0115 03-Aug-2018 0126  NA 125* 125*  K 4.0 3.9  CL 81*  --    CO2 20*  --   GLUCOSE 130*  --   BUN 36*  --   CREATININE 5.21*  --   CALCIUM 8.3*  --    GFR: CrCl cannot be calculated (Unknown ideal weight.). Recent Labs  Lab 2018/08/03 0115 2018-08-03 0116  WBC 15.9*  --   LATICACIDVEN  --  >11.0*    Liver Function Tests: Recent Labs  Lab 08/03/18 0115  AST 91*  ALT 22  ALKPHOS 148*  BILITOT 7.0*  PROT 6.1*  ALBUMIN 2.0*   No results for input(s): LIPASE, AMYLASE in the last 168 hours. Recent Labs  Lab 08/03/2018 0115  AMMONIA 276*    ABG    Component Value Date/Time   PHART 7.434 01/03/2012 1115   PCO2ART 35.7 01/03/2012 1115   PO2ART 94.0 01/03/2012 1115   HCO3 22.6 2018/08/03 0126   TCO2 24 08/03/2018 0126   ACIDBASEDEF 1.0 08/03/18 0126   O2SAT 89.0 03-Aug-2018 0126     Coagulation Profile: Recent Labs  Lab 08/03/18 0115  INR 1.7*    Cardiac Enzymes: No results for input(s): CKTOTAL, CKMB, CKMBINDEX, TROPONINI in the last 168 hours.  HbA1C: Hgb A1c MFr Bld  Date/Time Value Ref Range Status  07/26/2011 01:29 PM 4.9 <5.7 % Final    Comment:    (NOTE)                                                                       According to the ADA Clinical Practice Recommendations for 2011, when HbA1c is used as a screening test:  >=6.5%   Diagnostic of Diabetes Mellitus           (if abnormal result is confirmed) 5.7-6.4%   Increased risk of developing Diabetes Mellitus References:Diagnosis and Classification of Diabetes Mellitus,Diabetes Care,2011,34(Suppl 1):S62-S69 and Standards of Medical Care in         Diabetes - 2011,Diabetes Care,2011,34 (Suppl 1):S11-S61.    CBG: Recent Labs  Lab Aug 03, 2018 0211  GLUCAP 116*    Review of Systems:     Past Medical History  She,  has a past medical history of Alcoholic liver disease (HCC), Arthritis, Ascites (09/22/11), Dysrhythmia, Fracture, humerus (07/2011), GERD (gastroesophageal reflux disease), High cholesterol, History of blood transfusion (07/2011),  Hypertension, Myocardial infarction (HCC), Osteomyelitis (HCC), Osteoporosis, Pancreatitis (several years ago), Peripheral vascular disease (HCC), and Staph infection.   Surgical History    Past Surgical History:  Procedure Laterality Date  . AMPUTATION     two toes on left foot  . ARTHROSCOPIC REPAIR ACL     right knee  . BUNIONECTOMY     both feet  . ESOPHAGOGASTRODUODENOSCOPY  09/23/2011  Procedure: ESOPHAGOGASTRODUODENOSCOPY (EGD);  Surgeon: Shirley FriarVincent C. Schooler, MD;  [facility];  Service: Endoscopy;  Laterality: N/A;  . ESOPHAGOGASTRODUODENOSCOPY (EGD) WITH PROPOFOL N/A 08/22/2012   Procedure: ESOPHAGOGASTRODUODENOSCOPY (EGD) WITH PROPOFOL;  Surgeon: Willis ModenaWilliam Outlaw, MD;  Location: [facility] ENDOSCOPY;  Service: Endoscopy;  Laterality: N/A;  . EUS N/A 03/25/2015   Procedure: UPPER ENDOSCOPIC ULTRASOUND (EUS) RADIAL;  Surgeon: Willis ModenaWilliam Outlaw, MD;  Location: [facility] ENDOSCOPY;  Service: Endoscopy;  Laterality: N/A;  . FINE NEEDLE ASPIRATION N/A 03/25/2015   Procedure: FINE NEEDLE ASPIRATION (FNA) RADIAL;  Surgeon: Willis ModenaWilliam Outlaw, MD;  Location: [facility] ENDOSCOPY;  Service: Endoscopy;  Laterality: N/A;  . FRACTURE SURGERY  2013   Right elbow  . JOINT REPLACEMENT     right hip   . LEG SURGERY Left    Correction of unequal leg length  . ORIF HUMERUS FRACTURE  07/26/2011   Procedure: OPEN REDUCTION INTERNAL FIXATION (ORIF) DISTAL HUMERUS FRACTURE;  Surgeon: Budd PalmerMichael H Handy, MD;  Location: [facility] OR;  Service: Orthopedics;  Laterality: Right;  . ORIF WRIST FRACTURE Left 12/26/2014   Procedure: OPEN REDUCTION INTERNAL FIXATION (ORIF) WRIST FRACTURE;  Surgeon: Cammy CopaScott Gregory Dean, MD;  Location: [facility] OR;  Service: Orthopedics;  Laterality: Left;  . PARACENTESIS  09/22/11   2.2L  . TOTAL HIP ARTHROPLASTY  ~ 2003   right  . TRANSMETATARSAL AMPUTATION  02/16/2012   Procedure: TRANSMETATARSAL AMPUTATION;  Surgeon: Nadara MustardMarcus V Duda, MD;  [facility];  Service: Orthopedics;  Laterality: Left;  . TUBAL LIGATION   1980     Social History   reports that she has never smoked. She has never used smokeless tobacco. She reports that she does not drink alcohol or use drugs.   Family History   Her family history includes Cancer in her father.   Allergies Allergies  Allergen Reactions  . Codeine Hives    Causes blisters 09/02/16:  Tolerating Morphine  . Tramadol Other (See Comments)    Loss of consciousness   . Tylenol [Acetaminophen] Rash    Breaks out in blisters all over her body.     Home Medications  Prior to Admission medications   Medication Sig Start Date End Date Taking? Authorizing Provider  alendronate (FOSAMAX) 70 MG tablet Take 70 mg by mouth once a week. Saturday 03/04/15   [provider]  allopurinol (ZYLOPRIM) 100 MG tablet Take 100 mg by mouth 2 (two) times daily.    [provider]  Calcium Carb-Cholecalciferol (CALCIUM 600 + D PO) Take 1 tablet by mouth 2 (two) times daily.    [provider]  cephALEXin (KEFLEX) 500 MG capsule Take 1 capsule (500 mg total) by mouth 4 (four) times daily. 04/14/17   Margarita Grizzleay, Danielle, MD  folic acid (FOLVITE) 400 MCG tablet Take 400 mcg by mouth daily.    [provider]  gabapentin (NEURONTIN) 100 MG capsule Take 1 capsule (100 mg total) by mouth 3 (three) times daily. 04/28/17   Adonis HugueninZamora, Erin R, NP  HYDROmorphone (DILAUDID) 2 MG tablet  09/19/16   [provider]  lactulose (CHRONULAC) 10 GM/15ML solution Take 5 mLs (3.337 g total) by mouth 3 (three) times daily. 09/26/16   Adonis HugueninZamora, Erin R, NP  mirtazapine (REMERON) 15 MG tablet Take 1 tablet (15 mg total) by mouth at bedtime. 03/26/12   Brent Bullaitch, Erik D, MD  potassium chloride SA (K-DUR,KLOR-CON) 20 MEQ tablet Take 20 mEq by mouth daily. 01/29/16   [provider]  propranolol (INDERAL) 10 MG tablet Take 0.5 tablets (  5 mg total) by mouth 2 (two) times daily. 09/04/16   Osvaldo Shipper, MD  spironolactone (ALDACTONE) 50 MG tablet Take 50 mg by mouth daily.     [provider]  Thiamine HCl (VITAMIN B-1) 250 MG tablet Take 250 mg by mouth daily.    [provider]  CALCIUM-VITAMIN D PO Take 1 tablet by mouth 2 (two) times daily.  03/26/12  [provider]     Critical care time: 45 minutes

## 2018-07-28 NOTE — ED Notes (Signed)
Intubated by dr wickline 

## 2018-07-28 NOTE — ED Notes (Signed)
Several attempts unsuccessful to place a ng or an Psychologist, clinical has placed the last og.   Port chest xray

## 2018-07-28 NOTE — ED Notes (Signed)
NO OG OR NG DOCTOR UNABLE TO PLACE EITHER

## 2018-07-28 NOTE — ED Notes (Signed)
CBG Results of 116 reported to RN, Thayer Ohm C.

## 2018-07-28 NOTE — ED Notes (Signed)
P[reparikng to intubte

## 2018-07-28 NOTE — ED Notes (Signed)
Dr Bebe Shaggy aware Lactic >11

## 2018-07-28 NOTE — Progress Notes (Signed)
NAME:  Kathryn [name], MRN:  [id number], DOB:  [birth year], LOS: 0 ADMISSION DATE:  08/10/2018, CONSULTATION DATE:  08/12/2018 REFERRING MD:  ED, CHIEF COMPLAINT:  AMS   Brief History    68 year old female history of alcholic cirrhosis/hepatic encephalopathy presented to ED 08/11/2018 with altered mental status, agonal breathing.  She was started with pacing with paramedics and epi drip.  Hypertensive and hypothermic on presentation in ed.  Pacing removed, epi drip d/c.  Became hypotensive, continued AMS.  Lactate 11, wbc 15 with left shift.  Started on vanc, cefipime, flagyl in ed.  Started on levophed.  CT head subtle loss of gray-white diff, suspic for evolving acute ischemic left PCA territory.  Discussed with neurology in ed, likely artifact. Also decompensation per significant other over days; out of window for tpa given chronicity of symptoms.    rec MRI when metabolic derranagment normalize.  Intubated for airway protection.    Past Medical History  Alcoholic liver disease with previous admit for HE  Significant Hospital Events     Consults:    Procedures:  ETT 07/27/2018  Significant Diagnostic Tests:  Ammonia 276 Lactate 11 Wbc 15 Ct head 08/07/2018 read as possible evolving acute ischemic left PCA stroke; neurology thought artifact  Micro Data:  Blood cx pending U/a negative   Antimicrobials:  vanc 08/15/2018 cefipime 07/24/2018 Flagyl 08/02/2018   Interim history/subjective:   ,07/27/2018 - > in renal failure creat 4.47 but better. Baseline  0.76mg % in dec 2018. On vent. On pressors. Bil 7 -> 6. Poor lactate clearance at 4. MELD 36 (> 50% 3 month mortality). Child Pugh - Class C  D.w [name] -she live sin [place]. Says mom lives with a female room mate who is also an alcoholic. Daughter does not keep in touch with mom due to alcoholic behaviour of patient. There is a son to patient . Daughter said she and he get along well.   Results for Kathryn [name], Kathryn [name] (MRN [id number]) as of  08/09/2018 10:23  Ref. Range 07/23/2018 06:13  Color, Fluid Latest Ref Range: YELLOW  STRAW (A)  WBC, Fluid Latest Ref Range: 0 - 1,000 cu mm 335  Lymphs, Fluid Latest Units: % 5  Eos, Fluid Latest Units: % 0  Appearance, Fluid Latest Ref Range: CLEAR  HAZY (A)  Neutrophil Count, Fluid Latest Ref Range: 0 - 25 % 19  Monocyte-Macrophage-Serous Fluid Latest Ref Range: 50 - 90 % 76    Objective   Blood pressure (!) 79/64, pulse (!) 126, temperature (!) 97 F (36.1 C), resp. rate (!) 25, height 5\' 2"  (1.575 m), weight 50 kg, SpO2 98 %.    Vent Mode: PRVC FiO2 (%):  [40 %-100 %] 40 % Set Rate:  [12 bmp-16 bmp] 12 bmp Vt Set:  [100 mL-400 mL] 100 mL PEEP:  [5 cmH20] 5 cmH20 Plateau Pressure:  [15 cmH20-22 cmH20] 22 cmH20   Intake/Output Summary (Last 24 hours) at 07/30/2018 1020 Last data filed at 08/14/2018 0600 Gross per 24 hour  Intake 200 ml  Output 1300 ml  Net -1100 ml   Filed Weights   08/01/2018 0200  Weight: 50 kg     General Appearance:  Looks criticall ill Head:  Normocephalic, without obvious abnormality, atraumatic Eyes:  PERRL - yes, conjunctiva/corneas - jaundiced     Ears:  Normal external ear canals, both ears Nose:  G tube - no Throat:  ETT TUBE - yes , OG tube - yes  Neck:  Supple,  No enlargement/tenderness/nodules Lungs: Clear to auscultation bilaterally, Ventilator   Synchrony - yes Heart:  S1 and S2 normal, no murmur, CVP - no.  Pressors - yes levophed 20mcg Abdomen:  Soft, no masses, no organomegaly Genitalia / Rectal:  Not done Extremities:  Extremities- intact Skin:  ntact in exposed areas . Sacral area - x Neurologic:  Sedation - none -> RASS - -4    Anti-infectives (From admission, onward)   Start     Dose/Rate Route Frequency Ordered Stop   07/29/18 0600  ceFEPIme (MAXIPIME) 1 g in sodium chloride 0.9 % 100 mL IVPB     1 g 200 mL/hr over 30 Minutes Intravenous Every 24 hours July 16, 2018 0423     July 16, 2018 1400  metroNIDAZOLE (FLAGYL) IVPB 500 mg       500 mg 100 mL/hr over 60 Minutes Intravenous Every 8 hours July 16, 2018 0353     July 16, 2018 0423  vancomycin variable dose per unstable renal function (pharmacist dosing)      Does not apply See admin instructions July 16, 2018 0423     July 16, 2018 0400  ceFEPIme (MAXIPIME) 2 g in sodium chloride 0.9 % 100 mL IVPB  Status:  Discontinued     2 g 200 mL/hr over 30 Minutes Intravenous  Once July 16, 2018 0353 July 16, 2018 0358   July 16, 2018 0145  ceFEPIme (MAXIPIME) 2 g in sodium chloride 0.9 % 100 mL IVPB     2 g 200 mL/hr over 30 Minutes Intravenous  Once July 16, 2018 0144 July 16, 2018 0246   July 16, 2018 0145  metroNIDAZOLE (FLAGYL) IVPB 500 mg     500 mg 100 mL/hr over 60 Minutes Intravenous  Once July 16, 2018 0144 July 16, 2018 0340   July 16, 2018 0145  vancomycin (VANCOCIN) IVPB 1000 mg/200 mL premix     1,000 mg 200 mL/hr over 60 Minutes Intravenous  Once July 16, 2018 0144 July 16, 2018 0446        LABS    PULMONARY Recent Labs  Lab July 16, 2018 0126 July 16, 2018 0555  PHART  --  7.558*  PCO2ART  --  24.6*  PO2ART  --  240*  HCO3 22.6 22.2  TCO2 24  --   O2SAT 89.0  --     CBC Recent Labs  Lab July 16, 2018 0115 July 16, 2018 0126 July 16, 2018 0419  HGB 8.6* 9.9* 7.8*  HCT 26.1* 29.0* 23.7*  WBC 15.9*  --  12.1*  PLT 153  --  117*    COAGULATION Recent Labs  Lab July 16, 2018 0115 July 16, 2018 0419  INR 1.7* 1.9*    CARDIAC  No results for input(s): TROPONINI in the last 168 hours. No results for input(s): PROBNP in the last 168 hours.   CHEMISTRY Recent Labs  Lab July 16, 2018 0115 July 16, 2018 0126 July 16, 2018 0419  NA 125* 125* 128*  K 4.0 3.9 2.9*  CL 81*  --  87*  CO2 20*  --  22  GLUCOSE 130*  --  125*  BUN 36*  --  32*  CREATININE 5.21*  --  4.47*  CALCIUM 8.3*  --  7.4*   Estimated Creatinine Clearance: 9.8 mL/min (A) (by C-G formula based on SCr of 4.47 mg/dL (H)).   LIVER Recent Labs  Lab July 16, 2018 0115 July 16, 2018 0419  AST 91* 58*  ALT 22 19  ALKPHOS 148* 123  BILITOT 7.0* 6.3*  PROT 6.1* 5.3*  ALBUMIN 2.0* 1.7*   INR 1.7* 1.9*     INFECTIOUS Recent Labs  Lab July 16, 2018 0419 July 16, 2018 0536 July 16, 2018 0829  LATICACIDVEN 5.5* 4.3* 4.1*  PROCALCITON 1.60  --   --  ENDOCRINE CBG (last 3)  Recent Labs    2018-08-24 0211 Aug 24, 2018 0812  GLUCAP 116* 136*         IMAGING x48h  - image(s) personally visualized  -   highlighted in bold Ct Abdomen Pelvis Wo Contrast  Result Date: 08/24/18 CLINICAL DATA:  Initial evaluation for acute abdominal distension. EXAM: CT ABDOMEN AND PELVIS WITHOUT CONTRAST TECHNIQUE: Multidetector CT imaging of the abdomen and pelvis was performed following the standard protocol without IV contrast. COMPARISON:  Prior MRI from 09/30/2015. FINDINGS: Lower chest: Small to moderate layering bilateral pleural effusions with associated atelectasis. Visualized lungs are otherwise clear. Hepatobiliary: Hepatic cirrhosis with severe steatosis. No appreciable intrahepatic lesions on this noncontrast examination. Multiple layering stones noted within the gallbladder lumen. No appreciable biliary dilatation. Pancreas: Multiple calcifications seen throughout the pancreas, compatible with chronic pancreatitis. No definite acute peripancreatic inflammation. No loculated peripancreatic collections. Spleen: Spleen within normal limits. Adrenals/Urinary Tract: Adrenal glands are normal. Kidneys equal in size without nephrolithiasis or hydronephrosis. No radiopaque calculi seen along the course of either renal collecting system. No appreciable hydroureter. Bladder appears to be largely decompressed with a Foley catheter in place. Gas lucency within the bladder lumen compatible with catheterization. Stomach/Bowel: Enteric tube in place, coiled back on itself within the stomach before coursing superiorly into the esophagus, also seen on prior radiograph. Stomach largely decompressed without acute abnormality. No evidence for bowel obstruction. Appendix within normal limits. Diffuse wall thickening  about the colon favored to be related to hepatic enteropathy, although acute colitis could also have this appearance. No other definite acute inflammatory changes seen about the bowels. Vascular/Lymphatic: Mild to moderate atherosclerosis. No aneurysm. No adenopathy. Reproductive: 2.3 cm uterine fibroid noted. Uterus and ovaries otherwise unremarkable. Other: Moderate to large volume ascites throughout the abdomen and pelvis. Associated diffuse mesenteric edema. No free intraperitoneal air. Musculoskeletal: Right total hip arthroplasty in place. Remote posttraumatic deformity noted at the left pubic rami. Chronic L2 compression fracture noted. Multiple remotely healed left-sided rib fractures. No acute osseous abnormality. No worrisome osseous lesions. IMPRESSION: 1. Cirrhosis with moderate to large volume ascites throughout the abdomen and pelvis. 2. Diffuse wall thickening about the colon, favored to be related to hepatic enteropathy, although acute colitis could also have this appearance. 3. Enteric tube coiled back on itself within the stomach before coursing superiorly into the esophagus, also seen on prior radiograph. Repositioning recommended. 4. Cholelithiasis. 5. Sequelae of chronic pancreatitis. 6. Small to moderate layering bilateral pleural effusions with associated atelectasis. 7. Fibroid uterus. Electronically Signed   By: Rise Mu M.D.   On: 24-Aug-2018 05:19   Dg Abd 1 View  Result Date: 08/24/18 CLINICAL DATA:  Enteric tube placement EXAM: ABDOMEN - 1 VIEW COMPARISON:  2018-08-24 CT abdomen/pelvis. FINDINGS: Enteric tube loops in the fundus with the tip in the body of the stomach. No appreciable kink. No dilated small bowel loops. No evidence of pneumatosis or pneumoperitoneum. Small bilateral pleural effusions, left greater than right. No radiopaque nephrolithiasis. Partially visualized right total hip arthroplasty. IMPRESSION: Enteric tube loops in the fundus with the tip in the  body of the stomach, with no appreciable kink. Nonobstructive bowel gas pattern. Small bilateral pleural effusions, left greater than right. Electronically Signed   By: Delbert Phenix M.D.   On: Aug 24, 2018 07:31   Ct Head Wo Contrast  Result Date: 08-24-18 CLINICAL DATA:  Initial evaluation for acute altered mental status. EXAM: CT HEAD WITHOUT CONTRAST CT CERVICAL SPINE WITHOUT CONTRAST TECHNIQUE: Multidetector CT imaging  of the head and cervical spine was performed following the standard protocol without intravenous contrast. Multiplanar CT image reconstructions of the cervical spine were also generated. COMPARISON:  Prior CT from 09/01/2016. FINDINGS: CT HEAD FINDINGS Brain: Mildly advanced cerebral and cerebellar atrophy. Underlying mild chronic small vessel ischemic disease. No acute intracranial hemorrhage. Subtle loss of gray-white matter differentiation seen at the left occipital pole, suspicious for evolving acute left PCA territory infarct (series 4, image 10). No associated mass effect. No other acute large vessel territory infarct. No mass lesion, midline shift or mass effect. No hydrocephalus. No extra-axial fluid collection. Vascular: No hyperdense vessel. Scattered vascular calcifications noted within the carotid siphons. Skull: Scalp soft tissues demonstrate no acute finding. Calvarium intact. Sinuses/Orbits: Globes and orbital soft tissues within normal limits. Left gaze noted. Chronic left sphenoid sinusitis. Trace layering opacity noted within the right sphenoid. Bilateral nasal trumpet in place. Trace left mastoid effusion noted. Other: None. CT CERVICAL SPINE FINDINGS Alignment: Examination technically limited by patient positioning. Trace 3 mm retrolisthesis of C3 on C4, with trace anterolisthesis of C7 on T1 and T1 on T2. Underlying cervicothoracic scoliosis. Skull base and vertebrae: Skull base intact. Normal C1-2 articulations are preserved in the dens is intact. Congenital incomplete  fusion of the posterior ring of C1 noted. Vertebral body heights maintained. Partial butterfly vertebra noted at C7. No acute fracture. Soft tissues and spinal canal: No acute soft tissue abnormality within the neck. No prevertebral edema. Spinal canal within normal limits. Vascular calcifications about the carotid bifurcations. Disc levels: Moderate cervical spondylolysis at C3-4 through C6-7. Prominent right-sided facet arthrosis at C2-3. Upper chest: Small layering bilateral pleural effusions, right slightly larger than left. Fluid density noted within the upper esophageal lumen. Visualized upper chest otherwise unremarkable. Other: None. IMPRESSION: CT BRAIN: 1. Subtle loss of gray-white matter differentiation involving the left occipital pole, suspicious for evolving acute ischemic left PCA territory infarct. No intracranial hemorrhage. 2. Underlying atrophy with chronic small vessel ischemic disease. 3. Chronic left sphenoid sinusitis. CT CERVICAL SPINE: 1. No acute traumatic injury within cervical spine. 2. Moderate cervical spondylolysis at C3-4 through C6-7. 3. Small layering bilateral pleural effusions. Electronically Signed   By: Rise Mu M.D.   On: 08/08/2018 02:08   Ct Cervical Spine Wo Contrast  Result Date: 08/08/18 CLINICAL DATA:  Initial evaluation for acute altered mental status. EXAM: CT HEAD WITHOUT CONTRAST CT CERVICAL SPINE WITHOUT CONTRAST TECHNIQUE: Multidetector CT imaging of the head and cervical spine was performed following the standard protocol without intravenous contrast. Multiplanar CT image reconstructions of the cervical spine were also generated. COMPARISON:  Prior CT from 09/01/2016. FINDINGS: CT HEAD FINDINGS Brain: Mildly advanced cerebral and cerebellar atrophy. Underlying mild chronic small vessel ischemic disease. No acute intracranial hemorrhage. Subtle loss of gray-white matter differentiation seen at the left occipital pole, suspicious for evolving acute  left PCA territory infarct (series 4, image 10). No associated mass effect. No other acute large vessel territory infarct. No mass lesion, midline shift or mass effect. No hydrocephalus. No extra-axial fluid collection. Vascular: No hyperdense vessel. Scattered vascular calcifications noted within the carotid siphons. Skull: Scalp soft tissues demonstrate no acute finding. Calvarium intact. Sinuses/Orbits: Globes and orbital soft tissues within normal limits. Left gaze noted. Chronic left sphenoid sinusitis. Trace layering opacity noted within the right sphenoid. Bilateral nasal trumpet in place. Trace left mastoid effusion noted. Other: None. CT CERVICAL SPINE FINDINGS Alignment: Examination technically limited by patient positioning. Trace 3 mm retrolisthesis of C3 on C4,  with trace anterolisthesis of C7 on T1 and T1 on T2. Underlying cervicothoracic scoliosis. Skull base and vertebrae: Skull base intact. Normal C1-2 articulations are preserved in the dens is intact. Congenital incomplete fusion of the posterior ring of C1 noted. Vertebral body heights maintained. Partial butterfly vertebra noted at C7. No acute fracture. Soft tissues and spinal canal: No acute soft tissue abnormality within the neck. No prevertebral edema. Spinal canal within normal limits. Vascular calcifications about the carotid bifurcations. Disc levels: Moderate cervical spondylolysis at C3-4 through C6-7. Prominent right-sided facet arthrosis at C2-3. Upper chest: Small layering bilateral pleural effusions, right slightly larger than left. Fluid density noted within the upper esophageal lumen. Visualized upper chest otherwise unremarkable. Other: None. IMPRESSION: CT BRAIN: 1. Subtle loss of gray-white matter differentiation involving the left occipital pole, suspicious for evolving acute ischemic left PCA territory infarct. No intracranial hemorrhage. 2. Underlying atrophy with chronic small vessel ischemic disease. 3. Chronic left  sphenoid sinusitis. CT CERVICAL SPINE: 1. No acute traumatic injury within cervical spine. 2. Moderate cervical spondylolysis at C3-4 through C6-7. 3. Small layering bilateral pleural effusions. Electronically Signed   By: Rise Mu M.D.   On: Aug 05, 2018 02:08   Dg Chest Port 1 View  Result Date: August 05, 2018 CLINICAL DATA:  Initial evaluation for intubation. EXAM: PORTABLE CHEST 1 VIEW COMPARISON:  Prior radiograph from earlier the same day. FINDINGS: Endotracheal tube in place with tip positioned 18 mm above the carina. Defibrillator pads overlie the lower chest. Moderate left pleural effusion, increased from previous. Increased opacity at the left lung base likely reflects atelectasis given the rapid progressive nature of this finding. Mild right basilar atelectasis with small right effusion. No pulmonary edema. No pneumothorax. Multiple remotely healed left-sided rib fractures noted. No acute osseous finding. IMPRESSION: 1. Tip of the endotracheal tube positioned 18 mm above the carina. 2. Moderate left pleural effusion, increased from previous. Associated left basilar opacity also increased, favored to reflect progressive atelectasis given the rapid change in appearance. 3. Mild right basilar atelectasis with small right effusion. Electronically Signed   By: Rise Mu M.D.   On: 08/05/18 04:48   Dg Chest Port 1 View  Result Date: August 05, 2018 CLINICAL DATA:  Altered mental status. EXAM: PORTABLE CHEST 1 VIEW COMPARISON:  09/01/2016 FINDINGS: Low lung volumes. Normal heart size with aortic atherosclerosis. Streaky right lung base atelectasis. Retrocardiac opacity may be atelectasis or small pleural effusion. Skin fold projects over the right chest, no pneumothorax. No pulmonary edema. Remote left rib fractures. IMPRESSION: Low lung volumes with streaky right lung base atelectasis. Retrocardiac opacity may be atelectasis or small pleural effusion. Aortic Atherosclerosis (ICD10-I70.0).  Electronically Signed   By: Narda Rutherford M.D.   On: 08/05/2018 01:40   Dg Abd Portable 1 View  Result Date: 08/05/18 CLINICAL DATA:  Initial evaluation for enteric tube placement EXAM: PORTABLE ABDOMEN - 1 VIEW COMPARISON:  None. FINDINGS: Enteric tube in place, seen coiled back on itself in the stomach, thin coursing superiorly into the esophagus. Tip not visualized. Defibrillator pads overlie the chest. Bowel gas pattern is nonobstructive. IMPRESSION: Enteric tube coiled back on itself within the stomach, then coursing superiorly back into the esophagus, tip not visualized. Repositioning recommended. Electronically Signed   By: Rise Mu M.D.   On: 08/05/2018 04:50     Resolved Hospital Problem list     Assessment & Plan:  ASSESSMENT / PLAN:  PULMONARY A:  Acute resp failure due to ESLD and stroke and possible sepsis  P:   Full vent support  CARDIAC A: At risk mi  P: Track trop  VASCULAR A:   Circulator shock  P:  Goal MAP > 65 with levophed 2L Fluid bolus No CVL (see goals)  RENAL A:  Acute renal failure - with MODS P:  Fluid bolus No CRRT (see goals)  ELECTROLYTES A:  Hypokalemia P: Replete judiciously monitor   GASTROINTESTINAL A:   Cirrhosis - child C with high MELD - poor prognosis  P:   PPI TF Lactulose scheduled  HEMATOLOGIC A:  Anemia - no overt bleeding but at risk for GI bleed   P:  - PRBC for hgb </= 6.9gm%    - exceptions are   -  if ACS susepcted/confirmed then transfuse for hgb </= 8.0gm%,  or    -  active bleeding with hemodynamic instability, then transfuse regardless of hemoglobin value   At at all times try to transfuse 1 unit prbc as possible with exception of active hemorrhage    INFECTIOUS A:   Likely septic shock P:   abx as above   ENDOCRINE A:   At risk hypo and hyperglycemia   P:   sso  NEUROLOGIC A:   Likely stroke - not TPA candiate Comatose 07-30-2018   P:   lactulsoe Dc  diprivan  GLOBAL Poor prognosis / MODS    Best practice:  Diet: NPO Pain/Anxiety/Delirium protocol (if indicated): none VAP protocol (if indicated): yes DVT prophylaxis: loveox GI prophylaxis: none Glucose control: ssi Mobility:  Code Status: full Family Communication: 4/11 - d/w daugther - No CPR. No CRRT. No CVL but continue full Rx measures.  Disposition: ICU   ATTESTATION & SIGNATURE   The patient Kathryn [name] is critically ill with multiple organ systems failure and requires high complexity decision making for assessment and support, frequent evaluation and titration of therapies, application of advanced monitoring technologies and extensive interpretation of multiple databases.   Critical Care Time devoted to patient care services described in this note is  30  Minutes. This time reflects time of care of this signee Dr Kalman Shan. This critical care time does not reflect procedure time, or teaching time or supervisory time of PA/NP/Med student/Med Resident etc but could involve care discussion time     Dr. Kalman Shan, M.D., [facility].C.P Pulmonary and Critical Care Medicine Staff Physician [facility] [place] [facility] Pager: [phone], If no answer or between  15:00h - 7:00h: call [phone]  2018/07/30 10:21 AM

## 2018-07-28 NOTE — ED Provider Notes (Signed)
[facility] EMERGENCY DEPARTMENT Provider Note   CSN: [id number] Arrival date & time: Aug 27, 2018  0102    History   Chief Complaint Chief Complaint  Patient presents with   Altered Mental Status  Level 5 caveat due to altered mental status  HPI Kathryn [name] is a 68 y.o. female.     The history is provided by the EMS personnel. The history is limited by the condition of the patient.  Altered Mental Status  Presenting symptoms: unresponsiveness   Severity:  Severe Episode history:  Continuous Timing:  Constant Progression:  Worsening Chronicity:  New PT With history of alcoholic liver disease, GERD Presents from home for unresponsiveness. EMS reports they were called to the home as patient was found unresponsive.  Unclear time of onset. Patient had agonal respirations, and oxygen was applied.  She was tachycardic with thready pulse, EMS reports they started using a pacemaker.  She was also placed on epi drip. No other details are known at this time Past Medical History:  Diagnosis Date   Alcoholic liver disease (HCC)    Arthritis    "all over"   Ascites 09/22/11   "first time for , now resolved   Dysrhythmia    "irregular"   Fracture, humerus 07/2011   right   GERD (gastroesophageal reflux disease)    High cholesterol    History of blood transfusion 07/2011   "when I had elbow surgery"   Hypertension    not current   Myocardial infarction (HCC)    NSTEMI 12/2011 in setting of septic shock   Osteomyelitis (HCC)    left foot   Osteoporosis    Pancreatitis several years ago   Peripheral vascular disease (HCC)    Staph infection    left foot    Patient Active Problem List   Diagnosis Date Noted   Intractable pain 09/01/2016   Nausea vomiting and diarrhea 09/01/2016   Fall at home    Right hip pain    Hepatic encephalopathy (HCC) 02/18/2016   Visual hallucinations 02/18/2016   Hypotension 02/18/2016   Acute renal failure  (ARF) (HCC) 02/18/2016   Chronic pain 02/18/2016   Abdominal pain 03/26/2015   Acute on chronic pancreatitis (HCC) 03/26/2015   Hypokalemia 03/26/2015   Palpitation 03/26/2015   Hypomagnesemia 03/26/2015   Insomnia 03/17/2012   Diarrhea 02/23/2012   Hypoalbuminemia 02/21/2012   Severe protein-calorie malnutrition (HCC) 10/21/2011   Ascites 09/22/2011   Alcoholic liver disease (HCC) 09/22/2011   EtOH dependence (HCC) 07/27/2011   Essential hypertension 09/16/2009   SCOLIOSIS 08/17/2009   UNSPECIFIED MONONEURITIS OF UPPER LIMB 06/12/2008   HYPERLIPIDEMIA 02/07/2007   GERD 02/07/2007   OSTEOARTHRITIS 02/07/2007   Osteoporosis 02/07/2007    Past Surgical History:  Procedure Laterality Date   AMPUTATION     two toes on left foot   ARTHROSCOPIC REPAIR ACL     right knee   BUNIONECTOMY     both feet   ESOPHAGOGASTRODUODENOSCOPY  09/23/2011   Procedure: ESOPHAGOGASTRODUODENOSCOPY (EGD);  Surgeon: Shirley Friar, MD;  Location: [facility] ENDOSCOPY;  Service: Endoscopy;  Laterality: N/A;   ESOPHAGOGASTRODUODENOSCOPY (EGD) WITH PROPOFOL N/A 08/22/2012   Procedure: ESOPHAGOGASTRODUODENOSCOPY (EGD) WITH PROPOFOL;  Surgeon: Willis Modena, MD;  Location: [facility] ENDOSCOPY;  Service: Endoscopy;  Laterality: N/A;   EUS N/A 03/25/2015   Procedure: UPPER ENDOSCOPIC ULTRASOUND (EUS) RADIAL;  Surgeon: Willis Modena, MD;  Location: [facility] ENDOSCOPY;  Service: Endoscopy;  Laterality: N/A;   FINE NEEDLE ASPIRATION N/A 03/25/2015  Procedure: FINE NEEDLE ASPIRATION (FNA) RADIAL;  Surgeon: Willis Modena, MD;  Location: [facility] ENDOSCOPY;  Service: Endoscopy;  Laterality: N/A;   FRACTURE SURGERY  2013   Right elbow   JOINT REPLACEMENT     right hip    LEG SURGERY Left    Correction of unequal leg length   ORIF HUMERUS FRACTURE  07/26/2011   Procedure: OPEN REDUCTION INTERNAL FIXATION (ORIF) DISTAL HUMERUS FRACTURE;  Surgeon: Budd Palmer, MD;  Location: [facility] OR;  Service: Orthopedics;   Laterality: Right;   ORIF WRIST FRACTURE Left 12/26/2014   Procedure: OPEN REDUCTION INTERNAL FIXATION (ORIF) WRIST FRACTURE;  Surgeon: Cammy Copa, MD;  Location: [facility] OR;  Service: Orthopedics;  Laterality: Left;   PARACENTESIS  09/22/11   2.2L   TOTAL HIP ARTHROPLASTY  ~ 2003   right   TRANSMETATARSAL AMPUTATION  02/16/2012   Procedure: TRANSMETATARSAL AMPUTATION;  Surgeon: Nadara Mustard, MD;  Location: [facility] OR;  Service: Orthopedics;  Laterality: Left;   TUBAL LIGATION  1980     OB History   No obstetric history on file.      Home Medications    Prior to Admission medications   Medication Sig Start Date End Date Taking? Authorizing Provider  alendronate (FOSAMAX) 70 MG tablet Take 70 mg by mouth once a week. Saturday 03/04/15   [provider]  allopurinol (ZYLOPRIM) 100 MG tablet Take 100 mg by mouth 2 (two) times daily.    [provider]  Calcium Carb-Cholecalciferol (CALCIUM 600 + D PO) Take 1 tablet by mouth 2 (two) times daily.    [provider]  cephALEXin (KEFLEX) 500 MG capsule Take 1 capsule (500 mg total) by mouth 4 (four) times daily. 04/14/17   Margarita Grizzle, MD  folic acid (FOLVITE) 400 MCG tablet Take 400 mcg by mouth daily.    [provider]  gabapentin (NEURONTIN) 100 MG capsule Take 1 capsule (100 mg total) by mouth 3 (three) times daily. 04/28/17   Adonis Huguenin, NP  HYDROmorphone (DILAUDID) 2 MG tablet  09/19/16   [provider]  lactulose (CHRONULAC) 10 GM/15ML solution Take 5 mLs (3.337 g total) by mouth 3 (three) times daily. 09/26/16   Adonis Huguenin, NP  mirtazapine (REMERON) 15 MG tablet Take 1 tablet (15 mg total) by mouth at bedtime. 03/26/12   Brent Bulla, MD  potassium chloride SA (K-DUR,KLOR-CON) 20 MEQ tablet Take 20 mEq by mouth daily. 01/29/16   [provider]  propranolol (INDERAL) 10 MG tablet Take 0.5 tablets (5 mg total) by mouth 2 (two) times daily. 09/04/16   Osvaldo Shipper, MD    spironolactone (ALDACTONE) 50 MG tablet Take 50 mg by mouth daily.    [provider]  Thiamine HCl (VITAMIN B-1) 250 MG tablet Take 250 mg by mouth daily.    [provider]  CALCIUM-VITAMIN D PO Take 1 tablet by mouth 2 (two) times daily.  03/26/12  [provider]    Family History Family History  Problem Relation Age of Onset   Cancer Father        stomach, prostate, skin    Social History Social History   Tobacco Use   Smoking status: Never Smoker   Smokeless tobacco: Never Used  Substance Use Topics   Alcohol use: No    Comment: "last drink was day before OR in 07/2011"   Drug use: No     Allergies   Codeine; Tramadol; and Tylenol [acetaminophen]  Review of Systems Review of Systems  Unable to perform ROS: Mental status change     Physical Exam Updated Vital Signs BP (!) 136/107    Pulse (!) 102    Temp (!) 95.4 F (35.2 C)    Resp (!) 24    SpO2 (!) 86%   Physical Exam CONSTITUTIONAL: Unresponsive HEAD: Normocephalic/atraumatic,no signs of trauma EYES: Eyes deviated to the left ENMT: Mucous membranes moist, trumpets in place, nonrebreather in place SPINE/BACK:no bruising noted CV: Tachycardic LUNGS: Tachypneic, coarse breath sounds noted ABDOMEN: soft, protuberant GU: Patient wearing a diaper and covered in yellow/nonbloody stool NEURO: Pt is unresponsive, she does not respond to voice or pain EXTREMITIES: pulses normal/equal, full ROM, pelvis is stable, all other extremities/joints palpated/ranged and nontender, IO left LE SKIN: Cool to touch PSYCH: unable to assess  ED Treatments / Results  Labs (all labs ordered are listed, but only abnormal results are displayed) Labs Reviewed  COMPREHENSIVE METABOLIC PANEL - Abnormal; Notable for the following components:      Result Value   Sodium 125 (*)    Chloride 81 (*)    CO2 20 (*)    Glucose, Bld 130 (*)    BUN 36 (*)    Creatinine, Ser 5.21 (*)    Calcium 8.3 (*)     Total Protein 6.1 (*)    Albumin 2.0 (*)    AST 91 (*)    Alkaline Phosphatase 148 (*)    Total Bilirubin 7.0 (*)    GFR calc non Af Amer 8 (*)    GFR calc Af Amer 9 (*)    Anion gap 24 (*)    All other components within normal limits  CBC WITH DIFFERENTIAL/PLATELET - Abnormal; Notable for the following components:   WBC 15.9 (*)    RBC 2.37 (*)    Hemoglobin 8.6 (*)    HCT 26.1 (*)    MCV 110.1 (*)    MCH 36.3 (*)    RDW 19.5 (*)    Neutro Abs 11.2 (*)    Monocytes Absolute 1.5 (*)    Abs Immature Granulocytes 0.13 (*)    All other components within normal limits  AMMONIA - Abnormal; Notable for the following components:   Ammonia 276 (*)    All other components within normal limits  PROTIME-INR - Abnormal; Notable for the following components:   Prothrombin Time 19.8 (*)    INR 1.7 (*)    All other components within normal limits  APTT - Abnormal; Notable for the following components:   aPTT 38 (*)    All other components within normal limits  ACETAMINOPHEN LEVEL - Abnormal; Notable for the following components:   Acetaminophen (Tylenol), Serum <10 (*)    All other components within normal limits  LACTIC ACID, PLASMA - Abnormal; Notable for the following components:   Lactic Acid, Venous >11.0 (*)    All other components within normal limits  CBG MONITORING, ED - Abnormal; Notable for the following components:   Glucose-Capillary 116 (*)    All other components within normal limits  POCT I-STAT EG7 - Abnormal; Notable for the following components:   pH, Ven 7.432 (*)    pCO2, Ven 33.9 (*)    pO2, Ven 54.0 (*)    Sodium 125 (*)    Calcium, Ion 0.89 (*)    HCT 29.0 (*)    Hemoglobin 9.9 (*)    All other components within normal limits  URINE CULTURE  CULTURE, BLOOD (ROUTINE  X 2)  CULTURE, BLOOD (ROUTINE X 2)  ETHANOL  SALICYLATE LEVEL  URINALYSIS, COMPLETE (UACMP) WITH MICROSCOPIC  RAPID URINE DRUG SCREEN, HOSP PERFORMED  LACTIC ACID, PLASMA    EKG EKG  Interpretation  Date/Time:  Saturday July 28 2018 02:08:09 EDT Ventricular Rate:  94 PR Interval:    QRS Duration: 65 QT Interval:  429 QTC Calculation: 540 R Axis:   114 Text Interpretation:  Sinus rhythm Right axis deviation Low voltage, precordial leads Nonspecific T abnormalities, lateral leads Prolonged QT interval Interpretation limited secondary to artifact Confirmed by Zadie Rhine ([id number]) on 07/30/2018 2:27:11 AM   Radiology Ct Head Wo Contrast  Result Date: 08/16/2018 CLINICAL DATA:  Initial evaluation for acute altered mental status. EXAM: CT HEAD WITHOUT CONTRAST CT CERVICAL SPINE WITHOUT CONTRAST TECHNIQUE: Multidetector CT imaging of the head and cervical spine was performed following the standard protocol without intravenous contrast. Multiplanar CT image reconstructions of the cervical spine were also generated. COMPARISON:  Prior CT from 09/01/2016. FINDINGS: CT HEAD FINDINGS Brain: Mildly advanced cerebral and cerebellar atrophy. Underlying mild chronic small vessel ischemic disease. No acute intracranial hemorrhage. Subtle loss of gray-white matter differentiation seen at the left occipital pole, suspicious for evolving acute left PCA territory infarct (series 4, image 10). No associated mass effect. No other acute large vessel territory infarct. No mass lesion, midline shift or mass effect. No hydrocephalus. No extra-axial fluid collection. Vascular: No hyperdense vessel. Scattered vascular calcifications noted within the carotid siphons. Skull: Scalp soft tissues demonstrate no acute finding. Calvarium intact. Sinuses/Orbits: Globes and orbital soft tissues within normal limits. Left gaze noted. Chronic left sphenoid sinusitis. Trace layering opacity noted within the right sphenoid. Bilateral nasal trumpet in place. Trace left mastoid effusion noted. Other: None. CT CERVICAL SPINE FINDINGS Alignment: Examination technically limited by patient positioning. Trace 3 mm  retrolisthesis of C3 on C4, with trace anterolisthesis of C7 on T1 and T1 on T2. Underlying cervicothoracic scoliosis. Skull base and vertebrae: Skull base intact. Normal C1-2 articulations are preserved in the dens is intact. Congenital incomplete fusion of the posterior ring of C1 noted. Vertebral body heights maintained. Partial butterfly vertebra noted at C7. No acute fracture. Soft tissues and spinal canal: No acute soft tissue abnormality within the neck. No prevertebral edema. Spinal canal within normal limits. Vascular calcifications about the carotid bifurcations. Disc levels: Moderate cervical spondylolysis at C3-4 through C6-7. Prominent right-sided facet arthrosis at C2-3. Upper chest: Small layering bilateral pleural effusions, right slightly larger than left. Fluid density noted within the upper esophageal lumen. Visualized upper chest otherwise unremarkable. Other: None. IMPRESSION: CT BRAIN: 1. Subtle loss of gray-white matter differentiation involving the left occipital pole, suspicious for evolving acute ischemic left PCA territory infarct. No intracranial hemorrhage. 2. Underlying atrophy with chronic small vessel ischemic disease. 3. Chronic left sphenoid sinusitis. CT CERVICAL SPINE: 1. No acute traumatic injury within cervical spine. 2. Moderate cervical spondylolysis at C3-4 through C6-7. 3. Small layering bilateral pleural effusions. Electronically Signed   By: Rise Mu M.D.   On: 08/12/2018 02:08   Ct Cervical Spine Wo Contrast  Result Date: 07/20/2018 CLINICAL DATA:  Initial evaluation for acute altered mental status. EXAM: CT HEAD WITHOUT CONTRAST CT CERVICAL SPINE WITHOUT CONTRAST TECHNIQUE: Multidetector CT imaging of the head and cervical spine was performed following the standard protocol without intravenous contrast. Multiplanar CT image reconstructions of the cervical spine were also generated. COMPARISON:  Prior CT from 09/01/2016. FINDINGS: CT HEAD FINDINGS Brain:  Mildly advanced cerebral and  cerebellar atrophy. Underlying mild chronic small vessel ischemic disease. No acute intracranial hemorrhage. Subtle loss of gray-white matter differentiation seen at the left occipital pole, suspicious for evolving acute left PCA territory infarct (series 4, image 10). No associated mass effect. No other acute large vessel territory infarct. No mass lesion, midline shift or mass effect. No hydrocephalus. No extra-axial fluid collection. Vascular: No hyperdense vessel. Scattered vascular calcifications noted within the carotid siphons. Skull: Scalp soft tissues demonstrate no acute finding. Calvarium intact. Sinuses/Orbits: Globes and orbital soft tissues within normal limits. Left gaze noted. Chronic left sphenoid sinusitis. Trace layering opacity noted within the right sphenoid. Bilateral nasal trumpet in place. Trace left mastoid effusion noted. Other: None. CT CERVICAL SPINE FINDINGS Alignment: Examination technically limited by patient positioning. Trace 3 mm retrolisthesis of C3 on C4, with trace anterolisthesis of C7 on T1 and T1 on T2. Underlying cervicothoracic scoliosis. Skull base and vertebrae: Skull base intact. Normal C1-2 articulations are preserved in the dens is intact. Congenital incomplete fusion of the posterior ring of C1 noted. Vertebral body heights maintained. Partial butterfly vertebra noted at C7. No acute fracture. Soft tissues and spinal canal: No acute soft tissue abnormality within the neck. No prevertebral edema. Spinal canal within normal limits. Vascular calcifications about the carotid bifurcations. Disc levels: Moderate cervical spondylolysis at C3-4 through C6-7. Prominent right-sided facet arthrosis at C2-3. Upper chest: Small layering bilateral pleural effusions, right slightly larger than left. Fluid density noted within the upper esophageal lumen. Visualized upper chest otherwise unremarkable. Other: None. IMPRESSION: CT BRAIN: 1. Subtle loss of  gray-white matter differentiation involving the left occipital pole, suspicious for evolving acute ischemic left PCA territory infarct. No intracranial hemorrhage. 2. Underlying atrophy with chronic small vessel ischemic disease. 3. Chronic left sphenoid sinusitis. CT CERVICAL SPINE: 1. No acute traumatic injury within cervical spine. 2. Moderate cervical spondylolysis at C3-4 through C6-7. 3. Small layering bilateral pleural effusions. Electronically Signed   By: Rise Mu M.D.   On: 08/13/2018 02:08   Dg Chest Port 1 View  Result Date: 08/04/2018 CLINICAL DATA:  Altered mental status. EXAM: PORTABLE CHEST 1 VIEW COMPARISON:  09/01/2016 FINDINGS: Low lung volumes. Normal heart size with aortic atherosclerosis. Streaky right lung base atelectasis. Retrocardiac opacity may be atelectasis or small pleural effusion. Skin fold projects over the right chest, no pneumothorax. No pulmonary edema. Remote left rib fractures. IMPRESSION: Low lung volumes with streaky right lung base atelectasis. Retrocardiac opacity may be atelectasis or small pleural effusion. Aortic Atherosclerosis (ICD10-I70.0). Electronically Signed   By: Narda Rutherford M.D.   On: 07/30/2018 01:40    Procedures Procedure Name: Intubation Date/Time: 08/09/2018 4:00 AM Performed by: Zadie Rhine, MD Pre-anesthesia Checklist: Patient identified Oxygen Delivery Method: Ambu bag Preoxygenation: Pre-oxygenation with 100% oxygen Induction Type: Rapid sequence Ventilation: Mask ventilation without difficulty Laryngoscope Size: Glidescope Grade View: Grade I Number of attempts: 1 Placement Confirmation: ETT inserted through vocal cords under direct vision,  Positive ETCO2 and Breath sounds checked- equal and bilateral Secured at: 23 cm Tube secured with: ETT holder       CRITICAL CARE Performed by: Joya Gaskins Total critical care time: 50 minutes Critical care time was exclusive of separately billable procedures  and treating other patients. Critical care was necessary to treat or prevent imminent or life-threatening deterioration. Critical care was time spent personally by me on the following activities: development of treatment plan with patient and/or surrogate as well as nursing, discussions with consultants, evaluation of patient's response to  treatment, examination of patient, obtaining history from patient or surrogate, ordering and performing treatments and interventions, ordering and review of laboratory studies, ordering and review of radiographic studies, pulse oximetry and re-evaluation of patient's condition.   Medications Ordered in ED Medications  0.9 %  sodium chloride infusion ( Intravenous New Bag/Given 08/02/2018 0300)  vancomycin (VANCOCIN) IVPB 1000 mg/200 mL premix (1,000 mg Intravenous New Bag/Given 08/01/2018 0346)  norepinephrine (LEVOPHED) 4mg  in 250mL premix infusion (10 mcg/min Intravenous Rate/Dose Change 08/04/2018 0351)  enoxaparin (LOVENOX) injection 40 mg (has no administration in time range)  sodium chloride 0.9 % bolus 1,000 mL (has no administration in time range)    And  sodium chloride 0.9 % bolus 500 mL (has no administration in time range)    And  sodium chloride 0.9 % bolus 250 mL (has no administration in time range)  metroNIDAZOLE (FLAGYL) IVPB 500 mg (has no administration in time range)  fentaNYL (SUBLIMAZE) injection 50 mcg (has no administration in time range)  fentaNYL (SUBLIMAZE) injection 50 mcg (has no administration in time range)  propofol (DIPRIVAN) 1000 MG/100ML infusion (has no administration in time range)  midazolam (VERSED) injection 1 mg (has no administration in time range)  midazolam (VERSED) injection 1 mg (has no administration in time range)  docusate (COLACE) 50 MG/5ML liquid 100 mg (has no administration in time range)  sodium chloride 0.9 % bolus 1,000 mL (0 mLs Intravenous Stopped 08/04/2018 0212)    And  sodium chloride 0.9 % bolus 500 mL (0  mLs Intravenous Stopped 08/10/2018 0245)  ceFEPIme (MAXIPIME) 2 g in sodium chloride 0.9 % 100 mL IVPB (0 g Intravenous Stopped 07/27/2018 0246)  metroNIDAZOLE (FLAGYL) IVPB 500 mg (0 mg Intravenous Stopped 07/19/2018 0340)  etomidate (AMIDATE) injection (20 mg Intravenous Given 08/08/2018 0337)  rocuronium (ZEMURON) injection (80 mg Intravenous Given 07/30/2018 0338)     Initial Impression / Assessment and Plan / ED Course  I have reviewed the triage vital signs and the nursing notes.  Pertinent labs & imaging results that were available during my care of the patient were reviewed by me and considered in my medical decision making (see chart for details).        1:20 AM Presents for altered mental status of unclear etiology. She is mildly hypothermic. She has tachycardic and currently hypertensive.  Pacing has been removed. Stop epi drip Work-up has begun, will follow closely Pt currently not hypoxic, will defer intubation for now 2:00 AM Patient is now hypotensive with mild hypothermia. She appears tachypneic, eyes are open but she is not responding to voice. Code sepsis has now been called. Work-up is pending at this time 2:47 AM CT head shows possible left PCA ischemic stroke.  This was reviewed by neurology Dr. Wilford CornerArora He feels this may be artifact, and we also do not know a last known well.  Patient will require an MRI at some point during hospitalization tPA in stroke considered but not given due to: Onset over 3-4.5hours    Also found to have hepatic encephalopathy, acute renal failure, hyponatremia, and lactic acidosis  2:50 AM I was able to speak to her significant other, Elijah Birkom McDermot [phone] He states for the past several days patient has been having nausea and vomiting, decreased p.o., and diarrhea.  No known fever or cough.  She has refused to come to the hospital until tonight.  Last known well approximately 1800 He found lying on her side with her eyes open with change in  mental  status Informed of the critical nature of her illness  3:08 AM D/w dr deterding with critical care for admission Mental status not improving, patient is becoming more tachypneic and requiring higher levels of oxygen, she will need to be intubated Plan to intubate patient in the ED   Sepsis - Repeat Assessment  Performed at:    0410am  Vitals     Blood pressure (!) 86/67, pulse (!) 108, temperature (!) 95.8 F (35.4 C), resp. rate 15, height 1.575 m ( ), weight 50 kg, SpO2 (!)100%  Heart:     Tachycardic  Lungs:    Rhonchi  Capillary Refill:   <2 sec  Peripheral Pulse:   Radial pulse palpable  Skin:     Pale   4:12 AM Patient intubated without difficulty.  She was given levo phed prior to intubation to help combat any intraprocedure hypotension.  Her blood pressures improving.  Current pulse ox is 100%.  Discussed the case with the admitting intensivist    Final Clinical Impressions(s) / ED Diagnoses   Final diagnoses:  Hepatic encephalopathy (HCC)  AKI (acute kidney injury) (HCC)  Lactic acidosis  Dehydration    ED Discharge Orders    None       Zadie Rhine, MD 08/08/2018 [phone]

## 2018-07-28 NOTE — Procedures (Signed)
Paracentesis Procedure Note  Indications: sepsis  Procedure Details  Consent: Informed consent was obtained. Risks of the procedure were discussed including: infection, bleeding, pain, bowel perforation.  Maximum sterile technique was used including antiseptics, cap, gloves, gown, hand hygiene, mask and sheet. Skin prep: Chlorhexidine; local anesthetic administered. The abdominal wall was punctured in the left lower quadrant. Fluid was obtained without any difficulties and minimal blood loss.  A dressing was applied to the wound and wound care instructions were provided.   Findings 1000 ml of clear ascitic fluid was obtained. A sample was sent to Pathology for cytology and cell counts, as well as for infection analysis.  Complications:  None; patient tolerated the procedure well.        Condition: stable

## 2018-07-28 NOTE — Progress Notes (Signed)
Pharmacy Antibiotic Note  Kathryn [name] is a 68 y.o. female admitted on 07/26/2018 with AMS/VDRF and acute renal failure.  Pharmacy has been consulted for Vancomycin and Cefepime  Dosing.  Vancomycin 1 g IV given in ED at  0345  Plan: NO further vancomycin needed at this time. F/U renal function Cefepime 1 g IV q24h  Height: 5\' 2"  (157.5 cm) Weight: 110 lb 3.7 oz (50 kg) IBW/kg (Calculated) : 50.1  Temp (24hrs), Avg:95.5 F (35.3 C), Min:94.2 F (34.6 C), Max:96.1 F (35.6 C)  Recent Labs  Lab 07/19/2018 0115 08/03/2018 0116  WBC 15.9*  --   CREATININE 5.21*  --   LATICACIDVEN  --  >11.0*    Estimated Creatinine Clearance: 8.4 mL/min (A) (by C-G formula based on SCr of 5.21 mg/dL (H)).    Allergies  Allergen Reactions  . Codeine Hives    Causes blisters 09/02/16:  Tolerating Morphine  . Tramadol Other (See Comments)    Loss of consciousness   . Tylenol [Acetaminophen] Rash    Breaks out in blisters all over her body.     Eddie Candle 08/09/2018 4:18 AM

## 2018-07-28 NOTE — ED Notes (Signed)
The pt has c,losed her eyes at present  She does not respond to any stimuli  Breathing becoming more labored

## 2018-07-28 NOTE — Progress Notes (Addendum)
Initial Nutrition Assessment  DOCUMENTATION CODES:   Not applicable  INTERVENTION:   Initiate tube feeding: -Jevity 1.2 @ 41 ml/hr via OGT (984 ml) -30 ml Prostat BID  Provides: 1380 kcals, 85 grams protein, 794 ml free water. Meets 105% of calorie needs and 100% of protein needs.   NUTRITION DIAGNOSIS:   Increased nutrient needs related to acute illness as evidenced by estimated needs.  GOAL:   Patient will meet greater than or equal to 90% of their needs  MONITOR:   Diet advancement, Weight trends, Labs, Vent status, TF tolerance, Skin  REASON FOR ASSESSMENT:   Consult, Ventilator Enteral/tube feeding initiation and management  ASSESSMENT:   Patient with PMH significant for alcoholic cirrhosis with previous admit for heaptic encephalopathy, HTN, HLD, and GERD . Presents this admission with AMS likely combination of sepsis, HE, and possible stroke.    RD working remotely.  Pt with ARF no plans for CRRT (DNR). Per CCM, prognosis is poor. RD consulted for TF initiation. Per RN, pt vomited after administration of lactulose. OGT was placed to suction (very little vomit). Discussed TF plan with RN, hopeful to start later this afternoon or early tomorrow.   Per chart records, multiple weights look to be stated, unsure if pt has lost weight recently. Will need to confirm once able. Will utilize EDW of 50 kg to estimate needs.   Unable to complete Nutrition-Focused physical exam at this time.   Patient is currently intubated on ventilator support MV: 8.5 L/min Temp (24hrs), Avg:98.2 F (36.8 C), Min:94.2 F (34.6 C), Max:99.9 F (37.7 C)  Propofol: discontinued   I/O: -1100 ml since admit UOP: 1300 ml x 24 hrs  Drips: levophed, 10 mEq KCl  Medications: SS novolog, lactulose TID Labs: Na 128 (L) K 2.9 (L) corrected calcium 9.2 (wdl) creatinine 4.47- improving   Diet Order:   Diet Order            Diet NPO time specified  Diet effective now               EDUCATION NEEDS:   Not appropriate for education at this time  Skin:  Skin Assessment: Reviewed RN Assessment  Last BM:  4/11  Height:   Ht Readings from Last 1 Encounters:  08/13/2018 5\' 2"  (1.575 m)    Weight:   Wt Readings from Last 1 Encounters:  08-13-2018 50 kg    Ideal Body Weight:  50 kg  BMI:  Body mass index is 20.16 kg/m.  Estimated Nutritional Needs:   Kcal:  1320 kcal  Protein:  75-95 grams  Fluid:  >/= 1.2 L/day  Vanessa Kick RD, LDN Clinical Nutrition Pager # - [phone]

## 2018-07-28 NOTE — ED Notes (Signed)
Pt placed on a bair hugger

## 2018-07-28 NOTE — Progress Notes (Signed)
RT NOTE:  Pt transported to CT, then 3M05 without event. Report given to [facility], RT.

## 2018-07-29 DIAGNOSIS — K7031 Alcoholic cirrhosis of liver with ascites: Secondary | ICD-10-CM

## 2018-07-29 DIAGNOSIS — R40243 Glasgow coma scale score 3-8, unspecified time: Secondary | ICD-10-CM

## 2018-07-29 DIAGNOSIS — K7290 Hepatic failure, unspecified without coma: Secondary | ICD-10-CM

## 2018-07-29 LAB — CBC WITH DIFFERENTIAL/PLATELET
Abs Immature Granulocytes: 0.22 10*3/uL — ABNORMAL HIGH (ref 0.00–0.07)
Basophils Absolute: 0 10*3/uL (ref 0.0–0.1)
Basophils Relative: 0 %
Eosinophils Absolute: 0 10*3/uL (ref 0.0–0.5)
Eosinophils Relative: 0 %
HCT: 20.2 % — ABNORMAL LOW (ref 36.0–46.0)
Hemoglobin: 6.5 g/dL — CL (ref 12.0–15.0)
Immature Granulocytes: 1 %
Lymphocytes Relative: 6 %
Lymphs Abs: 1.1 10*3/uL (ref 0.7–4.0)
MCH: 36.9 pg — ABNORMAL HIGH (ref 26.0–34.0)
MCHC: 32.2 g/dL (ref 30.0–36.0)
MCV: 114.8 fL — ABNORMAL HIGH (ref 80.0–100.0)
Monocytes Absolute: 1.3 10*3/uL — ABNORMAL HIGH (ref 0.1–1.0)
Monocytes Relative: 8 %
Neutro Abs: 15.1 10*3/uL — ABNORMAL HIGH (ref 1.7–7.7)
Neutrophils Relative %: 85 %
Platelets: 101 10*3/uL — ABNORMAL LOW (ref 150–400)
RBC: 1.76 MIL/uL — ABNORMAL LOW (ref 3.87–5.11)
RDW: 20.1 % — ABNORMAL HIGH (ref 11.5–15.5)
WBC: 17.8 10*3/uL — ABNORMAL HIGH (ref 4.0–10.5)
nRBC: 0 % (ref 0.0–0.2)

## 2018-07-29 LAB — GLUCOSE, CAPILLARY
Glucose-Capillary: 161 mg/dL — ABNORMAL HIGH (ref 70–99)
Glucose-Capillary: 186 mg/dL — ABNORMAL HIGH (ref 70–99)
Glucose-Capillary: 85 mg/dL (ref 70–99)

## 2018-07-29 LAB — PROTIME-INR
INR: 3.6 — ABNORMAL HIGH (ref 0.8–1.2)
Prothrombin Time: 35.5 seconds — ABNORMAL HIGH (ref 11.4–15.2)

## 2018-07-29 LAB — BASIC METABOLIC PANEL
Anion gap: 17 — ABNORMAL HIGH (ref 5–15)
BUN: 23 mg/dL (ref 8–23)
CO2: 9 mmol/L — ABNORMAL LOW (ref 22–32)
Calcium: 6.3 mg/dL — CL (ref 8.9–10.3)
Chloride: 107 mmol/L (ref 98–111)
Creatinine, Ser: 2.7 mg/dL — ABNORMAL HIGH (ref 0.44–1.00)
GFR calc Af Amer: 20 mL/min — ABNORMAL LOW (ref >60)
GFR calc non Af Amer: 18 mL/min — ABNORMAL LOW (ref >60)
Glucose, Bld: 178 mg/dL — ABNORMAL HIGH (ref 70–99)
Potassium: 2.7 mmol/L — CL (ref 3.5–5.1)
Sodium: 133 mmol/L — ABNORMAL LOW (ref 135–145)

## 2018-07-29 LAB — VANCOMYCIN, RANDOM: Vancomycin Rm: 10

## 2018-07-29 LAB — URINE CULTURE: Culture: NO GROWTH

## 2018-07-29 LAB — HEPATIC FUNCTION PANEL
ALT: 17 U/L (ref 0–44)
AST: 74 U/L — ABNORMAL HIGH (ref 15–41)
Albumin: 2.2 g/dL — ABNORMAL LOW (ref 3.5–5.0)
Alkaline Phosphatase: 79 U/L (ref 38–126)
Bilirubin, Direct: 5.3 mg/dL — ABNORMAL HIGH (ref 0.0–0.2)
Indirect Bilirubin: 4.7 mg/dL — ABNORMAL HIGH (ref 0.3–0.9)
Total Bilirubin: 10 mg/dL — ABNORMAL HIGH (ref 0.3–1.2)
Total Protein: 5.1 g/dL — ABNORMAL LOW (ref 6.5–8.1)

## 2018-07-29 LAB — LACTIC ACID, PLASMA: Lactic Acid, Venous: 7.8 mmol/L (ref 0.5–1.9)

## 2018-07-29 LAB — PHOSPHORUS: Phosphorus: 3.7 mg/dL (ref 2.5–4.6)

## 2018-07-29 LAB — MAGNESIUM: Magnesium: 0.9 mg/dL — CL (ref 1.7–2.4)

## 2018-07-29 LAB — TROPONIN I: Troponin I: 0.06 ng/mL

## 2018-07-29 MED ORDER — CALCIUM GLUCONATE-NACL 1-0.675 GM/50ML-% IV SOLN
1.0000 g | Freq: Once | INTRAVENOUS | Status: AC
  Administered 2018-07-29: 1000 mg via INTRAVENOUS
  Filled 2018-07-29: qty 50

## 2018-07-29 MED ORDER — VANCOMYCIN HCL 10 G IV SOLR
1500.0000 mg | Freq: Once | INTRAVENOUS | Status: AC
  Administered 2018-07-29: 1500 mg via INTRAVENOUS
  Filled 2018-07-29: qty 1500

## 2018-07-29 MED ORDER — GLYCOPYRROLATE 0.2 MG/ML IJ SOLN: 0.2000 mg | INTRAMUSCULAR | Status: DC | PRN

## 2018-07-29 MED ORDER — MORPHINE BOLUS VIA INFUSION
5.0000 mg | INTRAVENOUS | Status: DC | PRN
  Filled 2018-07-29: qty 5

## 2018-07-29 MED ORDER — SODIUM CHLORIDE 0.9 % IV SOLN: Freq: Once | INTRAVENOUS | Status: DC

## 2018-07-29 MED ORDER — MAGNESIUM SULFATE 4 GM/100ML IV SOLN
4.0000 g | Freq: Once | INTRAVENOUS | Status: DC
  Filled 2018-07-29: qty 100

## 2018-07-29 MED ORDER — POTASSIUM PHOSPHATES 15 MMOLE/5ML IV SOLN
30.0000 mmol | Freq: Once | INTRAVENOUS | Status: DC
  Filled 2018-07-29: qty 10

## 2018-07-29 MED ORDER — POLYVINYL ALCOHOL 1.4 % OP SOLN
1.0000 [drp] | Freq: Four times a day (QID) | OPHTHALMIC | Status: DC | PRN
  Filled 2018-07-29: qty 15

## 2018-07-29 MED ORDER — DIPHENHYDRAMINE HCL 50 MG/ML IJ SOLN: 25.0000 mg | INTRAMUSCULAR | Status: DC | PRN

## 2018-07-29 MED ORDER — DEXTROSE 5 % IV SOLN
INTRAVENOUS | Status: DC
  Administered 2018-07-29: via INTRAVENOUS

## 2018-07-29 MED ORDER — MAGNESIUM SULFATE 2 GM/50ML IV SOLN
2.0000 g | Freq: Once | INTRAVENOUS | Status: AC
  Administered 2018-07-29: 2 g via INTRAVENOUS
  Filled 2018-07-29: qty 50

## 2018-07-29 MED ORDER — POTASSIUM CHLORIDE 20 MEQ/15ML (10%) PO SOLN
40.0000 meq | Freq: Once | ORAL | Status: AC
  Administered 2018-07-29: 40 meq
  Filled 2018-07-29: qty 30

## 2018-07-29 MED ORDER — MORPHINE SULFATE (PF) 2 MG/ML IV SOLN: 2.0000 mg | INTRAVENOUS | Status: DC | PRN

## 2018-07-29 MED ORDER — MORPHINE 100MG IN NS 100ML (1MG/ML) PREMIX INFUSION
0.0000 mg/h | INTRAVENOUS | Status: DC
  Administered 2018-07-29: 5 mg/h via INTRAVENOUS
  Administered 2018-07-29 – 2018-07-30 (×6): 20 mg/h via INTRAVENOUS
  Filled 2018-07-29 (×9): qty 100

## 2018-07-29 MED ORDER — GLYCOPYRROLATE 1 MG PO TABS
1.0000 mg | ORAL_TABLET | ORAL | Status: DC | PRN
  Filled 2018-07-29: qty 1

## 2018-07-29 MED ORDER — POTASSIUM CHLORIDE 10 MEQ/100ML IV SOLN
10.0000 meq | INTRAVENOUS | Status: AC
  Administered 2018-07-29 (×4): 10 meq via INTRAVENOUS
  Filled 2018-07-29 (×4): qty 100

## 2018-07-29 MED ORDER — NOREPINEPHRINE 4 MG/250ML-% IV SOLN
INTRAVENOUS | Status: AC
  Administered 2018-07-29: 4 mg
  Filled 2018-07-29: qty 250

## 2018-07-29 NOTE — Progress Notes (Signed)
Patients daughter Shanda Bumps returned call stating that she had spoken with her brother (pt's son) and they have decided to go along with making the patient comfort care per previous conversation with Dr. Marchelle Gearing. They do not wish to be present. Offered video visit as well, Jessica declined.

## 2018-07-29 NOTE — Progress Notes (Signed)
Per RN - family want full comfort  Plan Terminal wean extubation orders sent     SIGNATURE    Dr. Kalman Shan, M.D., F.C.C.P,  Pulmonary and Critical Care Medicine Staff Physician, [facility] Director - Interstitial Lung Disease  Program  Pulmonary Fibrosis [facility] [place], [place], [address]  Pager: [phone], If no answer or between  15:00h - 7:00h: call [phone] Telephone: [phone]  1:41 PM 07/29/2018

## 2018-07-29 NOTE — Procedures (Signed)
Extubation Procedure Note  Patient Details:   Name: Kathryn [name] DOB: 17-Jul-1950 MRN: [id number]   Airway Documentation:    Vent end date: 07/29/18 Vent end time: 1510   Evaluation  O2 sats: stable throughout Complications: No apparent complications Patient did tolerate procedure well. Bilateral Breath Sounds: Diminished, Rhonchi   No   Pt was extubated and placed on RA per order of MD.   Merlene Laughter 07/29/2018, 3:18 PM

## 2018-07-29 NOTE — Progress Notes (Signed)
eLink Physician-Brief Progress Note Patient Name: Kathryn [name] DOB: 25-May-1950 MRN: [id number]   Date of Service  07/29/2018  HPI/Events of Note  Hypocalcemia, hypokalemia, hypomag, hypophos  eICU Interventions  Calcium, potassium, mag and phos replaced     Intervention Category Intermediate Interventions: Electrolyte abnormality - evaluation and management  DETERDING,ELIZABETH 07/29/2018, 5:53 AM

## 2018-07-29 NOTE — Progress Notes (Signed)
NAME:  Kathryn [name], MRN:  [id number], DOB:  02-27-1951, LOS: 1 ADMISSION DATE:  2018-08-12, CONSULTATION DATE:  08-12-18 REFERRING MD:  ED, CHIEF COMPLAINT:  AMS   Brief History    68 year old female history of alcholic cirrhosis/hepatic encephalopathy presented to ED 08-12-2018 with altered mental status, agonal breathing.  She was started with pacing with paramedics and epi drip.  Hypertensive and hypothermic on presentation in ed.  Pacing removed, epi drip d/c.  Became hypotensive, continued AMS.  Lactate 11, wbc 15 with left shift.  Started on vanc, cefipime, flagyl in ed.  Started on levophed.  CT head subtle loss of gray-white diff, suspic for evolving acute ischemic left PCA territory.  Discussed with neurology in ed, likely artifact. Also decompensation per significant other over days; out of window for tpa given chronicity of symptoms.    rec MRI when metabolic derranagment normalize.  Intubated for airway protection.    Past Medical History  Alcoholic liver disease with previous admit for HE  Significant Hospital Events     4/10 in renal failure creat 4.47 but better. Baseline  0.76mg % in dec 2018. On vent. On pressors. Bil 7 -> 6. Poor lactate clearance at 4. MELD 36 (> 50% 3 month mortality). Child Pugh - Class C  D.w [name] the [place] -she live sin [place]. Says mom lives with a female room mate who is also an alcoholic. Daughter does not keep in touch with mom due to alcoholic behaviour of patient. There is a son to patient . Daughter said she and he get along well.  Consults:    Procedures:  ETT 12-Aug-2018  Significant Diagnostic Tests:  Ammonia 276 Lactate 11 Wbc 15 Ct head 2018-08-12 read as possible evolving acute ischemic left PCA stroke; neurology thought artifact paracentes s- 4/11 - pmn 19  Micro Data:  Blood cx pending U/a negative   Antimicrobials:  vanc 2018/08/12 cefipime 08/12/18 Flagyl 2018/08/12   Interim history/subjective:   ,07/29/2018 - >  Comatose without  sedation on vent. 3 pressors - levophed 40, neo 160, vaso 0.03. Lactic acidosis worse. Renal function better but urine ouput low.. Still on vent . MELD score 39 and worse   Objective   Blood pressure 96/68, pulse 100, temperature (!) 97 F (36.1 C), resp. rate (!) 22, height  (1.575 m), weight 57.3 kg, SpO2 100 %.    Vent Mode: PRVC FiO2 (%):  [40 %] 40 % Set Rate:  [12 bmp] 12 bmp Vt Set:  [400 mL] 400 mL PEEP:  [5 cmH20] 5 cmH20 Plateau Pressure:  [15 cmH20-17 cmH20] 17 cmH20   Intake/Output Summary (Last 24 hours) at 07/29/2018 4098 Last data filed at 07/29/2018 0500 Gross per 24 hour  Intake 4209.13 ml  Output 1040 ml  Net 3169.13 ml   Filed Weights   08/12/18 0200 07/29/18 0500  Weight: 50 kg 57.3 kg    General Appearance:  Looks criticall ill on vent Head:  Normocephalic, without obvious abnormality, atraumatic Eyes:  PERRL - yes, conjunctiva/corneas - JAUNDICE     Ears:  Normal external ear canals, both ears Nose:  G tube - no Throat:  ETT TUBE - yes , OG tube - yes Neck:  Supple,  No enlargement/tenderness/nodules Lungs: Clear to auscultation bilaterally, Ventilator   Synchrony - yes Heart:  S1 and S2 normal, no murmur, CVP - no.  Pressors - yes x 3 Abdomen:  Soft, no masses, no organomegaly Genitalia / Rectal:  Not  done Extremities:  Extremities- intact Skin:  ntact in exposed areas . Sacral area - not examined Neurologic:  Sedation - none -> RASS - -4      Anti-infectives (From admission, onward)   Start     Dose/Rate Route Frequency Ordered Stop   07/29/18 0815  vancomycin (VANCOCIN) 1,500 mg in sodium chloride 0.9 % 500 mL IVPB     1,500 mg 250 mL/hr over 120 Minutes Intravenous  Once 07/29/18 0801     07/29/18 0600  ceFEPIme (MAXIPIME) 1 g in sodium chloride 0.9 % 100 mL IVPB     1 g 200 mL/hr over 30 Minutes Intravenous Every 24 hours 08/15/2018 0423     08/10/2018 1400  metroNIDAZOLE (FLAGYL) IVPB 500 mg     500 mg 100 mL/hr over 60 Minutes  Intravenous Every 8 hours 08/14/2018 0353     07/24/2018 0423  vancomycin variable dose per unstable renal function (pharmacist dosing)      Does not apply See admin instructions 07/22/2018 0423     08/02/2018 0400  ceFEPIme (MAXIPIME) 2 g in sodium chloride 0.9 % 100 mL IVPB  Status:  Discontinued     2 g 200 mL/hr over 30 Minutes Intravenous  Once 08/06/2018 0353 08/07/2018 0358   08/01/2018 0145  ceFEPIme (MAXIPIME) 2 g in sodium chloride 0.9 % 100 mL IVPB     2 g 200 mL/hr over 30 Minutes Intravenous  Once 07/25/2018 0144 08/10/2018 0246   08/05/2018 0145  metroNIDAZOLE (FLAGYL) IVPB 500 mg     500 mg 100 mL/hr over 60 Minutes Intravenous  Once 08/13/2018 0144 07/19/2018 0340   08/16/2018 0145  vancomycin (VANCOCIN) IVPB 1000 mg/200 mL premix     1,000 mg 200 mL/hr over 60 Minutes Intravenous  Once 08/10/2018 0144 07/22/2018 0446        LABS    PULMONARY Recent Labs  Lab 08/08/2018 0126 08/13/2018 0555  PHART  --  7.558*  PCO2ART  --  24.6*  PO2ART  --  240*  HCO3 22.6 22.2  TCO2 24  --   O2SAT 89.0  --     CBC Recent Labs  Lab 07/25/2018 0115 08/06/2018 0126 08/02/2018 0419 07/29/18 0432  HGB 8.6* 9.9* 7.8* 6.5*  HCT 26.1* 29.0* 23.7* 20.2*  WBC 15.9*  --  12.1* 17.8*  PLT 153  --  117* 101*    COAGULATION Recent Labs  Lab 07/21/2018 0115 07/29/2018 0419 07/29/18 0432  INR 1.7* 1.9* 3.6*    CARDIAC   Recent Labs  Lab 07/29/18 0432  TROPONINI 0.06*   No results for input(s): PROBNP in the last 168 hours.   CHEMISTRY Recent Labs  Lab 08/16/2018 0115 08/05/2018 0126 08/09/2018 0419 08/11/2018 1629 07/29/18 0432  NA 125* 125* 128*  --  133*  K 4.0 3.9 2.9*  --  2.7*  CL 81*  --  87*  --  107  CO2 20*  --  22  --  9*  GLUCOSE 130*  --  125*  --  178*  BUN 36*  --  32*  --  23  CREATININE 5.21*  --  4.47*  --  2.70*  CALCIUM 8.3*  --  7.4*  --  6.3*  MG  --   --   --  1.0* 0.9*  PHOS  --   --   --  2.5 3.7   Estimated Creatinine Clearance: 16.2 mL/min (A) (by C-G formula based on  SCr of 2.7 mg/dL (H)).  LIVER Recent Labs  Lab 08-24-18 0115 08/24/18 0419 07/29/18 0432  AST 91* 58* 74*  ALT 22 19 17   ALKPHOS 148* 123 79  BILITOT 7.0* 6.3* 10.0*  PROT 6.1* 5.3* 5.1*  ALBUMIN 2.0* 1.7* 2.2*  INR 1.7* 1.9* 3.6*     INFECTIOUS Recent Labs  Lab 24-Aug-2018 0419 24-Aug-2018 0536 Aug 24, 2018 0829 07/29/18 0432  LATICACIDVEN 5.5* 4.3* 4.1* 7.8*  PROCALCITON 1.60  --   --   --      ENDOCRINE CBG (last 3)  Recent Labs    2018-08-24 2034 08/24/18 2344 07/29/18 0748  GLUCAP 72 85 161*         IMAGING x48h  - image(s) personally visualized  -   highlighted in bold Ct Abdomen Pelvis Wo Contrast  Result Date: Aug 24, 2018 CLINICAL DATA:  Initial evaluation for acute abdominal distension. EXAM: CT ABDOMEN AND PELVIS WITHOUT CONTRAST TECHNIQUE: Multidetector CT imaging of the abdomen and pelvis was performed following the standard protocol without IV contrast. COMPARISON:  Prior MRI from 09/30/2015. FINDINGS: Lower chest: Small to moderate layering bilateral pleural effusions with associated atelectasis. Visualized lungs are otherwise clear. Hepatobiliary: Hepatic cirrhosis with severe steatosis. No appreciable intrahepatic lesions on this noncontrast examination. Multiple layering stones noted within the gallbladder lumen. No appreciable biliary dilatation. Pancreas: Multiple calcifications seen throughout the pancreas, compatible with chronic pancreatitis. No definite acute peripancreatic inflammation. No loculated peripancreatic collections. Spleen: Spleen within normal limits. Adrenals/Urinary Tract: Adrenal glands are normal. Kidneys equal in size without nephrolithiasis or hydronephrosis. No radiopaque calculi seen along the course of either renal collecting system. No appreciable hydroureter. Bladder appears to be largely decompressed with a Foley catheter in place. Gas lucency within the bladder lumen compatible with catheterization. Stomach/Bowel: Enteric tube in  place, coiled back on itself within the stomach before coursing superiorly into the esophagus, also seen on prior radiograph. Stomach largely decompressed without acute abnormality. No evidence for bowel obstruction. Appendix within normal limits. Diffuse wall thickening about the colon favored to be related to hepatic enteropathy, although acute colitis could also have this appearance. No other definite acute inflammatory changes seen about the bowels. Vascular/Lymphatic: Mild to moderate atherosclerosis. No aneurysm. No adenopathy. Reproductive: 2.3 cm uterine fibroid noted. Uterus and ovaries otherwise unremarkable. Other: Moderate to large volume ascites throughout the abdomen and pelvis. Associated diffuse mesenteric edema. No free intraperitoneal air. Musculoskeletal: Right total hip arthroplasty in place. Remote posttraumatic deformity noted at the left pubic rami. Chronic L2 compression fracture noted. Multiple remotely healed left-sided rib fractures. No acute osseous abnormality. No worrisome osseous lesions. IMPRESSION: 1. Cirrhosis with moderate to large volume ascites throughout the abdomen and pelvis. 2. Diffuse wall thickening about the colon, favored to be related to hepatic enteropathy, although acute colitis could also have this appearance. 3. Enteric tube coiled back on itself within the stomach before coursing superiorly into the esophagus, also seen on prior radiograph. Repositioning recommended. 4. Cholelithiasis. 5. Sequelae of chronic pancreatitis. 6. Small to moderate layering bilateral pleural effusions with associated atelectasis. 7. Fibroid uterus. Electronically Signed   By: Rise Mu M.D.   On: 2018-08-24 05:19   Dg Abd 1 View  Result Date: 24-Aug-2018 CLINICAL DATA:  Enteric tube placement EXAM: ABDOMEN - 1 VIEW COMPARISON:  08-24-18 CT abdomen/pelvis. FINDINGS: Enteric tube loops in the fundus with the tip in the body of the stomach. No appreciable kink. No dilated  small bowel loops. No evidence of pneumatosis or pneumoperitoneum. Small bilateral pleural effusions, left greater than right. No  radiopaque nephrolithiasis. Partially visualized right total hip arthroplasty. IMPRESSION: Enteric tube loops in the fundus with the tip in the body of the stomach, with no appreciable kink. Nonobstructive bowel gas pattern. Small bilateral pleural effusions, left greater than right. Electronically Signed   By: Delbert PhenixJason A Poff M.D.   On: 08/13/2018 07:31   Ct Head Wo Contrast  Result Date: 08/15/2018 CLINICAL DATA:  Initial evaluation for acute altered mental status. EXAM: CT HEAD WITHOUT CONTRAST CT CERVICAL SPINE WITHOUT CONTRAST TECHNIQUE: Multidetector CT imaging of the head and cervical spine was performed following the standard protocol without intravenous contrast. Multiplanar CT image reconstructions of the cervical spine were also generated. COMPARISON:  Prior CT from 09/01/2016. FINDINGS: CT HEAD FINDINGS Brain: Mildly advanced cerebral and cerebellar atrophy. Underlying mild chronic small vessel ischemic disease. No acute intracranial hemorrhage. Subtle loss of gray-white matter differentiation seen at the left occipital pole, suspicious for evolving acute left PCA territory infarct (series 4, image 10). No associated mass effect. No other acute large vessel territory infarct. No mass lesion, midline shift or mass effect. No hydrocephalus. No extra-axial fluid collection. Vascular: No hyperdense vessel. Scattered vascular calcifications noted within the carotid siphons. Skull: Scalp soft tissues demonstrate no acute finding. Calvarium intact. Sinuses/Orbits: Globes and orbital soft tissues within normal limits. Left gaze noted. Chronic left sphenoid sinusitis. Trace layering opacity noted within the right sphenoid. Bilateral nasal trumpet in place. Trace left mastoid effusion noted. Other: None. CT CERVICAL SPINE FINDINGS Alignment: Examination technically limited by patient  positioning. Trace 3 mm retrolisthesis of C3 on C4, with trace anterolisthesis of C7 on T1 and T1 on T2. Underlying cervicothoracic scoliosis. Skull base and vertebrae: Skull base intact. Normal C1-2 articulations are preserved in the dens is intact. Congenital incomplete fusion of the posterior ring of C1 noted. Vertebral body heights maintained. Partial butterfly vertebra noted at C7. No acute fracture. Soft tissues and spinal canal: No acute soft tissue abnormality within the neck. No prevertebral edema. Spinal canal within normal limits. Vascular calcifications about the carotid bifurcations. Disc levels: Moderate cervical spondylolysis at C3-4 through C6-7. Prominent right-sided facet arthrosis at C2-3. Upper chest: Small layering bilateral pleural effusions, right slightly larger than left. Fluid density noted within the upper esophageal lumen. Visualized upper chest otherwise unremarkable. Other: None. IMPRESSION: CT BRAIN: 1. Subtle loss of gray-white matter differentiation involving the left occipital pole, suspicious for evolving acute ischemic left PCA territory infarct. No intracranial hemorrhage. 2. Underlying atrophy with chronic small vessel ischemic disease. 3. Chronic left sphenoid sinusitis. CT CERVICAL SPINE: 1. No acute traumatic injury within cervical spine. 2. Moderate cervical spondylolysis at C3-4 through C6-7. 3. Small layering bilateral pleural effusions. Electronically Signed   By: Rise MuBenjamin  McClintock M.D.   On: 07/22/2018 02:08   Ct Cervical Spine Wo Contrast  Result Date: 08/11/2018 CLINICAL DATA:  Initial evaluation for acute altered mental status. EXAM: CT HEAD WITHOUT CONTRAST CT CERVICAL SPINE WITHOUT CONTRAST TECHNIQUE: Multidetector CT imaging of the head and cervical spine was performed following the standard protocol without intravenous contrast. Multiplanar CT image reconstructions of the cervical spine were also generated. COMPARISON:  Prior CT from 09/01/2016. FINDINGS:  CT HEAD FINDINGS Brain: Mildly advanced cerebral and cerebellar atrophy. Underlying mild chronic small vessel ischemic disease. No acute intracranial hemorrhage. Subtle loss of gray-white matter differentiation seen at the left occipital pole, suspicious for evolving acute left PCA territory infarct (series 4, image 10). No associated mass effect. No other acute large vessel territory infarct. No mass lesion,  midline shift or mass effect. No hydrocephalus. No extra-axial fluid collection. Vascular: No hyperdense vessel. Scattered vascular calcifications noted within the carotid siphons. Skull: Scalp soft tissues demonstrate no acute finding. Calvarium intact. Sinuses/Orbits: Globes and orbital soft tissues within normal limits. Left gaze noted. Chronic left sphenoid sinusitis. Trace layering opacity noted within the right sphenoid. Bilateral nasal trumpet in place. Trace left mastoid effusion noted. Other: None. CT CERVICAL SPINE FINDINGS Alignment: Examination technically limited by patient positioning. Trace 3 mm retrolisthesis of C3 on C4, with trace anterolisthesis of C7 on T1 and T1 on T2. Underlying cervicothoracic scoliosis. Skull base and vertebrae: Skull base intact. Normal C1-2 articulations are preserved in the dens is intact. Congenital incomplete fusion of the posterior ring of C1 noted. Vertebral body heights maintained. Partial butterfly vertebra noted at C7. No acute fracture. Soft tissues and spinal canal: No acute soft tissue abnormality within the neck. No prevertebral edema. Spinal canal within normal limits. Vascular calcifications about the carotid bifurcations. Disc levels: Moderate cervical spondylolysis at C3-4 through C6-7. Prominent right-sided facet arthrosis at C2-3. Upper chest: Small layering bilateral pleural effusions, right slightly larger than left. Fluid density noted within the upper esophageal lumen. Visualized upper chest otherwise unremarkable. Other: None. IMPRESSION: CT  BRAIN: 1. Subtle loss of gray-white matter differentiation involving the left occipital pole, suspicious for evolving acute ischemic left PCA territory infarct. No intracranial hemorrhage. 2. Underlying atrophy with chronic small vessel ischemic disease. 3. Chronic left sphenoid sinusitis. CT CERVICAL SPINE: 1. No acute traumatic injury within cervical spine. 2. Moderate cervical spondylolysis at C3-4 through C6-7. 3. Small layering bilateral pleural effusions. Electronically Signed   By: Rise Mu M.D.   On: Aug 20, 2018 02:08   Dg Chest Port 1 View  Result Date: Aug 20, 2018 CLINICAL DATA:  Initial evaluation for intubation. EXAM: PORTABLE CHEST 1 VIEW COMPARISON:  Prior radiograph from earlier the same day. FINDINGS: Endotracheal tube in place with tip positioned 18 mm above the carina. Defibrillator pads overlie the lower chest. Moderate left pleural effusion, increased from previous. Increased opacity at the left lung base likely reflects atelectasis given the rapid progressive nature of this finding. Mild right basilar atelectasis with small right effusion. No pulmonary edema. No pneumothorax. Multiple remotely healed left-sided rib fractures noted. No acute osseous finding. IMPRESSION: 1. Tip of the endotracheal tube positioned 18 mm above the carina. 2. Moderate left pleural effusion, increased from previous. Associated left basilar opacity also increased, favored to reflect progressive atelectasis given the rapid change in appearance. 3. Mild right basilar atelectasis with small right effusion. Electronically Signed   By: Rise Mu M.D.   On: Aug 20, 2018 04:48   Dg Chest Port 1 View  Result Date: Aug 20, 2018 CLINICAL DATA:  Altered mental status. EXAM: PORTABLE CHEST 1 VIEW COMPARISON:  09/01/2016 FINDINGS: Low lung volumes. Normal heart size with aortic atherosclerosis. Streaky right lung base atelectasis. Retrocardiac opacity may be atelectasis or small pleural effusion. Skin fold  projects over the right chest, no pneumothorax. No pulmonary edema. Remote left rib fractures. IMPRESSION: Low lung volumes with streaky right lung base atelectasis. Retrocardiac opacity may be atelectasis or small pleural effusion. Aortic Atherosclerosis (ICD10-I70.0). Electronically Signed   By: Narda Rutherford M.D.   On: 08/20/2018 01:40   Dg Abd Portable 1 View  Result Date: 08-20-18 CLINICAL DATA:  Initial evaluation for enteric tube placement EXAM: PORTABLE ABDOMEN - 1 VIEW COMPARISON:  None. FINDINGS: Enteric tube in place, seen coiled back on itself in the stomach, thin coursing superiorly  into the esophagus. Tip not visualized. Defibrillator pads overlie the chest. Bowel gas pattern is nonobstructive. IMPRESSION: Enteric tube coiled back on itself within the stomach, then coursing superiorly back into the esophagus, tip not visualized. Repositioning recommended. Electronically Signed   By: Rise Mu M.D.   On: 08/06/2018 04:50     Resolved Hospital Problem list     Assessment & Plan:  ASSESSMENT / PLAN:  PULMONARY A:  Acute resp failure due to ESLD and stroke and possible sepsis  07/29/2018 - does not meet sbt criteria due to mods     P:   Full vent support  CARDIAC A: At risk mi  P: Moniotr  VASCULAR A:   Circulator shock  07/29/2018 - worse and in refractory shock with 3 pressors  P:  Goal MAP > 65 with pressors but she is actively dying )  RENAL A:  Acute renal failure - with MODS 07/29/2018 ? Some better but clinically not really   P:  No CRRT (see goals)  ELECTROLYTES A:  Hypokalemia Hypomagnesemia - replted  P: Depends on goals of care    GASTROINTESTINAL A:   Cirrhosis - child C with high MELD - poor prognosis  P:   PPI TF Lactulose scheduled  HEMATOLOGIC A:  Anemia - no overt bleeding but at risk for GI bleed  07/29/2018 - no overt bleeding but meets PRBC threshold    P:  Transfuse depends on goals of  care    INFECTIOUS A:   Likely septic shock P:   abx as above   ENDOCRINE A:   At risk hypo and hyperglycemia   P:   sso  NEUROLOGIC A:   Likely stroke - not TPA candiate    Comatose 07/29/2018 and unchanged despite lactulose  P:   lactulsoe   GLOBAL Poor prognosis / MODS - actively dying    Best practice:  Diet: NPO Pain/Anxiety/Delirium protocol (if indicated): none VAP protocol (if indicated): yes DVT prophylaxis: loveox GI prophylaxis: none Glucose control: ssi Mobility:  Code Status: full Family Communication: 4/11 - d/w daugther - No CPR. No CRRT. No CVL but continue full Rx measures.   4/12 - informed daughter patient appears to be activey dying. She instructed [place] to continue same without new interventions. She is planning to get her brother / patient son  Disposition: ICU   ATTESTATION & SIGNATURE   The patient Kathryn [name] is critically ill with multiple organ systems failure and requires high complexity decision making for assessment and support, frequent evaluation and titration of therapies, application of advanced monitoring technologies and extensive interpretation of multiple databases.   Critical Care Time devoted to patient care services described in this note is  30  Minutes. This time reflects time of care of this signee Dr Kalman Shan. This critical care time does not reflect procedure time, or teaching time or supervisory time of PA/NP/Med student/Med Resident etc but could involve care discussion time     Dr. Kalman Shan, M.D., [facility].C.P Pulmonary and Critical Care Medicine Staff Physician [facility] Pager: [phone], If no answer or between  15:00h - 7:00h: call [phone]  07/29/2018 8:52 AM

## 2018-07-29 NOTE — Progress Notes (Signed)
Pharmacy Antibiotic Note  Kathryn [name] is a 68 y.o. female admitted on 31-Jul-2018 with sepsis.  Acute renal failure; vanc random level 10  Plan: Continue Cefepime 1 q24h, Flagyl 500 mg q8 Vanc 1500 mg x 1 Monitor cx, lot, VR prn  Height: 5\' 2"  (157.5 cm) Weight: 126 lb 5.2 oz (57.3 kg) IBW/kg (Calculated) : 50.1  Temp (24hrs), Avg:97.9 F (36.6 C), Min:95.7 F (35.4 C), Max:99.9 F (37.7 C)  Recent Labs  Lab 08/15/2018 0115  08/11/2018 0355 07/30/2018 0419 08/01/2018 0536 07/27/2018 0829 07/29/18 0432  WBC 15.9*  --   --  12.1*  --   --  17.8*  CREATININE 5.21*  --   --  4.47*  --   --  2.70*  LATICACIDVEN  --    < > 6.1* 5.5* 4.3* 4.1* 7.8*  VANCORANDOM  --   --   --   --   --   --  10   < > = values in this interval not displayed.    Estimated Creatinine Clearance: 16.2 mL/min (A) (by C-G formula based on SCr of 2.7 mg/dL (H)).    Allergies  Allergen Reactions  . Codeine Hives    Causes blisters 09/02/16:  Tolerating Morphine  . Tramadol Other (See Comments)    Loss of consciousness   . Tylenol [Acetaminophen] Rash    Breaks out in blisters all over her body.   Isaac Bliss, PharmD, BCPS, BCCCP Clinical Pharmacist [phone]  Please check AMION for all [facility] Pharmacy numbers  07/29/2018 8:05 AM

## 2018-07-29 NOTE — Progress Notes (Signed)
PHARMACY - PHYSICIAN COMMUNICATION CRITICAL VALUE ALERT - BLOOD CULTURE IDENTIFICATION (BCID)  [name] is an 68 y.o. female who presented to [facility] on 08-25-2018   Assessment: 1/4 gram pos rods  Name of physician (or Provider) Contacted: Dr Marchelle Gearing  Current antibiotics: Cefepime Flagyl Vanc  Changes to prescribed antibiotics recommended:  None  No results found for this or any previous visit.  Isaac Bliss, PharmD, BCPS, BCCCP Clinical Pharmacist [phone]  Please check AMION for all [facility] Pharmacy numbers  07/29/2018 11:34 AM

## 2018-07-29 NOTE — Progress Notes (Signed)
CDC referral started. The referral number is [phone].

## 2018-07-30 LAB — CULTURE, BLOOD (ROUTINE X 2): Special Requests: ADEQUATE

## 2018-07-30 LAB — PATHOLOGIST SMEAR REVIEW

## 2018-07-30 NOTE — Progress Notes (Signed)
Nutrition Brief Note  Chart reviewed. Pt now transitioning to comfort care. No further nutrition interventions warranted at this time.  Please re-consult as needed.    Evens Meno MS, RD, LDN, CNSC [phone] Pager  [phone] Weekend/On-Call Pager     

## 2018-07-30 NOTE — Progress Notes (Signed)
Spoke with Maisie Fus, Patient's significant other, offered for him to come see the patient due to comfort care. He declined to come due to difficulty of him getting here. This RN told him he would be informed when she passes and to call back anytime.

## 2018-07-30 NOTE — Progress Notes (Signed)
NAME:  Kathryn [name] Philley, MRN:  [id number], DOB:  1950-12-18, LOS: 2 ADMISSION DATE:  08/16/2018, CONSULTATION DATE:  07/22/2018 REFERRING MD:  ED, CHIEF COMPLAINT:  AMS   Brief History    68 year old female history of alcholic cirrhosis/hepatic encephalopathy presented to ED 08/05/2018 with altered mental status, agonal breathing.  She was started with pacing with paramedics and epi drip.  Hypertensive and hypothermic on presentation in ed.  Pacing removed, epi drip d/c.  Became hypotensive, continued AMS.  Lactate 11, wbc 15 with left shift.  Started on vanc, cefipime, flagyl in ed.  Started on levophed.  CT head subtle loss of gray-white diff, suspic for evolving acute ischemic left PCA territory.  Discussed with neurology in ed, likely artifact. Also decompensation per significant other over days; out of window for tpa given chronicity of symptoms.    rec MRI when metabolic derranagment normalize.  Intubated for airway protection.    Past Medical History  Alcoholic liver disease with previous admit for HE  Significant Hospital Events     4/10 in renal failure creat 4.47 but better. Baseline  0.76mg % in dec 2018. On vent. On pressors. Bil 7 -> 6. Poor lactate clearance at 4. MELD 36 (> 50% 3 month mortality). Child Pugh - Class C  D.w [name] [place] -she live [place]. Says mom lives with a female room mate who is also an alcoholic. Daughter does not keep in touch with mom due to alcoholic behaviour of patient. There is a son to patient . Daughter said she and he get along well.   412 - Comatose without sedation on vent. 3 pressors - levophed 40, neo 160, vaso 0.03. Lactic acidosis worse. Renal function better but urine ouput low.. Still on vent . MELD score 39 and worse   Consults:    Procedures:  ETT 07/24/2018  Significant Diagnostic Tests:  Ammonia 276 Lactate 11 Wbc 15 Ct head 08/15/2018 read as possible evolving acute ischemic left PCA stroke; neurology thought artifact paracentes  s- 4/11 - pmn 19  Micro Data:  Blood cx pending U/a negative   Antimicrobials:  vanc 07/18/2018 cefipime 08/03/2018 Flagyl 07/18/2018   Interim history/subjective:   ,07/30/2018 - >  Now terminal care. Off pressors. On 20mg /h morphine. Still alive. Comfortable. Making some urine  Objective   Blood pressure (!) 87/66, pulse 62, temperature 98.4 F (36.9 C), resp. rate (!) 7, height 5\' 2"  (1.575 m), weight 57.3 kg, SpO2 (!) 86 %.    Vent Mode: PRVC FiO2 (%):  [40 %] 40 % Set Rate:  [12 bmp] 12 bmp Vt Set:  [400 mL] 400 mL PEEP:  [5 cmH20] 5 cmH20 Plateau Pressure:  [22 cmH20] 22 cmH20   Intake/Output Summary (Last 24 hours) at 07/30/2018 1016 Last data filed at 07/30/2018 0600 Gross per 24 hour  Intake 2092.11 ml  Output 50 ml  Net 2042.11 ml   Filed Weights   08/16/2018 0200 07/29/18 0500  Weight: 50 kg 57.3 kg       Anti-infectives (From admission, onward)   Start     Dose/Rate Route Frequency Ordered Stop   07/29/18 0815  vancomycin (VANCOCIN) 1,500 mg in sodium chloride 0.9 % 500 mL IVPB     1,500 mg 250 mL/hr over 120 Minutes Intravenous  Once 07/29/18 0801 07/29/18 1031   07/29/18 0600  ceFEPIme (MAXIPIME) 1 g in sodium chloride 0.9 % 100 mL IVPB  Status:  Discontinued     1  g 200 mL/hr over 30 Minutes Intravenous Every 24 hours 08/02/2018 0423 07/29/18 1337   08/11/2018 1400  metroNIDAZOLE (FLAGYL) IVPB 500 mg  Status:  Discontinued     500 mg 100 mL/hr over 60 Minutes Intravenous Every 8 hours 08/14/2018 0353 07/29/18 1337   08/15/2018 0423  vancomycin variable dose per unstable renal function (pharmacist dosing)  Status:  Discontinued      Does not apply See admin instructions 07/27/2018 0423 07/29/18 1337   08/12/2018 0400  ceFEPIme (MAXIPIME) 2 g in sodium chloride 0.9 % 100 mL IVPB  Status:  Discontinued     2 g 200 mL/hr over 30 Minutes Intravenous  Once 07/19/2018 0353 08/14/2018 0358   07/22/2018 0145  ceFEPIme (MAXIPIME) 2 g in sodium chloride 0.9 % 100 mL IVPB     2 g 200  mL/hr over 30 Minutes Intravenous  Once 07/21/2018 0144 08/05/2018 0246   07/27/2018 0145  metroNIDAZOLE (FLAGYL) IVPB 500 mg     500 mg 100 mL/hr over 60 Minutes Intravenous  Once 07/29/2018 0144 07/27/2018 0340   07/23/2018 0145  vancomycin (VANCOCIN) IVPB 1000 mg/200 mL premix     1,000 mg 200 mL/hr over 60 Minutes Intravenous  Once 07/25/2018 0144 08/13/2018 0446        LABS    PULMONARY Recent Labs  Lab 07/23/2018 0126 08/11/2018 0555  PHART  --  7.558*  PCO2ART  --  24.6*  PO2ART  --  240*  HCO3 22.6 22.2  TCO2 24  --   O2SAT 89.0  --     CBC Recent Labs  Lab 07/21/2018 0115 07/30/2018 0126 07/22/2018 0419 07/29/18 0432  HGB 8.6* 9.9* 7.8* 6.5*  HCT 26.1* 29.0* 23.7* 20.2*  WBC 15.9*  --  12.1* 17.8*  PLT 153  --  117* 101*    COAGULATION Recent Labs  Lab 07/27/2018 0115 07/26/2018 0419 07/29/18 0432  INR 1.7* 1.9* 3.6*    CARDIAC   Recent Labs  Lab 07/29/18 0432  TROPONINI 0.06*   No results for input(s): PROBNP in the last 168 hours.   CHEMISTRY Recent Labs  Lab 07/22/2018 0115 08/05/2018 0126 08/13/2018 0419 08/05/2018 1629 07/29/18 0432  NA 125* 125* 128*  --  133*  K 4.0 3.9 2.9*  --  2.7*  CL 81*  --  87*  --  107  CO2 20*  --  22  --  9*  GLUCOSE 130*  --  125*  --  178*  BUN 36*  --  32*  --  23  CREATININE 5.21*  --  4.47*  --  2.70*  CALCIUM 8.3*  --  7.4*  --  6.3*  MG  --   --   --  1.0* 0.9*  PHOS  --   --   --  2.5 3.7   Estimated Creatinine Clearance: 16.2 mL/min (A) (by C-G formula based on SCr of 2.7 mg/dL (H)).   LIVER Recent Labs  Lab 08/06/2018 0115 07/27/2018 0419 07/29/18 0432  AST 91* 58* 74*  ALT 22 19 17   ALKPHOS 148* 123 79  BILITOT 7.0* 6.3* 10.0*  PROT 6.1* 5.3* 5.1*  ALBUMIN 2.0* 1.7* 2.2*  INR 1.7* 1.9* 3.6*     INFECTIOUS Recent Labs  Lab 08/03/2018 0419 08/12/2018 0536 08/14/2018 0829 07/29/18 0432  LATICACIDVEN 5.5* 4.3* 4.1* 7.8*  PROCALCITON 1.60  --   --   --      ENDOCRINE CBG (last 3)  Recent Labs    07/20/2018  2344 07/29/18 0748 07/29/18 1214  GLUCAP 85 161* 186*         IMAGING x48h  - image(s) personally visualized  -   highlighted in bold No results found.   Resolved Hospital Problem list     Assessment & Plan:  ASSESSMENT / PLAN:   Acute resp failure due to ESLD and stroke and possible sepsis Circulator shock - off pressors due to palliaton Acute renal failure - with MODS Cirrhosis - child C with high MELD - poor prognosis   ETOH - primar issues Anemia - no overt bleeding but at risk for GI bleed Likely septic shock Encephalopathy - Comatose     GLOBAL Poor prognosis / MODS - actively dying - terminal cae  Plan  Move to palliative unit Terminal care Continue morphine gtt   Daughter updated  SIGNATURE    Dr. Kalman Shan, M.D., F.C.C.P,  Pulmonary and Critical Care Medicine Staff Physician, [facility] Center Director - Interstitial Lung Disease  Program  Pulmonary Fibrosis [facility] at [facility] [place], [place], [address]  Pager: [phone], If no answer or between  15:00h - 7:00h: call [phone] Telephone: [phone]  10:28 AM 07/30/2018

## 2018-08-02 ENCOUNTER — Telehealth: Payer: Self-pay

## 2018-08-02 LAB — CULTURE, BLOOD (ROUTINE X 2): Culture: NO GROWTH

## 2018-08-02 LAB — CULTURE, BODY FLUID W GRAM STAIN -BOTTLE: Culture: NO GROWTH

## 2018-08-02 NOTE — Telephone Encounter (Signed)
Received dc from [employer] (original). DC is for cremation. Patient is a patient of Doctor Ramaswamy.  DC will be taken to [facility] [address] for signature.  On 08/03/2018 Received signed dc back from Doctor Mannam.. I called the funeral home to let them know dc is ready for pickup and also faxed a copy to the funeral home per the funeral home request.

## 2018-08-17 NOTE — Progress Notes (Signed)
Upon hourly rounds, noted pt not breathing. Called charge to witness at the bedside. Pt has transitioned, time noted 00:40.  Contacts called: Shanda Bumps [phone] msg left at 0056,   Maisie Fus [phone], made aware of pt's transitioning time, no information for funeral home given.   Morphine gtt wasted 90 ml in steri cycle with witnessed Press photographer Harriett Sine.  Pt post mortem prep completed. Awaiting information for transfer to morgue or funeral home.

## 2018-08-17 DEATH — deceased

## 2018-09-17 NOTE — Discharge Summary (Signed)
DISCHARGE SUMMARY    Date of admit: 08-10-18  1:02 AM Date of discharge: 07/29/2018  5:23 AM Length of Stay: 3 days  PCP is Clovis Riley, L.August Saucer, MD   PROBLEM LIST Active Problems: Cirrhosis liver due to alcohol   Sepsis (HCC)    SUMMARY Kathryn [name] was 68 y.o. patient with    has a past medical history of Alcoholic liver disease (HCC), Arthritis, Ascites (09/22/11), Dysrhythmia, Fracture, humerus (07/2011), GERD (gastroesophageal reflux disease), High cholesterol, History of blood transfusion (07/2011), Hypertension, Myocardial infarction (HCC), Osteomyelitis (HCC), Osteoporosis, Pancreatitis (several years ago), Peripheral vascular disease (HCC), and Staph infection.   has a past surgical history that includes Amputation; Bunionectomy; Arthroscopic repair ACL; ORIF humerus fracture (07/26/2011); Total hip arthroplasty (~ 2003); Tubal ligation (1980); Paracentesis (09/22/11); Esophagogastroduodenoscopy (09/23/2011); Transmetatarsal amputation (02/16/2012); Fracture surgery (2013); Joint replacement; Esophagogastroduodenoscopy (egd) with propofol (N/A, 08/22/2012); Leg Surgery (Left); ORIF wrist fracture (Left, 12/26/2014); EUS (N/A, 03/25/2015); and Fine needle aspiration (N/A, 03/25/2015).   Admitted on Aug 10, 2018 with   68 year old female history of alcholic cirrhosis/hepatic encephalopathy presented to ED 10-Aug-2018 with altered mental status, agonal breathing.  She was started with pacing with paramedics and epi drip.  Hypertensive and hypothermic on presentation in ed.  Pacing removed, epi drip d/c.  Became hypotensive, continued AMS.  Lactate 11, wbc 15 with left shift.  Started on vanc, cefipime, flagyl in ed.  Started on levophed.  CT head subtle loss of gray-white diff, suspic for evolving acute ischemic left PCA territory.  Discussed with neurology in ed, likely artifact. Also decompensation per significant other over days; out of window for tpa given chronicity of symptoms.    rec MRI when  metabolic derranagment normalize.  Intubated for airway protection   COURSE 4/11 in renal failure creat 4.47 but better. Baseline  0.76mg % in dec 2018. On vent. On pressors. Bil 7 -> 6. Poor lactate clearance at 4. MELD 36 (> 50% 3 month mortality). Child Pugh - Class C  D.w [name] -she live [place]. Says mom lives with a female room mate who is also an alcoholic. Daughter does not keep in touch with mom due to alcoholic behaviour of patient. There is a son to patient . Daughter said she and he get along well.    07/29/2018 - >  Comatose without sedation on vent. 3 pressors - levophed 40, neo 160, vaso 0.03. Lactic acidosis worse. Renal function better but urine ouput low.. Still on vent . MELD score 39 and worse  ,07/30/2018 - >  Now terminal care. Off pressors. On 20mg /h morphine. Still alive. Comfortable. Making some urine   Patient expired 08/15/2018     SIGNED Dr. Kalman Shan, M.D., F.C.C.P Pulmonary and Critical Care Medicine Staff Physician [facility]  Pulmonary and Critical Care Pager: [phone], If no answer or between  15:00h - 7:00h: call [phone]  08/23/2018 10:00 AM
